# Patient Record
Sex: Female | Born: 1935
Health system: Southern US, Community
[De-identification: ages and names within clinical notes are randomized; demographics above are authoritative.]

## PROBLEM LIST (undated history)

## (undated) DIAGNOSIS — M1990 Unspecified osteoarthritis, unspecified site: Secondary | ICD-10-CM

## (undated) DIAGNOSIS — I639 Cerebral infarction, unspecified: Secondary | ICD-10-CM

## (undated) DIAGNOSIS — F329 Major depressive disorder, single episode, unspecified: Secondary | ICD-10-CM

## (undated) DIAGNOSIS — E7800 Pure hypercholesterolemia, unspecified: Secondary | ICD-10-CM

## (undated) DIAGNOSIS — I10 Essential (primary) hypertension: Secondary | ICD-10-CM

## (undated) DIAGNOSIS — E039 Hypothyroidism, unspecified: Secondary | ICD-10-CM

## (undated) DIAGNOSIS — G459 Transient cerebral ischemic attack, unspecified: Secondary | ICD-10-CM

## (undated) HISTORY — DX: Unspecified osteoarthritis, unspecified site: M19.90

## (undated) HISTORY — DX: Hypothyroidism, unspecified: E03.9

## (undated) HISTORY — DX: Pure hypercholesterolemia, unspecified: E78.00

## (undated) HISTORY — DX: Essential (primary) hypertension: I10

## (undated) HISTORY — PX: DEBRIDEMENT AND CLOSURE WOUND: SHX5614

## (undated) HISTORY — DX: Cerebral infarction, unspecified: I63.9

## (undated) HISTORY — DX: Major depressive disorder, single episode, unspecified: F32.9

---

## 1990-02-24 HISTORY — PX: CATARACT EXTRACTION: SUR2

## 1993-02-24 HISTORY — PX: CATARACT EXTRACTION: SUR2

## 2001-05-18 ENCOUNTER — Other Ambulatory Visit: Admission: RE | Admit: 2001-05-18 | Discharge: 2001-05-18 | Payer: Self-pay | Admitting: Internal Medicine

## 2005-01-08 ENCOUNTER — Emergency Department (HOSPITAL_COMMUNITY): Admission: EM | Admit: 2005-01-08 | Discharge: 2005-01-08 | Payer: Self-pay | Admitting: Emergency Medicine

## 2005-01-10 ENCOUNTER — Inpatient Hospital Stay (HOSPITAL_COMMUNITY): Admission: EM | Admit: 2005-01-10 | Discharge: 2005-01-15 | Payer: Self-pay | Admitting: Emergency Medicine

## 2008-12-18 ENCOUNTER — Emergency Department (HOSPITAL_COMMUNITY): Admission: EM | Admit: 2008-12-18 | Discharge: 2008-12-18 | Payer: Self-pay | Admitting: Family Medicine

## 2009-10-24 ENCOUNTER — Ambulatory Visit: Payer: Self-pay | Admitting: Cardiology

## 2010-02-22 ENCOUNTER — Ambulatory Visit: Payer: Self-pay | Admitting: Cardiology

## 2010-02-27 ENCOUNTER — Ambulatory Visit: Payer: Self-pay | Admitting: Cardiology

## 2010-03-01 ENCOUNTER — Encounter
Admission: RE | Admit: 2010-03-01 | Discharge: 2010-03-01 | Payer: Self-pay | Source: Home / Self Care | Attending: Cardiology | Admitting: Cardiology

## 2010-05-22 ENCOUNTER — Observation Stay (HOSPITAL_COMMUNITY): Payer: Medicare Other

## 2010-05-22 ENCOUNTER — Inpatient Hospital Stay (INDEPENDENT_AMBULATORY_CARE_PROVIDER_SITE_OTHER)
Admission: RE | Admit: 2010-05-22 | Discharge: 2010-05-22 | Disposition: A | Discharge status: Home / Self Care | Payer: Medicare Other | Source: Ambulatory Visit | Attending: Family Medicine | Admitting: Family Medicine

## 2010-05-22 ENCOUNTER — Emergency Department (HOSPITAL_COMMUNITY): Payer: Medicare Other

## 2010-05-22 ENCOUNTER — Inpatient Hospital Stay (HOSPITAL_COMMUNITY)
Admission: EM | Admit: 2010-05-22 | Discharge: 2010-05-24 | DRG: 312 | Disposition: A | Discharge status: Home / Self Care | Payer: Medicare Other | Source: Ambulatory Visit | Attending: Internal Medicine | Admitting: Internal Medicine

## 2010-05-22 ENCOUNTER — Telehealth: Payer: Self-pay | Admitting: *Deleted

## 2010-05-22 DIAGNOSIS — E039 Hypothyroidism, unspecified: Secondary | ICD-10-CM | POA: Diagnosis present

## 2010-05-22 DIAGNOSIS — R55 Syncope and collapse: Principal | ICD-10-CM | POA: Diagnosis present

## 2010-05-22 DIAGNOSIS — E875 Hyperkalemia: Secondary | ICD-10-CM | POA: Diagnosis present

## 2010-05-22 DIAGNOSIS — N390 Urinary tract infection, site not specified: Secondary | ICD-10-CM | POA: Diagnosis present

## 2010-05-22 DIAGNOSIS — F329 Major depressive disorder, single episode, unspecified: Secondary | ICD-10-CM | POA: Diagnosis present

## 2010-05-22 DIAGNOSIS — M1990 Unspecified osteoarthritis, unspecified site: Secondary | ICD-10-CM | POA: Diagnosis present

## 2010-05-22 DIAGNOSIS — N179 Acute kidney failure, unspecified: Secondary | ICD-10-CM | POA: Diagnosis present

## 2010-05-22 DIAGNOSIS — I10 Essential (primary) hypertension: Secondary | ICD-10-CM | POA: Diagnosis present

## 2010-05-22 DIAGNOSIS — Z9181 History of falling: Secondary | ICD-10-CM

## 2010-05-22 DIAGNOSIS — F3289 Other specified depressive episodes: Secondary | ICD-10-CM | POA: Diagnosis present

## 2010-05-22 DIAGNOSIS — T46905A Adverse effect of unspecified agents primarily affecting the cardiovascular system, initial encounter: Secondary | ICD-10-CM | POA: Diagnosis present

## 2010-05-22 DIAGNOSIS — N289 Disorder of kidney and ureter, unspecified: Secondary | ICD-10-CM | POA: Diagnosis present

## 2010-05-22 DIAGNOSIS — K5900 Constipation, unspecified: Secondary | ICD-10-CM | POA: Diagnosis present

## 2010-05-22 LAB — DIFFERENTIAL
Basophils Absolute: 0 10*3/uL (ref 0.0–0.1)
Basophils Relative: 0 % (ref 0–1)
Eosinophils Absolute: 0 10*3/uL (ref 0.0–0.7)
Eosinophils Relative: 0 % (ref 0–5)
Lymphocytes Relative: 9 % — ABNORMAL LOW (ref 12–46)
Lymphs Abs: 1.1 10*3/uL (ref 0.7–4.0)
Monocytes Absolute: 0.9 10*3/uL (ref 0.1–1.0)
Monocytes Relative: 8 % (ref 3–12)
Neutro Abs: 10.1 10*3/uL — ABNORMAL HIGH (ref 1.7–7.7)
Neutrophils Relative %: 84 % — ABNORMAL HIGH (ref 43–77)

## 2010-05-22 LAB — TROPONIN I: Troponin I: 0.01 ng/mL (ref 0.00–0.06)

## 2010-05-22 LAB — CARDIAC PANEL(CRET KIN+CKTOT+MB+TROPI)
Relative Index: INVALID (ref 0.0–2.5)
Total CK: 58 U/L (ref 7–177)

## 2010-05-22 LAB — URINE MICROSCOPIC-ADD ON

## 2010-05-22 LAB — BASIC METABOLIC PANEL
BUN: 29 mg/dL — ABNORMAL HIGH (ref 6–23)
CO2: 21 mEq/L (ref 19–32)
Calcium: 9.9 mg/dL (ref 8.4–10.5)
Chloride: 101 mEq/L (ref 96–112)
Creatinine, Ser: 1.48 mg/dL — ABNORMAL HIGH (ref 0.4–1.2)
GFR calc non Af Amer: 34 mL/min — ABNORMAL LOW (ref >60)
Glucose, Bld: 98 mg/dL (ref 70–99)
Potassium: 5.2 mEq/L — ABNORMAL HIGH (ref 3.5–5.1)
Sodium: 133 mEq/L — ABNORMAL LOW (ref 135–145)

## 2010-05-22 LAB — CBC
HCT: 37.2 % (ref 36.0–46.0)
Hemoglobin: 12.5 g/dL (ref 12.0–15.0)
MCH: 29.3 pg (ref 26.0–34.0)
MCHC: 33.6 g/dL (ref 30.0–36.0)
MCV: 87.3 fL (ref 78.0–100.0)
RDW: 13.5 % (ref 11.5–15.5)
WBC: 12.1 10*3/uL — ABNORMAL HIGH (ref 4.0–10.5)

## 2010-05-22 LAB — URINALYSIS, ROUTINE W REFLEX MICROSCOPIC
Bilirubin Urine: NEGATIVE
Glucose, UA: NEGATIVE mg/dL
Hgb urine dipstick: NEGATIVE
Nitrite: NEGATIVE
Protein, ur: NEGATIVE mg/dL
Specific Gravity, Urine: 1.018 (ref 1.005–1.030)
Urobilinogen, UA: 0.2 mg/dL (ref 0.0–1.0)
pH: 5 (ref 5.0–8.0)

## 2010-05-22 NOTE — Telephone Encounter (Signed)
Patient sister-in-law phoned stating that patient had an "episode".  Patient got up and ate breakfast went to bedroom to get dressed.  Was putting on her pants and doesn't really know what happened then.  Asked if she had any pains, shortness of breath, or anything else, denied.  Advised she needed to go to emergency room/urgent care (per Lawson Fiscal).  Advised sister-in-law to call back if she refused to go

## 2010-05-22 NOTE — Telephone Encounter (Signed)
Agree 

## 2010-05-23 ENCOUNTER — Other Ambulatory Visit: Payer: Self-pay | Admitting: Internal Medicine

## 2010-05-23 DIAGNOSIS — R55 Syncope and collapse: Secondary | ICD-10-CM

## 2010-05-23 LAB — CBC
HCT: 34.1 % — ABNORMAL LOW (ref 36.0–46.0)
Hemoglobin: 11.3 g/dL — ABNORMAL LOW (ref 12.0–15.0)
MCH: 28.8 pg (ref 26.0–34.0)
MCHC: 33.1 g/dL (ref 30.0–36.0)
MCV: 87 fL (ref 78.0–100.0)
RBC: 3.92 MIL/uL (ref 3.87–5.11)
RDW: 13.4 % (ref 11.5–15.5)

## 2010-05-23 LAB — BASIC METABOLIC PANEL
BUN: 24 mg/dL — ABNORMAL HIGH (ref 6–23)
CO2: 22 mEq/L (ref 19–32)
Calcium: 9.6 mg/dL (ref 8.4–10.5)
Chloride: 101 mEq/L (ref 96–112)
Creatinine, Ser: 1.25 mg/dL — ABNORMAL HIGH (ref 0.4–1.2)
GFR calc Af Amer: 49 mL/min — ABNORMAL LOW (ref >60)
GFR calc non Af Amer: 41 mL/min — ABNORMAL LOW (ref >60)
Glucose, Bld: 117 mg/dL — ABNORMAL HIGH (ref 70–99)
Potassium: 4 mEq/L (ref 3.5–5.1)
Sodium: 137 mEq/L (ref 135–145)

## 2010-05-23 LAB — MAGNESIUM: Magnesium: 2.1 mg/dL (ref 1.5–2.5)

## 2010-05-23 LAB — CARDIAC PANEL(CRET KIN+CKTOT+MB+TROPI)
CK, MB: 2.3 ng/mL (ref 0.3–4.0)
CK, MB: 2.1 ng/mL (ref 0.3–4.0)
Relative Index: INVALID (ref 0.0–2.5)
Total CK: 77 U/L (ref 7–177)
Troponin I: 0.03 ng/mL (ref 0.00–0.06)
Troponin I: 0.02 ng/mL (ref 0.00–0.06)

## 2010-05-23 LAB — URINE CULTURE
Colony Count: 7000
Culture  Setup Time: 201203282059

## 2010-05-23 LAB — TSH: TSH: 1.837 u[IU]/mL (ref 0.350–4.500)

## 2010-05-24 ENCOUNTER — Other Ambulatory Visit: Payer: Self-pay | Admitting: Internal Medicine

## 2010-05-24 DIAGNOSIS — I059 Rheumatic mitral valve disease, unspecified: Secondary | ICD-10-CM

## 2010-05-24 LAB — BASIC METABOLIC PANEL
Calcium: 9.3 mg/dL (ref 8.4–10.5)
Creatinine, Ser: 0.93 mg/dL (ref 0.4–1.2)
GFR calc Af Amer: >60 mL/min (ref >60)
GFR calc non Af Amer: 57 mL/min — ABNORMAL LOW (ref >60)
Sodium: 138 mEq/L (ref 135–145)

## 2010-05-24 LAB — CBC
HCT: 31.6 % — ABNORMAL LOW (ref 36.0–46.0)
Hemoglobin: 10.4 g/dL — ABNORMAL LOW (ref 12.0–15.0)
MCH: 28.8 pg (ref 26.0–34.0)
MCHC: 32.9 g/dL (ref 30.0–36.0)
MCV: 87.5 fL (ref 78.0–100.0)
Platelets: 144 10*3/uL — ABNORMAL LOW (ref 150–400)
RBC: 3.61 MIL/uL — ABNORMAL LOW (ref 3.87–5.11)
RDW: 13.5 % (ref 11.5–15.5)
WBC: 6 10*3/uL (ref 4.0–10.5)

## 2010-05-27 NOTE — H&P (Signed)
NAME:  Danielle Bryan, Danielle Bryan                  ACCOUNT NO.:  [id number]  MEDICAL RECORD NO.:  [id number]          PATIENT TYPE:  OBV  LOCATION:                               FACILITY:  [facility]  PHYSICIAN:  Eduard Clos, MDDATE OF BIRTH:  [birth year]  DATE OF ADMISSION: DATE OF DISCHARGE:                             HISTORY & PHYSICAL   PRIMARY CARE PHYSICIAN:  Cassell Clement, MD.  CHIEF COMPLAINT:  Loss of consciousness.  HISTORY OF PRESENT ILLNESS:  An 77 year old female with history of hypothyroidism, hypertension, and depression.  Had sudden syncopal spell around 2:30 this afternoon.  The patient states that she was doing fine. She has been having some constipation and was straining to move her bowels.  Today after she did move her bowels around afternoon and went to sit in the bedroom and was putting the socks, suddenly she felt unconscious.  She states it may be for a few minutes.  She did not have any incontinence of urine or tongue bite.  Did not have any seizure-like activities.  Denies any focal deficit, headache, or visual symptoms prior or after the incident.  Denies any chest pain or shortness of breath.  Denies any nausea, vomiting, abdominal pain, dysuria, discharge, or diarrhea. At this time, the patient has been admitted for syncope workup.  Presently, her creatinine is slightly high and potassium was also mildly high.  Her troponins are negative.  The patient has mild leukocytosis with features of UTI.  PAST MEDICAL HISTORY:  Hypertension, hypothyroidism, depression.  PAST SURGICAL HISTORY:  None except for a D and C in 58s.  FAMILY HISTORY:  Significant for CHF and angina in her mom.  SOCIAL HISTORY:  The patient is married, lives with her husband.  She is a full code.  Denies smoking cigarettes, drinking alcohol, use illegal drugs.  REVIEW OF SYSTEMS:  As per the history of presenting illness, nothing else significant.  PHYSICAL EXAMINATION:  GENERAL:  The  patient was examined at the bedside, not in acute distress. VITAL SIGNS:  Blood pressure is 125/70, pulse is 70 per minute, temperature 97.7, respirations 18 per minute, O2 sat 100%. HEENT:  Anicteric.  No pallor.  No facial asymmetry.  I do not see any scab lesions or laceration.  No neck rigidity.  PERRLA positive.  Tongue is midline. CHEST:  Bilateral air entry present.  No rhonchi.  No crepitation. HEART:  S1 and S2 heard. ABDOMEN:  Soft, nontender.  Bowel sounds heard. CNS:  Awake, alert, and oriented to time, place, and person.  Moves upper and lower extremities 5/5. EXTREMITIES:  Peripheral pulse was felt.  There is a small band-aid on the right knee from a recent corticosteroid injection for osteoarthritis.  LABS:  EKG shows normal sinus rhythm with a heart rate around 66 beats per minute with nonspecific ST-T changes.  QTc is 400 milliseconds. CBC:  WBC is 12.1, hemoglobin is 12.5, hematocrit is 37.2, platelets 167, neutrophils 84%.  Basic metabolic panel:  Sodium 133, potassium 5.2, chloride 101, carbon dioxide 21, glucose 98, BUN is 29, creatinine 1.4, calcium 9.9, troponin 0.01.  UA  shows cloudy, nitrites negative, leukocytes moderate, squamous cells few, hyaline casts, wbc 7-10, bacteria few, mucus present.  Chest x-ray, no active cardiopulmonary disease.  ASSESSMENT: 1. Syncope. 2. Urinary tract infection. 3. Renal failure with mild hyperkalemia. 4. History of hypertension. 5. History of hypothyroidism. 6. History of osteoarthritis of the right knee, status post recent     corticosteroid injection.  PLAN: 1. At this time, we will admit the patient to telemetry to rule out     any arrhythmias. 2. For her syncope at this time, we are going to check orthostatics in     a.m.  Get 2-D echo.  We will get a CT head, serum cardiac markers.     We are going to gently hydrate the patient. 3. Renal failure, probably acute.  We are going to gently hydrate the     patient.   At this time, we are going to hold the lisinopril as the     patient also has mild hyperkalemia.  We will follow strict intake     output.  If her creatinine worsens, then we need to get a sonogram     of abdomen; check urine sodium, creatinine, and eosinophils.  At     this time, for her blood pressure, I am going to keep the patient     on p.r.n. hydralazine.  If the hyperkalemia worsens, then the     patient may not be a candidate for continuing lisinopril; we have     to consider some other antihypertensive. 4. Hypothyroidism.  We will check TSH.  Continue her present Synthroid     dose. 5. For her UTI, we will get urine culture.  We will continue with     ceftriaxone. 6. Further recommendation as condition evolves and based on the test     orders.     Eduard Clos, MD     ANK/MEDQ  D:  05/22/2010  T:  05/22/2010  Job:  [id number]  cc:   Cassell Clement, M.D.  Electronically Signed by Midge Minium MD on 05/27/2010 08:26:04 AM

## 2010-05-29 NOTE — Discharge Summary (Signed)
NAMEFERRIN, LIEBIG                  ACCOUNT NO.:  [id number]  MEDICAL RECORD NO.:  [id number]           PATIENT TYPE:  O  LOCATION:  2009                         FACILITY:  [facility]  PHYSICIAN:  Thad Ranger, MD       DATE OF BIRTH:  1923/03/10  DATE OF ADMISSION:  05/22/2010 DATE OF DISCHARGE:                        DISCHARGE SUMMARY - REFERRING   PRIMARY CARE PHYSICIAN:  Cassell Clement, MD  DISCHARGE DIAGNOSES: 1. Syncope likely vasovagal and dehydration. 2. Mild acute renal insufficiency with mild hyperkalemia, likely     secondary to lisinopril. 3. Hypertension. 4. Hypothyroidism. 5. History of osteoarthritis.  DISCHARGE MEDICATIONS: 1. Norvasc 5 mg p.o. daily. 2. Aspirin 81 mg p.o. daily. 3. Levothyroxine 100 mcg on Monday, Tuesday, Wednesday, Thursday,     Friday, and Saturday. 4. Protonix 40 mg p.o. q.a.m. 5. Sertraline 25 mg p.o. b.i.d. 6. Vitamin D3 1000 units p.o. daily.  The patient was advised to stop taking the following medication which include lisinopril.  BRIEF HISTORY OF PRESENT ILLNESS:  At the time of admission, Danielle Bryan is 77 year old female with history of hypothyroidism, hypertension, and depression fell around on the day of admission around 2:30 p.m.  The patient stated that she was doing fine.  She had been having some constipation, was straining to move her bowels.  After she did move her bowels around afternoon, went to sit in the bedroom and was pulling the shocks and she felt unconscious.  She states that it may be for a few minutes.  She did not have any incontinence of urine or tongue bite, did not have any seizure-like activities.  Denied any focal deficits, headache, or visual symptoms.  Prior or off to the incident, denied any chest pain or shortness of breath.  RADIOLOGICAL DATA:  Chest x-ray March 28, no active cardiopulmonary disease.  CT head without contrast March 29, trophy with minimal small vessel chronic ischemic changes of  deep cerebral white matter.  No acute intracranial abnormalities.  A 2-D echo on March 30 showed EF of 60-65% normal wall motion.  No regional wall motion abnormalities, grade 1 diastolic dysfunction.  Carotid Doppler's bilaterally on March 29 showed no ICA stenosis.  BRIEF HOSPITALIZATION COURSE:  [name] is an 77 year old female who was admitted with syncopal episode. 1. Syncopal episode likely vasovagal.  The patient was admitted to the     tele monitor unit and ruled out for any acute arrhythmias.  The     patient had uneventful hospitalization and did extremely well.  She     was noted to have mild hyperkalemia with a potassium of 5.2 and     acute renal insufficiency with creatinine of 1.4 at the time of     admission.  She was gently hydrated with IV fluids and lisinopril     was held.  At the time of discharge, potassium improved to 4.8 and     creatinine 0.93.  The patient was advised to stop taking     lisinopril.  Urine culture had shown 7000 colonies insignificant     growth.  The patient had received  3 doses of ceftriaxone during    hospitalization. 2. Hyperthyroidism.  TSH was within normal range of 1.8.  The patient     was continued on Synthroid. 3. Depression.  The patient did well and was continued on sertraline. 4. Hypertension.  Lisinopril was discontinued and the patient was     started on 5 mg of Norvasc, she did well. 5. PT/OT evaluation was done prior to discharge and the patient did     extremely well and does not need any OT or PT needs at home.  DISCHARGE PHYSICAL EXAMINATION:  VITAL SIGNS:  Temperature 97.9, pulse 74, respirations 18, O2 sat 98% room air, blood pressure 119/59. GENERAL:  The patient is alert, awake and oriented x3 not in acute distress. HEENT:  Negative sclerae and conjunctivae.  Pupils reactive to light and accommodation.  EOMI. NECK:  Supple.  No lymphopathy, no JVD. CARDIOVASCULAR:  S1, S2 clear. CHEST:  Clear to auscultation  bilaterally. ABDOMEN:  Soft, nontender, nondistended.  Normal bowel sounds. EXTREMITIES:  No cyanosis, clubbing, or edema noted in upper or lower extremities bilaterally. NEURO:  No focal neurological deficits noted.  The patient is discharged.  Follow up with Dr. Cassell Clement within next 2-3 weeks.  DISCHARGE TIME:  35 minutes.     Thad Ranger, MD     RR/MEDQ  D:  05/24/2010  T:  05/24/2010  Job:  [id number]  cc:   Cassell Clement, M.D.  Electronically Signed by Andres Labrum RAI  on 05/29/2010 04:45:11 PM

## 2010-06-06 ENCOUNTER — Telehealth: Payer: Self-pay | Admitting: Cardiology

## 2010-06-06 NOTE — Telephone Encounter (Signed)
Requesting copies of the records of the testing that was done at the hospital and discharge summary, cat scan,echo, ultrasound, doppers of arteries. I have pulled the chart.

## 2010-06-08 ENCOUNTER — Other Ambulatory Visit: Payer: Self-pay | Admitting: Cardiology

## 2010-06-08 DIAGNOSIS — K219 Gastro-esophageal reflux disease without esophagitis: Secondary | ICD-10-CM

## 2010-06-10 NOTE — Telephone Encounter (Signed)
Refill request

## 2010-06-14 ENCOUNTER — Encounter: Payer: Self-pay | Admitting: Cardiology

## 2010-06-21 ENCOUNTER — Telehealth: Payer: Self-pay | Admitting: Cardiology

## 2010-06-21 NOTE — Telephone Encounter (Signed)
Wanted copies of her ultra sound, CT scan, dopplers of the corodid, and the echo, and a copy of the discharge summary from the hospital.

## 2010-07-12 NOTE — H&P (Signed)
Danielle Bryan, Danielle Bryan                  ACCOUNT NO.:  [id number]   MEDICAL RECORD NO.:  [id number]          PATIENT TYPE:  INP   LOCATION:  1845                         FACILITY:  [facility]   PHYSICIAN:  Cassell Clement, M.D. DATE OF BIRTH:  1923-09-11   DATE OF ADMISSION:  01/10/2005  DATE OF DISCHARGE:                                HISTORY & PHYSICAL   CHIEF COMPLAINT:  Recurrent syncope.   HISTORY:  This is a 77 year old married Caucasian female known to our  practice. She has been in good general health all of her life. Recently, she  has been under increased stress. Her husband, who suffers from diabetes and  dementia, was hospitalized several days ago at [facility] under the  hospitalist service because of a probable viral illness with  gastroenteritis. Two days ago while Danielle Bryan was up helping to feed her  husband lunch in the hospital, she became dizzy and had syncope. She was  brought to the emergency room where she was evaluated, given IV fluids, and  a CBC was checked and she was released home. The following morning, the  patient had syncope again and was taken to our office where we checked her  and her vital signs were stable, CBC was stable, electrolytes stable except  for sodium of 127, and her blood pressure was 120/60. She had been on Diovan  HCT on a daily basis for blood pressure and we instructed her to stop the  Diovan HCT and to force fluids, and to eat salt and drink Gatorade. She went  home and slept well last night, and then this morning shortly after being  awakened from sleep she had two more episodes of syncope according to the  son, who then brought her to the emergency room where she was evaluated and  subsequently admitted. She had had a large diarrheal stool prior to her  initial episode of syncope. Since then, she has had no further significant  diarrheal stool but did have questionable small amount of loose stool  incontinently during the night last  night. The patient denies any abdominal  pain or chest pain. She is not having any shortness of breath. She does  complain of just extreme exhaustion and fatigue.   PAST HISTORY:  Positive for essential hypertension and hyperlipidemia and  hypothyroidism. Present medications had been Diovan HCT which is now on  hold, and she is also on Synthroid 0.1 mg 6 days a week and that was  recently reduced to 5 days a week. She has Protonix on hand for p.r.n. use.   FAMILY HISTORY:  Reveals that her father died at 72 of heart failure. Mother  died of 70 of heart failure and pneumonia.   SOCIAL HISTORY:  Reveals that she retired as the [employer] for [employer] last year.   She has had fairly unremarkable past medical history. She did have a D&C in  1951 by Dr. Shea Evans. She has had no other significant health problems except as  noted above. She is allergic to SULFA. She does not use tobacco and very  rarely drinks any alcohol. She limits her caffeine.   REVIEW OF SYSTEMS:  Otherwise unremarkable. She has not had any cough or  sputum production and she is not having any urinary tract symptoms.   PHYSICAL EXAMINATION:  VITAL SIGNS:  Are as recorded.  GENERAL APPEARANCE:  Reveals a pale-appearing elderly Caucasian female in no  acute distress. She was napping when I entered the room.  HEENT:  She has normal HEENT exam. Her conjunctiva are slightly pale but  there is no scleral icterus. Mouth and pharynx are normal.  NECK:  The thyroid is normal. There is no lymphadenopathy. Carotids reveal  no bruits.  CHEST:  Clear to percussion and auscultation.  HEART:  Reveals no murmur, gallop, rub or click.  BREASTS:  Not examined.  ABDOMEN:  Soft and nontender. Bowel sounds are present.  EXTREMITIES:  Show no phlebitis or edema.  NEUROLOGIC:  Exam is physiologic.   Orthostatic vital signs include a blood pressure lying of 127/94, sitting of  134/62. Her white count is 8600; hemoglobin 11.9;  hematocrit 34.2; platelets  153,000. Neutrophils are 92%. Sodium is 127, potassium 3.2, BUN 19, blood  sugar 118, creatinine 1.2. Urinalysis is unremarkable. No evidence of  urinary infection with negative nitrite, although many bacteria are present.  Comprehensive metabolic panel included normal liver function studies. She  had a troponin I which was normal. Her total CK is elevated at 592 but MB is  1.6.   Her electrocardiogram done in our office is normal. Her chest x-ray done  here today is unremarkable.   IMPRESSION:  1.  Recurrent syncope.  2.  Hyponatremia.  3.  Mild anemia.  4.  Situational stress with her husband in the hospital.  5.  Compensated hypothyroidism.   DISPOSITION:  We are admitting for IV fluids, correction of electrolyte  imbalance, supplementation of potassium, and observation on telemetry. Will  also want to follow up on the elevated total CK with a normal CK-MB.           ______________________________  Cassell Clement, M.D.     TB/MEDQ  D:  01/10/2005  T:  01/10/2005  Job:  [id number]

## 2010-07-12 NOTE — Discharge Summary (Signed)
NAMEAJOONI, KARAM                  ACCOUNT NO.:  [id number]   MEDICAL RECORD NO.:  [id number]          PATIENT TYPE:  INP   LOCATION:  2033                         FACILITY:  [facility]   PHYSICIAN:  Cassell Clement, M.D. DATE OF BIRTH:  1923-04-23   DATE OF ADMISSION:  01/10/2005  DATE OF DISCHARGE:  01/15/2005                                 DISCHARGE SUMMARY   FINAL DIAGNOSES:  1.  Syncope.  2.  Hyposmolarity.  3.  Hypertension.  4.  Hyperlipidemia.  5.  Anemia.  6.  Hypothyroidism.  7.  Malaise and fatigue.  8.  Depression.   OPERATIONS PERFORMED:  None.   HISTORY:  This is a 77 year old Caucasian female known to our practice. She  had been in good general health most of her life and was admitted because of  recurrent syncope January 10, 2005. Recently, she has been under increased  stress. Two days prior to this admission, the patient was helping to feed  her husband who is ill and she became dizzy and had syncope and was brought  to the emergency room where she was evaluated, given IV fluids, had a CBC  checked which was normal and she was released home. The following morning,  the patient had another episode of syncope, was brought to our office were  we checked her and her vital signs were stable. CBC was stable except for a  hyponatremia with a sodium of 127. Her blood pressure was 120/60. She was  advised to stop taking her Diovan/HCT and was advised to force fluids, eat  salt and drink some Gatorade. She went home, slept well and then this  morning shortly after being awakened she had two more episodes of syncope  according to the son who brought her to the emergency room where she was  evaluated and admitted. Prior to the syncopal episode, she had a large  diarrheal stool, but since then has had no further diarrheal stools. She is  not having any shortness of breath but she complains of extreme exhaustion  and fatigue.   MEDICATIONS PRIOR TO THIS ADMISSION:   Included Diovan/HCT which had been on  hold for one day and she has been on Synthroid 0.1 milligrams 6 days a week  and Protonix on hand for p.r.n. use.   PHYSICAL EXAMINATION:  Reveals vital signs as recorded. This is a pale-  appearing elderly woman in no distress. HEENT exam unremarkable. Thyroid  normal. Carotids no bruits. Chest is clear to percussion and auscultation.  The heart reveals no murmur, gallop, rub or click. Abdomen is soft and  nontender. Bowel sounds were present. Extremities show no phlebitis or  edema. Neurologic exam is physiologic. Her orthostatic vital signs included  blood pressure lying of 127/94, sitting 134/62.   On admission, her sodium is 127, potassium 3.2, BUN 19, current blood sugar  118, creatinine 1.2. Her urinalysis is unremarkable with negative nitrite,  although many bacteria are present. Her liver function studies are normal  and her total CK was elevated at 592 but MB is 1.6.   HOSPITAL COURSE:  She was admitted for IV fluids and correction of  electrolyte imbalance, supplemental of potassium and observation on  telemetry because of the recurrent episodes of syncope. We will also want  follow-up on the elevated total CK with a normal CK-MB.   The patient remained stable on telemetry. By January 11, 2005, she was  still hyponatremic with a sodium of 128 and a potassium down to 2.9. Her  total CK showed a gradual fall from 592-447 by the following day. The stools  were cultured for C. difficile to evaluate for the diarrhea. The question of  gallbladder disease was raised and an ultrasound was ordered. It was felt  that the patient was probably depressed and stated that she was intolerant  to Lexapro and we gave her a trial of Zoloft. The patient by January 13, 2005 appeared to be slowly improving after rehydration and her weight was  increased eight pounds since admission. Hemoglobin fell to 10.2 after being  12.6 on admission, after she was  rehydrated. Therefore, because of the  anemia, we checked her stool for occult blood. Her BUN dropped to three  consistent with IV saline infusion and poor protein intake. Her serum  albumin was noted to be low at 2.6. We added Ensure and asked the  nutritionist to see and make recommendations. The CT of the abdomen and  pelvis were done because of the weight loss, malaise and fatigue. A CT of  the abdomen and pelvis were negative for malignancy.   By January 14, 2005, the patient was feeling stronger. Blood pressure was  up to 163/84. The patient had further diarrhea twice on the morning of  January 14, 2005 and C. difficile assay was requested. The stool for occult  blood came back normal. Serum iron, TIBC, folate, B12 levels were all okay  and thyroid function was normal. By January 15, 2005, she was strong enough  to be discharged home. The patient was seen by physical therapy prior to  discharge and they recommended home health physical therapy follow-up.   By January 15, 2005, the patient was stable enough to be discharged home.   DISCHARGE MEDICATIONS:  1.  Ferrous sulfate 325 one daily.  2.  Coated aspirin 81 milligrams daily.  3.  Zoloft 25 milligrams daily.  4.  Therapeutic multivitamin one daily.  5.  Ensure 1 can daily.  6.  Tylenol as needed for pain.  7.  Synthroid 100 mcg daily except none on Wednesday and Sunday.   She will see Dr. Patty Sermons in two weeks for office visit, CBC and CMET.   CONDITION ON DISCHARGE:  Improved. She was advised to use a cane for  balance. She has no dietary restriction.           ______________________________  Cassell Clement, M.D.     TB/MEDQ  D:  02/27/2005  T:  02/27/2005  Job:  [id number]

## 2010-08-14 ENCOUNTER — Telehealth: Payer: Self-pay | Admitting: Cardiology

## 2010-08-14 NOTE — Telephone Encounter (Signed)
Called in today wanting to speak with Danielle Bryan or Danielle Bryan. Did not with to disclose the nature of the call to me at first, but later, expressed her displeasure with not being contacted for her next appointment and not being able to receive her hospital records from either [place] or the hospital.  Despite my advice to continue to request her hospital records from the hospital and attempt to schedule her next appointment, she said that it would be in her best interest to speak with either Danielle Bryan or [name] first. Please call back. Also requested that I pull her chart to go along with her note. I have pulled her chart.

## 2010-08-16 ENCOUNTER — Encounter: Payer: Self-pay | Admitting: *Deleted

## 2010-08-16 DIAGNOSIS — M1990 Unspecified osteoarthritis, unspecified site: Secondary | ICD-10-CM | POA: Insufficient documentation

## 2010-08-16 DIAGNOSIS — F329 Major depressive disorder, single episode, unspecified: Secondary | ICD-10-CM | POA: Insufficient documentation

## 2010-08-16 DIAGNOSIS — E7800 Pure hypercholesterolemia, unspecified: Secondary | ICD-10-CM | POA: Insufficient documentation

## 2010-08-16 DIAGNOSIS — E039 Hypothyroidism, unspecified: Secondary | ICD-10-CM | POA: Insufficient documentation

## 2010-08-20 ENCOUNTER — Encounter: Payer: Self-pay | Admitting: Cardiology

## 2010-08-20 ENCOUNTER — Ambulatory Visit (INDEPENDENT_AMBULATORY_CARE_PROVIDER_SITE_OTHER): Payer: Medicare Other | Admitting: Cardiology

## 2010-08-20 DIAGNOSIS — F341 Dysthymic disorder: Secondary | ICD-10-CM

## 2010-08-20 DIAGNOSIS — F329 Major depressive disorder, single episode, unspecified: Secondary | ICD-10-CM

## 2010-08-20 DIAGNOSIS — E7800 Pure hypercholesterolemia, unspecified: Secondary | ICD-10-CM

## 2010-08-20 DIAGNOSIS — I119 Hypertensive heart disease without heart failure: Secondary | ICD-10-CM

## 2010-08-20 NOTE — Progress Notes (Signed)
Danielle Bryan Date of Birth:  1923-09-01 [facility] Cardiology / [facility] [address]   [address] [place], [place]  [address] [phone]           Fax   [phone]  History of Present Illness: This pleasant 77 year old woman is seen for a scheduled followup office visit.  She has a past history of essential hypertension and hypercholesterolemia and hypothyroidism.  Recently she has been incapacitated by symptoms of depression.  Her husband died 10 months ago.  He had been sick for many years and she devoted her life to looking after him and caring for his medical problems.  Patient is having a lot of crying spells and difficulty getting motivated to do anything.  She does sleep okay during the night.  He was hospitalized in March 2012 with syncope and dehydration.  During that admission she had normal carotid Dopplers, normal tone a two-dimensional echocardiogram, and normal CT head scan.  Her lisinopril was stopped her in admission.  He has not been expressing any chest discomfort or shortness of breath.  She has occasional ankle edema which goes down overnight.  She does have chronic pain in her right knee and has seen Dr. Yisroel Ramming.  Current Outpatient Prescriptions  Medication Sig Dispense Refill  . amLODipine (NORVASC) 5 MG tablet 5 mg daily.       Marland Kitchen aspirin 81 MG tablet Take 81 mg by mouth daily.        . Cholecalciferol (VITAMIN D PO) Take by mouth. Taking 1000 daily       . levothyroxine (SYNTHROID, LEVOTHROID) 100 MCG tablet Take 100 mcg by mouth daily. Taking 6 days a week       . Naproxen Sodium (ALEVE PO) Take by mouth. As needed       . Omega-3 Fatty Acids (FISH OIL PO) Take by mouth 2 (two) times daily.        . pantoprazole (PROTONIX) 40 MG tablet TAKE 1 TABLET BY MOUTH EVERY DAY  30 tablet  8  . sertraline (ZOLOFT) 25 MG tablet Take 25 mg by mouth 2 (two) times daily.        Marland Kitchen DISCONTD: HYDROcodone-acetaminophen (VICODIN) 5-500 MG per tablet Take 1 tablet by mouth every 6  (six) hours as needed.        Marland Kitchen DISCONTD: lisinopril (PRINIVIL,ZESTRIL) 10 MG tablet Take 10 mg by mouth 2 (two) times daily.          Allergies  Allergen Reactions  . Lexapro     Ha,nausea  . Statins     myalgias    Patient Active Problem List  Diagnoses  . Hypothyroidism  . Osteoarthritis  . Hypercholesterolemia  . Reactive depression (situational)  . Benign hypertensive heart disease without heart failure    History  Smoking status  . Never Smoker   Smokeless tobacco  . Not on file    History  Alcohol Use     Family History  Problem Relation Age of Onset  . Heart failure Mother   . Pneumonia Mother   . Heart failure Father     Review of Systems: Constitutional: no fever chills diaphoresis or fatigue or change in weight.  Head and neck: no hearing loss, no epistaxis, no photophobia or visual disturbance. Respiratory: No cough, shortness of breath or wheezing. Cardiovascular: No chest pain peripheral edema, palpitations. Gastrointestinal: No abdominal distention, no abdominal pain, no change in bowel habits hematochezia or melena. Genitourinary: No dysuria, no frequency, no urgency, no  nocturia. Musculoskeletal:No arthralgias, no back pain, no gait disturbance or myalgias. Neurological: No dizziness, no headaches, no numbness, no seizures, no syncope, no weakness, no tremors. Hematologic: No lymphadenopathy, no easy bruising. Psychiatric: No confusion, no hallucinations, no sleep disturbance.    Physical Exam: Filed Vitals:   08/20/10 1430  BP: 150/70  Pulse: 88  The general appearance reveals a elderly woman who appears to be depressed and is on the verge of tears.The head and neck exam reveals pupils equal and reactive.  Extraocular movements are full.  There is no scleral icterus.  The mouth and pharynx are normal.  The neck is supple.  The carotids reveal no bruits.  The jugular venous pressure is normal.  The  thyroid is not enlarged.  There is no  lymphadenopathy.  The chest is clear to percussion and auscultation.  There are no rales or rhonchi.  Expansion of the chest is symmetrical.  The precordium is quiet.  The first heart sound is normal.  The second heart sound is physiologically split.  There is no murmur gallop rub or click.  There is no abnormal lift or heave.The breasts reveal no masses. The abdomen is soft and nontender.  The bowel sounds are normal.  The liver and spleen are not enlarged.  There are no abdominal masses.  There are no abdominal bruits.  Extremities reveal good pedal pulses.  There is no phlebitis or edema.  There is no cyanosis or clubbing.  Strength is normal and symmetrical in all extremities.  There is no lateralizing weakness.  There are no sensory deficits.  The skin is warm and dry.  There is no rash.   Assessment / Plan: Long discussion regarding management of herSymptoms of situational depression.  She declines psychiatric referral at this time.  She will consider her looking into the possibility of a possible move to a place like [place] [facility] Would provided opportunity for socialization.No change in medication at this point.  She knows that if she fails to improve that the invitation for psychiatric referral is always Open.  Recheck in 3 months for followup office visit and lab

## 2010-08-20 NOTE — Assessment & Plan Note (Signed)
Patient has a past history of hypercholesterolemia.She is intolerant of statins because of myalgias.  She's attempting to control her cholesterol with diet and she is avoiding eating meats and cheeses and ice cream etc.

## 2010-08-20 NOTE — Assessment & Plan Note (Signed)
The patient has a past history of essential hypertension.  She previously had been on lisinopril twice a day.  However she was admitted with syncope and dehydration on 05/22/10 and had transient hyperkalemia and transient azotemia with serum creatinine of 1.48 and her lisinopril was stopped and not restarted.  At discharge her BUN was 14 and her creatinine was 0.93.  He has not been experiencing any headaches.  She's not having any history of congestive heart failure

## 2010-08-20 NOTE — Assessment & Plan Note (Signed)
This patient is seen for a followup office visit.  She has a past history of reactive situational depression.  Her husband died 10 months ago.  She has continued to be very depressed.  She is depressed about her financial situation and also about the dark depressing home that she is living in.  She cries for hours at a time.  She stays by herself.  She does not get out and socialize.  She doesn't walk in the neighborhood because of the traffic situation.  She has been on Zoloft without much improvement.  We have tried Lexapro in the past but she did not tolerate it because of headache and nausea.  I told her that I thought that she was depressed and she agreed.  I told her that I thought she should see a psychiatrist and we will be glad to refer her to one there would be a good match for her but she declined referral.  She did agree that she would try to get out of her house more.  I also suggested that she should really consider looking into moving out of that house and selling it and moving to a place like [place] where she would have some people available for companion ship and socialization.  She promised she would look into this.  It may be feasible for her with her financial situation.

## 2010-09-13 ENCOUNTER — Other Ambulatory Visit: Payer: Self-pay | Admitting: Cardiology

## 2010-09-13 DIAGNOSIS — I119 Hypertensive heart disease without heart failure: Secondary | ICD-10-CM

## 2010-09-13 MED ORDER — AMLODIPINE BESYLATE 5 MG PO TABS: 5.0000 mg | ORAL_TABLET | Freq: Every day | ORAL | Status: DC

## 2010-09-13 NOTE — Telephone Encounter (Signed)
PT NEEDS A SUPPLY/REFILL FOR GENERIC NORVASC MAILED TO HER HOME. NO CALL BACK NEEDED UNLESS NURSE NEEDS TO SW PT.

## 2010-09-13 NOTE — Telephone Encounter (Signed)
Mailed to patient

## 2010-09-19 ENCOUNTER — Other Ambulatory Visit: Payer: Self-pay | Admitting: Cardiology

## 2010-09-19 DIAGNOSIS — I119 Hypertensive heart disease without heart failure: Secondary | ICD-10-CM

## 2010-09-19 NOTE — Telephone Encounter (Signed)
Patient was in hosp in march and they changed her medications.  It is time to refill Norvasc generic.  Please call patient about her prescriptions.

## 2010-09-19 NOTE — Telephone Encounter (Signed)
escribe request  

## 2010-11-18 ENCOUNTER — Ambulatory Visit (INDEPENDENT_AMBULATORY_CARE_PROVIDER_SITE_OTHER): Payer: Medicare Other | Admitting: *Deleted

## 2010-11-18 DIAGNOSIS — I10 Essential (primary) hypertension: Secondary | ICD-10-CM

## 2010-11-18 DIAGNOSIS — E785 Hyperlipidemia, unspecified: Secondary | ICD-10-CM

## 2010-11-18 LAB — BASIC METABOLIC PANEL
BUN: 17 mg/dL (ref 6–23)
Calcium: 9.8 mg/dL (ref 8.4–10.5)
GFR: 73.4 mL/min (ref >60.00)
Glucose, Bld: 116 mg/dL — ABNORMAL HIGH (ref 70–99)
Potassium: 4.8 mEq/L (ref 3.5–5.1)
Sodium: 144 mEq/L (ref 135–145)

## 2010-11-18 LAB — LIPID PANEL
HDL: 80 mg/dL (ref >39.00)
Triglycerides: 109 mg/dL (ref 0.0–149.0)

## 2010-11-18 LAB — LDL CHOLESTEROL, DIRECT: Direct LDL: 153.2 mg/dL

## 2010-11-18 LAB — HEPATIC FUNCTION PANEL
ALT: 10 U/L (ref 0–35)
AST: 23 U/L (ref 0–37)
Total Bilirubin: 0.6 mg/dL (ref 0.3–1.2)
Total Protein: 7.4 g/dL (ref 6.0–8.3)

## 2010-11-18 LAB — TSH: TSH: 2.91 u[IU]/mL (ref 0.35–5.50)

## 2010-11-19 ENCOUNTER — Encounter: Payer: Self-pay | Admitting: Cardiology

## 2010-11-19 ENCOUNTER — Ambulatory Visit (INDEPENDENT_AMBULATORY_CARE_PROVIDER_SITE_OTHER): Payer: Medicare Other | Admitting: Cardiology

## 2010-11-19 VITALS — BP 166/78 | HR 80 | Ht 63.0 in | Wt 140.2 lb

## 2010-11-19 DIAGNOSIS — E7800 Pure hypercholesterolemia, unspecified: Secondary | ICD-10-CM

## 2010-11-19 DIAGNOSIS — F329 Major depressive disorder, single episode, unspecified: Secondary | ICD-10-CM

## 2010-11-19 DIAGNOSIS — F341 Dysthymic disorder: Secondary | ICD-10-CM

## 2010-11-19 DIAGNOSIS — I119 Hypertensive heart disease without heart failure: Secondary | ICD-10-CM

## 2010-11-19 DIAGNOSIS — E039 Hypothyroidism, unspecified: Secondary | ICD-10-CM

## 2010-11-19 NOTE — Assessment & Plan Note (Signed)
Patient does have whitecoat hypertension.  Blood pressure here is high today.  He has not been expressing any headaches or dizzy spells she's not having any symptoms of congestive heart failure.

## 2010-11-19 NOTE — Patient Instructions (Signed)
Continue to be as active as possible.  Continue same meds

## 2010-11-19 NOTE — Progress Notes (Signed)
Danielle Bryan Date of Birth:  1923/07/03 [facility] Cardiology / [facility] [address]   [address] [place], [place]  [address] [phone]           Fax   [phone]  HPI: This pleasant 77 year old widowed Caucasian female is seen for a scheduled followup office visit.  She has a past history of essential hypertension and hypercholesterolemia as well as reactive depression.  Her husband died after a long illness just over a year ago.  The patient had had severe depression following his death but does appear to be slightly improved on her present regimen of Zoloft 25 mg twice a day.  She has not had any chest pain or shortness of breath she is not getting any regular exercise because the road where she lives is too heavy with traffic to walk safely.  Current Outpatient Prescriptions  Medication Sig Dispense Refill  . amLODipine (NORVASC) 5 MG tablet TAKE 1 TABLET BY MOUTH EVERY DAY  30 tablet  11  . aspirin 81 MG tablet Take 81 mg by mouth daily.        . Cholecalciferol (VITAMIN D PO) Take by mouth. Taking 1000 daily       . levothyroxine (SYNTHROID, LEVOTHROID) 100 MCG tablet Take 100 mcg by mouth daily. Taking 6 days a week       . Naproxen Sodium (ALEVE PO) Take by mouth. As needed       . Omega-3 Fatty Acids (FISH OIL PO) Take by mouth 2 (two) times daily.        . pantoprazole (PROTONIX) 40 MG tablet TAKE 1 TABLET BY MOUTH EVERY DAY  30 tablet  8  . sertraline (ZOLOFT) 25 MG tablet Take 25 mg by mouth 2 (two) times daily.          Allergies  Allergen Reactions  . Lexapro     Ha,nausea  . Lisinopril   . Statins     myalgias    Patient Active Problem List  Diagnoses  . Hypothyroidism  . Osteoarthritis  . Hypercholesterolemia  . Reactive depression (situational)  . Benign hypertensive heart disease without heart failure    History  Smoking status  . Never Smoker   Smokeless tobacco  . Not on file    History  Alcohol Use     Family History  Problem  Relation Age of Onset  . Heart failure Mother   . Pneumonia Mother   . Heart failure Father     Review of Systems: The patient denies any heat or cold intolerance.  No weight gain or weight loss.  The patient denies headaches or blurry vision.  There is no cough or sputum production.  The patient denies dizziness.  There is no hematuria or hematochezia.  The patient denies any muscle aches or arthritis.  The patient denies any rash.  The patient denies frequent falling or instability.  There is no history of depression or anxiety.  All other systems were reviewed and are negative.   Physical Exam: Filed Vitals:   11/19/10 1539  BP: 166/78  Pulse: 80  The general appearance reveals a well-developed elderly woman in no acute distress.Pupils equal and reactive.   Extraocular Movements are full.  There is no scleral icterus.  The mouth and pharynx are normal.  The neck is supple.  The carotids reveal no bruits.  The jugular venous pressure is normal.  The thyroid is not enlarged.  There is no lymphadenopathy.  The chest is  clear to percussion and auscultation. There are no rales or rhonchi. Expansion of the chest is symmetrical.  The precordium is quiet.  The first heart sound is normal.  The second heart sound is physiologically split.  There is no murmur gallop rub or click.  There is no abnormal lift or heave.  The abdomen is soft and nontender. Bowel sounds are normal. The liver and spleen are not enlarged. There Are no abdominal masses. There are no bruits.  The pedal pulses are good.  There is no phlebitis or edema.  There is no cyanosis or clubbing.  Strength is normal and symmetrical in all extremities.  There is no lateralizing weakness.  There are no sensory deficits.  The skin is warm and dry.  There is no rash.      Assessment / Plan: No change in regimen.  Recheck in 4 months

## 2010-11-19 NOTE — Assessment & Plan Note (Signed)
The patient appears to be doing slightly better in terms of her depression.  She will stay on the present dose of sertraline

## 2010-11-19 NOTE — Assessment & Plan Note (Signed)
The patient has a history of hypercholesterolemia.  She has not done well with statin therapy.  Recently she has not been as careful with her diet and her lipids this time are higher.  She has also been gaining weight and has been drinking a lot of sweet tea.  We talked about this and she will omit the sweet tea and drink water instead and this will help her calories and her lipids and her blood sugar which was also elevated

## 2010-11-26 ENCOUNTER — Telehealth: Payer: Self-pay | Admitting: Cardiology

## 2010-11-26 NOTE — Telephone Encounter (Signed)
Itching all over and in scalp and very restless at bedtime.  Thinks it made her crying worse. Has not had Zoloft since Friday and wants to stop altogether without tapering.  How would you like for her to taper since she has not had any?

## 2010-11-26 NOTE — Telephone Encounter (Signed)
Since she has had none since last Friday.  She can just leave it off with a tapering

## 2010-11-26 NOTE — Telephone Encounter (Signed)
Advised patient

## 2010-11-26 NOTE — Telephone Encounter (Signed)
Pt calling asking that [facility] call pt. Pt would like discuss protocol for getting off medication, sertraline, that pt is now allergic to. Please return pt call to discuss further.

## 2011-03-20 ENCOUNTER — Other Ambulatory Visit: Payer: Medicare Other | Admitting: *Deleted

## 2011-03-20 ENCOUNTER — Ambulatory Visit: Payer: Medicare Other | Admitting: Cardiology

## 2011-03-24 ENCOUNTER — Ambulatory Visit: Payer: Medicare Other | Admitting: Cardiology

## 2011-03-31 ENCOUNTER — Other Ambulatory Visit: Payer: Self-pay | Admitting: *Deleted

## 2011-03-31 MED ORDER — LEVOTHYROXINE SODIUM 100 MCG PO TABS: 100.0000 ug | ORAL_TABLET | Freq: Every day | ORAL | Status: DC

## 2011-03-31 NOTE — Telephone Encounter (Signed)
Refilled levothyroxine.

## 2011-04-08 ENCOUNTER — Other Ambulatory Visit: Payer: Self-pay | Admitting: *Deleted

## 2011-04-22 ENCOUNTER — Ambulatory Visit: Payer: Medicare Other | Admitting: Cardiology

## 2011-04-22 ENCOUNTER — Other Ambulatory Visit: Payer: Medicare Other

## 2011-06-10 ENCOUNTER — Other Ambulatory Visit: Payer: Medicare Other

## 2011-06-13 ENCOUNTER — Telehealth: Payer: Self-pay | Admitting: Cardiology

## 2011-06-16 NOTE — Telephone Encounter (Signed)
Error

## 2011-06-17 ENCOUNTER — Other Ambulatory Visit (INDEPENDENT_AMBULATORY_CARE_PROVIDER_SITE_OTHER): Payer: Medicare Other

## 2011-06-17 DIAGNOSIS — E7800 Pure hypercholesterolemia, unspecified: Secondary | ICD-10-CM

## 2011-06-17 DIAGNOSIS — E039 Hypothyroidism, unspecified: Secondary | ICD-10-CM

## 2011-06-17 DIAGNOSIS — I119 Hypertensive heart disease without heart failure: Secondary | ICD-10-CM

## 2011-06-17 LAB — TSH: TSH: 2.25 u[IU]/mL (ref 0.35–5.50)

## 2011-06-17 LAB — LDL CHOLESTEROL, DIRECT: Direct LDL: 133.5 mg/dL

## 2011-06-17 LAB — HEPATIC FUNCTION PANEL
ALT: 13 U/L (ref 0–35)
AST: 21 U/L (ref 0–37)
Albumin: 4.3 g/dL (ref 3.5–5.2)
Total Bilirubin: 0.4 mg/dL (ref 0.3–1.2)

## 2011-06-17 LAB — LIPID PANEL
Cholesterol: 245 mg/dL — ABNORMAL HIGH (ref 0–200)
HDL: 92.5 mg/dL (ref >39.00)
Triglycerides: 90 mg/dL (ref 0.0–149.0)

## 2011-06-17 LAB — BASIC METABOLIC PANEL
Calcium: 9.7 mg/dL (ref 8.4–10.5)
GFR: 63.06 mL/min (ref >60.00)
Potassium: 4 mEq/L (ref 3.5–5.1)
Sodium: 140 mEq/L (ref 135–145)

## 2011-06-18 NOTE — Progress Notes (Signed)
Quick Note:  Please make copy of labs for patient visit. ______ 

## 2011-06-19 ENCOUNTER — Other Ambulatory Visit: Payer: Medicare Other

## 2011-06-19 ENCOUNTER — Ambulatory Visit (INDEPENDENT_AMBULATORY_CARE_PROVIDER_SITE_OTHER): Payer: Medicare Other | Admitting: Cardiology

## 2011-06-19 ENCOUNTER — Encounter: Payer: Self-pay | Admitting: Cardiology

## 2011-06-19 VITALS — BP 120/70 | HR 80 | Ht 63.0 in | Wt 142.0 lb

## 2011-06-19 DIAGNOSIS — E7800 Pure hypercholesterolemia, unspecified: Secondary | ICD-10-CM

## 2011-06-19 DIAGNOSIS — I119 Hypertensive heart disease without heart failure: Secondary | ICD-10-CM

## 2011-06-19 DIAGNOSIS — E039 Hypothyroidism, unspecified: Secondary | ICD-10-CM

## 2011-06-19 DIAGNOSIS — F341 Dysthymic disorder: Secondary | ICD-10-CM

## 2011-06-19 DIAGNOSIS — F329 Major depressive disorder, single episode, unspecified: Secondary | ICD-10-CM

## 2011-06-19 NOTE — Patient Instructions (Signed)
Your physician wants you to follow-up in: 6 months  You will receive a reminder letter in the mail two months in advance. If you don't receive a letter, please call our office to schedule the follow-up appointment.  Your physician recommends that you continue on your current medications as directed. Please refer to the Current Medication list given to you today.  

## 2011-06-19 NOTE — Progress Notes (Signed)
[name] Date of Birth:  May 30, 1923 [facility] [address] [place], [place]  [address] [phone]  Fax   [phone]  HPI: This pleasant 78 year old woman is seen for a six-month followup office visit.  She has a past history of essential hypertension and history of hypercholesterolemia.  She also has a history of reactive depression.  Her husband died about 2 years ago.  Current Outpatient Prescriptions  Medication Sig Dispense Refill  . amLODipine (NORVASC) 5 MG tablet TAKE 1 TABLET BY MOUTH EVERY DAY  30 tablet  11  . aspirin 81 MG tablet Take 81 mg by mouth daily.        . Cholecalciferol (VITAMIN D PO) Take by mouth. Taking 5000 daily      . Cyanocobalamin (VITAMIN B 12 PO) Take by mouth daily.      Marland Kitchen levothyroxine (SYNTHROID, LEVOTHROID) 100 MCG tablet Take 1 tablet (100 mcg total) by mouth daily. Taking 6 days a week  90 tablet  3  . Naproxen Sodium (ALEVE PO) Take by mouth. As needed       . Omega-3 Fatty Acids (FISH OIL PO) Take by mouth 2 (two) times daily.        . pantoprazole (PROTONIX) 40 MG tablet TAKE 1 TABLET BY MOUTH EVERY DAY  30 tablet  8    Allergies  Allergen Reactions  . Lexapro     Ha,nausea  . Lisinopril   . Statins     myalgias  . Sulfa Antibiotics     hives    Patient Active Problem List  Diagnoses  . Hypothyroidism  . Osteoarthritis  . Hypercholesterolemia  . Reactive depression (situational)  . Benign hypertensive heart disease without heart failure    History  Smoking status  . Never Smoker   Smokeless tobacco  . Not on file    History  Alcohol Use     Family History  Problem Relation Age of Onset  . Heart failure Mother   . Pneumonia Mother   . Heart failure Father     Review of Systems: The patient denies any heat or cold intolerance.  No weight gain or weight loss.  The patient denies headaches or blurry vision.  There is no cough or sputum production.  The patient denies dizziness.  There  is no hematuria or hematochezia.  The patient denies any muscle aches or arthritis.  The patient denies any rash.  The patient denies frequent falling or instability.  There is no history of depression or anxiety.  All other systems were reviewed and are negative.   Physical Exam: Filed Vitals:   06/19/11 1111  BP: 120/70  Pulse: 80   the general appearance reveals a well-developed well-nourished woman in no distress.  She is still teary but less depressed than on her last visit.The head and neck exam reveals pupils equal and reactive.  Extraocular movements are full.  There is no scleral icterus.  The mouth and pharynx are normal.  The neck is supple.  The carotids reveal no bruits.  The jugular venous pressure is normal.  The  thyroid is not enlarged.  There is no lymphadenopathy.  The chest is clear to percussion and auscultation.  There are no rales or rhonchi.  Expansion of the chest is symmetrical.  The precordium is quiet.  The first heart sound is normal.  The second heart sound is physiologically split.  There is no murmur gallop rub or click.  There is no  abnormal lift or heave.  The abdomen is soft and nontender.  The bowel sounds are normal.  The liver and spleen are not enlarged.  There are no abdominal masses.  There are no abdominal bruits.  Extremities reveal good pedal pulses.  There is no phlebitis or edema.  There is no cyanosis or clubbing.  Strength is normal and symmetrical in all extremities.  There is no lateralizing weakness.  There are no sensory deficits.  The skin is warm and dry.  There is no rash.      Assessment / Plan: Continue same medication.  Recheck in 6 months for followup office visit and fasting lab work and TSH

## 2011-06-19 NOTE — Assessment & Plan Note (Signed)
The patient is doing a little better in terms of her depression.  She weaned herself off her antidepressants.

## 2011-06-19 NOTE — Assessment & Plan Note (Signed)
The patient has a past history of hypercholesterolemia.  She is not on any lipid-lowering drugs.  She did not tolerate statins because of myalgia.  She is watching her diet carefully and her lab work has improved

## 2011-06-19 NOTE — Assessment & Plan Note (Signed)
The patient is euthyroid by recent bloodwork on present dose of Synthroid

## 2011-07-01 ENCOUNTER — Other Ambulatory Visit: Payer: Self-pay | Admitting: *Deleted

## 2011-07-01 DIAGNOSIS — K219 Gastro-esophageal reflux disease without esophagitis: Secondary | ICD-10-CM

## 2011-07-01 MED ORDER — PANTOPRAZOLE SODIUM 40 MG PO TBEC: 40.0000 mg | DELAYED_RELEASE_TABLET | Freq: Every day | ORAL | Status: DC

## 2011-08-15 ENCOUNTER — Other Ambulatory Visit: Payer: Self-pay | Admitting: Cardiology

## 2011-08-15 DIAGNOSIS — I119 Hypertensive heart disease without heart failure: Secondary | ICD-10-CM

## 2011-08-15 MED ORDER — AMLODIPINE BESYLATE 5 MG PO TABS: 5.0000 mg | ORAL_TABLET | Freq: Every day | ORAL | Status: DC

## 2011-10-09 ENCOUNTER — Other Ambulatory Visit: Payer: Self-pay | Admitting: *Deleted

## 2011-10-09 DIAGNOSIS — K219 Gastro-esophageal reflux disease without esophagitis: Secondary | ICD-10-CM

## 2011-10-09 MED ORDER — PANTOPRAZOLE SODIUM 40 MG PO TBEC: 40.0000 mg | DELAYED_RELEASE_TABLET | Freq: Every day | ORAL | Status: DC

## 2011-10-09 NOTE — Telephone Encounter (Signed)
Refilled pantoprazole.

## 2011-10-16 ENCOUNTER — Ambulatory Visit (INDEPENDENT_AMBULATORY_CARE_PROVIDER_SITE_OTHER): Payer: Medicare Other | Admitting: Cardiology

## 2011-10-16 ENCOUNTER — Encounter: Payer: Self-pay | Admitting: Cardiology

## 2011-10-16 VITALS — BP 134/70 | HR 84 | Ht 63.0 in | Wt 142.0 lb

## 2011-10-16 DIAGNOSIS — Z79899 Other long term (current) drug therapy: Secondary | ICD-10-CM

## 2011-10-16 DIAGNOSIS — F329 Major depressive disorder, single episode, unspecified: Secondary | ICD-10-CM

## 2011-10-16 DIAGNOSIS — E7800 Pure hypercholesterolemia, unspecified: Secondary | ICD-10-CM

## 2011-10-16 DIAGNOSIS — K529 Noninfective gastroenteritis and colitis, unspecified: Secondary | ICD-10-CM

## 2011-10-16 DIAGNOSIS — I119 Hypertensive heart disease without heart failure: Secondary | ICD-10-CM

## 2011-10-16 DIAGNOSIS — F341 Dysthymic disorder: Secondary | ICD-10-CM

## 2011-10-16 DIAGNOSIS — K5289 Other specified noninfective gastroenteritis and colitis: Secondary | ICD-10-CM

## 2011-10-16 DIAGNOSIS — E039 Hypothyroidism, unspecified: Secondary | ICD-10-CM

## 2011-10-16 NOTE — Assessment & Plan Note (Signed)
The patient is clinically euthyroid. 

## 2011-10-16 NOTE — Patient Instructions (Addendum)
Will obtain labs today and call you with the results  Your physician recommends that you continue on your current medications as directed. Please refer to the Current Medication list given to you today.   Your physician recommends that you schedule a follow-up appointment in: 4 months with fasting labs (lp/bmet/hfp/tsh)

## 2011-10-16 NOTE — Progress Notes (Signed)
Danielle Bryan Date of Birth:  10-15-1923 [facility] [address] [place], [place]  [address] [phone]  Fax   [phone]  HPI: This pleasant 78 year old woman is seen for a scheduled followup office visit.  She has a past history of hypothyroidism hypercholesterolemia and essential hypertension.  She also has a history of reactive depression.  She has been depressed since her husband died several years ago. She comes in early this time because she had a bad spell of diarrhea last week.  She lost about 7 pounds in 2 days.  She became sick after eating at [employer] earlier that day.  Current Outpatient Prescriptions  Medication Sig Dispense Refill  . amLODipine (NORVASC) 5 MG tablet Take 1 tablet (5 mg total) by mouth daily.  90 tablet  3  . aspirin 81 MG tablet Take 81 mg by mouth daily.        . Cyanocobalamin (VITAMIN B 12 PO) Take by mouth daily.      Marland Kitchen levothyroxine (SYNTHROID, LEVOTHROID) 100 MCG tablet Take 1 tablet (100 mcg total) by mouth daily. Taking 6 days a week  90 tablet  3  . Naproxen Sodium (ALEVE PO) Take by mouth. As needed       . Omega-3 Fatty Acids (FISH OIL PO) Take by mouth 2 (two) times daily.        . pantoprazole (PROTONIX) 40 MG tablet Take 40 mg by mouth daily. As needed      . DISCONTD: pantoprazole (PROTONIX) 40 MG tablet Take 1 tablet (40 mg total) by mouth daily.  90 tablet  3    Allergies  Allergen Reactions  . Escitalopram Oxalate     Ha,nausea  . Lisinopril   . Statins     myalgias  . Sulfa Antibiotics     hives    Patient Active Problem List  Diagnosis  . Hypothyroidism  . Osteoarthritis  . Hypercholesterolemia  . Reactive depression (situational)  . Benign hypertensive heart disease without heart failure    History  Smoking status  . Never Smoker   Smokeless tobacco  . Not on file    History  Alcohol Use     Family History  Problem Relation Age of Onset  . Heart failure Mother   . Pneumonia  Mother   . Heart failure Father     Review of Systems: The patient denies any heat or cold intolerance.  No weight gain or weight loss.  The patient denies headaches or blurry vision.  There is no cough or sputum production.  The patient denies dizziness.  There is no hematuria or hematochezia.  The patient denies any muscle aches or arthritis.  The patient denies any rash.  The patient denies frequent falling or instability.  There is no history of depression or anxiety.  All other systems were reviewed and are negative.   Physical Exam: Filed Vitals:   10/16/11 1541  BP: 134/70  Pulse: 84   the general appearance reveals an elderly woman who appears to be depressed.The head and neck exam reveals pupils equal and reactive.  Extraocular movements are full.  There is no scleral icterus.  The mouth and pharynx are normal.  The neck is supple.  The carotids reveal no bruits.  The jugular venous pressure is normal.  The  thyroid is not enlarged.  There is no lymphadenopathy.  The chest is clear to percussion and auscultation.  There are no rales or rhonchi.  Expansion of  the chest is symmetrical.  The precordium is quiet.  The first heart sound is normal.  The second heart sound is physiologically split.  There is no murmur gallop rub or click.  There is no abnormal lift or heave.  The abdomen is soft and nontender.  The bowel sounds are normal.  The liver and spleen are not enlarged.  There are no abdominal masses.  There are no abdominal bruits.  Extremities reveal good pedal pulses.  There is no phlebitis or edema.  There is no cyanosis or clubbing.  Strength is normal and symmetrical in all extremities.  There is no lateralizing weakness.  There are no sensory deficits.  The skin is warm and dry.  There is no rash.      Assessment / Plan: Continue same medication.  Blood work today is pending.  Recheck in 4 months for office visit and lab work

## 2011-10-16 NOTE — Assessment & Plan Note (Signed)
The patient is still very emotionally labile and tends to cry very easily.  Her son Onalee Hua has been very good in helping to care for her

## 2011-10-16 NOTE — Assessment & Plan Note (Signed)
We are checking her lipids today.  After her recent gastroenteritis her lipids may be unusually good this time

## 2011-10-17 LAB — LIPID PANEL
HDL: 66.9 mg/dL (ref >39.00)
Total CHOL/HDL Ratio: 3
VLDL: 56.4 mg/dL — ABNORMAL HIGH (ref 0.0–40.0)

## 2011-10-17 LAB — TSH: TSH: 1.94 u[IU]/mL (ref 0.35–5.50)

## 2011-10-17 LAB — HEPATIC FUNCTION PANEL
Alkaline Phosphatase: 72 U/L (ref 39–117)
Bilirubin, Direct: 0 mg/dL (ref 0.0–0.3)
Total Bilirubin: 0.4 mg/dL (ref 0.3–1.2)

## 2011-10-17 LAB — BASIC METABOLIC PANEL
BUN: 21 mg/dL (ref 6–23)
CO2: 28 mEq/L (ref 19–32)
Chloride: 101 mEq/L (ref 96–112)
Glucose, Bld: 95 mg/dL (ref 70–99)
Potassium: 4.4 mEq/L (ref 3.5–5.1)

## 2011-10-18 NOTE — Progress Notes (Signed)
Quick Note:  Please report to patient. The recent labs are stable. Continue same medication and careful diet. ______ 

## 2011-10-21 ENCOUNTER — Telehealth: Payer: Self-pay | Admitting: Cardiology

## 2011-10-21 NOTE — Telephone Encounter (Signed)
Message copied by Burnell Blanks on Tue Oct 21, 2011  2:19 PM ------      Message from: Cassell Clement      Created: Sat Oct 18, 2011  2:03 PM       Please report to patient.  The recent labs are stable. Continue same medication and careful diet.

## 2011-10-21 NOTE — Telephone Encounter (Signed)
New Problem:    Patient called in wanting to know the results of her latest lab work and a copy mailed to her.  Please call back.

## 2011-10-21 NOTE — Telephone Encounter (Signed)
Advised patient and mailed

## 2011-10-24 ENCOUNTER — Telehealth: Payer: Self-pay | Admitting: Cardiology

## 2011-10-24 NOTE — Telephone Encounter (Signed)
New problem:  Patient receive a copy of her lab work. Would like additional information on how to lower her Trig, LDL.

## 2011-10-24 NOTE — Telephone Encounter (Signed)
Discussed ways to lower triglycerides, told pt to reduce whites/ wheat's and sweets. Pt nervous about her triglyceride levels, pt reassured and questions answered, pt agreed to plan.

## 2012-02-10 ENCOUNTER — Other Ambulatory Visit: Payer: Self-pay | Admitting: Cardiology

## 2012-02-10 MED ORDER — LEVOTHYROXINE SODIUM 100 MCG PO TABS: 100.0000 ug | ORAL_TABLET | Freq: Every day | ORAL | Status: DC

## 2012-02-12 ENCOUNTER — Other Ambulatory Visit (INDEPENDENT_AMBULATORY_CARE_PROVIDER_SITE_OTHER): Payer: Medicare Other

## 2012-02-12 ENCOUNTER — Ambulatory Visit (INDEPENDENT_AMBULATORY_CARE_PROVIDER_SITE_OTHER): Payer: Medicare Other | Admitting: Cardiology

## 2012-02-12 ENCOUNTER — Encounter: Payer: Self-pay | Admitting: Cardiology

## 2012-02-12 VITALS — BP 162/66 | HR 88 | Ht 63.0 in | Wt 143.0 lb

## 2012-02-12 DIAGNOSIS — E039 Hypothyroidism, unspecified: Secondary | ICD-10-CM

## 2012-02-12 DIAGNOSIS — M1990 Unspecified osteoarthritis, unspecified site: Secondary | ICD-10-CM

## 2012-02-12 DIAGNOSIS — F329 Major depressive disorder, single episode, unspecified: Secondary | ICD-10-CM

## 2012-02-12 DIAGNOSIS — R079 Chest pain, unspecified: Secondary | ICD-10-CM

## 2012-02-12 DIAGNOSIS — F341 Dysthymic disorder: Secondary | ICD-10-CM

## 2012-02-12 DIAGNOSIS — I119 Hypertensive heart disease without heart failure: Secondary | ICD-10-CM

## 2012-02-12 DIAGNOSIS — E7800 Pure hypercholesterolemia, unspecified: Secondary | ICD-10-CM

## 2012-02-12 LAB — TSH: TSH: 0.86 u[IU]/mL (ref 0.35–5.50)

## 2012-02-12 LAB — BASIC METABOLIC PANEL
BUN: 23 mg/dL (ref 6–23)
Chloride: 102 mEq/L (ref 96–112)
GFR: 59.15 mL/min — ABNORMAL LOW (ref >60.00)
Potassium: 3.8 mEq/L (ref 3.5–5.1)
Sodium: 140 mEq/L (ref 135–145)

## 2012-02-12 LAB — HEPATIC FUNCTION PANEL
ALT: 12 U/L (ref 0–35)
Albumin: 4.3 g/dL (ref 3.5–5.2)
Total Protein: 7.9 g/dL (ref 6.0–8.3)

## 2012-02-12 LAB — LIPID PANEL
Cholesterol: 243 mg/dL — ABNORMAL HIGH (ref 0–200)
HDL: 79.1 mg/dL (ref >39.00)
Triglycerides: 99 mg/dL (ref 0.0–149.0)
VLDL: 19.8 mg/dL (ref 0.0–40.0)

## 2012-02-12 LAB — LDL CHOLESTEROL, DIRECT: Direct LDL: 147.7 mg/dL

## 2012-02-12 MED ORDER — SERTRALINE HCL 25 MG PO TABS: 25.0000 mg | ORAL_TABLET | Freq: Every day | ORAL | Status: DC

## 2012-02-12 NOTE — Assessment & Plan Note (Signed)
The patient has significant osteoarthritis of the shoulders continues to give her a lot of trouble.

## 2012-02-12 NOTE — Assessment & Plan Note (Signed)
The patient complains of staying cold all the time.  She does not get much regular aerobic walking exercise.  He is on Synthroid replacement and we are rechecking a TSH today to be sure that she is euthyroid.

## 2012-02-12 NOTE — Assessment & Plan Note (Signed)
The patient continues to have significant depression and cries on a daily basis.  She would like to try an antidepressant again.  We will give her generic Zoloft 25 mg daily and if it seems to help we will consider increasing the dose subsequently.

## 2012-02-12 NOTE — Assessment & Plan Note (Signed)
Her blood pressure today shows significant elevation of the systolic pressure.  I rechecked it myself and got 170/76.  I want her to start checking her blood pressures on a home blood pressure cuff and we gave her a prescription for that today.  I suspect that she has a significant element of white coat syndrome.

## 2012-02-12 NOTE — Patient Instructions (Addendum)
WILL GO AFTER CHRISTMAS FOR CHEST XRAY  Increase your aerobic exercise  START ZOLOFT 25 MG DAILY  GET HOME BLOOD PRESSURE MONITOR    Your physician wants you to follow-up in: 4 months with fasting labs (lp/bmet/hfp/tsh/ekg) You will receive a reminder letter in the mail two months in advance. If you don't receive a letter, please call our office to schedule the follow-up appointment.

## 2012-02-12 NOTE — Progress Notes (Signed)
[name] Date of Birth:  Aug 26, 1923 [facility] [address] [place], [place]  [address] [phone]         Fax   [phone]  History of Present Illness: This pleasant 78 year old woman is seen for a scheduled followup office visit. She has a past history of hypothyroidism, hypercholesterolemia, and essential hypertension. She also has a history of reactive depression. She has been depressed since her husband died several years ago.  Since last visit she has been experiencing some intermittent lower left anterior chest discomfort.  She has tenderness along the costochondral margin on the left.  Her last chest x-ray was 05/22/10 and we will update her chest x-ray.  She continues to have problems with depression and also with complaints of staying cold all the time.   Current Outpatient Prescriptions  Medication Sig Dispense Refill  . amLODipine (NORVASC) 5 MG tablet Take 1 tablet (5 mg total) by mouth daily.  90 tablet  3  . aspirin 81 MG tablet Take 81 mg by mouth daily.        . Cyanocobalamin (VITAMIN B 12 PO) Take by mouth daily.      Marland Kitchen levothyroxine (SYNTHROID, LEVOTHROID) 100 MCG tablet Take 1 tablet (100 mcg total) by mouth daily. Taking 6 days a week  90 tablet  3  . Naproxen Sodium (ALEVE PO) Take by mouth. As needed       . Omega-3 Fatty Acids (FISH OIL PO) Take by mouth 2 (two) times daily.        . pantoprazole (PROTONIX) 40 MG tablet Take 40 mg by mouth daily. As needed      . sertraline (ZOLOFT) 25 MG tablet Take 1 tablet (25 mg total) by mouth daily.  30 tablet  5    Allergies  Allergen Reactions  . Escitalopram Oxalate     Ha,nausea  . Lisinopril   . Statins     myalgias  . Sulfa Antibiotics     hives    Patient Active Problem List  Diagnosis  . Hypothyroidism  . Osteoarthritis  . Hypercholesterolemia  . Reactive depression (situational)  . Benign hypertensive heart disease without heart failure    History  Smoking status  .  Never Smoker   Smokeless tobacco  . Not on file    History  Alcohol Use     Family History  Problem Relation Age of Onset  . Heart failure Mother   . Pneumonia Mother   . Heart failure Father     Review of Systems: Constitutional: no fever chills diaphoresis or fatigue or change in weight.  Head and neck: no hearing loss, no epistaxis, no photophobia or visual disturbance. Respiratory: No cough, shortness of breath or wheezing. Cardiovascular: No chest pain peripheral edema, palpitations. Gastrointestinal: No abdominal distention, no abdominal pain, no change in bowel habits hematochezia or melena. Genitourinary: No dysuria, no frequency, no urgency, no nocturia. Musculoskeletal:No arthralgias, no back pain, no gait disturbance or myalgias. Neurological: No dizziness, no headaches, no numbness, no seizures, no syncope, no weakness, no tremors. Hematologic: No lymphadenopathy, no easy bruising. Psychiatric: No confusion, no hallucinations, no sleep disturbance.    Physical Exam: Filed Vitals:   02/12/12 1038  BP: 162/66  Pulse: 88   the general appearance reveals a well-developed well-nourished woman in no distress.The head and neck exam reveals pupils equal and reactive.  Extraocular movements are full.  There is no scleral icterus.  The mouth and pharynx are normal.  The  neck is supple.  The carotids reveal no bruits.  The jugular venous pressure is normal.  The  thyroid is not enlarged.  There is no lymphadenopathy.  The chest is clear to percussion and auscultation.  There are no rales or rhonchi.  Expansion of the chest is symmetrical.  The precordium is quiet.  The first heart sound is normal.  The second heart sound is physiologically split.  There is no murmur gallop rub or click.  There is no abnormal lift or heave.  The abdomen is soft and nontender.  The bowel sounds are normal.  The liver and spleen are not enlarged.  She is tender along the left anterior costochondral  junction inferiorly. There are no abdominal masses.  There are no abdominal bruits.  Extremities reveal good pedal pulses.  There is no phlebitis or edema.  There is no cyanosis or clubbing.  Strength is normal and symmetrical in all extremities.  There is no lateralizing weakness.  There are no sensory deficits.  The skin is warm and dry.  There is no rash.     Assessment / Plan: Chest pain probably secondary to costochondritis. Persistent reactive depression  Plan: Get chest x-ray update Trial of Zoloft 25 mg daily for depression Start checking blood pressures at home and let us know if blood pressure remains elevated.  She does have element of white coat syndrome here in the doctor's office. Recheck in 4 months for office visit EKG lipid panel basal metabolic panel hepatic function panel TSH

## 2012-02-12 NOTE — Progress Notes (Signed)
Quick Note:  Please report to patient. The recent labs are stable. Continue same medication and careful diet. The cholesterol is higher but the triglycerides are much better. The thyroid function is normal. The blood sugar is slightly elevated. Continue same medication and work harder on careful diet and try to increase aerobic walking exercise ______

## 2012-02-17 ENCOUNTER — Telehealth: Payer: Self-pay | Admitting: *Deleted

## 2012-02-17 NOTE — Telephone Encounter (Signed)
Message copied by Burnell Blanks on Tue Feb 17, 2012  9:02 AM ------      Message from: Cassell Clement      Created: Thu Feb 12, 2012  4:37 PM       Please report to patient.  The recent labs are stable. Continue same medication and careful diet.  The cholesterol is higher but the triglycerides are much better.  The thyroid function is normal.  The blood sugar is slightly elevated.  Continue same medication and work harder on careful diet and try to increase aerobic walking exercise

## 2012-02-17 NOTE — Telephone Encounter (Signed)
Mailed copy of labs, unable to leave message. Did write a note to call if any questions 

## 2012-02-23 ENCOUNTER — Ambulatory Visit
Admission: RE | Admit: 2012-02-23 | Discharge: 2012-02-23 | Disposition: A | Discharge status: Home / Self Care | Payer: Medicare Other | Source: Ambulatory Visit | Attending: Cardiology | Admitting: Cardiology

## 2012-02-23 DIAGNOSIS — I119 Hypertensive heart disease without heart failure: Secondary | ICD-10-CM

## 2012-03-10 ENCOUNTER — Telehealth: Payer: Self-pay | Admitting: Cardiology

## 2012-03-10 NOTE — Telephone Encounter (Signed)
Pt would like to know what GFR is in her labs and she would like a copy of her chest x-ray sent to her

## 2012-03-10 NOTE — Telephone Encounter (Signed)
Discussed labs with patient and mailed copy chest xray

## 2012-03-19 ENCOUNTER — Other Ambulatory Visit: Payer: Self-pay

## 2012-03-19 ENCOUNTER — Telehealth: Payer: Self-pay | Admitting: Cardiology

## 2012-03-19 MED ORDER — SERTRALINE HCL 25 MG PO TABS: 25.0000 mg | ORAL_TABLET | Freq: Every day | ORAL | Status: DC

## 2012-03-19 NOTE — Telephone Encounter (Signed)
Pt wants to ask you about something that happen to her Tuesday

## 2012-03-19 NOTE — Telephone Encounter (Signed)
Advised patient

## 2012-03-19 NOTE — Telephone Encounter (Signed)
On Tuesday she had an episode where she was talking to her sister-in-law on where she could not speak clearly and could not get her words out.  She then sat down and saw an arch of light. After that she started to read a book out loud to see if he speech was different.  Patient states she is fine now and has been since then. Denies any weakness on one side or any other symptoms. Episode lasted less than a minute. Will forward to  Dr. Patty Sermons for review

## 2012-03-19 NOTE — Telephone Encounter (Signed)
She should be sure to take her ASA faithfully every day.  She should let us know if another episode occurs. May need doppler etc.  She should not wait 3 days before reporting symptoms.

## 2012-05-10 ENCOUNTER — Other Ambulatory Visit: Payer: Self-pay | Admitting: *Deleted

## 2012-05-10 MED ORDER — AMLODIPINE BESYLATE 5 MG PO TABS: 5.0000 mg | ORAL_TABLET | Freq: Every day | ORAL | Status: DC

## 2012-08-17 ENCOUNTER — Other Ambulatory Visit: Payer: Self-pay | Admitting: *Deleted

## 2012-08-17 DIAGNOSIS — F329 Major depressive disorder, single episode, unspecified: Secondary | ICD-10-CM

## 2012-08-17 MED ORDER — SERTRALINE HCL 25 MG PO TABS: 25.0000 mg | ORAL_TABLET | Freq: Every day | ORAL | Status: DC

## 2012-09-20 ENCOUNTER — Other Ambulatory Visit: Payer: Self-pay | Admitting: *Deleted

## 2012-09-20 DIAGNOSIS — F329 Major depressive disorder, single episode, unspecified: Secondary | ICD-10-CM

## 2012-09-20 MED ORDER — SERTRALINE HCL 25 MG PO TABS: 25.0000 mg | ORAL_TABLET | Freq: Every day | ORAL | Status: DC

## 2012-09-26 ENCOUNTER — Emergency Department (HOSPITAL_COMMUNITY): Payer: Medicare Other

## 2012-09-26 ENCOUNTER — Encounter (HOSPITAL_COMMUNITY): Payer: Self-pay | Admitting: Emergency Medicine

## 2012-09-26 ENCOUNTER — Emergency Department (HOSPITAL_COMMUNITY)
Admission: EM | Admit: 2012-09-26 | Discharge: 2012-09-26 | Disposition: A | Discharge status: Home / Self Care | Payer: Medicare Other | Attending: Emergency Medicine | Admitting: Emergency Medicine

## 2012-09-26 DIAGNOSIS — E039 Hypothyroidism, unspecified: Secondary | ICD-10-CM | POA: Insufficient documentation

## 2012-09-26 DIAGNOSIS — M129 Arthropathy, unspecified: Secondary | ICD-10-CM | POA: Insufficient documentation

## 2012-09-26 DIAGNOSIS — F4321 Adjustment disorder with depressed mood: Secondary | ICD-10-CM | POA: Insufficient documentation

## 2012-09-26 DIAGNOSIS — Z862 Personal history of diseases of the blood and blood-forming organs and certain disorders involving the immune mechanism: Secondary | ICD-10-CM | POA: Insufficient documentation

## 2012-09-26 DIAGNOSIS — I10 Essential (primary) hypertension: Secondary | ICD-10-CM | POA: Insufficient documentation

## 2012-09-26 DIAGNOSIS — Z7982 Long term (current) use of aspirin: Secondary | ICD-10-CM | POA: Insufficient documentation

## 2012-09-26 DIAGNOSIS — R55 Syncope and collapse: Secondary | ICD-10-CM

## 2012-09-26 DIAGNOSIS — Z79899 Other long term (current) drug therapy: Secondary | ICD-10-CM | POA: Insufficient documentation

## 2012-09-26 DIAGNOSIS — Z8639 Personal history of other endocrine, nutritional and metabolic disease: Secondary | ICD-10-CM | POA: Insufficient documentation

## 2012-09-26 LAB — CBC WITH DIFFERENTIAL/PLATELET
Basophils Absolute: 0 10*3/uL (ref 0.0–0.1)
Basophils Relative: 0 % (ref 0–1)
HCT: 38.6 % (ref 36.0–46.0)
Hemoglobin: 13.2 g/dL (ref 12.0–15.0)
Lymphocytes Relative: 24 % (ref 12–46)
MCHC: 34.2 g/dL (ref 30.0–36.0)
Monocytes Absolute: 0.8 10*3/uL (ref 0.1–1.0)
Monocytes Relative: 12 % (ref 3–12)
Neutro Abs: 4.6 10*3/uL (ref 1.7–7.7)
Neutrophils Relative %: 63 % (ref 43–77)
RBC: 4.37 MIL/uL (ref 3.87–5.11)
WBC: 7.2 10*3/uL (ref 4.0–10.5)

## 2012-09-26 LAB — URINALYSIS, ROUTINE W REFLEX MICROSCOPIC
Glucose, UA: NEGATIVE mg/dL
Protein, ur: NEGATIVE mg/dL
Urobilinogen, UA: 0.2 mg/dL (ref 0.0–1.0)

## 2012-09-26 LAB — BASIC METABOLIC PANEL
CO2: 23 mEq/L (ref 19–32)
Calcium: 9.7 mg/dL (ref 8.4–10.5)
Sodium: 136 mEq/L (ref 135–145)

## 2012-09-26 LAB — URINE MICROSCOPIC-ADD ON

## 2012-09-26 MED ORDER — SODIUM CHLORIDE 0.9 % IV BOLUS (SEPSIS)
1000.0000 mL | Freq: Once | INTRAVENOUS | Status: AC
  Administered 2012-09-26: 1000 mL via INTRAVENOUS

## 2012-09-26 NOTE — ED Notes (Signed)
Information reported to EMS was . Pt walked down stairs and became hot and flushed . Pt sat down at bottom of stairs. Pt denies fall and denies LOC.

## 2012-09-26 NOTE — ED Provider Notes (Signed)
I saw and evaluated the patient, reviewed the resident's note and I agree with the findings and plan.  Date: 09/26/2012  Rate: 80  Rhythm: normal sinus rhythm  QRS Axis: normal  Intervals: normal  ST/T Wave abnormalities: normal  Conduction Disutrbances: none  Narrative Interpretation: unremarkable  Patient here with episode of syncope today that sounds vasovagal. She is asymptomatic now and states that just prior to the episode she felt lightheaded but denied any chest pain or shortness of breath. Patient had a minimum today approximately one hour prior to this event and states she never drinks and also had a cup of coffee but is drinking fluid today. Labs are within normal limits and feel most likely recent alcohol use and no new about her as the cause of her symptoms. Given strict return precautions for her PCP     Gwyneth Sprout, MD 09/26/12 2328

## 2012-09-26 NOTE — ED Notes (Signed)
Pt and family comfortable with d/c and f/u instructions. 

## 2012-09-26 NOTE — ED Provider Notes (Signed)
CSN: [id number]     Arrival date & time 09/26/12  1517 History     First MD Initiated Contact with Patient 09/26/12 1517     Chief Complaint  Patient presents with  . Near Syncope   (Consider location/radiation/quality/duration/timing/severity/associated sxs/prior Treatment) Patient is a 79 y.o. female presenting with syncope. The history is provided by the patient.  Loss of Consciousness Episode history:  Single Most recent episode:  Today Timing:  Rare Progression:  Resolved Chronicity:  New Witnessed: yes   Relieved by:  Lying down Worsened by:  Nothing tried Ineffective treatments:  None tried Associated symptoms: no chest pain, no diaphoresis, no dizziness, no fever, no headaches, no nausea, no palpitations, no seizures, no shortness of breath and no vomiting     Past Medical History  Diagnosis Date  . Hypertension   . Hypothyroidism   . Osteoarthritis   . Hypercholesterolemia   . Reactive depression (situational)    History reviewed. No pertinent past surgical history. Family History  Problem Relation Age of Onset  . Heart failure Mother   . Pneumonia Mother   . Heart failure Father    History  Substance Use Topics  . Smoking status: Never Smoker   . Smokeless tobacco: Not on file  . Alcohol Use:    OB History   Grav Para Term Preterm Abortions TAB SAB Ect Mult Living                 Review of Systems  Constitutional: Negative for fever, chills, diaphoresis and fatigue.  HENT: Negative for ear pain, congestion, sore throat, facial swelling, mouth sores, trouble swallowing, neck pain and neck stiffness.   Eyes: Negative.   Respiratory: Negative for apnea, cough, chest tightness, shortness of breath and wheezing.   Cardiovascular: Positive for syncope. Negative for chest pain, palpitations and leg swelling.  Gastrointestinal: Negative for nausea, vomiting, abdominal pain, diarrhea and abdominal distention.  Genitourinary: Negative for hematuria, flank  pain, vaginal discharge, difficulty urinating and menstrual problem.  Musculoskeletal: Negative for back pain and gait problem.  Skin: Negative for rash and wound.  Neurological: Positive for syncope. Negative for dizziness, tremors, seizures, facial asymmetry, numbness and headaches.  Psychiatric/Behavioral: Negative.   All other systems reviewed and are negative.    Allergies  Escitalopram oxalate; Lisinopril; Statins; and Sulfa antibiotics  Home Medications   Current Outpatient Rx  Name  Route  Sig  Dispense  Refill  . amLODipine (NORVASC) 5 MG tablet   Oral   Take 1 tablet (5 mg total) by mouth daily.   90 tablet   3   . aspirin 81 MG tablet   Oral   Take 81 mg by mouth daily.           . Cyanocobalamin (VITAMIN B 12 PO)   Oral   Take 1 tablet by mouth daily.          Marland Kitchen levothyroxine (SYNTHROID, LEVOTHROID) 100 MCG tablet   Oral   Take 100 mcg by mouth daily. Taking 6 days a week. Don't take on sundays         . Naproxen Sodium (ALEVE PO)   Oral   Take 1 tablet by mouth daily as needed. As needed for pain         . Omega-3 Fatty Acids (FISH OIL PO)   Oral   Take 1 tablet by mouth 2 (two) times daily.          . pantoprazole (PROTONIX) 40 MG tablet  Oral   Take 40 mg by mouth daily as needed. As needed         . sertraline (ZOLOFT) 25 MG tablet   Oral   Take 1 tablet (25 mg total) by mouth daily.   90 tablet   0     .Patient needs to contact office to schedule  Appo ...    BP 150/70  Pulse 98  Temp(Src) 98.3 F (36.8 C)  Resp 18  Ht 5\' 3"  (1.6 m)  Wt 134 lb (60.782 kg)  BMI 23.74 kg/m2  SpO2 99% Physical Exam  Nursing note and vitals reviewed. Constitutional: She is oriented to person, place, and time. She appears well-developed and well-nourished. No distress.  HENT:  Head: Normocephalic and atraumatic.  Right Ear: External ear normal.  Left Ear: External ear normal.  Nose: Nose normal.  Mouth/Throat: Oropharynx is clear and  moist. No oropharyngeal exudate.  Eyes: Conjunctivae and EOM are normal. Pupils are equal, round, and reactive to light. Right eye exhibits no discharge. Left eye exhibits no discharge.  Neck: Normal range of motion. Neck supple. No JVD present. No spinous process tenderness present. No tracheal deviation present. No thyromegaly present.  Cardiovascular: Normal rate, regular rhythm, normal heart sounds and intact distal pulses.  Exam reveals no gallop and no friction rub.   No murmur heard. Pulmonary/Chest: Effort normal and breath sounds normal. No respiratory distress. She has no wheezes. She has no rales. She exhibits no tenderness.  Abdominal: Soft. Bowel sounds are normal. She exhibits no distension. There is no tenderness. There is no rebound and no guarding.  Musculoskeletal: Normal range of motion.  Lymphadenopathy:    She has no cervical adenopathy.  Neurological: She is alert and oriented to person, place, and time. No cranial nerve deficit or sensory deficit. Coordination normal.  Normal finger to nose and heel to shin. No truncal ataxia  Skin: Skin is warm. No rash noted. She is not diaphoretic.  Psychiatric: She has a normal mood and affect. Her behavior is normal. Judgment and thought content normal.    ED Course   Procedures (including critical care time)  Labs Reviewed  BASIC METABOLIC PANEL - Abnormal; Notable for the following:    Glucose, Bld 114 (*)    BUN 25 (*)    Creatinine, Ser 1.26 (*)    GFR calc non Af Amer 37 (*)    GFR calc Af Amer 43 (*)    All other components within normal limits  URINALYSIS, ROUTINE W REFLEX MICROSCOPIC - Abnormal; Notable for the following:    APPearance CLOUDY (*)    Leukocytes, UA MODERATE (*)    All other components within normal limits  URINE MICROSCOPIC-ADD ON - Abnormal; Notable for the following:    Bacteria, UA FEW (*)    All other components within normal limits  URINE CULTURE  CBC WITH DIFFERENTIAL   Dg Chest 2  View  09/26/2012   *RADIOLOGY REPORT*  Clinical Data: Near-syncope  CHEST - 2 VIEW  Comparison: 02/23/2012  Findings: Chronic interstitial markings/emphysematous changes.  No focal consolidation.  Biapical pleural parenchymal scarring. No pleural effusion or pneumothorax.  The heart is normal in size.  Exaggerated thoracic kyphosis with mild degenerative changes.  IMPRESSION: No evidence of acute cardiopulmonary disease.   Original Report Authenticated By: Charline Bills, M.D.   1. Syncope     MDM  78 yr old F patient with PMH of HTN, thyroid disease presents here after syncopal episode. According to the  patient she was visiting a friend's new house when she was walking down the stairs and starting feeling warm and sat on the bottom stair and the next thing she remembers is waking up to her family calling the ambulance. No reported seizure activity and no post-ictal state. Patient now without symptoms and does not complain of any antecedent CP, HA, abdominal pain, N/V/D. No recent illnesses or fevers. This has never happened before. There was no fall and patient was slumped against the wall. No signs of injury to the head and no neck pain, so I do not think imaging for those would be helpful. Will screen for serious causes of syncope with EKG, and blood work along with CXR and UA for underlying possible infectious process.  EKG: normal EKG, normal sinus rhythm, unchanged from previous tracings. No signs of arrythmia.  No signs of infection or anemia on lab work. Still asymptomatic. Possible dehydration based on orthostatics but gave 1L of fluid. Ambulatory. Will follow up with PCP  Case discussed with Dr. Laurena Bering, MD 09/26/12 [phone]

## 2012-09-27 ENCOUNTER — Telehealth: Payer: Self-pay | Admitting: Cardiology

## 2012-09-27 NOTE — Telephone Encounter (Signed)
Returned call to patient phone rings busy. 

## 2012-09-27 NOTE — Telephone Encounter (Signed)
New prob  Pt would like to speak with a nurse regarding an ER visit she had.

## 2012-09-28 LAB — URINE CULTURE

## 2012-09-28 NOTE — Telephone Encounter (Signed)
Pt calls today for Dr. Patty Sermons to know she had been in the ED on 09/26/12. She had a syncopal episode after walking up/down "steep steps at a home with no airconditioning".  States she felt a little "strange" when she sat down & that was all she remembered until ambulance arrived  States she was checked out & they did not think she needed follow-up   Pt would like to know if Dr. Patty Sermons would like for her to make an appointment for follow-up.  Offered pt appointment but she want to hear from him.  Will forward this to Dr. Corliss Blacker RN

## 2012-09-28 NOTE — Telephone Encounter (Signed)
Dr. Patty Sermons aware.  appt made for 10/29/12 at 4:15pm  Mylo Red RN

## 2012-09-28 NOTE — Telephone Encounter (Signed)
Follow Up     Pt wanted to notify Dr. Patty Sermons that she was in the ER for syncope over the weekend.

## 2012-10-29 ENCOUNTER — Encounter: Payer: Self-pay | Admitting: Cardiology

## 2012-10-29 ENCOUNTER — Ambulatory Visit (INDEPENDENT_AMBULATORY_CARE_PROVIDER_SITE_OTHER): Payer: Medicare Other | Admitting: Cardiology

## 2012-10-29 VITALS — BP 126/56 | HR 96 | Ht 63.0 in | Wt 139.0 lb

## 2012-10-29 DIAGNOSIS — E7800 Pure hypercholesterolemia, unspecified: Secondary | ICD-10-CM

## 2012-10-29 DIAGNOSIS — I119 Hypertensive heart disease without heart failure: Secondary | ICD-10-CM

## 2012-10-29 DIAGNOSIS — F329 Major depressive disorder, single episode, unspecified: Secondary | ICD-10-CM

## 2012-10-29 DIAGNOSIS — E039 Hypothyroidism, unspecified: Secondary | ICD-10-CM

## 2012-10-29 DIAGNOSIS — F4321 Adjustment disorder with depressed mood: Secondary | ICD-10-CM

## 2012-10-29 NOTE — Assessment & Plan Note (Signed)
The patient has a history of hypercholesterolemia.  She is not on statins.  She is very careful with her diet.  We'll plan to update her fasting labs at her next visit

## 2012-10-29 NOTE — Patient Instructions (Signed)
Your physician recommends that you continue on your current medications as directed. Please refer to the Current Medication list given to you today.  Your physician wants you to follow-up in: 4 months with fasting labs (lp/bmet/hfp/tsh/ft4) You will receive a reminder letter in the mail two months in advance. If you don't receive a letter, please call our office to schedule the follow-up appointment.

## 2012-10-29 NOTE — Assessment & Plan Note (Signed)
Her situational depression related to the death of her husband several years ago seems to have responded to low-dose Zoloft 25 mg daily

## 2012-10-29 NOTE — Progress Notes (Signed)
Danielle Bryan Date of Birth:  1924-01-31 [facility] [address] [place], [place]  [address] [phone]  Fax   [phone]  HPI: This pleasant 79 year old woman is seen for a scheduled followup office visit. She has a past history of hypothyroidism, hypercholesterolemia, and essential hypertension. She also has a history of reactive depression. She has been depressed since her husband died several years ago. Since last visit she has been experiencing some intermittent lower left anterior chest discomfort. She has tenderness along the costochondral margin on the left.  She was seen by the emergency room staff at [facility] on 09/26/12 after having an episode of syncope.  She was worked up in the emergency room and released.  She was felt to have vasovagal syncope related to an overheated environment.  She has had no recurrent symptoms of dizziness or syncope.. in the emergency room she had lab work drawn which was unremarkable and a chest x-ray showed chronic interstitial markings/emphysematous changes but no evidence of any acute cardiopulmonary disease electrocardiogram at the time of this post syncope was normal and there were no arrhythmias she was felt to be possibly dehydrated and was given 1 L of IV fluids and discharged  Current Outpatient Prescriptions  Medication Sig Dispense Refill  . Multiple Vitamins-Minerals (CENTRUM PO) Take 1 tablet by mouth daily.      Marland Kitchen amLODipine (NORVASC) 5 MG tablet Take 1 tablet (5 mg total) by mouth daily.  90 tablet  3  . aspirin 81 MG tablet Take 81 mg by mouth daily.        . Cyanocobalamin (VITAMIN B 12 PO) Take 1 tablet by mouth daily.       Marland Kitchen levothyroxine (SYNTHROID, LEVOTHROID) 100 MCG tablet Take 100 mcg by mouth daily. Taking 6 days a week. Don't take on sundays      . Omega-3 Fatty Acids (FISH OIL PO) Take 1 tablet by mouth 2 (two) times daily.       . sertraline (ZOLOFT) 25 MG tablet Take 1 tablet (25 mg total) by  mouth daily.  90 tablet  0   No current facility-administered medications for this visit.    Allergies  Allergen Reactions  . Escitalopram Oxalate     Ha,nausea  . Lisinopril   . Statins     myalgias  . Sulfa Antibiotics     hives    Patient Active Problem List   Diagnosis Date Noted  . Hypercholesterolemia     Priority: High  . Reactive depression (situational)     Priority: High  . Benign hypertensive heart disease without heart failure 08/20/2010  . Hypothyroidism   . Osteoarthritis     History  Smoking status  . Never Smoker   Smokeless tobacco  . Not on file    History  Alcohol Use     Family History  Problem Relation Age of Onset  . Heart failure Mother   . Pneumonia Mother   . Heart failure Father     Review of Systems: The patient denies any heat or cold intolerance.  No weight gain or weight loss.  The patient denies headaches or blurry vision.  There is no cough or sputum production.  The patient denies dizziness.  There is no hematuria or hematochezia.  The patient denies any muscle aches or arthritis.  The patient denies any rash.  The patient denies frequent falling or instability.  There is no history of depression or anxiety.  All other  systems were reviewed and are negative.   Physical Exam: Filed Vitals:   10/29/12 1614  BP: 126/56  Pulse: 96   the general appearance reveals a well-developed elderly woman in no distress.The head and neck exam reveals pupils equal and reactive.  Extraocular movements are full.  There is no scleral icterus.  The mouth and pharynx are normal.  The neck is supple.  The carotids reveal no bruits.  The jugular venous pressure is normal.  The  thyroid is not enlarged.  There is no lymphadenopathy.  The chest is clear to percussion and auscultation.  There are no rales or rhonchi.  Expansion of the chest is symmetrical.  The precordium is quiet.  The first heart sound is normal.  The second heart sound is physiologically  split.  There is no murmur gallop rub or click.  There is no abnormal lift or heave.  The abdomen is soft and nontender.  The bowel sounds are normal.  The liver and spleen are not enlarged.  There are no abdominal masses.  There are no abdominal bruits.  Extremities reveal good pedal pulses.  There is no phlebitis or edema.  There is no cyanosis or clubbing.  Strength is normal and symmetrical in all extremities.  There is no lateralizing weakness.  There are no sensory deficits.  The skin is warm and dry.  There is no rash.      Assessment / Plan: Continue same medication.  I encouraged her that she is doing very well with her careful heart healthy diet.  Recheck in 4 months for office visit lipid panel hepatic function panel nasal metabolic panel TSH and free T4.  Clinically she remains euthyroid.

## 2012-10-29 NOTE — Assessment & Plan Note (Signed)
Her blood pressure is remaining stable on current therapy.  No chest pain.  No palpitations.  No symptoms of CHF

## 2012-12-14 ENCOUNTER — Other Ambulatory Visit: Payer: Self-pay | Admitting: *Deleted

## 2012-12-14 DIAGNOSIS — F329 Major depressive disorder, single episode, unspecified: Secondary | ICD-10-CM

## 2012-12-14 DIAGNOSIS — I119 Hypertensive heart disease without heart failure: Secondary | ICD-10-CM

## 2012-12-14 MED ORDER — SERTRALINE HCL 25 MG PO TABS: 25.0000 mg | ORAL_TABLET | Freq: Every day | ORAL | Status: DC

## 2012-12-14 MED ORDER — LEVOTHYROXINE SODIUM 100 MCG PO TABS: 100.0000 ug | ORAL_TABLET | Freq: Every day | ORAL | Status: DC

## 2012-12-14 MED ORDER — AMLODIPINE BESYLATE 5 MG PO TABS: 5.0000 mg | ORAL_TABLET | Freq: Every day | ORAL | Status: DC

## 2012-12-17 ENCOUNTER — Other Ambulatory Visit: Payer: Self-pay | Admitting: *Deleted

## 2013-05-24 ENCOUNTER — Other Ambulatory Visit: Payer: Self-pay | Admitting: *Deleted

## 2013-05-24 ENCOUNTER — Telehealth: Payer: Self-pay | Admitting: Cardiology

## 2013-05-24 DIAGNOSIS — I119 Hypertensive heart disease without heart failure: Secondary | ICD-10-CM

## 2013-05-24 MED ORDER — LEVOTHYROXINE SODIUM 100 MCG PO TABS: 100.0000 ug | ORAL_TABLET | Freq: Every day | ORAL | Status: DC

## 2013-05-24 MED ORDER — AMLODIPINE BESYLATE 5 MG PO TABS: 5.0000 mg | ORAL_TABLET | Freq: Every day | ORAL | Status: DC

## 2013-05-24 NOTE — Telephone Encounter (Deleted)
Error... Pt wanted a refill

## 2013-06-30 ENCOUNTER — Ambulatory Visit: Payer: Medicare Other | Admitting: Cardiology

## 2013-08-17 NOTE — Telephone Encounter (Signed)
error 

## 2013-09-17 ENCOUNTER — Other Ambulatory Visit: Payer: Self-pay | Admitting: Cardiology

## 2013-09-22 ENCOUNTER — Other Ambulatory Visit: Payer: Self-pay | Admitting: Cardiology

## 2013-10-26 ENCOUNTER — Other Ambulatory Visit: Payer: Self-pay | Admitting: Cardiology

## 2013-12-02 ENCOUNTER — Other Ambulatory Visit: Payer: Self-pay | Admitting: Cardiology

## 2014-03-19 ENCOUNTER — Inpatient Hospital Stay (HOSPITAL_COMMUNITY)
Admission: EM | Admit: 2014-03-19 | Discharge: 2014-03-21 | DRG: 069 | Disposition: A | Discharge status: Home Health | Payer: Commercial Managed Care - HMO | Attending: Internal Medicine | Admitting: Internal Medicine

## 2014-03-19 ENCOUNTER — Emergency Department (HOSPITAL_COMMUNITY): Payer: Commercial Managed Care - HMO

## 2014-03-19 ENCOUNTER — Encounter (HOSPITAL_COMMUNITY): Payer: Self-pay | Admitting: Radiology

## 2014-03-19 DIAGNOSIS — G4089 Other seizures: Secondary | ICD-10-CM | POA: Diagnosis present

## 2014-03-19 DIAGNOSIS — I10 Essential (primary) hypertension: Secondary | ICD-10-CM | POA: Diagnosis not present

## 2014-03-19 DIAGNOSIS — G459 Transient cerebral ischemic attack, unspecified: Secondary | ICD-10-CM | POA: Diagnosis not present

## 2014-03-19 DIAGNOSIS — E780 Pure hypercholesterolemia: Secondary | ICD-10-CM | POA: Diagnosis present

## 2014-03-19 DIAGNOSIS — N390 Urinary tract infection, site not specified: Secondary | ICD-10-CM | POA: Diagnosis present

## 2014-03-19 DIAGNOSIS — R4789 Other speech disturbances: Secondary | ICD-10-CM | POA: Diagnosis not present

## 2014-03-19 DIAGNOSIS — R4701 Aphasia: Secondary | ICD-10-CM | POA: Diagnosis not present

## 2014-03-19 DIAGNOSIS — G451 Carotid artery syndrome (hemispheric): Secondary | ICD-10-CM | POA: Diagnosis not present

## 2014-03-19 DIAGNOSIS — Z79899 Other long term (current) drug therapy: Secondary | ICD-10-CM

## 2014-03-19 DIAGNOSIS — E039 Hypothyroidism, unspecified: Secondary | ICD-10-CM | POA: Diagnosis not present

## 2014-03-19 DIAGNOSIS — K219 Gastro-esophageal reflux disease without esophagitis: Secondary | ICD-10-CM | POA: Diagnosis present

## 2014-03-19 DIAGNOSIS — R030 Elevated blood-pressure reading, without diagnosis of hypertension: Secondary | ICD-10-CM | POA: Diagnosis not present

## 2014-03-19 DIAGNOSIS — R4781 Slurred speech: Secondary | ICD-10-CM | POA: Diagnosis not present

## 2014-03-19 DIAGNOSIS — Z7982 Long term (current) use of aspirin: Secondary | ICD-10-CM

## 2014-03-19 DIAGNOSIS — R479 Unspecified speech disturbances: Secondary | ICD-10-CM | POA: Diagnosis not present

## 2014-03-19 DIAGNOSIS — R4182 Altered mental status, unspecified: Secondary | ICD-10-CM | POA: Diagnosis not present

## 2014-03-19 DIAGNOSIS — Z0389 Encounter for observation for other suspected diseases and conditions ruled out: Secondary | ICD-10-CM | POA: Diagnosis not present

## 2014-03-19 DIAGNOSIS — I6789 Other cerebrovascular disease: Secondary | ICD-10-CM | POA: Diagnosis not present

## 2014-03-19 LAB — CBC WITH DIFFERENTIAL/PLATELET
BASOS PCT: 0 % (ref 0–1)
Basophils Absolute: 0 10*3/uL (ref 0.0–0.1)
Eosinophils Absolute: 0.1 10*3/uL (ref 0.0–0.7)
Eosinophils Relative: 2 % (ref 0–5)
HCT: 41.1 % (ref 36.0–46.0)
HEMOGLOBIN: 13.1 g/dL (ref 12.0–15.0)
Lymphocytes Relative: 35 % (ref 12–46)
Lymphs Abs: 1.9 10*3/uL (ref 0.7–4.0)
MCH: 27.9 pg (ref 26.0–34.0)
MCHC: 31.9 g/dL (ref 30.0–36.0)
MCV: 87.4 fL (ref 78.0–100.0)
Monocytes Absolute: 0.5 10*3/uL (ref 0.1–1.0)
Monocytes Relative: 10 % (ref 3–12)
NEUTROS ABS: 2.9 10*3/uL (ref 1.7–7.7)
Neutrophils Relative %: 53 % (ref 43–77)
Platelets: 164 10*3/uL (ref 150–400)
RBC: 4.7 MIL/uL (ref 3.87–5.11)
RDW: 13.5 % (ref 11.5–15.5)
WBC: 5.4 10*3/uL (ref 4.0–10.5)

## 2014-03-19 LAB — COMPREHENSIVE METABOLIC PANEL
ALBUMIN: 4.1 g/dL (ref 3.5–5.2)
ALT: 10 U/L (ref 0–35)
ANION GAP: 13 (ref 5–15)
AST: 26 U/L (ref 0–37)
Alkaline Phosphatase: 70 U/L (ref 39–117)
BUN: 12 mg/dL (ref 6–23)
CHLORIDE: 100 mmol/L (ref 96–112)
CO2: 24 mmol/L (ref 19–32)
CREATININE: 1.01 mg/dL (ref 0.50–1.10)
Calcium: 9.4 mg/dL (ref 8.4–10.5)
GFR calc Af Amer: 55 mL/min — ABNORMAL LOW (ref >90)
GFR calc non Af Amer: 48 mL/min — ABNORMAL LOW (ref >90)
Glucose, Bld: 157 mg/dL — ABNORMAL HIGH (ref 70–99)
POTASSIUM: 3.9 mmol/L (ref 3.5–5.1)
Sodium: 137 mmol/L (ref 135–145)
TOTAL PROTEIN: 6.9 g/dL (ref 6.0–8.3)
Total Bilirubin: 0.5 mg/dL (ref 0.3–1.2)

## 2014-03-19 LAB — URINALYSIS, ROUTINE W REFLEX MICROSCOPIC
Bilirubin Urine: NEGATIVE
GLUCOSE, UA: NEGATIVE mg/dL
Hgb urine dipstick: NEGATIVE
Ketones, ur: NEGATIVE mg/dL
NITRITE: NEGATIVE
PH: 6 (ref 5.0–8.0)
Protein, ur: NEGATIVE mg/dL
SPECIFIC GRAVITY, URINE: 1.006 (ref 1.005–1.030)
UROBILINOGEN UA: 0.2 mg/dL (ref 0.0–1.0)

## 2014-03-19 LAB — URINE MICROSCOPIC-ADD ON

## 2014-03-19 LAB — I-STAT TROPONIN, ED: TROPONIN I, POC: 0 ng/mL (ref 0.00–0.08)

## 2014-03-19 LAB — CBG MONITORING, ED: GLUCOSE-CAPILLARY: 158 mg/dL — AB (ref 70–99)

## 2014-03-19 NOTE — H&P (Signed)
Danielle Bryan is an 81 y.o. female.   Chief Complaint: trouble speaking HPI: Danielle Bryan is a pleasant 81 yo female who was in her usual state of health until about 6 p.m.. Her son came over for supper and noted That she could not speak intelligibly.  She states she knew what she wanted to say but she could not get her words out. There were no other stroke symptoms.  She did not have facial droop or focal weakness. No visual deficits. EMS was called and within one hour Her speech normalized.  She will admitted for possible TIA.  Past Medical History  Diagnosis Date  . Hypertension-but off meds for low bp . Yesterday at home 140 sbp.  Off bp meds for 3-4 mos due to bp being lower.   . Hypothyroidism   . Osteoarthritis   . Hypercholesterolemia   . Reactive depression (situational)   GERD  Surgeries:  D and C 1957 Tonsillectomy 1947 1972 left eye cataracts removed 1996 right eye cataract removed.  Family History  Problem Relation Age of Onset  . Heart failure Mother   . Pneumonia Mother   . Heart failure Father    Social History:  reports that she has never smoked. She does not have any smokeless tobacco history on file. Her alcohol and drug histories are not on file. Widowed 4 years ago [place].   Danielle Bryan son is with her. Another son Danielle Bryan.  GC one,  GGC none  Allergies:  Allergies  Allergen Reactions  . Escitalopram Oxalate     Ha,nausea  . Lisinopril   . Statins     myalgias  . Sulfa Antibiotics     hives   Home meds:   See med rec. Pantoprazole daily Synthroid daily 81 mg asa daily  Results for orders placed or performed during the hospital encounter of 03/19/14 (from the past 48 hour(s))  CBC with Differential     Status: None   Collection Time: 03/19/14  7:40 PM  Result Value Ref Range   WBC 5.4 4.0 - 10.5 K/uL   RBC 4.70 3.87 - 5.11 MIL/uL   Hemoglobin 13.1 12.0 - 15.0 g/dL   HCT 41.1 36.0 - 46.0 %   MCV 87.4 78.0 - 100.0 fL   MCH 27.9 26.0 - 34.0 pg   MCHC 31.9 30.0  - 36.0 g/dL   RDW 13.5 11.5 - 15.5 %   Platelets 164 150 - 400 K/uL   Neutrophils Relative % 53 43 - 77 %   Neutro Abs 2.9 1.7 - 7.7 K/uL   Lymphocytes Relative 35 12 - 46 %   Lymphs Abs 1.9 0.7 - 4.0 K/uL   Monocytes Relative 10 3 - 12 %   Monocytes Absolute 0.5 0.1 - 1.0 K/uL   Eosinophils Relative 2 0 - 5 %   Eosinophils Absolute 0.1 0.0 - 0.7 K/uL   Basophils Relative 0 0 - 1 %   Basophils Absolute 0.0 0.0 - 0.1 K/uL  Comprehensive metabolic panel     Status: Abnormal   Collection Time: 03/19/14  7:40 PM  Result Value Ref Range   Sodium 137 135 - 145 mmol/L   Potassium 3.9 3.5 - 5.1 mmol/L   Chloride 100 96 - 112 mmol/L   CO2 24 19 - 32 mmol/L   Glucose, Bld 157 (H) 70 - 99 mg/dL   BUN 12 6 - 23 mg/dL   Creatinine, Ser 1.01 0.50 - 1.10 mg/dL   Calcium 9.4 8.4 - 10.5  mg/dL   Total Protein 6.9 6.0 - 8.3 g/dL   Albumin 4.1 3.5 - 5.2 g/dL   AST 26 0 - 37 U/L   ALT 10 0 - 35 U/L   Alkaline Phosphatase 70 39 - 117 U/L   Total Bilirubin 0.5 0.3 - 1.2 mg/dL   GFR calc non Af Amer 48 (L) >90 mL/min   GFR calc Af Amer 55 (L) >90 mL/min    Comment: (NOTE) The eGFR has been calculated using the CKD EPI equation. This calculation has not been validated in all clinical situations. eGFR's persistently <90 mL/min signify possible Chronic Kidney Disease.    Anion gap 13 5 - 15  CBG monitoring, ED     Status: Abnormal   Collection Time: 03/19/14  7:41 PM  Result Value Ref Range   Glucose-Capillary 158 (H) 70 - 99 mg/dL  I-stat troponin, ED     Status: None   Collection Time: 03/19/14  7:58 PM  Result Value Ref Range   Troponin i, poc 0.00 0.00 - 0.08 ng/mL   Comment 3            Comment: Due to the release kinetics of cTnI, a negative result within the first hours of the onset of symptoms does not rule out myocardial infarction with certainty. If myocardial infarction is still suspected, repeat the test at appropriate intervals.   Urinalysis, Routine w reflex microscopic      Status: Abnormal   Collection Time: 03/19/14  8:45 PM  Result Value Ref Range   Color, Urine YELLOW YELLOW   APPearance CLEAR CLEAR   Specific Gravity, Urine 1.006 1.005 - 1.030   pH 6.0 5.0 - 8.0   Glucose, UA NEGATIVE NEGATIVE mg/dL   Hgb urine dipstick NEGATIVE NEGATIVE   Bilirubin Urine NEGATIVE NEGATIVE   Ketones, ur NEGATIVE NEGATIVE mg/dL   Protein, ur NEGATIVE NEGATIVE mg/dL   Urobilinogen, UA 0.2 0.0 - 1.0 mg/dL   Nitrite NEGATIVE NEGATIVE   Leukocytes, UA SMALL (A) NEGATIVE  Urine microscopic-add on     Status: None   Collection Time: 03/19/14  8:45 PM  Result Value Ref Range   Squamous Epithelial / LPF RARE RARE   WBC, UA 3-6 <3 WBC/hpf   Bacteria, UA RARE RARE   Dg Chest 2 View  03/19/2014   CLINICAL DATA:  Altered mental status. Hx HTN.  EXAM: CHEST  2 VIEW  COMPARISON:  09/26/2012  FINDINGS: Cardiac silhouette normal in size and configuration. No mediastinal or hilar masses or convincing adenopathy. Lungs are mildly hyperexpanded. There are chronic areas of interstitial thickening and parenchymal scarring. No lung consolidation or edema. No pleural effusion or pneumothorax.  Bony thorax is demineralized but grossly intact.  IMPRESSION: No acute cardiopulmonary disease.   Electronically Signed   By: Lajean Manes M.D.   On: 03/19/2014 20:32   Ct Head Wo Contrast  03/19/2014   CLINICAL DATA:  PT family states she is unable to complete her thoughts Patient said she knew what she wanted to say, but she couldn't say it.  EXAM: CT HEAD WITHOUT CONTRAST  TECHNIQUE: Contiguous axial images were obtained from the base of the skull through the vertex without intravenous contrast.  COMPARISON:  05/23/2010  FINDINGS: Ventricles normal in configuration. There is ventricular and sulcal enlargement reflecting age related atrophy. No hydrocephalus.  Patchy white matter hypoattenuation is noted consistent with mild to moderate chronic microvascular ischemic change.  No evidence of a cortical  infarct.  There are no  extra-axial masses or abnormal fluid collections.  There is no intracranial hemorrhage.  Visualized sinuses and mastoid air cells are clear.  IMPRESSION: 1. No acute intracranial abnormalities. 2. Age related atrophy. Mild to moderate chronic microvascular ischemic change.   Electronically Signed   By: Lajean Manes M.D.   On: 03/19/2014 20:03    VGJ:FTNBZ independently. ADLs all independent.  No cp, no sob,  No edema. Bowel and bladder function good lately. No blood noted.  Blood pressure 154/71, pulse 78, temperature 98.4 F (36.9 C), temperature source Oral, resp. rate 13, height $RemoveBe'5\' 2"'WvSEorWop$  (1.575 m), weight 61.236 kg (135 lb), SpO2 99 %.  age appropriate female. NAD, alert and oriented times 4.  moe times four with grossly nL strength. follows commands well. she has no pallor or icterus. lungs cta bilat. no w/r/r. heart is rrr no m/r/g. abd soft, nt, nd, no mass or hsm. no edema.    Assessment/Plan 81 yo female with classic TIA symptoms with expressive aphasia.  This has improved.  We will admit.  Get mri brain.  Get carotid dopplers.  Tele to look for afib.  Change asa to plavix.  Consider new statin therapy per pcp later (? Low dose livalo).  Full code status confirmed with the patient.  Jerlyn Ly, MD 03/19/2014, 10:31 PM

## 2014-03-19 NOTE — ED Notes (Signed)
Patient transported to X-ray 

## 2014-03-19 NOTE — ED Notes (Signed)
Pt son spoke with her at 4pm on phone and states pt was at baseline , when pt son  came over at 6 pm  For dinner he states she was no longer at baseline.PT family  states she has been under stress. Denies any stroke symptoms . PT family states she is unable to complete her thoughts . CBG 157 99 RA

## 2014-03-19 NOTE — ED Provider Notes (Signed)
CSN: [id number]     Arrival date & time 03/19/14  1906 History   First MD Initiated Contact with Patient 03/19/14 1919     No chief complaint on file.    (Consider location/radiation/quality/duration/timing/severity/associated sxs/prior Treatment) Patient is a 81 y.o. female presenting with altered mental status.  Altered Mental Status Presenting symptoms: confusion and disorientation   Severity:  Severe Most recent episode:  Today Episode history:  Continuous Duration:  1 hour Timing:  Constant Progression:  Resolved Chronicity:  New Context: recent change in medication (dc of anti HTN med)   Context: not dementia and not head injury   Associated symptoms: slurred speech   Associated symptoms: no abdominal pain, no bladder incontinence, no fever, no nausea and no vomiting     Past Medical History  Diagnosis Date  . Hypertension   . Hypothyroidism   . Osteoarthritis   . Hypercholesterolemia   . Reactive depression (situational)    No past surgical history on file. Family History  Problem Relation Age of Onset  . Heart failure Mother   . Pneumonia Mother   . Heart failure Father    History  Substance Use Topics  . Smoking status: Never Smoker   . Smokeless tobacco: Not on file  . Alcohol Use: Not on file   OB History    No data available     Review of Systems  Constitutional: Negative for fever.  Gastrointestinal: Negative for nausea, vomiting and abdominal pain.  Genitourinary: Negative for bladder incontinence.  Psychiatric/Behavioral: Positive for confusion.  All other systems reviewed and are negative.     Allergies  Escitalopram oxalate; Lisinopril; Statins; and Sulfa antibiotics  Home Medications   Prior to Admission medications   Medication Sig Start Date End Date Taking? Authorizing Provider  aspirin 81 MG tablet Take 81 mg by mouth daily.     Yes Historical Provider, MD  cetaphil (CETAPHIL) cream Apply 1 application topically 2 (two) times  daily as needed (dryness).   Yes Historical Provider, MD  clobetasol cream (TEMOVATE) 4.78 % Apply 1 application topically 2 (two) times daily.   Yes Historical Provider, MD  levothyroxine (SYNTHROID, LEVOTHROID) 100 MCG tablet TAKE 1 TABLET (100 MCG TOTAL) BY MOUTH DAILY. TAKING 6 DAYS A WEEK. DON'T TAKE ON SUNDAYS   Yes Darlin Coco, MD  Multiple Vitamins-Minerals (CENTRUM PO) Take 1 tablet by mouth daily.   Yes Historical Provider, MD  Omega-3 Fatty Acids (FISH OIL PO) Take 1 tablet by mouth 2 (two) times daily.    Yes Historical Provider, MD  pantoprazole (PROTONIX) 40 MG tablet Take 40 mg by mouth daily.   Yes Historical Provider, MD   BP 175/59 mmHg  Pulse 103  Temp(Src) 98.4 F (36.9 C) (Oral)  Resp 16  Ht 5\' 2"  (1.575 m)  Wt 135 lb (61.236 kg)  BMI 24.69 kg/m2  SpO2 98% Physical Exam  Constitutional: She is oriented to person, place, and time. She appears well-developed and well-nourished.  HENT:  Head: Normocephalic and atraumatic.  Right Ear: External ear normal.  Left Ear: External ear normal.  Eyes: Conjunctivae and EOM are normal. Pupils are equal, round, and reactive to light.  Neck: Normal range of motion. Neck supple.  Cardiovascular: Normal rate, regular rhythm, normal heart sounds and intact distal pulses.   Pulmonary/Chest: Effort normal and breath sounds normal.  Abdominal: Soft. Bowel sounds are normal. There is no tenderness.  Musculoskeletal: Normal range of motion.  Neurological: She is alert and oriented to person, place,  and time. She has normal strength and normal reflexes. No cranial nerve deficit or sensory deficit. Coordination normal. GCS eye subscore is 4. GCS verbal subscore is 5. GCS motor subscore is 6.  Skin: Skin is warm and dry.  Vitals reviewed.   ED Course  Procedures (including critical care time) Labs Review Labs Reviewed  COMPREHENSIVE METABOLIC PANEL - Abnormal; Notable for the following:    Glucose, Bld 157 (*)    GFR calc non Af  Amer 48 (*)    GFR calc Af Amer 55 (*)    All other components within normal limits  URINALYSIS, ROUTINE W REFLEX MICROSCOPIC - Abnormal; Notable for the following:    Leukocytes, UA SMALL (*)    All other components within normal limits  CBG MONITORING, ED - Abnormal; Notable for the following:    Glucose-Capillary 158 (*)    All other components within normal limits  CBC WITH DIFFERENTIAL/PLATELET  URINE MICROSCOPIC-ADD ON  CBG MONITORING, ED  Randolm Idol, ED    Imaging Review Dg Chest 2 View  03/19/2014   CLINICAL DATA:  Altered mental status. Hx HTN.  EXAM: CHEST  2 VIEW  COMPARISON:  09/26/2012  FINDINGS: Cardiac silhouette normal in size and configuration. No mediastinal or hilar masses or convincing adenopathy. Lungs are mildly hyperexpanded. There are chronic areas of interstitial thickening and parenchymal scarring. No lung consolidation or edema. No pleural effusion or pneumothorax.  Bony thorax is demineralized but grossly intact.  IMPRESSION: No acute cardiopulmonary disease.   Electronically Signed   By: Lajean Manes M.D.   On: 03/19/2014 20:32   Ct Head Wo Contrast  03/19/2014   CLINICAL DATA:  PT family states she is unable to complete her thoughts Patient said she knew what she wanted to say, but she couldn't say it.  EXAM: CT HEAD WITHOUT CONTRAST  TECHNIQUE: Contiguous axial images were obtained from the base of the skull through the vertex without intravenous contrast.  COMPARISON:  05/23/2010  FINDINGS: Ventricles normal in configuration. There is ventricular and sulcal enlargement reflecting age related atrophy. No hydrocephalus.  Patchy white matter hypoattenuation is noted consistent with mild to moderate chronic microvascular ischemic change.  No evidence of a cortical infarct.  There are no extra-axial masses or abnormal fluid collections.  There is no intracranial hemorrhage.  Visualized sinuses and mastoid air cells are clear.  IMPRESSION: 1. No acute intracranial  abnormalities. 2. Age related atrophy. Mild to moderate chronic microvascular ischemic change.   Electronically Signed   By: Lajean Manes M.D.   On: 03/19/2014 20:03     EKG Interpretation None      MDM   Final diagnoses:  TIA (transient ischemic attack)    81 y.o. female with pertinent PMH of HTN, HLD presents with altered mental status. Patient was in her normal state of health at approximately 4 PM, her son arrived around 6 PM and the patient became progressively aphasic and confused. She had difficulty expressing words. On EMS arrival patient was in the same state. On arrival to the ED the patient has vital signs and physical exam as above. No symptoms at time of my exam. CBG by EMS 150.  She was also hypertensive to 671 systolic. Given resolving symptoms consider hypertensive encephalopathy versus TIA likely etiology. Workup obtained as above.  Consulted neurology and medicine for admission  I have reviewed all laboratory and imaging studies if ordered as above  1. TIA (transient ischemic attack)  Debby Freiberg, MD 03/20/14 [id number]

## 2014-03-20 ENCOUNTER — Inpatient Hospital Stay (HOSPITAL_COMMUNITY): Payer: Commercial Managed Care - HMO

## 2014-03-20 ENCOUNTER — Encounter (HOSPITAL_COMMUNITY): Payer: Self-pay | Admitting: *Deleted

## 2014-03-20 DIAGNOSIS — G459 Transient cerebral ischemic attack, unspecified: Principal | ICD-10-CM

## 2014-03-20 DIAGNOSIS — G451 Carotid artery syndrome (hemispheric): Secondary | ICD-10-CM

## 2014-03-20 MED ORDER — LEVOTHYROXINE SODIUM 100 MCG PO TABS
100.0000 ug | ORAL_TABLET | Freq: Every day | ORAL | Status: DC
  Administered 2014-03-20 – 2014-03-21 (×2): 100 ug via ORAL
  Filled 2014-03-20 (×2): qty 1

## 2014-03-20 MED ORDER — PANTOPRAZOLE SODIUM 40 MG PO TBEC
40.0000 mg | DELAYED_RELEASE_TABLET | Freq: Every day | ORAL | Status: DC
  Administered 2014-03-20 – 2014-03-21 (×2): 40 mg via ORAL
  Filled 2014-03-20 (×2): qty 1

## 2014-03-20 MED ORDER — ACETAMINOPHEN 325 MG PO TABS
650.0000 mg | ORAL_TABLET | Freq: Four times a day (QID) | ORAL | Status: DC | PRN
  Administered 2014-03-20 – 2014-03-21 (×2): 650 mg via ORAL
  Filled 2014-03-20 (×2): qty 2

## 2014-03-20 MED ORDER — ENOXAPARIN SODIUM 40 MG/0.4ML ~~LOC~~ SOLN
40.0000 mg | Freq: Every day | SUBCUTANEOUS | Status: DC
  Administered 2014-03-20 – 2014-03-21 (×2): 40 mg via SUBCUTANEOUS
  Filled 2014-03-20 (×2): qty 0.4

## 2014-03-20 MED ORDER — ACETAMINOPHEN 650 MG RE SUPP: 650.0000 mg | Freq: Four times a day (QID) | RECTAL | Status: DC | PRN

## 2014-03-20 MED ORDER — HYDRALAZINE HCL 20 MG/ML IJ SOLN: 10.0000 mg | Freq: Four times a day (QID) | INTRAMUSCULAR | Status: DC | PRN

## 2014-03-20 MED ORDER — SODIUM CHLORIDE 0.9 % IJ SOLN
3.0000 mL | Freq: Two times a day (BID) | INTRAMUSCULAR | Status: DC
  Administered 2014-03-20 – 2014-03-21 (×3): 3 mL via INTRAVENOUS

## 2014-03-20 MED ORDER — CLOPIDOGREL BISULFATE 75 MG PO TABS
75.0000 mg | ORAL_TABLET | Freq: Every day | ORAL | Status: DC
  Administered 2014-03-20 – 2014-03-21 (×2): 75 mg via ORAL
  Filled 2014-03-20 (×2): qty 1

## 2014-03-20 MED ORDER — SODIUM CHLORIDE 0.9 % IJ SOLN
3.0000 mL | Freq: Two times a day (BID) | INTRAMUSCULAR | Status: DC
  Administered 2014-03-20: 3 mL via INTRAVENOUS

## 2014-03-20 NOTE — Progress Notes (Signed)
Chaplain initiated visit with pt. Pt explained to chaplain the tests she has gone through and that she is awaiting results. Pt is an avid reader and enjoys a good "story." Chaplain offered emotional support and prayer. Pt appreciative of chaplain visit. Chaplain informed pt of her services should she need them. Page chaplain as needed.    03/20/14 1400  Clinical Encounter Type  Visited With Patient  Visit Type Initial;Spiritual support  Spiritual Encounters  Spiritual Needs Emotional;Prayer  Stress Factors  Patient Stress Factors Lack of knowledge  Rolly Salter, Chaplain 03/20/2014 2:22 PM

## 2014-03-20 NOTE — Evaluation (Signed)
Occupational Therapy Evaluation and Discharge Patient Details Name: Danielle Bryan MRN: [id number] DOB: 09-May-1923 Today's Date: 03/20/2014    History of Present Illness She could not speak intelligibly.  She states she knew what she wanted to say but she could not get her words out.   Clinical Impression   This 81 yo female admitted with above presents to acute OT at an independent to Mod I level, no further OT needs, we will sign off.    Follow Up Recommendations  No OT follow up    Equipment Recommendations  None recommended by OT       Precautions / Restrictions Precautions Precautions: None      Mobility Bed Mobility Overal bed mobility: Independent                Transfers Overall transfer level: Modified independent (increased time when surfaces are lower)                    Balance Overall balance assessment: No apparent balance deficits (not formally assessed) (for BADLs assessed)                                          ADL Overall ADL's : Modified independent                                                       Pertinent Vitals/Pain Pain Assessment: No/denies pain     Hand Dominance Right   Extremity/Trunk Assessment Upper Extremity Assessment Upper Extremity Assessment: Overall WFL for tasks assessed           Communication Communication Communication: No difficulties   Cognition Arousal/Alertness: Awake/alert Behavior During Therapy: WFL for tasks assessed/performed Overall Cognitive Status: Within Functional Limits for tasks assessed                                Home Living Family/patient expects to be discharged to:: Private residence Living Arrangements: Alone Available Help at Discharge: Family;Available PRN/intermittently Type of Home: House Home Access: Stairs to enter [employer] of Steps: 3 Entrance Stairs-Rails: None Home Layout: One level      Bathroom Shower/Tub:  (sponge bathes)   Bathroom Toilet: Standard     Home Equipment: Bedside commode;Walker - 2 wheels;Cane - single point;Transport chair          Prior Functioning/Environment Level of Independence: Independent        Comments: Has not driven over the past several weeks due to her son does not think she should be driving    OT Diagnosis: Generalized weakness         OT Goals(Current goals can be found in the care plan section) Acute Rehab OT Goals Patient Stated Goal: to hopefully figure out what caused me speech issues  OT Frequency:                End of Session    Activity Tolerance: Patient tolerated treatment well Patient left:  (Sitting EOB getting ready to get started with PT)   Time: 5956-3875 OT Time Calculation (min): 13 min Charges:  OT General Charges $OT Visit: 1 Procedure OT Evaluation $Initial OT  Evaluation Tier I: 1 Procedure  Almon Register [id number] 03/20/2014, 3:48 PM

## 2014-03-20 NOTE — Progress Notes (Signed)
*  PRELIMINARY RESULTS* Vascular Ultrasound Carotid Duplex (Doppler) has been completed.   Findings suggest 1-39% internal carotid artery stenosis bilaterally. Vertebral arteries are patent with antegrade flow.  Transcranial Doppler has been completed.    03/20/2014 6:57 PM Maudry Mayhew, RVT, RDCS, RDMS

## 2014-03-20 NOTE — Consult Note (Signed)
Referring Physician: ED    Chief Complaint: expressive aphasia  HPI:                                                                                                                                         Danielle Bryan is an 81 y.o. female with a past medical history that is relevant for HTN, hypercholesterolemia, OA, admitted to [facility] due to inability to speak. Never had similar problem before. Patient was in her usual state of health until approximately 6 pm yesterday when her son came over for supper and noted that she was not able to express herself as she normally does. She said that she knew that she want it to say but couldn't get it out. That episode lasted for approximately 1.5 hours and resolved. She denies associated HA, vertigo, double vision, difficulty swallowing, focal weakness or numbness, or vision impairment. I reviewed CT brain performed in the ED last night showed no acute abnormality. I was informed by patient's nurse that since arrival to the floor she has been having non fluent, hesitant language. Danielle Bryan said that " I don't know what is going on but I can not speak well anymore". On plavix.  Date last known well: 03/19/14 Time last known well: 6 pm tPA Given: no, symptoms iitially resolved in the ED and now out of the window.   Past Medical History  Diagnosis Date  . Hypertension   . Hypothyroidism   . Osteoarthritis   . Hypercholesterolemia   . Reactive depression (situational)     No past surgical history on file.  Family History  Problem Relation Age of Onset  . Heart failure Mother   . Pneumonia Mother   . Heart failure Father   Family history: no brain tumor, brain aneurysms, or epilepy Social History:  reports that she has never smoked. She does not have any smokeless tobacco history on file. Her alcohol and drug histories are not on file.  Allergies:  Allergies  Allergen Reactions  . Escitalopram Oxalate     Ha,nausea  . Lisinopril Other (See  Comments)    unknown  . Statins     myalgias  . Sulfa Antibiotics     hives    Medications:  Scheduled: . clopidogrel  75 mg Oral Daily  . enoxaparin (LOVENOX) injection  40 mg Subcutaneous Daily  . levothyroxine  100 mcg Oral QAC breakfast  . pantoprazole  40 mg Oral Q0600  . sodium chloride  3 mL Intravenous Q12H  . sodium chloride  3 mL Intravenous Q12H    ROS:                                                                                                                                       History obtained from the patient and chart review  General ROS: negative for - chills, fatigue, fever, night sweats, weight gain or weight loss Psychological ROS: negative for - behavioral disorder, hallucinations, or suicidal ideation Ophthalmic ROS: negative for - blurry vision, double vision, eye pain or loss of vision ENT ROS: negative for - epistaxis, nasal discharge, oral lesions, sore throat, tinnitus or vertigo Allergy and Immunology ROS: negative for - hives or itchy/watery eyes Hematological and Lymphatic ROS: negative for - bleeding problems, bruising or swollen lymph nodes Endocrine ROS: negative for - galactorrhea, hair pattern changes, polydipsia/polyuria or temperature intolerance Respiratory ROS: negative for - cough, hemoptysis, shortness of breath or wheezing Cardiovascular ROS: negative for - chest pain, dyspnea on exertion, edema or irregular heartbeat Gastrointestinal ROS: negative for - abdominal pain, diarrhea, hematemesis, nausea/vomiting or stool incontinence Genito-Urinary ROS: negative for - dysuria, hematuria, incontinence or urinary frequency/urgency Musculoskeletal ROS: negative for - joint swelling or muscular weakness Neurological ROS: as noted in HPI Dermatological ROS: negative for rash and skin lesion changes  Physical exam: pleasant  female in no apparent distress. Blood pressure 175/59, pulse 103, temperature 98.4 F (36.9 C), temperature source Oral, resp. rate 16, height _0  (1.575 m), weight 61.236 kg (135 lb), SpO2 98 %. Head: normocephalic. Neck: supple, no bruits, no JVD. Cardiac: no murmurs. Lungs: clear. Abdomen: soft, no tender, no mass. Extremities: no edema. Skin: no edema General: Mental Status: Alert, oriented, thought content appropriate. No dysarthria but non fluent spontaneous language.  Able to follow 3 step commands without difficulty. Cranial Nerves: II: Discs flat bilaterally; Visual fields grossly normal, pupils equal, round, reactive to light and accommodation III,IV, VI: ptosis not present, extra-ocular motions intact bilaterally V,VII: smile symmetric, facial light touch sensation normal bilaterally VIII: hearing normal bilaterally IX,X: gag reflex present XI: bilateral shoulder shrug XII: midline tongue extension without atrophy or fasciculations  Motor: Right : Upper extremity   5/5    Left:     Upper extremity   5/5  Lower extremity   5/5     Lower extremity   5/5 Tone and bulk:normal tone throughout; no atrophy noted Sensory: Pinprick and light touch intact throughout, bilaterally Deep Tendon Reflexes:  1 all over Plantars: Right: downgoing   Left: downgoing Cerebellar: normal finger-to-nose,  normal heel-to-shin test Gait:  No tested for safety reasons   Neurologic Examination:  Results for orders placed or performed during the hospital encounter of 03/19/14 (from the past 48 hour(s))  CBC with Differential     Status: None   Collection Time: 03/19/14  7:40 PM  Result Value Ref Range   WBC 5.4 4.0 - 10.5 K/uL   RBC 4.70 3.87 - 5.11 MIL/uL   Hemoglobin 13.1 12.0 - 15.0 g/dL   HCT 41.1 36.0 - 46.0 %   MCV 87.4 78.0 - 100.0 fL   MCH 27.9 26.0 - 34.0 pg   MCHC 31.9 30.0 -  36.0 g/dL   RDW 13.5 11.5 - 15.5 %   Platelets 164 150 - 400 K/uL   Neutrophils Relative % 53 43 - 77 %   Neutro Abs 2.9 1.7 - 7.7 K/uL   Lymphocytes Relative 35 12 - 46 %   Lymphs Abs 1.9 0.7 - 4.0 K/uL   Monocytes Relative 10 3 - 12 %   Monocytes Absolute 0.5 0.1 - 1.0 K/uL   Eosinophils Relative 2 0 - 5 %   Eosinophils Absolute 0.1 0.0 - 0.7 K/uL   Basophils Relative 0 0 - 1 %   Basophils Absolute 0.0 0.0 - 0.1 K/uL  Comprehensive metabolic panel     Status: Abnormal   Collection Time: 03/19/14  7:40 PM  Result Value Ref Range   Sodium 137 135 - 145 mmol/L   Potassium 3.9 3.5 - 5.1 mmol/L   Chloride 100 96 - 112 mmol/L   CO2 24 19 - 32 mmol/L   Glucose, Bld 157 (H) 70 - 99 mg/dL   BUN 12 6 - 23 mg/dL   Creatinine, Ser 1.01 0.50 - 1.10 mg/dL   Calcium 9.4 8.4 - 10.5 mg/dL   Total Protein 6.9 6.0 - 8.3 g/dL   Albumin 4.1 3.5 - 5.2 g/dL   AST 26 0 - 37 U/L   ALT 10 0 - 35 U/L   Alkaline Phosphatase 70 39 - 117 U/L   Total Bilirubin 0.5 0.3 - 1.2 mg/dL   GFR calc non Af Amer 48 (L) >90 mL/min   GFR calc Af Amer 55 (L) >90 mL/min    Comment: (NOTE) The eGFR has been calculated using the CKD EPI equation. This calculation has not been validated in all clinical situations. eGFR's persistently <90 mL/min signify possible Chronic Kidney Disease.    Anion gap 13 5 - 15  CBG monitoring, ED     Status: Abnormal   Collection Time: 03/19/14  7:41 PM  Result Value Ref Range   Glucose-Capillary 158 (H) 70 - 99 mg/dL  I-stat troponin, ED     Status: None   Collection Time: 03/19/14  7:58 PM  Result Value Ref Range   Troponin i, poc 0.00 0.00 - 0.08 ng/mL   Comment 3            Comment: Due to the release kinetics of cTnI, a negative result within the first hours of the onset of symptoms does not rule out myocardial infarction with certainty. If myocardial infarction is still suspected, repeat the test at appropriate intervals.   Urinalysis, Routine w reflex microscopic      Status: Abnormal   Collection Time: 03/19/14  8:45 PM  Result Value Ref Range   Color, Urine YELLOW YELLOW   APPearance CLEAR CLEAR   Specific Gravity, Urine 1.006 1.005 - 1.030   pH 6.0 5.0 - 8.0   Glucose, UA NEGATIVE NEGATIVE mg/dL   Hgb urine dipstick NEGATIVE NEGATIVE   Bilirubin  Urine NEGATIVE NEGATIVE   Ketones, ur NEGATIVE NEGATIVE mg/dL   Protein, ur NEGATIVE NEGATIVE mg/dL   Urobilinogen, UA 0.2 0.0 - 1.0 mg/dL   Nitrite NEGATIVE NEGATIVE   Leukocytes, UA SMALL (A) NEGATIVE  Urine microscopic-add on     Status: None   Collection Time: 03/19/14  8:45 PM  Result Value Ref Range   Squamous Epithelial / LPF RARE RARE   WBC, UA 3-6 <3 WBC/hpf   Bacteria, UA RARE RARE   Dg Chest 2 View  03/19/2014   CLINICAL DATA:  Altered mental status. Hx HTN.  EXAM: CHEST  2 VIEW  COMPARISON:  09/26/2012  FINDINGS: Cardiac silhouette normal in size and configuration. No mediastinal or hilar masses or convincing adenopathy. Lungs are mildly hyperexpanded. There are chronic areas of interstitial thickening and parenchymal scarring. No lung consolidation or edema. No pleural effusion or pneumothorax.  Bony thorax is demineralized but grossly intact.  IMPRESSION: No acute cardiopulmonary disease.   Electronically Signed   By: Lajean Manes M.D.   On: 03/19/2014 20:32   Ct Head Wo Contrast  03/19/2014   CLINICAL DATA:  PT family states she is unable to complete her thoughts Patient said she knew what she wanted to say, but she couldn't say it.  EXAM: CT HEAD WITHOUT CONTRAST  TECHNIQUE: Contiguous axial images were obtained from the base of the skull through the vertex without intravenous contrast.  COMPARISON:  05/23/2010  FINDINGS: Ventricles normal in configuration. There is ventricular and sulcal enlargement reflecting age related atrophy. No hydrocephalus.  Patchy white matter hypoattenuation is noted consistent with mild to moderate chronic microvascular ischemic change.  No evidence of a cortical  infarct.  There are no extra-axial masses or abnormal fluid collections.  There is no intracranial hemorrhage.  Visualized sinuses and mastoid air cells are clear.  IMPRESSION: 1. No acute intracranial abnormalities. 2. Age related atrophy. Mild to moderate chronic microvascular ischemic change.   Electronically Signed   By: Lajean Manes M.D.   On: 03/19/2014 20:03    Assessment: 81 y.o. female with recurrent episodes of isolated non fluent spontaneous language without other abnormalities on neuro-exam and CT brain without acute abnormality. Crescendo left brain TIA versus small acute infarct involving primary language area. Agree with TIA/stroke work up. Continue plavix. Stroke team will resume care today.  Stroke Risk Factors - age, HTN, hyperlipidemia  Dorian Pod, MD [facility] [phone]  03/20/2014, 1:44 AM

## 2014-03-20 NOTE — Procedures (Signed)
ELECTROENCEPHALOGRAM REPORT  Patient: Danielle Bryan       Room #: 0X73  EEG No. ID: [id number] Age: 81 y.o.        Sex: female Referring Physician: Hoover Brunette Report Date:  03/20/2014        Interpreting Physician: Anthony Sar  History: Danielle Bryan is an 81 y.o. female with a history of hypertension and hypercholesterolemia, presenting with new onset expressive speech disorder. CT of the head and MRI were negative for acute stroke.  Indications for study:  Rule out focal seizure disorder.  Technique: This is an 18 channel routine scalp EEG performed at the bedside with bipolar and monopolar montages arranged in accordance to the international 10/20 system of electrode placement.   Description: This EEG recording was performed during wakefulness and during drowsiness. Background activity was asymmetric with mild to moderate slowing of cerebral activity involving left hemisphere, with slowing being most pronounced involving the left temporal region. Background activity recorded from the right hemisphere was normal, including 9 Hz alpha rhythm. There were occurrences of generalized moderate slowing of cerebral activity transiently which likely were manifestations of drowsiness. Photic stimulation and hyperventilation were not performed. No frank epileptiform discharges were recorded.   Interpretation: This EEG is abnormal with mild to moderate continuous slowing of radioactivity involving the left cerebral hemisphere, most prominent involving the left temporal region. No clear epileptiform activity was recorded. Please be aware that the lack of frank epileptiform activity in this recording does not rule out an epileptic disorder.   Rush Farmer M.D. [facility] [phone]

## 2014-03-20 NOTE — Progress Notes (Signed)
Pt experiencing slurred speech and expressive aphasia again.  Symptoms had resolved in the ED.  MD on call notified.

## 2014-03-20 NOTE — Progress Notes (Signed)
Received report from ED RN for pt coming to [id number]

## 2014-03-20 NOTE — Progress Notes (Signed)
Physical Therapy Evaluation Patient Details Name: Danielle Bryan MRN: [id number] DOB: 13-May-1923 Today's Date: 03/20/2014   History of Present Illness  Patient is an 81 yo female admitted 03/19/14 with difficulty with her speech.  TIA/CVA work-up underway.  PMH:  HTN, hypothyroidism, OA, HLD, depression  Clinical Impression  Patient presents with problems listed below.  Will benefit from acute PT to maximize independence prior to return home.  Patient with unsteady gait, staggering to both sides.  Loss of balance with high level balance activities such as turning her head during gait.  Patient reports "I don't usually stagger like this".   Recommend f/u HHPT for balance training at discharge for fall prevention.  Recommended to patient to use her cane/RW at home initially until her balance returns.    Follow Up Recommendations Home health PT;Supervision - Intermittent    Equipment Recommendations  None recommended by PT    Recommendations for Other Services       Precautions / Restrictions Precautions Precautions: Fall Restrictions Weight Bearing Restrictions: No      Mobility  Bed Mobility Overal bed mobility: Independent                Transfers Overall transfer level: Modified independent Equipment used: None             General transfer comment: Increased time  Ambulation/Gait Ambulation/Gait assistance: Min guard Ambulation Distance (Feet): 175 Feet Assistive device: None Gait Pattern/deviations: Step-through pattern;Decreased stride length;Staggering left;Staggering right Gait velocity: Decreased Gait velocity interpretation: Below normal speed for age/gender General Gait Details: Patient with slightly unsteady gait, staggering to left and right.  Patient able to self-correct except once required assist to prevent loss of balance.    Stairs            Wheelchair Mobility    Modified Rankin (Stroke Patients Only) Modified Rankin (Stroke Patients  Only) Pre-Morbid Rankin Score: No significant disability Modified Rankin: Moderately severe disability     Balance Overall balance assessment: Needs assistance                           High level balance activites: Turns;Sudden stops;Head turns;Direction changes High Level Balance Comments: Decreased balance with high level balance activities.  Patient turns head and staggers to opposite side.  Unsteady with sudden stops.             Pertinent Vitals/Pain Pain Assessment: No/denies pain    Home Living Family/patient expects to be discharged to:: Private residence Living Arrangements: Alone Available Help at Discharge: Family;Available PRN/intermittently Type of Home: House Home Access: Stairs to enter Entrance Stairs-Rails: None Entrance Stairs-Number of Steps: 3 Home Layout: One level Home Equipment: Bedside commode;Walker - 2 wheels;Cane - single point;Transport chair      Prior Function Level of Independence: Independent         Comments: Reports "I don't do much cleaning"  and that her son runs errands for her.       Hand Dominance   Dominant Hand: Right    Extremity/Trunk Assessment   Upper Extremity Assessment: Defer to OT evaluation           Lower Extremity Assessment: Generalized weakness         Communication   Communication: Expressive difficulties (Occasional difficulty finding words (patient fatigued))  Cognition Arousal/Alertness: Awake/alert Behavior During Therapy: WFL for tasks assessed/performed Overall Cognitive Status: Within Functional Limits for tasks assessed  General Comments      Exercises        Assessment/Plan    PT Assessment Patient needs continued PT services  PT Diagnosis Abnormality of gait   PT Problem List Decreased strength;Decreased activity tolerance;Decreased balance;Decreased mobility;Decreased knowledge of use of DME  PT Treatment Interventions DME  instruction;Gait training;Stair training;Functional mobility training;Therapeutic activities;Balance training;Patient/family education   PT Goals (Current goals can be found in the Care Plan section) Acute Rehab PT Goals Patient Stated Goal: to hopefully figure out what caused me speech issues PT Goal Formulation: With patient Time For Goal Achievement: 03/27/14 Potential to Achieve Goals: Good    Frequency Min 4X/week   Barriers to discharge Decreased caregiver support Patient lives alone    Co-evaluation               End of Session Equipment Utilized During Treatment: Gait belt Activity Tolerance: Patient limited by fatigue Patient left: in bed;with call bell/phone within reach;with bed alarm set Nurse Communication: Mobility status         Time: 2993-7169 PT Time Calculation (min) (ACUTE ONLY): 25 min   Charges:   PT Evaluation $Initial PT Evaluation Tier I: 1 Procedure PT Treatments $Gait Training: 8-22 mins   PT G CodesDespina Pole 2014-03-24, 4:04 PM Carita Pian. Sanjuana Kava, [place] Pager [phone]

## 2014-03-20 NOTE — Progress Notes (Addendum)
Physician Daily Progress Note  Subjective: Had elevated BP and aphasic symptoms overnight  Objective: Vital signs in last 24 hours:   Filed Vitals:   03/20/14 0220 03/20/14 0232 03/20/14 0406 03/20/14 0602  BP: 214/71 204/66 151/56 134/53  Pulse:   95 94  Temp:   99 F (37.2 C) 99.1 F (37.3 C)  TempSrc:   Oral Oral  Resp:   16 16  Height:      Weight:      SpO2:   99% 94%   Weight change:  Last BM Date: 03/18/14  CBG (last 3)   Recent Labs  03/19/14 1941  GLUCAP 158*    Intake/Output from previous day:  Intake/Output Summary (Last 24 hours) at 03/20/14 0705 Last data filed at 03/20/14 0124  Gross per 24 hour  Intake      3 ml  Output      0 ml  Net      3 ml      Physical Exam General appearance: WF in NAD  HEENT:  MMM , anicteric  Resp:  CTAB , no wr   Cardio:  RRR ,no mrg  GI: soft, non-tender; bowel sounds normal; no masses,  no organomegaly Extremities: no clubbing, cyanosis or edema   Labs: Basic Metabolic Panel:  Recent Labs Lab 03/19/14 1940  NA 137  K 3.9  CL 100  CO2 24  GLUCOSE 157*  BUN 12  CREATININE 1.01  CALCIUM 9.4   GFR Estimated Creatinine Clearance: 32.5 mL/min (by C-G formula based on Cr of 1.01). Liver Function Tests:  Recent Labs Lab 03/19/14 1940  AST 26  ALT 10  ALKPHOS 70  BILITOT 0.5  PROT 6.9  ALBUMIN 4.1   No results for input(s): LIPASE, AMYLASE in the last 168 hours. No results for input(s): AMMONIA in the last 168 hours. Coagulation profile No results for input(s): INR, PROTIME in the last 168 hours.  CBC:  Recent Labs Lab 03/19/14 1940  WBC 5.4  NEUTROABS 2.9  HGB 13.1  HCT 41.1  MCV 87.4  PLT 164   Cardiac Enzymes: No results for input(s): CKTOTAL, CKMB, CKMBINDEX, TROPONINI in the last 168 hours. BNP (last 3 results) No results for input(s): PROBNP in the last 8760 hours. CBG:  Recent Labs Lab 03/19/14 1941  GLUCAP 158*   D-Dimer: No results for input(s): DDIMER in the last  72 hours. Hgb A1c: No results for input(s): HGBA1C in the last 72 hours. Lipid Profile: No results for input(s): CHOL, HDL, LDLCALC, TRIG, CHOLHDL, LDLDIRECT in the last 72 hours. Thyroid function studies: No results for input(s): TSH, T4TOTAL, T3FREE, THYROIDAB in the last 72 hours.  Invalid input(s): FREET3 Anemia work up: No results for input(s): VITAMINB12, FOLATE, FERRITIN, TIBC, IRON, RETICCTPCT in the last 72 hours. Sepsis Labs:  Recent Labs Lab 03/19/14 1940  WBC 5.4   Microbiology No results found for this or any previous visit (from the past 240 hour(s)).   . clopidogrel  75 mg Oral Daily  . enoxaparin (LOVENOX) injection  40 mg Subcutaneous Daily  . levothyroxine  100 mcg Oral QAC breakfast  . pantoprazole  40 mg Oral Q0600  . sodium chloride  3 mL Intravenous Q12H  . sodium chloride  3 mL Intravenous Q12H   Continuous Infusions:    Assessment/Plan: TIA - escalated therapy to plavix 75 daily. MRI performed today, will follow. CUS ordered. Appreciate neurology help .Risk stratify as well.   HTN - continue to monitor . Was  labile overnight. Hydralazine prn  Dispo - home after w/u complete . PT/OT ordered     LOS: 1 day   Verdean Murin 03/20/2014, 7:05 AM  I , or a covering physician , can be reached for questions at [phone].

## 2014-03-20 NOTE — Progress Notes (Signed)
EEG Completed; Results Pending  

## 2014-03-20 NOTE — Progress Notes (Signed)
Paged Neurology on call regarding pts systolic BP in the 732K and headache unrelieved by Tylenol.  No new orders received.  Advised to page if systolic is greater than 025.  Will continue to monitor.

## 2014-03-20 NOTE — Progress Notes (Signed)
STROKE TEAM PROGRESS NOTE   HISTORY Danielle Bryan is an 81 y.o. female with a past medical history that is relevant for HTN, hypercholesterolemia, OA, admitted to [facility] due to inability to speak. Never had similar problem before. Patient was in her usual state of health until approximately 6 pm yesterday 03/19/2014 when her son came over for supper and noted that she was not able to express herself as she normally does. She said that she knew that she want it to say but couldn't get it out. That episode lasted for approximately 1.5 hours and resolved. She denies associated HA, vertigo, double vision, difficulty swallowing, focal weakness or numbness, or vision impairment. CT brain performed in the ED last night showed no acute abnormality. On plavix.  Dr. Aram Beecham was informed by patient's nurse that since arrival to the floor she has been having non fluent, hesitant language. Danielle Bryan said that " I don't know what is going on but I can not speak well anymore".  Patient was not administered TPA secondary to symptoms iitially resolved in the ED and at time of worsening was out of the window. She was admitted for further evaluation and treatment.   SUBJECTIVE (INTERVAL HISTORY) No family is at the bedside.  Overall she feels her condition is stable. She is anxious to go home.   OBJECTIVE Temp:  [97.9 F (36.6 C)-99.1 F (37.3 C)] 98.4 F (36.9 C) (01/25 1002) Pulse Rate:  [78-109] 93 (01/25 1002) Cardiac Rhythm:  [-]  Resp:  [10-18] 18 (01/25 1002) BP: (134-214)/(52-78) 157/64 mmHg (01/25 1002) SpO2:  [94 %-100 %] 99 % (01/25 1002) Weight:  [61.236 kg (135 lb)] 61.236 kg (135 lb) (01/24 1939)   Recent Labs Lab 03/19/14 1941  GLUCAP 158*    Recent Labs Lab 03/19/14 1940  NA 137  K 3.9  CL 100  CO2 24  GLUCOSE 157*  BUN 12  CREATININE 1.01  CALCIUM 9.4    Recent Labs Lab 03/19/14 1940  AST 26  ALT 10  ALKPHOS 70  BILITOT 0.5  PROT 6.9  ALBUMIN 4.1    Recent Labs Lab  03/19/14 1940  WBC 5.4  NEUTROABS 2.9  HGB 13.1  HCT 41.1  MCV 87.4  PLT 164   No results for input(s): CKTOTAL, CKMB, CKMBINDEX, TROPONINI in the last 168 hours. No results for input(s): LABPROT, INR in the last 72 hours.  Recent Labs  03/19/14 2045  COLORURINE YELLOW  LABSPEC 1.006  PHURINE 6.0  GLUCOSEU NEGATIVE  HGBUR NEGATIVE  BILIRUBINUR NEGATIVE  KETONESUR NEGATIVE  PROTEINUR NEGATIVE  UROBILINOGEN 0.2  NITRITE NEGATIVE  LEUKOCYTESUR SMALL*       Component Value Date/Time   CHOL 243* 02/12/2012 1125   TRIG 99.0 02/12/2012 1125   HDL 79.10 02/12/2012 1125   CHOLHDL 3 02/12/2012 1125   VLDL 19.8 02/12/2012 1125   No results found for: HGBA1C No results found for: LABOPIA, COCAINSCRNUR, LABBENZ, AMPHETMU, THCU, LABBARB  No results for input(s): ETH in the last 168 hours.  Dg Chest 2 View  03/19/2014   CLINICAL DATA:  Altered mental status. Hx HTN.  EXAM: CHEST  2 VIEW  COMPARISON:  09/26/2012  FINDINGS: Cardiac silhouette normal in size and configuration. No mediastinal or hilar masses or convincing adenopathy. Lungs are mildly hyperexpanded. There are chronic areas of interstitial thickening and parenchymal scarring. No lung consolidation or edema. No pleural effusion or pneumothorax.  Bony thorax is demineralized but grossly intact.  IMPRESSION: No acute cardiopulmonary disease.  Electronically Signed   By: Lajean Manes M.D.   On: 03/19/2014 20:32   Ct Head Wo Contrast  03/19/2014   CLINICAL DATA:  PT family states she is unable to complete her thoughts Patient said she knew what she wanted to say, but she couldn't say it.  EXAM: CT HEAD WITHOUT CONTRAST  TECHNIQUE: Contiguous axial images were obtained from the base of the skull through the vertex without intravenous contrast.  COMPARISON:  05/23/2010  FINDINGS: Ventricles normal in configuration. There is ventricular and sulcal enlargement reflecting age related atrophy. No hydrocephalus.  Patchy white matter  hypoattenuation is noted consistent with mild to moderate chronic microvascular ischemic change.  No evidence of a cortical infarct.  There are no extra-axial masses or abnormal fluid collections.  There is no intracranial hemorrhage.  Visualized sinuses and mastoid air cells are clear.  IMPRESSION: 1. No acute intracranial abnormalities. 2. Age related atrophy. Mild to moderate chronic microvascular ischemic change.   Electronically Signed   By: Lajean Manes M.D.   On: 03/19/2014 20:03   Mr Brain Wo Contrast  03/20/2014   CLINICAL DATA:  TIA. Mental status changes with speech disturbance beginning yesterday.  EXAM: MRI HEAD WITHOUT CONTRAST  TECHNIQUE: Multiplanar, multiecho pulse sequences of the brain and surrounding structures were obtained without intravenous contrast.  COMPARISON:  Head CT 03/19/2014  FINDINGS: Diffusion imaging does not show any acute or subacute infarction. There are extensive chronic small vessel ischemic changes throughout the pons. There are a few old small vessel cerebellar infarctions. There are old chronic small vessel infarctions affecting the thalami, basal ganglia and throughout the cerebral hemispheric white matter. No cortical or large vessel territory infarction. No mass lesion, hemorrhage, hydrocephalus or extra-axial collection. No pituitary mass. No inflammatory sinus disease. No skull or skullbase lesion. Major vessels at the base of the brain show flow.  IMPRESSION: No acute or subacute infarction. Age related chronic small vessel ischemic changes throughout the brain as outlined above.   Electronically Signed   By: Nelson Chimes M.D.   On: 03/20/2014 08:06     PHYSICAL EXAM Pleasant elderly caucasian lady not in distress.Awake alert. Afebrile. Head is nontraumatic. Neck is supple without bruit. Hearing is normal. Cardiac exam no murmur or gallop. Lungs clear to auscultation. Abdomen soft nontender. Distal pulses well felt  Neurological Exam ;  Awake  Alert  oriented x 3. Normal speech and language except occasional word hesitancy but no paraphasic errors.eye movements full without nystagmus.fundi were not visualized. Vision acuity and fields appear normal. Hearing is normal. Palatal movements are normal. Face symmetric. Tongue midline. Normal strength, tone, reflexes and coordination. Normal sensation. Gait deferred. ASSESSMENT/PLAN Danielle Bryan is a 81 y.o. female with history of HTN, hypercholesterolemia, OA presenting with inability to speak. She did not receive IV t-PA as symptoms initially resolved. Once recurred, she was beyond the window.   TIA:  L brain  Resultant  Slurred speech and expressive aphasia  MRI  No acute stroke  MRA  Notordered. Will check TCD instead to look at vasculature.  TCD pending (do not have to wait on results, Dr. Leonie Man will address at follow up)  Carotid Doppler  pending   2D Echo  Not ordered. Will check.  EEG to rule out seizures  LDL pending. Intolerant to statins in the past (myalgias)  HgbA1c pending  Lovenox 40 mg sq daily for VTE prophylaxis  Diet Heart thin liquids  aspirin 81 mg orally every day prior to  admission, changed to clopidogrel 75 mg orally every day  Patient counseled to be compliant with her antithrombotic medications  Ongoing aggressive stroke risk factor management  Ok for discharge once workup completed  Therapy recommendations:  pending   Once workup completed, ok for discharge from neuro standpoint. Can follow up with Dr. Leonie Man in 2 months. (ordered)  Disposition:  Anticipate return home  Accelerated Hypertension  Admitting BP 204/78  Stable today  Other Stroke Risk Factors  Advanced age  Hospital day # [place] for Pager information 03/20/2014 10:21 AM  I have personally examined this patient, reviewed notes, independently viewed imaging studies, participated in medical decision making and plan of care. I  have made any additions or clarifications directly to the above note. Agree with note above. She has most likely had a left hemispheric TIA though simple partial seizures and computer-aided migraine are less likely. Continue ongoing stroke evaluation but also check EEG. I had a long discussion with the patient as well as her son regarding her ongoing clinical evaluation, differential diagnosis, planned for evaluation, treatment and answered questions. Antony Contras, MD Medical Director [facility] Pager: [phone] 03/20/2014 3:36 PM    To contact Stroke Continuity provider, please refer to [email or web address] After hours, contact General Neurology

## 2014-03-21 DIAGNOSIS — I6789 Other cerebrovascular disease: Secondary | ICD-10-CM

## 2014-03-21 LAB — URINALYSIS, ROUTINE W REFLEX MICROSCOPIC
Bilirubin Urine: NEGATIVE
GLUCOSE, UA: NEGATIVE mg/dL
HGB URINE DIPSTICK: NEGATIVE
Ketones, ur: NEGATIVE mg/dL
Nitrite: NEGATIVE
Protein, ur: 30 mg/dL — AB
Specific Gravity, Urine: 1.022 (ref 1.005–1.030)
Urobilinogen, UA: 0.2 mg/dL (ref 0.0–1.0)
pH: 5.5 (ref 5.0–8.0)

## 2014-03-21 LAB — HEMOGLOBIN A1C
HEMOGLOBIN A1C: 5.4 % (ref <5.7)
Mean Plasma Glucose: 108 mg/dL (ref <117)

## 2014-03-21 LAB — URINE MICROSCOPIC-ADD ON

## 2014-03-21 LAB — LIPID PANEL
CHOLESTEROL: 226 mg/dL — AB (ref 0–200)
HDL: 79 mg/dL (ref >39)
LDL Cholesterol: 131 mg/dL — ABNORMAL HIGH (ref 0–99)
Total CHOL/HDL Ratio: 2.9 RATIO
Triglycerides: 81 mg/dL (ref <150)
VLDL: 16 mg/dL (ref 0–40)

## 2014-03-21 MED ORDER — CIPROFLOXACIN HCL 250 MG PO TABS: 250.0000 mg | ORAL_TABLET | Freq: Two times a day (BID) | ORAL | Status: DC

## 2014-03-21 MED ORDER — LEVETIRACETAM 250 MG PO TABS: 250.0000 mg | ORAL_TABLET | Freq: Two times a day (BID) | ORAL | Status: DC

## 2014-03-21 MED ORDER — LEVETIRACETAM 250 MG PO TABS
250.0000 mg | ORAL_TABLET | Freq: Two times a day (BID) | ORAL | Status: DC
  Administered 2014-03-21: 250 mg via ORAL
  Filled 2014-03-21: qty 1

## 2014-03-21 MED ORDER — CLOPIDOGREL BISULFATE 75 MG PO TABS: 75.0000 mg | ORAL_TABLET | Freq: Every day | ORAL | Status: AC

## 2014-03-21 NOTE — Progress Notes (Signed)
UR complete.  Dai Apel RN, MSN 

## 2014-03-21 NOTE — Progress Notes (Signed)
Physical Therapy Treatment Patient Details Name: Danielle Bryan MRN: [id number] DOB: April 01, 1923 Today's Date: 03/21/2014    History of Present Illness Patient is an 81 yo female admitted 03/19/14 with difficulty with her speech.  MRI brain negative for acute event.  PMH:  HTN, hypothyroidism, OA, HLD, depression    PT Comments    Patient slightly more fatigued and unsteady today. Son present and confirmed pt will stay with him on discharge (also a one level home with 3 steps to enter). Both agreeable to have HHPT on discharge.   Follow Up Recommendations  Home health PT;Supervision/Assistance - 24 hour     Equipment Recommendations  None recommended by PT    Recommendations for Other Services       Precautions / Restrictions Precautions Precautions: Fall Restrictions Weight Bearing Restrictions: No    Mobility  Bed Mobility                  Transfers Overall transfer level: Modified independent Equipment used: None             General transfer comment: Increased time (due to OA per pt/son)  Ambulation/Gait Ambulation/Gait assistance: Min assist Ambulation Distance (Feet): 300 Feet (1 standing rest after 150 ft) Assistive device: None;Straight cane Gait Pattern/deviations: Step-through pattern;Decreased stride length;Drifts right/left;Wide base of support Gait velocity: Decreased   General Gait Details: Pt remains unsteady with primarily drifting to her Rt (even bumped into railing on her right x 1); attempted use of cane, and gait was slightly improved, but remained unsteady. Pt very fatigued at end of session   Stairs            Wheelchair Mobility    Modified Rankin (Stroke Patients Only) Modified Rankin (Stroke Patients Only) Pre-Morbid Rankin Score: No significant disability Modified Rankin: Moderately severe disability     Balance                                    Cognition Arousal/Alertness: Awake/alert Behavior  During Therapy: WFL for tasks assessed/performed Overall Cognitive Status: Within Functional Limits for tasks assessed                      Exercises      General Comments General comments (skin integrity, edema, etc.): son present and confirmed pt will stay with him for a few days. Pt/son agreed to use of RW until she improves.      Pertinent Vitals/Pain Pain Assessment: No/denies pain    Home Living                      Prior Function            PT Goals (current goals can now be found in the care plan section) Acute Rehab PT Goals Patient Stated Goal: to "get straightened out" Progress towards PT goals: Progressing toward goals    Frequency  Min 4X/week    PT Plan Current plan remains appropriate    Co-evaluation             End of Session Equipment Utilized During Treatment: Gait belt Activity Tolerance: Patient limited by fatigue Patient left: with call bell/phone within reach;in chair;with chair alarm set;with family/visitor present;with nursing/sitter in room     Time: 1300-1318 PT Time Calculation (min) (ACUTE ONLY): 18 min  Charges:  $Gait Training: 8-22 mins  G Codes:      Danielle Bryan 03/21/2014, 1:29 PM Pager [phone]

## 2014-03-21 NOTE — Progress Notes (Addendum)
Physician Daily Progress Note  Subjective: This morning states she feels "bad" but cannot define this state of feeling further . Did have temp overnight  Objective: Vital signs in last 24 hours:   Filed Vitals:   03/21/14 0148 03/21/14 0210 03/21/14 0542 03/21/14 0557  BP: 194/71 145/72 136/55   Pulse: 89  102   Temp: 98.3 F (36.8 C)  101.5 F (38.6 C) 100.2 F (37.9 C)  TempSrc: Oral  Axillary Oral  Resp: 18  18   Height:      Weight:      SpO2: 100%  97%    Weight change:  Last BM Date: 03/19/14  CBG (last 3)   Recent Labs  03/19/14 1941  GLUCAP 158*    Intake/Output from previous day:  Intake/Output Summary (Last 24 hours) at 03/21/14 0817 Last data filed at 03/20/14 2226  Gross per 24 hour  Intake    783 ml  Output      0 ml  Net    783 ml      Physical Exam General appearance: WF in NAD  HEENT:  MMM , anicteric  Resp:  CTAB , no wr   Cardio:  RRR ,no mrg  GI: soft, non-tender; bowel sounds normal; no masses,  no organomegaly Extremities: no clubbing, cyanosis or edema   Labs: Basic Metabolic Panel:  Recent Labs Lab 03/19/14 1940  NA 137  K 3.9  CL 100  CO2 24  GLUCOSE 157*  BUN 12  CREATININE 1.01  CALCIUM 9.4   GFR Estimated Creatinine Clearance: 32.5 mL/min (by C-G formula based on Cr of 1.01). Liver Function Tests:  Recent Labs Lab 03/19/14 1940  AST 26  ALT 10  ALKPHOS 70  BILITOT 0.5  PROT 6.9  ALBUMIN 4.1   No results for input(s): LIPASE, AMYLASE in the last 168 hours. No results for input(s): AMMONIA in the last 168 hours. Coagulation profile No results for input(s): INR, PROTIME in the last 168 hours.  CBC:  Recent Labs Lab 03/19/14 1940  WBC 5.4  NEUTROABS 2.9  HGB 13.1  HCT 41.1  MCV 87.4  PLT 164   Cardiac Enzymes: No results for input(s): CKTOTAL, CKMB, CKMBINDEX, TROPONINI in the last 168 hours. BNP (last 3 results) No results for input(s): PROBNP in the last 8760 hours. CBG:  Recent Labs Lab  03/19/14 1941  GLUCAP 158*   D-Dimer: No results for input(s): DDIMER in the last 72 hours. Hgb A1c: No results for input(s): HGBA1C in the last 72 hours. Lipid Profile:  Recent Labs  03/21/14 0551  CHOL 226*  HDL 79  LDLCALC 131*  TRIG 81  CHOLHDL 2.9   Thyroid function studies: No results for input(s): TSH, T4TOTAL, T3FREE, THYROIDAB in the last 72 hours.  Invalid input(s): FREET3 Anemia work up: No results for input(s): VITAMINB12, FOLATE, FERRITIN, TIBC, IRON, RETICCTPCT in the last 72 hours. Sepsis Labs:  Recent Labs Lab 03/19/14 1940  WBC 5.4   Microbiology No results found for this or any previous visit (from the past 240 hour(s)).   . clopidogrel  75 mg Oral Daily  . enoxaparin (LOVENOX) injection  40 mg Subcutaneous Daily  . levothyroxine  100 mcg Oral QAC breakfast  . pantoprazole  40 mg Oral Q0600  . sodium chloride  3 mL Intravenous Q12H  . sodium chloride  3 mL Intravenous Q12H   Continuous Infusions:    Assessment/Plan: TIA -   plavix 75 daily. MRI showed NAICA. CUS/TC/TTE ordered.  Appreciate neurology help and dispo per their recs. EEG pending as well   Fever - checking u/a. Otherwise seems isolated event  HTN - continue to monitor . Hydralazine prn  Dispo - home after w/u complete. She will be staying w/ her son for a few days after discharge . PT/OT ordered     LOS: 2 days   Ryne Mctigue 03/21/2014, 8:17 AM  I , or a covering physician , can be reached for questions at [phone].

## 2014-03-21 NOTE — Evaluation (Signed)
Speech Language Pathology Evaluation Patient Details Name: Danielle Bryan MRN: [id number] DOB: 31-Dec-1923 Today's Date: 03/21/2014 Time: 1400-1420 SLP Time Calculation (min) (ACUTE ONLY): 20 min  Problem List:  Patient Active Problem List   Diagnosis Date Noted  . TIA (transient ischemic attack) 03/19/2014  . Benign hypertensive heart disease without heart failure 08/20/2010  . Hypothyroidism   . Osteoarthritis   . Hypercholesterolemia   . Reactive depression (situational)    Past Medical History:  Past Medical History  Diagnosis Date  . Hypertension   . Hypothyroidism   . Osteoarthritis   . Hypercholesterolemia   . Reactive depression (situational)    Past Surgical History: History reviewed. No pertinent past surgical history. HPI:  81 y.o. female with a past medical history that is relevant for HTN, hypercholesterolemia, OA, admitted to [facility] due to inability to speak. Never had similar problem before. Patient was in her usual state of health until approximately 6 pm yesterday 03/19/2014 when her son came over for supper and noted that she was not able to express herself as she normally does. She said that she knew that she want it to say but couldn't get it out. That episode lasted for approximately 1.5 hours and resolved. She denies associated HA, vertigo, double vision, difficulty swallowing, focal weakness or numbness, or vision impairment. CT brain performed in the ED last night showed no acute abnormality   Assessment / Plan / Recommendation Clinical Impression  Pt with resolution of expressive language deficits per assessment using subtests of [place] Diagnostic Aphasia Exam.  Output is fluent with no paraphasias; mild higher-level deficits in word retrieval during list generation task.  Able to follow three step commands, answer complex yes/no questions related to paragraph information with minimal deficit.  Articulation is WNL.  Repetition intact.  No SLP necessary.  D/W pt, who  is in agreement.     SLP Assessment  Patient does not need any further Speech Lanaguage Pathology Services    Follow Up Recommendations  None       Pertinent Vitals/Pain Pain Assessment: No/denies pain   SLP Goals     SLP Evaluation Prior Functioning  Cognitive/Linguistic Baseline: Within functional limits Type of Home: House Available Help at Discharge: Family;Available PRN/intermittently   Cognition  Overall Cognitive Status: Within Functional Limits for tasks assessed    Comprehension  Auditory Comprehension Overall Auditory Comprehension: Appears within functional limits for tasks assessed (mild deficits complex ideational material) Yes/No Questions: Within Functional Limits Commands: Within Functional Limits Conversation: Simple Visual Recognition/Discrimination Discrimination: Within Function Limits Reading Comprehension Reading Status: Within funtional limits    Expression Expression Primary Mode of Expression: Verbal Verbal Expression Overall Verbal Expression: Appears within functional limits for tasks assessed Level of Generative/Spontaneous Verbalization: Conversation Repetition: No impairment Naming: No impairment (deficits generative naming tasks) Pragmatics: No impairment Written Expression Dominant Hand: Right   Oral / Motor Oral Motor/Sensory Function Overall Oral Motor/Sensory Function: Appears within functional limits for tasks assessed Motor Speech Overall Motor Speech: Appears within functional limits for tasks assessed   GO    Brad Lieurance L. Tivis Ringer, [place] CCC/SLP Pager [phone]  Juan Quam Laurice 03/21/2014, 2:27 PM

## 2014-03-21 NOTE — Progress Notes (Signed)
  Echocardiogram 2D Echocardiogram has been performed.  Donata Clay 03/21/2014, 11:36 AM

## 2014-03-21 NOTE — Progress Notes (Signed)
Pt d/c to home by car with family. Assessment stable. Family verbalizes understanding of d/c instructions.

## 2014-03-21 NOTE — Progress Notes (Signed)
STROKE TEAM PROGRESS NOTE   HISTORY Danielle RODRIGES is an 81 y.o. female with a past medical history that is relevant for HTN, hypercholesterolemia, OA, admitted to [facility] due to inability to speak. Never had similar problem before. Patient was in her usual state of health until approximately 6 pm yesterday 03/19/2014 when her son came over for supper and noted that she was not able to express herself as she normally does. She said that she knew that she want it to say but couldn't get it out. That episode lasted for approximately 1.5 hours and resolved. She denies associated HA, vertigo, double vision, difficulty swallowing, focal weakness or numbness, or vision impairment. CT brain performed in the ED last night showed no acute abnormality. On plavix.  Dr. Aram Beecham was informed by patient's nurse that since arrival to the floor she has been having non fluent, hesitant language. Danielle Bryan said that " I don't know what is going on but I can not speak well anymore".  Patient was not administered TPA secondary to symptoms iitially resolved in the ED and at time of worsening was out of the window. She was admitted for further evaluation and treatment.   SUBJECTIVE (INTERVAL HISTORY) No family is at the bedside.  Overall she feels her condition is stable. She is anxious to go home.No recurrent speech episodes   OBJECTIVE Temp:  [97.5 F (36.4 C)-101.5 F (38.6 C)] 98.2 F (36.8 C) (01/26 1030) Pulse Rate:  [80-102] 90 (01/26 1030) Cardiac Rhythm:  [-] Normal sinus rhythm (01/26 0800) Resp:  [17-18] 17 (01/26 1030) BP: (127-194)/(55-72) 127/60 mmHg (01/26 1030) SpO2:  [97 %-100 %] 99 % (01/26 1030)   Recent Labs Lab 03/19/14 1941  GLUCAP 158*    Recent Labs Lab 03/19/14 1940  NA 137  K 3.9  CL 100  CO2 24  GLUCOSE 157*  BUN 12  CREATININE 1.01  CALCIUM 9.4    Recent Labs Lab 03/19/14 1940  AST 26  ALT 10  ALKPHOS 70  BILITOT 0.5  PROT 6.9  ALBUMIN 4.1    Recent Labs Lab  03/19/14 1940  WBC 5.4  NEUTROABS 2.9  HGB 13.1  HCT 41.1  MCV 87.4  PLT 164   No results for input(s): CKTOTAL, CKMB, CKMBINDEX, TROPONINI in the last 168 hours. No results for input(s): LABPROT, INR in the last 72 hours.  Recent Labs  03/19/14 2045  COLORURINE YELLOW  LABSPEC 1.006  PHURINE 6.0  GLUCOSEU NEGATIVE  HGBUR NEGATIVE  BILIRUBINUR NEGATIVE  KETONESUR NEGATIVE  PROTEINUR NEGATIVE  UROBILINOGEN 0.2  NITRITE NEGATIVE  LEUKOCYTESUR SMALL*       Component Value Date/Time   CHOL 226* 03/21/2014 0551   TRIG 81 03/21/2014 0551   HDL 79 03/21/2014 0551   CHOLHDL 2.9 03/21/2014 0551   VLDL 16 03/21/2014 0551   LDLCALC 131* 03/21/2014 0551   No results found for: HGBA1C No results found for: LABOPIA, COCAINSCRNUR, LABBENZ, AMPHETMU, THCU, LABBARB  No results for input(s): ETH in the last 168 hours.  Dg Chest 2 View  03/19/2014   CLINICAL DATA:  Altered mental status. Hx HTN.  EXAM: CHEST  2 VIEW  COMPARISON:  09/26/2012  FINDINGS: Cardiac silhouette normal in size and configuration. No mediastinal or hilar masses or convincing adenopathy. Lungs are mildly hyperexpanded. There are chronic areas of interstitial thickening and parenchymal scarring. No lung consolidation or edema. No pleural effusion or pneumothorax.  Bony thorax is demineralized but grossly intact.  IMPRESSION: No acute cardiopulmonary  disease.   Electronically Signed   By: Lajean Manes M.D.   On: 03/19/2014 20:32   Ct Head Wo Contrast  03/19/2014   CLINICAL DATA:  PT family states she is unable to complete her thoughts Patient said she knew what she wanted to say, but she couldn't say it.  EXAM: CT HEAD WITHOUT CONTRAST  TECHNIQUE: Contiguous axial images were obtained from the base of the skull through the vertex without intravenous contrast.  COMPARISON:  05/23/2010  FINDINGS: Ventricles normal in configuration. There is ventricular and sulcal enlargement reflecting age related atrophy. No  hydrocephalus.  Patchy white matter hypoattenuation is noted consistent with mild to moderate chronic microvascular ischemic change.  No evidence of a cortical infarct.  There are no extra-axial masses or abnormal fluid collections.  There is no intracranial hemorrhage.  Visualized sinuses and mastoid air cells are clear.  IMPRESSION: 1. No acute intracranial abnormalities. 2. Age related atrophy. Mild to moderate chronic microvascular ischemic change.   Electronically Signed   By: Lajean Manes M.D.   On: 03/19/2014 20:03   Mr Brain Wo Contrast  03/20/2014   CLINICAL DATA:  TIA. Mental status changes with speech disturbance beginning yesterday.  EXAM: MRI HEAD WITHOUT CONTRAST  TECHNIQUE: Multiplanar, multiecho pulse sequences of the brain and surrounding structures were obtained without intravenous contrast.  COMPARISON:  Head CT 03/19/2014  FINDINGS: Diffusion imaging does not show any acute or subacute infarction. There are extensive chronic small vessel ischemic changes throughout the pons. There are a few old small vessel cerebellar infarctions. There are old chronic small vessel infarctions affecting the thalami, basal ganglia and throughout the cerebral hemispheric white matter. No cortical or large vessel territory infarction. No mass lesion, hemorrhage, hydrocephalus or extra-axial collection. No pituitary mass. No inflammatory sinus disease. No skull or skullbase lesion. Major vessels at the base of the brain show flow.  IMPRESSION: No acute or subacute infarction. Age related chronic small vessel ischemic changes throughout the brain as outlined above.   Electronically Signed   By: Nelson Chimes M.D.   On: 03/20/2014 08:06     PHYSICAL EXAM Pleasant elderly caucasian lady not in distress.Awake alert. Afebrile. Head is nontraumatic. Neck is supple without bruit. Hearing is normal. Cardiac exam no murmur or gallop. Lungs clear to auscultation. Abdomen soft nontender. Distal pulses well felt   Neurological Exam ;  Awake  Alert oriented x 3. Normal speech and language except occasional word hesitancy but no paraphasic errors.eye movements full without nystagmus.fundi were not visualized. Vision acuity and fields appear normal. Hearing is normal. Palatal movements are normal. Face symmetric. Tongue midline. Normal strength, tone, reflexes and coordination. Normal sensation. Gait deferred. ASSESSMENT/PLAN Danielle Bryan is a 81 y.o. female with history of HTN, hypercholesterolemia, OA presenting with inability to speak. She did not receive IV t-PA as symptoms initially resolved. Once recurred, she was beyond the window.   TIA:  L brain  Resultant  Slurred speech and expressive aphasia  MRI  No acute stroke  MRA  Notordered. Will check TCD instead to look at vasculature.  TCD pending (do not have to wait on results, Dr. Leonie Man will address at follow up)  Carotid Doppler  pending   2D Echo  Not ordered. Will check. EEG abnormal with mild to moderate continuous slowing of electrical activity involving the left cerebral hemisphere, most prominent involving the left temporal region. No clear epileptiform activity was recorded. Please be aware that the lack of frank epileptiform activity  in this recording does not rule out an epileptic disorder.    LDL pending. Intolerant to statins in the past (myalgias)  HgbA1c pending  Lovenox 40 mg sq daily for VTE prophylaxis  Diet Heart thin liquids  aspirin 81 mg orally every day prior to admission, changed to clopidogrel 75 mg orally every day  Patient counseled to be compliant with her antithrombotic medications  Ongoing aggressive stroke risk factor management  Ok for discharge once workup completed  Therapy recommendations:  pending   Once workup completed, ok for discharge from neuro standpoint. Can follow up with Dr. Leonie Man in 2 months. (ordered)  Disposition:  Anticipate return home  Accelerated Hypertension  Admitting  BP 204/78  Stable today  Other Stroke Risk Factors  Advanced age  Hospital day # [place] for Pager information 03/21/2014 12:44 PM  I have personally examined this patient, reviewed notes, independently viewed imaging studies, participated in medical decision making and plan of care. I have made any additions or clarifications directly to the above note. Agree with note above. It is unclear wether she  had a left hemispheric TIA or simple partial seizures  But abnormal EEG is concerning   for: Keppra 250 mg twice daily for seizure prevention for at least 6 months. Continue antiplatelet therapy as well as aggressive stroke risk factor modification. Follow-up in the office in 1 month or call earlier if necessary  Antony Contras, [place] Pager: [phone] 03/21/2014 12:44 PM    To contact Stroke Continuity provider, please refer to [email or web address] After hours, contact General Neurology

## 2014-03-21 NOTE — Care Management Note (Signed)
    Page 1 of 1   03/21/2014     3:02:59 PM CARE MANAGEMENT NOTE 03/21/2014  Patient:  Danielle Bryan, Danielle Bryan   Account Number:  [id number]  Date Initiated:  03/21/2014  Documentation initiated by:  Lorne Skeens  Subjective/Objective Assessment:   Patient was admitted for TIA work-up.     Action/Plan:   Will follow for discharge needs   Anticipated DC Date:  03/21/2014   Anticipated DC Plan:  [place]  CM consult      Choice offered to / List presented to:  C-1 Patient        [place] arranged  [facility]      [place].   Status of service:  Completed, signed off Medicare Important Message given?  NA - LOS <3 / Initial given by admissions (If response is "NO", the following Medicare IM given date fields will be blank) Date Medicare IM given:   Medicare IM given by:   Date Additional Medicare IM given:   Additional Medicare IM given by:    Discharge Disposition:  [place]  Per UR Regulation:  Reviewed for med. necessity/level of care/duration of stay  If discussed at [place] of Stay Meetings, dates discussed:    Comments:  03/21/14 [place], MSN, CM- Met with patient to discuss home health needs. Patient is agreeable to home health and has chosen [facility]. Miranda with [facility] was notified and has accepted the referral for discharge home today.

## 2014-03-21 NOTE — Discharge Summary (Addendum)
Physician Discharge Summary    Danielle Bryan  MR#: [id number]  DOB:02-19-1924  Date of Admission: 03/19/2014 Date of Discharge: 03/21/2014  Attending Physician:Amadu Schlageter, Nicki Reaper  Patient's PCP: [facility] Consults:Neurology  Discharge Diagnoses: Active Problems:   TIA (transient ischemic attack)   Discharge Medications:   Medication List    STOP taking these medications        aspirin 81 MG tablet      TAKE these medications        CENTRUM PO  Take 1 tablet by mouth daily.     cetaphil cream  Apply 1 application topically 2 (two) times daily as needed (dryness).     ciprofloxacin 250 MG tablet  Commonly known as:  CIPRO  Take 1 tablet (250 mg total) by mouth 2 (two) times daily.     clobetasol cream 0.05 %  Commonly known as:  TEMOVATE  Apply 1 application topically 2 (two) times daily.     clopidogrel 75 MG tablet  Commonly known as:  PLAVIX  Take 1 tablet (75 mg total) by mouth daily.     FISH OIL PO  Take 1 tablet by mouth 2 (two) times daily.     levETIRAcetam 250 MG tablet  Commonly known as:  KEPPRA  Take 1 tablet (250 mg total) by mouth 2 (two) times daily.     levothyroxine 100 MCG tablet  Commonly known as:  SYNTHROID, LEVOTHROID  TAKE 1 TABLET (100 MCG TOTAL) BY MOUTH DAILY. TAKING 6 DAYS A WEEK. DON'T TAKE ON SUNDAYS     pantoprazole 40 MG tablet  Commonly known as:  PROTONIX  Take 40 mg by mouth daily.        Hospital Procedures: Dg Chest 2 View  03/19/2014   CLINICAL DATA:  Altered mental status. Hx HTN.  EXAM: CHEST  2 VIEW  COMPARISON:  09/26/2012  FINDINGS: Cardiac silhouette normal in size and configuration. No mediastinal or hilar masses or convincing adenopathy. Lungs are mildly hyperexpanded. There are chronic areas of interstitial thickening and parenchymal scarring. No lung consolidation or edema. No pleural effusion or pneumothorax.  Bony thorax is demineralized but grossly intact.  IMPRESSION: No acute cardiopulmonary disease.    Electronically Signed   By: Lajean Manes M.D.   On: 03/19/2014 20:32   Ct Head Wo Contrast  03/19/2014   CLINICAL DATA:  PT family states she is unable to complete her thoughts Patient said she knew what she wanted to say, but she couldn't say it.  EXAM: CT HEAD WITHOUT CONTRAST  TECHNIQUE: Contiguous axial images were obtained from the base of the skull through the vertex without intravenous contrast.  COMPARISON:  05/23/2010  FINDINGS: Ventricles normal in configuration. There is ventricular and sulcal enlargement reflecting age related atrophy. No hydrocephalus.  Patchy white matter hypoattenuation is noted consistent with mild to moderate chronic microvascular ischemic change.  No evidence of a cortical infarct.  There are no extra-axial masses or abnormal fluid collections.  There is no intracranial hemorrhage.  Visualized sinuses and mastoid air cells are clear.  IMPRESSION: 1. No acute intracranial abnormalities. 2. Age related atrophy. Mild to moderate chronic microvascular ischemic change.   Electronically Signed   By: Lajean Manes M.D.   On: 03/19/2014 20:03   Mr Brain Wo Contrast  03/20/2014   CLINICAL DATA:  TIA. Mental status changes with speech disturbance beginning yesterday.  EXAM: MRI HEAD WITHOUT CONTRAST  TECHNIQUE: Multiplanar, multiecho pulse sequences of the brain and surrounding structures were obtained without  intravenous contrast.  COMPARISON:  Head CT 03/19/2014  FINDINGS: Diffusion imaging does not show any acute or subacute infarction. There are extensive chronic small vessel ischemic changes throughout the pons. There are a few old small vessel cerebellar infarctions. There are old chronic small vessel infarctions affecting the thalami, basal ganglia and throughout the cerebral hemispheric white matter. No cortical or large vessel territory infarction. No mass lesion, hemorrhage, hydrocephalus or extra-axial collection. No pituitary mass. No inflammatory sinus disease. No skull  or skullbase lesion. Major vessels at the base of the brain show flow.  IMPRESSION: No acute or subacute infarction. Age related chronic small vessel ischemic changes throughout the brain as outlined above.   Electronically Signed   By: Nelson Chimes M.D.   On: 03/20/2014 08:06    History of Present Illness: Danielle Bryan is a pleasant 81 yo female who was in her usual state of health until about 6 p.m.. Her son came over for supper and noted That she could not speak intelligibly. She states she knew what she wanted to say but she could not get her words out. There were no other stroke symptoms. She did not have facial droop or focal weakness. No visual deficits. EMS was called and within one hour Her speech normalized. She will admitted for possible TIA.   Hospital Course: TIA vs seizure-like activity -  MRI showed no acute stroke. CT head was unremarkable. Pt's anticoagulation was escalated from ASA 81 to Plavix 75mg  daily. She was evaluated by the stroke team. They felt this was left hemispheric TIA or simple partial seizures and her EEG was concerning. She has been placed on keppra 250mg  bid and will f/u w/ neurology in 1-2 months, and this f/u will be arranged by neurology. In addition, she underwent CUS that showed no stenosis. She has TTE and TC dopplars that are pending and will be followed as outpatient.  She is not on statin therapy b/c of hx of myalgias, so resumption of therapy will also be discussed as outpatient. Her LDL in the hospital was 130, with a goal LDL of 70 in stroke patient.   HTN - her BP ranged from SBP of 120-190 while in house . I cannot escalate her therapy b/c of risk of overmedicated leading to falls, syncope or brain hypoperfusion. Monitor as outpatient.   HLD - LDL is 130 in hospital . Discuss livalo as outpatient , as she may tolerate this medication given her known hx of myalgia  UTI - patient u/a prelim read showed many bacteria. She will be discharged w/ rx  for 7  day course of cipro . F/u culture as outpatient  Day of Discharge Exam BP 127/60 mmHg  Pulse 90  Temp(Src) 98.2 F (36.8 C) (Oral)  Resp 17  Ht 5\' 2"  (1.575 m)  Wt 135 lb (61.236 kg)  BMI 24.69 kg/m2  SpO2 99%  Physical Exam: General appearance: WF in NAD  HEENT: MMM , anicteric  Resp: CTAB , no wr  Cardio: RRR ,no mrg  GI: soft, non-tender; bowel sounds normal; no masses, no organomegaly Extremities: no clubbing, cyanosis or edema  Discharge Labs: Results for AZLIN, ZILBERMAN (MRN [id number]) as of 03/21/2014 14:39  Ref. Range 09/28/2012 05:40 03/19/2014 19:28 03/19/2014 19:40 03/19/2014 19:41 03/19/2014 19:53 03/19/2014 19:58 03/19/2014 20:28 03/19/2014 20:45 03/20/2014 07:55 03/20/2014 09:22 03/20/2014 11:23 03/20/2014 12:08 03/21/2014 05:51 03/21/2014 11:06 03/21/2014 12:09  Glucose-Capillary Latest Range: 70-99 mg/dL    158 (H)  Sodium Latest Range: 135-145 mmol/L   137              Potassium Latest Range: 3.5-5.1 mmol/L   3.9              Chloride Latest Range: 96-112 mmol/L   100              CO2 Latest Range: 19-32 mmol/L   24              BUN Latest Range: 6-23 mg/dL   12              Creatinine Latest Range: 0.50-1.10 mg/dL   1.01              Calcium Latest Range: 8.4-10.5 mg/dL   9.4              GFR calc non Af Amer Latest Range: >90 mL/min   48 (L)              GFR calc Af Amer Latest Range: >90 mL/min   55 (L)              Glucose Latest Range: 70-99 mg/dL   157 (H)              Anion gap Latest Range: 5-15    13              Alkaline Phosphatase Latest Range: 39-117 U/L   70              Albumin Latest Range: 3.5-5.2 g/dL   4.1              AST Latest Range: 0-37 U/L   26              ALT Latest Range: 0-35 U/L   10              Total Protein Latest Range: 6.0-8.3 g/dL   6.9              Total Bilirubin Latest Range: 0.3-1.2 mg/dL   0.5              Troponin i, poc Latest Range: 0.00-0.08 ng/mL      0.00           Cholesterol Latest Range: 0-200 mg/dL              226 (H)    Triglycerides Latest Range: <150 mg/dL             81    HDL Latest Range: >39 mg/dL             79    LDL (calc) Latest Range: 0-99 mg/dL             131 (H)    VLDL Latest Range: 0-40 mg/dL             16    Total CHOL/HDL Ratio Latest Units: RATIO             2.9    WBC Latest Range: 4.0-10.5 K/uL   5.4              RBC Latest Range: 3.87-5.11 MIL/uL   4.70              Hemoglobin Latest Range: 12.0-15.0 g/dL   13.1              HCT Latest Range: 36.0-46.0 %   41.1  MCV Latest Range: 78.0-100.0 fL   87.4              MCH Latest Range: 26.0-34.0 pg   27.9              MCHC Latest Range: 30.0-36.0 g/dL   31.9              RDW Latest Range: 11.5-15.5 %   13.5              Platelets Latest Range: 150-400 K/uL   164              Neutrophils Relative % Latest Range: 43-77 %   53              Lymphocytes Relative Latest Range: 12-46 %   35              Monocytes Relative Latest Range: 3-12 %   10              Eosinophils Relative Latest Range: 0-5 %   2              Basophils Relative Latest Range: 0-1 %   0              NEUT# Latest Range: 1.7-7.7 K/uL   2.9              Lymphocytes Absolute Latest Range: 0.7-4.0 K/uL   1.9              Monocytes Absolute Latest Range: 0.1-1.0 K/uL   0.5              Eosinophils Absolute Latest Range: 0.0-0.7 K/uL   0.1              Basophils Absolute Latest Range: 0.0-0.1 K/uL   0.0              Color, Urine Latest Range: YELLOW         YELLOW       YELLOW  APPearance Latest Range: CLEAR         CLEAR       CLOUDY (A)  Specific Gravity, Urine Latest Range: 1.005-1.030         1.006       1.022  pH Latest Range: 5.0-8.0         6.0       5.5  Glucose Latest Range: NEGATIVE mg/dL        NEGATIVE       NEGATIVE  Bilirubin Urine Latest Range: NEGATIVE         NEGATIVE       NEGATIVE  Ketones, ur Latest Range: NEGATIVE mg/dL        NEGATIVE       NEGATIVE  Protein Latest Range: NEGATIVE mg/dL        NEGATIVE       30 (A)   Urobilinogen, UA Latest Range: 0.0-1.0 mg/dL        0.2       0.2  Nitrite Latest Range: NEGATIVE         NEGATIVE       NEGATIVE  Leukocytes, UA Latest Range: NEGATIVE         SMALL (A)       LARGE (A)  Hgb urine dipstick Latest Range: NEGATIVE         NEGATIVE       NEGATIVE  WBC, UA Latest Range: <3 WBC/hpf  3-6       TOO NUMEROUS TO C...  Squamous Epithelial / LPF Latest Range: RARE         RARE       RARE  Bacteria, UA Latest Range: RARE         RARE       FEW (A)  LAB REPORT - SCANNED No range found Attch                CT HEAD WO CONTRAST No range found     Rpt            DG CHEST 2 VIEW No range found       Rpt          MR BRAIN WO CONTRAST No range found         Rpt        EKG 12-LEAD No range found  Rpt               ED EKG No range found  Rpt               EKG No range found          Attch       2D ECHOCARDIOGRAM WITH CONTRAST No range found              Rpt   DOPPLER CAROTID No range found            Rpt     TRANSCRANIAL DOPPLER No range found           Rpt        Discharge instructions: Discharge Instructions    Ambulatory referral to Neurology    Complete by:  As directed   Stroke patient. Dr. Leonie Man prefers follow up in 1 month     Diet - low sodium heart healthy    Complete by:  As directed      Increase activity slowly    Complete by:  As directed           01-Home or Self Care Follow-up Information    Follow up with SETHI,PRAMOD, MD. Schedule an appointment as soon as possible for a visit in 1 month.   Specialties:  Neurology, Radiology   Why:  Stroke Clinic, Office will call you with appointment date & time   Contact information:   [place] [address] [phone]       Disposition: home w/ HHPT and 24 hour assist  Follow-up Appts: Follow-up with Dr. Ardeth Perfect at [facility] in 3-7 days. We will  Call for appointment.  Condition on Discharge: stable  Tests Needing Follow-up: TTE and urine culture  and TC dopplars and A1C  Time spent in discharge (includes decision making & examination of pt): 45 minutes    Signed: Joanette Silveria 03/21/2014, 2:38 PM

## 2014-03-22 DIAGNOSIS — E785 Hyperlipidemia, unspecified: Secondary | ICD-10-CM | POA: Diagnosis not present

## 2014-03-22 DIAGNOSIS — I10 Essential (primary) hypertension: Secondary | ICD-10-CM | POA: Diagnosis not present

## 2014-03-22 DIAGNOSIS — E039 Hypothyroidism, unspecified: Secondary | ICD-10-CM | POA: Diagnosis not present

## 2014-03-22 DIAGNOSIS — N390 Urinary tract infection, site not specified: Secondary | ICD-10-CM | POA: Diagnosis not present

## 2014-03-22 DIAGNOSIS — M1990 Unspecified osteoarthritis, unspecified site: Secondary | ICD-10-CM | POA: Diagnosis not present

## 2014-03-22 DIAGNOSIS — F329 Major depressive disorder, single episode, unspecified: Secondary | ICD-10-CM | POA: Diagnosis not present

## 2014-03-22 DIAGNOSIS — Z8673 Personal history of transient ischemic attack (TIA), and cerebral infarction without residual deficits: Secondary | ICD-10-CM | POA: Diagnosis not present

## 2014-03-24 ENCOUNTER — Telehealth: Payer: Self-pay | Admitting: Neurology

## 2014-03-24 NOTE — Telephone Encounter (Signed)
Pt's son states you seen pt in the hospital this week and he wants you to give him a call to follow up on his mother. He missed you on Tuesday.  Please call and advise.

## 2014-03-24 NOTE — Telephone Encounter (Signed)
I called the only number listed for her in epic and got no reply and unable to leave message

## 2014-03-25 DIAGNOSIS — N390 Urinary tract infection, site not specified: Secondary | ICD-10-CM | POA: Diagnosis not present

## 2014-03-25 DIAGNOSIS — F329 Major depressive disorder, single episode, unspecified: Secondary | ICD-10-CM | POA: Diagnosis not present

## 2014-03-25 DIAGNOSIS — I10 Essential (primary) hypertension: Secondary | ICD-10-CM | POA: Diagnosis not present

## 2014-03-25 DIAGNOSIS — E039 Hypothyroidism, unspecified: Secondary | ICD-10-CM | POA: Diagnosis not present

## 2014-03-25 DIAGNOSIS — M1990 Unspecified osteoarthritis, unspecified site: Secondary | ICD-10-CM | POA: Diagnosis not present

## 2014-03-25 DIAGNOSIS — E785 Hyperlipidemia, unspecified: Secondary | ICD-10-CM | POA: Diagnosis not present

## 2014-03-25 DIAGNOSIS — Z8673 Personal history of transient ischemic attack (TIA), and cerebral infarction without residual deficits: Secondary | ICD-10-CM | POA: Diagnosis not present

## 2014-03-28 DIAGNOSIS — E039 Hypothyroidism, unspecified: Secondary | ICD-10-CM | POA: Diagnosis not present

## 2014-03-28 DIAGNOSIS — N390 Urinary tract infection, site not specified: Secondary | ICD-10-CM | POA: Diagnosis not present

## 2014-03-28 DIAGNOSIS — E785 Hyperlipidemia, unspecified: Secondary | ICD-10-CM | POA: Diagnosis not present

## 2014-03-28 DIAGNOSIS — I10 Essential (primary) hypertension: Secondary | ICD-10-CM | POA: Diagnosis not present

## 2014-03-28 DIAGNOSIS — M1990 Unspecified osteoarthritis, unspecified site: Secondary | ICD-10-CM | POA: Diagnosis not present

## 2014-03-28 DIAGNOSIS — Z8673 Personal history of transient ischemic attack (TIA), and cerebral infarction without residual deficits: Secondary | ICD-10-CM | POA: Diagnosis not present

## 2014-03-28 DIAGNOSIS — F329 Major depressive disorder, single episode, unspecified: Secondary | ICD-10-CM | POA: Diagnosis not present

## 2014-03-30 DIAGNOSIS — I10 Essential (primary) hypertension: Secondary | ICD-10-CM | POA: Diagnosis not present

## 2014-03-30 DIAGNOSIS — E039 Hypothyroidism, unspecified: Secondary | ICD-10-CM | POA: Diagnosis not present

## 2014-03-30 DIAGNOSIS — N390 Urinary tract infection, site not specified: Secondary | ICD-10-CM | POA: Diagnosis not present

## 2014-03-30 DIAGNOSIS — F329 Major depressive disorder, single episode, unspecified: Secondary | ICD-10-CM | POA: Diagnosis not present

## 2014-03-30 DIAGNOSIS — Z8673 Personal history of transient ischemic attack (TIA), and cerebral infarction without residual deficits: Secondary | ICD-10-CM | POA: Diagnosis not present

## 2014-03-30 DIAGNOSIS — E785 Hyperlipidemia, unspecified: Secondary | ICD-10-CM | POA: Diagnosis not present

## 2014-03-30 DIAGNOSIS — M1990 Unspecified osteoarthritis, unspecified site: Secondary | ICD-10-CM | POA: Diagnosis not present

## 2014-04-03 DIAGNOSIS — E039 Hypothyroidism, unspecified: Secondary | ICD-10-CM | POA: Diagnosis not present

## 2014-04-03 DIAGNOSIS — N390 Urinary tract infection, site not specified: Secondary | ICD-10-CM | POA: Diagnosis not present

## 2014-04-03 DIAGNOSIS — I10 Essential (primary) hypertension: Secondary | ICD-10-CM | POA: Diagnosis not present

## 2014-04-03 DIAGNOSIS — E785 Hyperlipidemia, unspecified: Secondary | ICD-10-CM | POA: Diagnosis not present

## 2014-04-03 DIAGNOSIS — F329 Major depressive disorder, single episode, unspecified: Secondary | ICD-10-CM | POA: Diagnosis not present

## 2014-04-03 DIAGNOSIS — Z8673 Personal history of transient ischemic attack (TIA), and cerebral infarction without residual deficits: Secondary | ICD-10-CM | POA: Diagnosis not present

## 2014-04-03 DIAGNOSIS — M1990 Unspecified osteoarthritis, unspecified site: Secondary | ICD-10-CM | POA: Diagnosis not present

## 2014-04-05 DIAGNOSIS — F329 Major depressive disorder, single episode, unspecified: Secondary | ICD-10-CM | POA: Diagnosis not present

## 2014-04-05 DIAGNOSIS — E785 Hyperlipidemia, unspecified: Secondary | ICD-10-CM | POA: Diagnosis not present

## 2014-04-05 DIAGNOSIS — E039 Hypothyroidism, unspecified: Secondary | ICD-10-CM | POA: Diagnosis not present

## 2014-04-05 DIAGNOSIS — Z8673 Personal history of transient ischemic attack (TIA), and cerebral infarction without residual deficits: Secondary | ICD-10-CM | POA: Diagnosis not present

## 2014-04-05 DIAGNOSIS — I10 Essential (primary) hypertension: Secondary | ICD-10-CM | POA: Diagnosis not present

## 2014-04-05 DIAGNOSIS — N390 Urinary tract infection, site not specified: Secondary | ICD-10-CM | POA: Diagnosis not present

## 2014-04-05 DIAGNOSIS — M1990 Unspecified osteoarthritis, unspecified site: Secondary | ICD-10-CM | POA: Diagnosis not present

## 2014-04-09 DIAGNOSIS — E785 Hyperlipidemia, unspecified: Secondary | ICD-10-CM | POA: Diagnosis not present

## 2014-04-09 DIAGNOSIS — G459 Transient cerebral ischemic attack, unspecified: Secondary | ICD-10-CM | POA: Diagnosis not present

## 2014-04-09 DIAGNOSIS — I10 Essential (primary) hypertension: Secondary | ICD-10-CM | POA: Diagnosis not present

## 2014-04-09 DIAGNOSIS — Z8669 Personal history of other diseases of the nervous system and sense organs: Secondary | ICD-10-CM | POA: Diagnosis not present

## 2014-04-09 DIAGNOSIS — Z6825 Body mass index (BMI) 25.0-25.9, adult: Secondary | ICD-10-CM | POA: Diagnosis not present

## 2014-04-09 DIAGNOSIS — Z79899 Other long term (current) drug therapy: Secondary | ICD-10-CM | POA: Diagnosis not present

## 2014-04-11 DIAGNOSIS — E039 Hypothyroidism, unspecified: Secondary | ICD-10-CM | POA: Diagnosis not present

## 2014-04-11 DIAGNOSIS — N390 Urinary tract infection, site not specified: Secondary | ICD-10-CM | POA: Diagnosis not present

## 2014-04-11 DIAGNOSIS — Z8673 Personal history of transient ischemic attack (TIA), and cerebral infarction without residual deficits: Secondary | ICD-10-CM | POA: Diagnosis not present

## 2014-04-11 DIAGNOSIS — I10 Essential (primary) hypertension: Secondary | ICD-10-CM | POA: Diagnosis not present

## 2014-04-11 DIAGNOSIS — E785 Hyperlipidemia, unspecified: Secondary | ICD-10-CM | POA: Diagnosis not present

## 2014-04-11 DIAGNOSIS — F329 Major depressive disorder, single episode, unspecified: Secondary | ICD-10-CM | POA: Diagnosis not present

## 2014-04-11 DIAGNOSIS — M1990 Unspecified osteoarthritis, unspecified site: Secondary | ICD-10-CM | POA: Diagnosis not present

## 2014-04-17 DIAGNOSIS — E785 Hyperlipidemia, unspecified: Secondary | ICD-10-CM | POA: Diagnosis not present

## 2014-04-17 DIAGNOSIS — M1990 Unspecified osteoarthritis, unspecified site: Secondary | ICD-10-CM | POA: Diagnosis not present

## 2014-04-17 DIAGNOSIS — Z8673 Personal history of transient ischemic attack (TIA), and cerebral infarction without residual deficits: Secondary | ICD-10-CM | POA: Diagnosis not present

## 2014-04-17 DIAGNOSIS — N390 Urinary tract infection, site not specified: Secondary | ICD-10-CM | POA: Diagnosis not present

## 2014-04-17 DIAGNOSIS — E039 Hypothyroidism, unspecified: Secondary | ICD-10-CM | POA: Diagnosis not present

## 2014-04-17 DIAGNOSIS — I10 Essential (primary) hypertension: Secondary | ICD-10-CM | POA: Diagnosis not present

## 2014-04-17 DIAGNOSIS — F329 Major depressive disorder, single episode, unspecified: Secondary | ICD-10-CM | POA: Diagnosis not present

## 2014-04-19 DIAGNOSIS — Z1389 Encounter for screening for other disorder: Secondary | ICD-10-CM | POA: Diagnosis not present

## 2014-04-19 DIAGNOSIS — Z6825 Body mass index (BMI) 25.0-25.9, adult: Secondary | ICD-10-CM | POA: Diagnosis not present

## 2014-04-19 DIAGNOSIS — I10 Essential (primary) hypertension: Secondary | ICD-10-CM | POA: Diagnosis not present

## 2014-04-19 DIAGNOSIS — G459 Transient cerebral ischemic attack, unspecified: Secondary | ICD-10-CM | POA: Diagnosis not present

## 2014-04-20 ENCOUNTER — Encounter: Payer: Self-pay | Admitting: Neurology

## 2014-04-20 ENCOUNTER — Ambulatory Visit (INDEPENDENT_AMBULATORY_CARE_PROVIDER_SITE_OTHER): Payer: Commercial Managed Care - HMO | Admitting: Neurology

## 2014-04-20 VITALS — BP 183/83 | HR 82 | Ht 62.0 in | Wt 137.0 lb

## 2014-04-20 DIAGNOSIS — G459 Transient cerebral ischemic attack, unspecified: Secondary | ICD-10-CM | POA: Diagnosis not present

## 2014-04-20 DIAGNOSIS — R404 Transient alteration of awareness: Secondary | ICD-10-CM | POA: Diagnosis not present

## 2014-04-20 NOTE — Progress Notes (Addendum)
[facility]    Provider:  Dr Jaynee Eagles Referring Provider: Darlin Coco, MD Primary Care Physician:  Velna Hatchet, MD  CC:  TIA  HPI:  Danielle Bryan is a 81 y.o. female here as a referral from Dr. Mare Ferrari for TIA  Danielle Bryan is an 81 y.o. female with a past medical history that is relevant for HTN, hypercholesterolemia, OA, admitted to [facility] due to inability to speak. Review of hospital notes state that patient was in her usual state of health until approximately 6 pm 03/19/2014 when her son came over for supper and noted that she was not able to express herself as she normally does. She said that she knew that she want it to say but couldn't get it out. That episode lasted for approximately 1.5 hours and resolved. She denied associPatient was not administered TPA secondary to symptoms iitially resolved in the ED and at time of worsening was out of the window. She was admitted for further evaluation and treatment.  Patient reports today that her son and her were having dinner. She knew what she was saying but it came out wrong. Then she went to the emergency room. Never lost consciousness. Never happened in the past. It got much better in the hospital. She was on ASA 81mg   stroke prevention beforehand. LDL 131. HgbA1c 5.4. She is on livalo now for HLD. The aphasia lasted one minute. Has not recurred. She Also reports she started tearing the pizza apart at dinner, which is strange. She never lost consciousness, she knew where she was. She says she is crying almost every day. She doesn't feel that sad but she thinks about her children and some difficulties her children are having.   Reviewed notes, labs and imaging from outside physicians, which showed:  This EEG is abnormal with mild to moderate continuous slowing of radioactivity involving the left cerebral hemisphere, most prominent involving the left temporal region. No clear epileptiform activity was recorded. Please be  aware that the lack of frank epileptiform activity in this recording does not rule out an epileptic disorder.  MRI of the brain: IMPRESSION: No acute or subacute infarction. Age related chronic small vessel ischemic changes throughout the brain as outlined above.  Review of Systems: Patient complains of symptoms per HPI as well as the following symptoms: No CP, No SOB. Pertinent negatives per HPI. All others negative.   History   Social History  . Marital Status: Married    Spouse Name: N/A  . Number of Children: N/A  . Years of Education: N/A   Occupational History  . Not on file.   Social History Main Topics  . Smoking status: Never Smoker   . Smokeless tobacco: Not on file  . Alcohol Use: Not on file  . Drug Use: Not on file  . Sexual Activity: Not on file   Other Topics Concern  . Not on file   Social History Narrative    Family History  Problem Relation Age of Onset  . Heart failure Mother   . Pneumonia Mother   . Heart failure Father     Past Medical History  Diagnosis Date  . Hypertension   . Hypothyroidism   . Osteoarthritis   . Hypercholesterolemia   . Reactive depression (situational)     No past surgical history on file.  Current Outpatient Prescriptions  Medication Sig Dispense Refill  . alendronate (FOSAMAX) 70 MG tablet Take 1 tablet by mouth once a week.     Marland Kitchen  cetaphil (CETAPHIL) cream Apply 1 application topically 2 (two) times daily as needed (dryness).    . clopidogrel (PLAVIX) 75 MG tablet Take 1 tablet (75 mg total) by mouth daily. 30 tablet 3  . levETIRAcetam (KEPPRA) 250 MG tablet Take 1 tablet (250 mg total) by mouth 2 (two) times daily. 60 tablet 1  . levothyroxine (SYNTHROID, LEVOTHROID) 100 MCG tablet TAKE 1 TABLET (100 MCG TOTAL) BY MOUTH DAILY. TAKING 6 DAYS A WEEK. DON'T TAKE ON SUNDAYS 90 tablet 0  . Multiple Vitamins-Minerals (CENTRUM PO) Take 1 tablet by mouth daily.    Marland Kitchen NAPROXEN SODIUM PO Take 1 tablet by mouth as needed.     . Omega-3 Fatty Acids (FISH OIL PO) Take 1 tablet by mouth 2 (two) times daily.     . pantoprazole (PROTONIX) 40 MG tablet Take 40 mg by mouth daily.    . Pitavastatin Calcium (LIVALO PO) Take 0.5 mg by mouth once a week.     . Vitamins A & D (VITAMIN A & D) 10000-400 UNITS CAPS     . ciprofloxacin (CIPRO) 250 MG tablet Take 1 tablet (250 mg total) by mouth 2 (two) times daily. (Patient not taking: Reported on 04/20/2014) 10 tablet 0  . clobetasol cream (TEMOVATE) 9.32 % Apply 1 application topically 2 (two) times daily.     No current facility-administered medications for this visit.    Allergies as of 04/20/2014 - Review Complete 04/20/2014  Allergen Reaction Noted  . Escitalopram oxalate  08/16/2010  . Lisinopril Other (See Comments) 11/19/2010  . Statins  08/16/2010  . Sulfa antibiotics  06/19/2011    Vitals: BP 187/83 mmHg  Pulse 86  Ht 5\' 2"  (1.575 m)  Wt 137 lb (62.143 kg)  BMI 25.05 kg/m2 Last Weight:  Wt Readings from Last 1 Encounters:  04/20/14 137 lb (62.143 kg)   Last Height:   Ht Readings from Last 1 Encounters:  04/20/14 5\' 2"  (1.575 m)    Physical exam: Exam: Gen: NAD, conversant, well nourised, well groomed                     CV: RRR, no MRG. No Carotid Bruits. No peripheral edema, warm, nontender Eyes: Conjunctivae clear without exudates or hemorrhage  Neuro: Detailed Neurologic Exam  Speech:    No aphasia or dysarthria  Cognition:    The patient is oriented to person, place, and time;     Cranial Nerves:    The pupils are equal, round, and reactive to light.  Visual fields are full to finger confrontation. Extraocular movements are intact. Trigeminal sensation is intact and the muscles of mastication are normal. The face is symmetric. The palate elevates in the midline. Hearing grossly intact. Voice is normal. Shoulder shrug is normal. The tongue has normal motion without fasciculations.    Gait:    No ataxia  Motor Observation:    No  asymmetry, no atrophy, and no involuntary movements noted. Tone:    Normal muscle tone.    Posture:    Posture is normal. normal erect    Strength:    Strength is V/V in the upper and lower limbs.      Sensation: intact to LT     Reflex Exam:    Assessment/Plan:  81 y.o. female admitted for recurrent episodes of isolated non fluent language without other abnormalities on neuro-exam and CT brain without acute abnormality. Stroke workup showed no acute pathology on MRi brain. EEG abnormal.   183/83,  in the office today but patient says that BP at home runs around 112, she was anxious to come and see [place] today so possibly white coat syndrome. Follow up with PCP as admitting BP was 204/78   Continue Livalo, LDL 131 goal < 70 HgbA1c 5.4  Patient counseled to be compliant with her antithrombotic medications, Plavix  Ongoing aggressive stroke risk factor management  She is on Keppra 250mg  twice daily. Will order EEG to follow up and speak with son for more information.  Will order TCD per Dr. Clydene Fake recommendations. She will follow up with Dr. Leonie Man afterwards.   Suggested she follow up with her pcp regarding her feelings of sadness and reported daily crying   Will discuss above with her son   [facility] [address] [place], [place] [address]  Phone [phone] Fax [phone]  A total of 30 minutes was spent in with this patient face-to-face. Over half this time was spent on counseling patient on the diagnosis of stroke and seizures and different therapeutic options available.

## 2014-04-20 NOTE — Patient Instructions (Signed)
Overall you are doing fairly well but I do want to suggest a few things today:   Remember to drink plenty of fluid, eat healthy meals and do not skip any meals. Try to eat protein with a every meal and eat a healthy snack such as fruit or nuts in between meals. Try to keep a regular sleep-wake schedule and try to exercise daily, particularly in the form of walking, 20-30 minutes a day, if you can.   As far as your medications are concerned, I would like to suggest: Continue current medications  As far as diagnostic testing: TCD doppler studies with Dr. Leonie Man, repeat EEG   Follow up with Dr. Leonie Man. Please call us with any interim questions, concerns, problems, updates or refill requests.   Please also call us for any test results so we can go over those with you on the phone.  My clinical assistant and will answer any of your questions and relay your messages to me and also relay most of my messages to you.   Our phone number is [phone]. We also have an after hours call service for urgent matters and there is a physician on-call for urgent questions. For any emergencies you know to call 911 or go to the nearest emergency room

## 2014-04-26 ENCOUNTER — Ambulatory Visit (INDEPENDENT_AMBULATORY_CARE_PROVIDER_SITE_OTHER): Payer: Commercial Managed Care - HMO | Admitting: Neurology

## 2014-04-26 DIAGNOSIS — R404 Transient alteration of awareness: Secondary | ICD-10-CM

## 2014-04-26 NOTE — Procedures (Signed)
    History:  Danielle Bryan is an 81 year old patient with recurring episodes of aphasia. An EEG study done previously shows some generalized slowing with some intermittent left brain slowing as well. The patient is being evaluated for possible seizure-type events.  This is a routine EEG. No skull defects are noted. Medications include Fosamax, Plavix, Keppra, Synthroid, multivitamins, fish oil, Protonix, Livalo, and naproxen.   EEG classification: Essentially normal awake  Description of the recording: The background rhythms of this recording consists of a fairly well modulated medium amplitude alpha rhythm of 9 Hz that is reactive to eye opening and closure. As the record progresses, the patient appears to remain in the waking state throughout the recording. Photic stimulation was performed, resulting in a bilateral and symmetric photic driving response. Hyperventilation was not performed. At no time during the recording does there appear to be evidence of spike or spike wave discharges or evidence of focal slowing. EKG monitor shows no evidence of cardiac rhythm abnormalities with a heart rate of 78.  Impression: This is an essentially normal EEG recording in the waking state. No evidence of ictal or interictal discharges are seen.

## 2014-04-30 ENCOUNTER — Telehealth: Payer: Self-pay | Admitting: Neurology

## 2014-04-30 NOTE — Telephone Encounter (Signed)
Spoke to patient. EEG was normal. MRi of the brain was unremarkable. Episode of confusion could have been TIA vs complex partial seizure but urinalysis also showed large leukocytes and she was treated with antibiotics, may have been confusion from a UTI. Unclear as patient can't really tell me what happened and son was not at her appointment. Will keep her on the keppra for now. Patient wants to discuss TCDs and other workup with stroke doctor before proceeding with the TCDs.   I will ask Dr. Erlinda Hong to meet with patient regarding TCDs and other workup (Dr Leonie Man suggested TCDs out patient). Please call son [name] so he can accompany patient to appointment [phone].  Called and left message for son, would be very helpful if he would accompany patient to her appointment.   Dr. Erlinda Hong - please advise if this is acceptable and I will ask Terrence Dupont to schedule an appointment with patient and her son on your schedule. Thank you.

## 2014-05-01 NOTE — Addendum Note (Signed)
Addended by: Sarina Ill B on: 05/01/2014 08:02 PM   Modules accepted: Orders

## 2014-05-01 NOTE — Telephone Encounter (Signed)
Dr. Erlinda Hong, I completely agree. Will let patient know. And will talk to son. Thanks.

## 2014-05-01 NOTE — Telephone Encounter (Signed)
Dr. Jaynee Eagles:  I have reviewed pt chart. Pt clearly had UTI with UA WBC "too numerous to count". For a 81 yo pt with underlying medical conditions, a bad UTI can give her encephalopathy with fluctuating symptoms such as confusion, difficulty speech and slowing on EEG. Her stroke work up including TCD has completed and Dr. Leonie Man has put in his report on 03/21/14. No further work up needed. Also, she is on plavix and statin, nothing else to offer for stroke secondary prevention.   Ideally, for continuity of care, pt should see Dr. Leonie Man as follow up. Since her storke work up has completed and on appropriate medication for stroke prevention as well as she is back to her neuro baseline, she can see Dr. Leonie Man next available. I can simply explain this to pt and her son if you would like me to do so. Thank you very much.   Rosalin Hawking, MD PhD Stroke Neurology 05/01/2014 5:07 PM

## 2014-05-01 NOTE — Telephone Encounter (Signed)
Spoke to son, the night of the episode she was trying to talk and it was gibberish. She couldn't complete a sentence, nothing made sense, by the time they left to [place] she was better. A nurse was asking her questions, she was jumbled again in the ED. Her temperature was 101.  It is possibly that these episodes were due to the UTI and elevated temperature, not complex partial seizure or TIA.  Discussed with son, of course it is impossible to say for sure. Asked son for his input, he will discuss with the patient.

## 2014-05-08 DIAGNOSIS — M1711 Unilateral primary osteoarthritis, right knee: Secondary | ICD-10-CM | POA: Diagnosis not present

## 2014-05-08 DIAGNOSIS — M1712 Unilateral primary osteoarthritis, left knee: Secondary | ICD-10-CM | POA: Diagnosis not present

## 2014-08-22 ENCOUNTER — Encounter: Payer: Self-pay | Admitting: Neurology

## 2014-08-22 ENCOUNTER — Ambulatory Visit (INDEPENDENT_AMBULATORY_CARE_PROVIDER_SITE_OTHER): Payer: Commercial Managed Care - HMO | Admitting: Neurology

## 2014-08-22 VITALS — BP 182/89 | HR 90 | Temp 98.0°F | Ht 62.0 in | Wt 137.6 lb

## 2014-08-22 DIAGNOSIS — R404 Transient alteration of awareness: Secondary | ICD-10-CM | POA: Diagnosis not present

## 2014-08-22 DIAGNOSIS — G9341 Metabolic encephalopathy: Secondary | ICD-10-CM | POA: Diagnosis not present

## 2014-08-22 DIAGNOSIS — N390 Urinary tract infection, site not specified: Secondary | ICD-10-CM | POA: Diagnosis not present

## 2014-08-22 NOTE — Progress Notes (Signed)
[facility]    Provider:  Dr Jaynee Eagles Referring Provider: Velna Hatchet, MD Primary Care Physician:  Velna Hatchet, MD  CC:  Follow-up for TIA versus seizure versus metabolic encephalopathy  HPI:  Danielle Bryan is a 81 y.o. female here as a follow-up  Interval update 08/22/2014: No more episodes. She is taking the plavix. No more episodes of alteration of awareness. Son is here with her. She is still having daily sadness and crying. Almost every day. Been going on for a few years and is worsening. She cries when her son leaves. Recently, it is worsening. She does want to get out more often and does not feel this has affected her interest and spending time with family and friends. She was on Zoloft years ago and she still cried. This appears to be a long-standing issue. Son provides a lot of the information today. Blood pressure is high again today in the office. He does not feel like she is depressed, but she is otherwise been quite emotional since he can remember.  Repeat EEG in March 2016. Normal.  Initial visit 04/20/2014: Danielle Bryan is a 81 y.o. female here as a referral from Dr. Mare Ferrari for TIA  Danielle Bryan is an 81 y.o. female with a past medical history that is relevant for HTN, hypercholesterolemia, OA, admitted to [facility] due to inability to speak. Review of hospital notes state that patient was in her usual state of health until approximately 6 pm 03/19/2014 when her son came over for supper and noted that she was not able to express herself as she normally does. She said that she knew that she want it to say but couldn't get it out. That episode lasted for approximately 1.5 hours and resolved. She denied associPatient was not administered TPA secondary to symptoms iitially resolved in the ED and at time of worsening was out of the window. She was admitted for further evaluation and treatment.  Patient reports today that her son and her were having dinner. She knew  what she was saying but it came out wrong. Then she went to the emergency room. Never lost consciousness. Never happened in the past. It got much better in the hospital. She was on ASA 81mg  stroke prevention beforehand. LDL 131. HgbA1c 5.4. She is on livalo now for HLD. The aphasia lasted one minute. Has not recurred. She Also reports she started tearing the pizza apart at dinner, which is strange. She never lost consciousness, she knew where she was. She says she is crying almost every day. She doesn't feel that sad but she thinks about her children and some difficulties her children are having.   Reviewed notes, labs and imaging from outside physicians, which showed:  This EEG is abnormal with mild to moderate continuous slowing of radioactivity involving the left cerebral hemisphere, most prominent involving the left temporal region. No clear epileptiform activity was recorded. Please be aware that the lack of frank epileptiform activity in this recording does not rule out an epileptic disorder.  MRI of the brain: IMPRESSION: No acute or subacute infarction. Age related chronic small vessel ischemic changes throughout the brain as outlined above.  Review of Systems: Patient complains of symptoms per HPI as well as the following symptoms: No chest pain, no shortness of breath. Pertinent negatives per HPI. All others negative.   History   Social History  . Marital Status: Married    Spouse Name: N/A  . Number of Children: 2  .  Years of Education: 12   Occupational History  . Not on file.   Social History Main Topics  . Smoking status: Never Smoker   . Smokeless tobacco: Not on file  . Alcohol Use: No  . Drug Use: No  . Sexual Activity: Not on file   Other Topics Concern  . Not on file   Social History Narrative   Lives at home alone.    Has 2 children.   Caffeine: occasional     Family History  Problem Relation Age of Onset  . Heart failure Mother   . Pneumonia Mother     . Heart failure Father     Past Medical History  Diagnosis Date  . Hypertension   . Hypothyroidism   . Osteoarthritis   . Hypercholesterolemia   . Reactive depression (situational)     Past Surgical History  Procedure Laterality Date  . Debridement and closure wound    . Cataract extraction Left 1992  . Cataract extraction Right 1995    Current Outpatient Prescriptions  Medication Sig Dispense Refill  . cetaphil (CETAPHIL) cream Apply 1 application topically 2 (two) times daily as needed (dryness).    . clobetasol cream (TEMOVATE) 2.44 % Apply 1 application topically 2 (two) times daily.    . clopidogrel (PLAVIX) 75 MG tablet Take 1 tablet (75 mg total) by mouth daily. 30 tablet 3  . levETIRAcetam (KEPPRA) 250 MG tablet Take 1 tablet (250 mg total) by mouth 2 (two) times daily. 60 tablet 1  . levothyroxine (SYNTHROID, LEVOTHROID) 100 MCG tablet TAKE 1 TABLET (100 MCG TOTAL) BY MOUTH DAILY. TAKING 6 DAYS A WEEK. DON'T TAKE ON SUNDAYS 90 tablet 0  . Multiple Vitamins-Minerals (CENTRUM PO) Take 1 tablet by mouth daily.    Marland Kitchen NAPROXEN SODIUM PO Take 1 tablet by mouth as needed.    . Omega-3 Fatty Acids (FISH OIL PO) Take 1 tablet by mouth 2 (two) times daily.     . pantoprazole (PROTONIX) 40 MG tablet Take 40 mg by mouth daily.    . Pitavastatin Calcium (LIVALO PO) Take 0.5 mg by mouth once a week.     . Vitamins A & D (VITAMIN A & D) 10000-400 UNITS CAPS     . alendronate (FOSAMAX) 70 MG tablet Take 1 tablet by mouth once a week.      No current facility-administered medications for this visit.    Allergies as of 08/22/2014 - Review Complete 04/20/2014  Allergen Reaction Noted  . Escitalopram oxalate  08/16/2010  . Lisinopril Other (See Comments) 11/19/2010  . Statins  08/16/2010  . Sulfa antibiotics  06/19/2011    Vitals: BP 182/89 mmHg  Pulse 90  Temp(Src) 98 F (36.7 C) (Oral)  Ht 5\' 2"  (1.575 m)  Wt 137 lb 9.6 oz (62.415 kg)  BMI 25.16 kg/m2 Last Weight:  Wt  Readings from Last 1 Encounters:  08/22/14 137 lb 9.6 oz (62.415 kg)   Last Height:   Ht Readings from Last 1 Encounters:  08/22/14 5\' 2"  (1.575 m)    Physical exam: Exam: Gen: NAD, conversant, well nourised, well groomed  CV: RRR, no MRG. No Carotid Bruits. No peripheral edema, warm, nontender Eyes: Conjunctivae clear without exudates or hemorrhage  Neuro: Detailed Neurologic Exam  Speech:  No aphasia or dysarthria  Cognition:  The patient is oriented to person, place, and time;    Cranial Nerves:  The pupils are equal, round, and reactive to light. Visual fields are full to finger  confrontation. Extraocular movements are intact. Trigeminal sensation is intact and the muscles of mastication are normal. The face is symmetric. The palate elevates in the midline. Hearing grossly intact. Voice is normal. Shoulder shrug is normal. The tongue has normal motion without fasciculations.    Gait:  No ataxia  Motor Observation:  No asymmetry, no atrophy, and no involuntary movements noted. Tone:  Normal muscle tone.   Posture:  Posture is normal. normal erect   Strength:  Strength is V/V in the upper and lower limbs.      Assessment/Plan: 81 y.o. female admitted for recurrent episodes of isolated non fluent language without other abnormalities on neuro-exam and CT brain without acute abnormality. Stroke workup showed no acute pathology on MRi brain. EEG abnormal while inpatient likely due to metabolic encephalopathy and repeat EEG negative. No repeat episode of transient altered awareness since office visit. Son present today.  Blood pressure high again in the office today and I highly encouraged patient and her son to follow-up with primary care and management of blood pressure.   Continue Livalo, LDL 131 goal < 70 HgbA1c 5.4  Patient counseled to be compliant with her antithrombotic medications, Plavix  Ongoing aggressive  stroke risk factor management  She is on Keppra 250mg  twice daily. Repeat EEG was negative and no further episodes. I do feel that her episode of altered awareness was likely metabolic encephalopathy due to UTI. We'll stop the Keppra  Suggested she follow up with her pcp regarding her feelings of sadness and reported daily crying    [facility] [address] [place], [place] [address]  Phone [phone] Fax [phone]  A total of 30 minutes was spent in with this patient face-to-face. Over half this time was spent on counseling patient on the diagnosis of metabolic encephalopathy versus TIA versus seizure and different therapeutic options available.

## 2014-08-22 NOTE — Patient Instructions (Signed)
Remember to drink plenty of fluid, eat healthy meals and do not skip any meals. Try to eat protein with a every meal and eat a healthy snack such as fruit or nuts in between meals. Try to keep a regular sleep-wake schedule and try to exercise daily, particularly in the form of walking, 20-30 minutes a day, if you can.   As far as your medications are concerned, I would like to suggest: Stop Keppra as discussed  I would like to see you back in 6 months, sooner if we need to. Please call us with any interim questions, concerns, problems, updates or refill requests.   Please also call us for any test results so we can go over those with you on the phone.  My clinical assistant and will answer any of your questions and relay your messages to me and also relay most of my messages to you.   Our phone number is [phone]. We also have an after hours call service for urgent matters and there is a physician on-call for urgent questions. For any emergencies you know to call 911 or go to the nearest emergency room

## 2014-08-24 DIAGNOSIS — I10 Essential (primary) hypertension: Secondary | ICD-10-CM | POA: Diagnosis not present

## 2014-08-24 DIAGNOSIS — Z6825 Body mass index (BMI) 25.0-25.9, adult: Secondary | ICD-10-CM | POA: Diagnosis not present

## 2014-10-19 DIAGNOSIS — I10 Essential (primary) hypertension: Secondary | ICD-10-CM | POA: Diagnosis not present

## 2014-10-19 DIAGNOSIS — G459 Transient cerebral ischemic attack, unspecified: Secondary | ICD-10-CM | POA: Diagnosis not present

## 2014-10-19 DIAGNOSIS — D692 Other nonthrombocytopenic purpura: Secondary | ICD-10-CM | POA: Diagnosis not present

## 2014-10-19 DIAGNOSIS — Z6825 Body mass index (BMI) 25.0-25.9, adult: Secondary | ICD-10-CM | POA: Diagnosis not present

## 2014-10-19 DIAGNOSIS — M810 Age-related osteoporosis without current pathological fracture: Secondary | ICD-10-CM | POA: Diagnosis not present

## 2014-12-26 DIAGNOSIS — E039 Hypothyroidism, unspecified: Secondary | ICD-10-CM | POA: Diagnosis not present

## 2014-12-26 DIAGNOSIS — M810 Age-related osteoporosis without current pathological fracture: Secondary | ICD-10-CM | POA: Diagnosis not present

## 2014-12-26 DIAGNOSIS — E785 Hyperlipidemia, unspecified: Secondary | ICD-10-CM | POA: Diagnosis not present

## 2015-01-02 DIAGNOSIS — M810 Age-related osteoporosis without current pathological fracture: Secondary | ICD-10-CM | POA: Diagnosis not present

## 2015-01-02 DIAGNOSIS — D692 Other nonthrombocytopenic purpura: Secondary | ICD-10-CM | POA: Diagnosis not present

## 2015-01-02 DIAGNOSIS — G459 Transient cerebral ischemic attack, unspecified: Secondary | ICD-10-CM | POA: Diagnosis not present

## 2015-01-02 DIAGNOSIS — Z0000 Encounter for general adult medical examination without abnormal findings: Secondary | ICD-10-CM | POA: Diagnosis not present

## 2015-01-02 DIAGNOSIS — E039 Hypothyroidism, unspecified: Secondary | ICD-10-CM | POA: Diagnosis not present

## 2015-01-02 DIAGNOSIS — F418 Other specified anxiety disorders: Secondary | ICD-10-CM | POA: Diagnosis not present

## 2015-01-02 DIAGNOSIS — I10 Essential (primary) hypertension: Secondary | ICD-10-CM | POA: Diagnosis not present

## 2015-01-02 DIAGNOSIS — Z23 Encounter for immunization: Secondary | ICD-10-CM | POA: Diagnosis not present

## 2015-01-02 DIAGNOSIS — E784 Other hyperlipidemia: Secondary | ICD-10-CM | POA: Diagnosis not present

## 2015-02-21 ENCOUNTER — Ambulatory Visit: Payer: Commercial Managed Care - HMO | Admitting: Neurology

## 2015-03-13 ENCOUNTER — Telehealth: Payer: Self-pay | Admitting: *Deleted

## 2015-03-13 ENCOUNTER — Ambulatory Visit (INDEPENDENT_AMBULATORY_CARE_PROVIDER_SITE_OTHER): Payer: Commercial Managed Care - HMO | Admitting: Neurology

## 2015-03-13 ENCOUNTER — Encounter: Payer: Self-pay | Admitting: Neurology

## 2015-03-13 ENCOUNTER — Encounter (INDEPENDENT_AMBULATORY_CARE_PROVIDER_SITE_OTHER): Payer: Self-pay

## 2015-03-13 VITALS — BP 220/90 | HR 83 | Temp 97.8°F | Ht 62.0 in | Wt 139.0 lb

## 2015-03-13 DIAGNOSIS — F329 Major depressive disorder, single episode, unspecified: Secondary | ICD-10-CM

## 2015-03-13 DIAGNOSIS — F32A Depression, unspecified: Secondary | ICD-10-CM

## 2015-03-13 NOTE — Telephone Encounter (Signed)
Called Dr Ardeth Perfect office per Dr Jaynee Eagles request to see what her BP's were at their office. Spoke to [employer]. Her last BP were:  November 2016: 128/78 August 2016: 146/78  Dr Jaynee Eagles aware.

## 2015-03-13 NOTE — Patient Instructions (Addendum)
Remember to drink plenty of fluid, eat healthy meals and do not skip any meals. Try to eat protein with a every meal and eat a healthy snack such as fruit or nuts in between meals. Try to keep a regular sleep-wake schedule and try to exercise daily, particularly in the form of walking, 20-30 minutes a day, if you can.   As far as diagnostic testing: Dr. Adele Schilder for depression, Dr. Ardeth Perfect for HTN  I would like to see you back as needed, sooner if we need to. Please call us with any interim questions, concerns, problems, updates or refill requests.   Our phone number is [phone]. We also have an after hours call service for urgent matters and there is a physician on-call for urgent questions. For any emergencies you know to call 911 or go to the nearest emergency room

## 2015-03-13 NOTE — Progress Notes (Signed)
[facility]    Provider:  Dr Jaynee Eagles Referring Provider: Velna Hatchet, MD Primary Care Physician:  Velna Hatchet, MD  CC: Follow-up for TIA versus seizure versus metabolic encephalopathy  HPI: Danielle Bryan is a 82 y.o. female here as a follow-up  Interval History 03/13/2015: Today blood pressure is 220/90. She is asymptomatic. No more confusional episodes. She has been fine since her metabolic encephalopathy.She is still having crying spells, sometimes all day. She needs follow up for her high blood pressure. She is very emotional. She is sad frequently, she lives alone and she is lonely and she has her son, she goes out a few times a week but otherwise she stays at home, she doesn't have the finances to go into independent care, Can follow up with primary care going forward. She sleeps well most times. She watches TV.  4/7 days she cries and gets emotional and sad.  She cries today in the office, she says her son lost his job.   Interval update 08/22/2014: No more episodes. She is taking the plavix. No more episodes of alteration of awareness. Son is here with her. She is still having daily sadness and crying. Almost every day. Been going on for a few years and is worsening. She cries when her son leaves. Recently, it is worsening. She does want to get out more often and does not feel this has affected her interest and spending time with family and friends. She was on Zoloft years ago and she still cried. This appears to be a long-standing issue. Son provides a lot of the information today. Blood pressure is high again today in the office. He does not feel like she is depressed, but she is otherwise been quite emotional since he can remember.  Repeat EEG in March 2016. Normal.  Initial visit 04/20/2014: Danielle Bryan is a 82 y.o. female here as a referral from Dr. Mare Ferrari for TIA  Danielle Bryan is an 82 y.o. female with a past medical history that is relevant for HTN,  hypercholesterolemia, OA, admitted to [facility] due to inability to speak. Review of hospital notes state that patient was in her usual state of health until approximately 6 pm 03/19/2014 when her son came over for supper and noted that she was not able to express herself as she normally does. She said that she knew that she want it to say but couldn't get it out. That episode lasted for approximately 1.5 hours and resolved. She denied associPatient was not administered TPA secondary to symptoms iitially resolved in the ED and at time of worsening was out of the window. She was admitted for further evaluation and treatment.  Patient reports today that her son and her were having dinner. She knew what she was saying but it came out wrong. Then she went to the emergency room. Never lost consciousness. Never happened in the past. It got much better in the hospital. She was on ASA 81mg  stroke prevention beforehand. LDL 131. HgbA1c 5.4. She is on livalo now for HLD. The aphasia lasted one minute. Has not recurred. She Also reports she started tearing the pizza apart at dinner, which is strange. She never lost consciousness, she knew where she was. She says she is crying almost every day. She doesn't feel that sad but she thinks about her children and some difficulties her children are having.   Reviewed notes, labs and imaging from outside physicians, which showed:  This EEG is abnormal with mild  to moderate continuous slowing of radioactivity involving the left cerebral hemisphere, most prominent involving the left temporal region. No clear epileptiform activity was recorded. Please be aware that the lack of frank epileptiform activity in this recording does not rule out an epileptic disorder.  MRI of the brain: IMPRESSION: No acute or subacute infarction. Age related chronic small vessel ischemic changes throughout the brain as outlined above.  Review of Systems: Patient complains of symptoms per HPI as well as  the following symptoms: No chest pain, no shortness of breath. Pertinent negatives per HPI. All others negative.    Social History   Social History  . Marital Status: Married    Spouse Name: N/A  . Number of Children: 2  . Years of Education: 12   Occupational History  . Not on file.   Social History Main Topics  . Smoking status: Never Smoker   . Smokeless tobacco: Not on file  . Alcohol Use: No  . Drug Use: No  . Sexual Activity: Not on file   Other Topics Concern  . Not on file   Social History Narrative   Lives at home alone.    Has 2 children.   Caffeine: occasional     Family History  Problem Relation Age of Onset  . Heart failure Mother   . Pneumonia Mother   . Heart failure Father     Past Medical History  Diagnosis Date  . Hypertension   . Hypothyroidism   . Osteoarthritis   . Hypercholesterolemia   . Reactive depression (situational)     Past Surgical History  Procedure Laterality Date  . Debridement and closure wound    . Cataract extraction Left 1992  . Cataract extraction Right 1995    Current Outpatient Prescriptions  Medication Sig Dispense Refill  . acetaminophen (TYLENOL) 325 MG tablet     . alendronate (FOSAMAX) 70 MG tablet Take 1 tablet by mouth once a week.     . Calcium Carb-Cholecalciferol (CALCIUM 600/VITAMIN D3) 600-800 MG-UNIT TABS Take 1 tablet by mouth daily.    . cetaphil (CETAPHIL) cream Apply 1 application topically 2 (two) times daily as needed (dryness).    . Cholecalciferol (VITAMIN D PO) Take 1,000 Units by mouth daily.    . clobetasol cream (TEMOVATE) [id number] % Apply 1 application topically 2 (two) times daily.    . clopidogrel (PLAVIX) 75 MG tablet Take 1 tablet (75 mg total) by mouth daily. 30 tablet 3  . levothyroxine (SYNTHROID, LEVOTHROID) 100 MCG tablet TAKE 1 TABLET (100 MCG TOTAL) BY MOUTH DAILY. TAKING 6 DAYS A WEEK. DON'T TAKE ON SUNDAYS 90 tablet 0  . Multiple Vitamins-Minerals (CENTRUM PO) Take 1 tablet by  mouth daily.    Marland Kitchen NAPROXEN SODIUM PO Take 1 tablet by mouth as needed.    . Omega-3 Fatty Acids (FISH OIL PO) Take 1 tablet by mouth 2 (two) times daily.     . pantoprazole (PROTONIX) 40 MG tablet Take 40 mg by mouth daily.    . Pitavastatin Calcium (LIVALO PO) Take 0.5 mg by mouth once a week.     . Vitamins A & D (VITAMIN A & D) 10000-400 UNITS CAPS      No current facility-administered medications for this visit.    Allergies as of 03/13/2015 - Review Complete 03/13/2015  Allergen Reaction Noted  . Escitalopram oxalate  08/16/2010  . Lisinopril Other (See Comments) 11/19/2010  . Statins  08/16/2010  . Sulfa antibiotics  06/19/2011  Vitals: BP 220/90 mmHg  Pulse 83  Temp(Src) 97.8 F (36.6 C) (Oral)  Ht 5\' 2"  (1.575 m)  Wt 139 lb (63.05 kg)  BMI 25.42 kg/m2  SpO2 98% Last Weight:  Wt Readings from Last 1 Encounters:  03/13/15 139 lb (63.05 kg)   Last Height:   Ht Readings from Last 1 Encounters:  03/13/15 5\' 2"  (1.575 m)     Neuro: Detailed Neurologic Exam  Speech:  No aphasia or dysarthria  Cognition:  The patient is oriented to person, place, and time;    Cranial Nerves:  The pupils are equal, round, and reactive to light. Visual fields are full to finger confrontation. Extraocular movements are intact. Trigeminal sensation is intact and the muscles of mastication are normal. The face is symmetric. The palate elevates in the midline. Hearing grossly intact. Voice is normal. Shoulder shrug is normal. The tongue has normal motion without fasciculations.    Gait:  No ataxia  Motor Observation:  No asymmetry, no atrophy, and no involuntary movements noted. Tone:  Normal muscle tone.   Posture:  Posture is normal. normal erect   Strength:  Strength is V/V in the upper and lower limbs.      Assessment/Plan: 82 y.o. female admitted for recurrent episodes of isolated non fluent language without other abnormalities on  neuro-exam and CT brain without acute abnormality. Stroke workup showed no acute pathology on MRi brain. EEG abnormal while inpatient likely due to metabolic encephalopathy and repeat EEG negative. No repeat episode of transient altered awareness since office visit.     Continue Livalo, LDL goal < 70 HgbA1c 5.4  Patient counseled to be compliant with her antithrombotic medications, Plavix  Ongoing aggressive stroke risk factor management  She stopped taking  Keppra 250mg  twice daily. Repeat EEG was negative and no further episodes. I do feel that her episode of altered awareness was likely metabolic encephalopathy due to UTI.   Suggested she follow up with her pcp regarding her feelings of sadness and reported daily crying  Hypertensive in the office today, called and spoke to her pcp office and scheduled an appointment for her.   Sarina Ill, MD  [facility] [address] [place], [place] [address]  Phone [phone] Fax [phone]  A total of 30 minutes was spent face-to-face with this patient. Over half this time was spent on counseling patient on the depression diagnosis and different diagnostic and therapeutic options available.

## 2015-03-13 NOTE — Telephone Encounter (Signed)
Pt has f/u scheduled this Friday 03/16/15 at 0930. Spoke to [facility] at Dr Ardeth Perfect office per Dr Jaynee Eagles request.

## 2015-03-16 DIAGNOSIS — Z1389 Encounter for screening for other disorder: Secondary | ICD-10-CM | POA: Diagnosis not present

## 2015-03-16 DIAGNOSIS — Z6825 Body mass index (BMI) 25.0-25.9, adult: Secondary | ICD-10-CM | POA: Diagnosis not present

## 2015-03-16 DIAGNOSIS — G459 Transient cerebral ischemic attack, unspecified: Secondary | ICD-10-CM | POA: Diagnosis not present

## 2015-03-16 DIAGNOSIS — F331 Major depressive disorder, recurrent, moderate: Secondary | ICD-10-CM | POA: Diagnosis not present

## 2015-03-16 DIAGNOSIS — D692 Other nonthrombocytopenic purpura: Secondary | ICD-10-CM | POA: Diagnosis not present

## 2015-03-16 DIAGNOSIS — I10 Essential (primary) hypertension: Secondary | ICD-10-CM | POA: Diagnosis not present

## 2015-03-16 DIAGNOSIS — E784 Other hyperlipidemia: Secondary | ICD-10-CM | POA: Diagnosis not present

## 2015-03-16 DIAGNOSIS — M810 Age-related osteoporosis without current pathological fracture: Secondary | ICD-10-CM | POA: Diagnosis not present

## 2015-03-16 DIAGNOSIS — Z23 Encounter for immunization: Secondary | ICD-10-CM | POA: Diagnosis not present

## 2015-03-17 ENCOUNTER — Observation Stay (HOSPITAL_COMMUNITY)
Admission: EM | Admit: 2015-03-17 | Discharge: 2015-03-19 | Disposition: A | Discharge status: Home / Self Care | Payer: Commercial Managed Care - HMO | Attending: Internal Medicine | Admitting: Internal Medicine

## 2015-03-17 DIAGNOSIS — F4321 Adjustment disorder with depressed mood: Secondary | ICD-10-CM | POA: Diagnosis not present

## 2015-03-17 DIAGNOSIS — R778 Other specified abnormalities of plasma proteins: Secondary | ICD-10-CM

## 2015-03-17 DIAGNOSIS — E7800 Pure hypercholesterolemia, unspecified: Secondary | ICD-10-CM | POA: Diagnosis not present

## 2015-03-17 DIAGNOSIS — N179 Acute kidney failure, unspecified: Secondary | ICD-10-CM | POA: Diagnosis present

## 2015-03-17 DIAGNOSIS — I10 Essential (primary) hypertension: Secondary | ICD-10-CM | POA: Insufficient documentation

## 2015-03-17 DIAGNOSIS — F329 Major depressive disorder, single episode, unspecified: Secondary | ICD-10-CM | POA: Diagnosis present

## 2015-03-17 DIAGNOSIS — Z79899 Other long term (current) drug therapy: Secondary | ICD-10-CM | POA: Diagnosis not present

## 2015-03-17 DIAGNOSIS — N289 Disorder of kidney and ureter, unspecified: Secondary | ICD-10-CM | POA: Insufficient documentation

## 2015-03-17 DIAGNOSIS — R7989 Other specified abnormal findings of blood chemistry: Secondary | ICD-10-CM | POA: Diagnosis present

## 2015-03-17 DIAGNOSIS — R531 Weakness: Principal | ICD-10-CM | POA: Insufficient documentation

## 2015-03-17 DIAGNOSIS — D696 Thrombocytopenia, unspecified: Secondary | ICD-10-CM | POA: Diagnosis present

## 2015-03-17 DIAGNOSIS — R789 Finding of unspecified substance, not normally found in blood: Secondary | ICD-10-CM | POA: Diagnosis not present

## 2015-03-17 DIAGNOSIS — M1990 Unspecified osteoarthritis, unspecified site: Secondary | ICD-10-CM | POA: Insufficient documentation

## 2015-03-17 DIAGNOSIS — R5383 Other fatigue: Secondary | ICD-10-CM | POA: Diagnosis present

## 2015-03-17 DIAGNOSIS — R740 Nonspecific elevation of levels of transaminase and lactic acid dehydrogenase [LDH]: Secondary | ICD-10-CM | POA: Insufficient documentation

## 2015-03-17 DIAGNOSIS — G459 Transient cerebral ischemic attack, unspecified: Secondary | ICD-10-CM | POA: Diagnosis present

## 2015-03-17 DIAGNOSIS — R112 Nausea with vomiting, unspecified: Secondary | ICD-10-CM | POA: Diagnosis present

## 2015-03-17 DIAGNOSIS — Z7902 Long term (current) use of antithrombotics/antiplatelets: Secondary | ICD-10-CM | POA: Diagnosis not present

## 2015-03-17 DIAGNOSIS — E860 Dehydration: Secondary | ICD-10-CM | POA: Diagnosis present

## 2015-03-17 DIAGNOSIS — E039 Hypothyroidism, unspecified: Secondary | ICD-10-CM | POA: Diagnosis not present

## 2015-03-17 DIAGNOSIS — R6889 Other general symptoms and signs: Secondary | ICD-10-CM | POA: Diagnosis not present

## 2015-03-17 HISTORY — DX: Transient cerebral ischemic attack, unspecified: G45.9

## 2015-03-17 NOTE — ED Notes (Signed)
Patient here from home via EMS with complaint of fatigue. Per EMS via son: patient slid off of edge of bed tonight and was found on the floor by her son. Son assisted patient to stand and sit back on bed, left the room briefly, and upon returning found patient laying on bed. When son attempted to arouse patient she wouldn't respond to him verbally; patient was conscious, but non-communicative. Upon EMS arrival patient's mental status was the same and after about 5 minutes she began to speak to them. Per EMS her only complaint was feeling tired. Patient was able to stand and ambulate a short distance with EMS.

## 2015-03-18 ENCOUNTER — Emergency Department (HOSPITAL_COMMUNITY): Payer: Commercial Managed Care - HMO

## 2015-03-18 ENCOUNTER — Encounter (HOSPITAL_COMMUNITY): Payer: Self-pay | Admitting: Emergency Medicine

## 2015-03-18 DIAGNOSIS — R778 Other specified abnormalities of plasma proteins: Secondary | ICD-10-CM | POA: Diagnosis present

## 2015-03-18 DIAGNOSIS — R112 Nausea with vomiting, unspecified: Secondary | ICD-10-CM | POA: Diagnosis present

## 2015-03-18 DIAGNOSIS — E872 Acidosis: Secondary | ICD-10-CM

## 2015-03-18 DIAGNOSIS — N179 Acute kidney failure, unspecified: Secondary | ICD-10-CM

## 2015-03-18 DIAGNOSIS — E860 Dehydration: Secondary | ICD-10-CM | POA: Diagnosis not present

## 2015-03-18 DIAGNOSIS — D696 Thrombocytopenia, unspecified: Secondary | ICD-10-CM

## 2015-03-18 DIAGNOSIS — R7989 Other specified abnormal findings of blood chemistry: Secondary | ICD-10-CM | POA: Diagnosis present

## 2015-03-18 DIAGNOSIS — R5383 Other fatigue: Secondary | ICD-10-CM | POA: Diagnosis not present

## 2015-03-18 DIAGNOSIS — R531 Weakness: Secondary | ICD-10-CM

## 2015-03-18 LAB — DIFFERENTIAL
BASOS ABS: 0 10*3/uL (ref 0.0–0.1)
BASOS PCT: 0 %
Eosinophils Absolute: 0 10*3/uL (ref 0.0–0.7)
Eosinophils Relative: 0 %
Lymphocytes Relative: 3 %
Lymphs Abs: 0.3 10*3/uL — ABNORMAL LOW (ref 0.7–4.0)
Monocytes Absolute: 0.4 10*3/uL (ref 0.1–1.0)
Monocytes Relative: 3 %
NEUTROS ABS: 12.2 10*3/uL — AB (ref 1.7–7.7)
NEUTROS PCT: 94 %

## 2015-03-18 LAB — BASIC METABOLIC PANEL
ANION GAP: 12 (ref 5–15)
BUN: 22 mg/dL — AB (ref 6–20)
CO2: 24 mmol/L (ref 22–32)
Calcium: 9.2 mg/dL (ref 8.9–10.3)
Chloride: 98 mmol/L — ABNORMAL LOW (ref 101–111)
Creatinine, Ser: 1.32 mg/dL — ABNORMAL HIGH (ref 0.44–1.00)
GFR, EST AFRICAN AMERICAN: 40 mL/min — AB (ref >60)
GFR, EST NON AFRICAN AMERICAN: 34 mL/min — AB (ref >60)
Glucose, Bld: 195 mg/dL — ABNORMAL HIGH (ref 65–99)
POTASSIUM: 4.3 mmol/L (ref 3.5–5.1)
SODIUM: 134 mmol/L — AB (ref 135–145)

## 2015-03-18 LAB — HEPATIC FUNCTION PANEL
ALBUMIN: 3.4 g/dL — AB (ref 3.5–5.0)
ALK PHOS: 59 U/L (ref 38–126)
ALT: 13 U/L — ABNORMAL LOW (ref 14–54)
AST: 24 U/L (ref 15–41)
Bilirubin, Direct: 0.2 mg/dL (ref 0.1–0.5)
Indirect Bilirubin: 1 mg/dL — ABNORMAL HIGH (ref 0.3–0.9)
TOTAL PROTEIN: 6 g/dL — AB (ref 6.5–8.1)
Total Bilirubin: 1.2 mg/dL (ref 0.3–1.2)

## 2015-03-18 LAB — CBC
HEMATOCRIT: 38.6 % (ref 36.0–46.0)
Hemoglobin: 12.9 g/dL (ref 12.0–15.0)
MCH: 29.7 pg (ref 26.0–34.0)
MCHC: 33.4 g/dL (ref 30.0–36.0)
MCV: 88.9 fL (ref 78.0–100.0)
Platelets: 129 10*3/uL — ABNORMAL LOW (ref 150–400)
RBC: 4.34 MIL/uL (ref 3.87–5.11)
RDW: 13.8 % (ref 11.5–15.5)
WBC: 12.9 10*3/uL — AB (ref 4.0–10.5)

## 2015-03-18 LAB — URINALYSIS, ROUTINE W REFLEX MICROSCOPIC
Bilirubin Urine: NEGATIVE
Glucose, UA: NEGATIVE mg/dL
Hgb urine dipstick: NEGATIVE
KETONES UR: NEGATIVE mg/dL
LEUKOCYTES UA: NEGATIVE
NITRITE: NEGATIVE
PH: 6 (ref 5.0–8.0)
PROTEIN: NEGATIVE mg/dL
Specific Gravity, Urine: 1.011 (ref 1.005–1.030)

## 2015-03-18 LAB — I-STAT CG4 LACTIC ACID, ED
LACTIC ACID, VENOUS: 1.02 mmol/L (ref 0.5–2.0)
LACTIC ACID, VENOUS: 2.4 mmol/L — AB (ref 0.5–2.0)

## 2015-03-18 LAB — TSH: TSH: 1.857 u[IU]/mL (ref 0.350–4.500)

## 2015-03-18 LAB — INFLUENZA PANEL BY PCR (TYPE A & B)
H1N1 flu by pcr: NOT DETECTED
INFLAPCR: NEGATIVE
Influenza B By PCR: NEGATIVE

## 2015-03-18 LAB — GLUCOSE, CAPILLARY
GLUCOSE-CAPILLARY: 116 mg/dL — AB (ref 65–99)
GLUCOSE-CAPILLARY: 108 mg/dL — AB (ref 65–99)

## 2015-03-18 LAB — CBG MONITORING, ED
GLUCOSE-CAPILLARY: 134 mg/dL — AB (ref 65–99)
Glucose-Capillary: 121 mg/dL — ABNORMAL HIGH (ref 65–99)

## 2015-03-18 LAB — TROPONIN I
TROPONIN I: 0.03 ng/mL (ref <0.031)
TROPONIN I: 0.04 ng/mL — AB (ref <0.031)

## 2015-03-18 LAB — CK: CK TOTAL: 57 U/L (ref 38–234)

## 2015-03-18 MED ORDER — LEVOTHYROXINE SODIUM 100 MCG PO TABS
100.0000 ug | ORAL_TABLET | Freq: Every day | ORAL | Status: DC
  Administered 2015-03-18 – 2015-03-19 (×2): 100 ug via ORAL
  Filled 2015-03-18 (×3): qty 1

## 2015-03-18 MED ORDER — ACETAMINOPHEN 325 MG PO TABS
650.0000 mg | ORAL_TABLET | Freq: Four times a day (QID) | ORAL | Status: DC | PRN
  Administered 2015-03-19: 650 mg via ORAL
  Filled 2015-03-18: qty 2

## 2015-03-18 MED ORDER — PANTOPRAZOLE SODIUM 40 MG PO TBEC
40.0000 mg | DELAYED_RELEASE_TABLET | Freq: Every day | ORAL | Status: DC
  Administered 2015-03-18 – 2015-03-19 (×2): 40 mg via ORAL
  Filled 2015-03-18 (×2): qty 1

## 2015-03-18 MED ORDER — ONDANSETRON HCL 4 MG PO TABS
4.0000 mg | ORAL_TABLET | Freq: Four times a day (QID) | ORAL | Status: DC | PRN
  Administered 2015-03-18 (×2): 4 mg via ORAL
  Filled 2015-03-18 (×2): qty 1

## 2015-03-18 MED ORDER — SODIUM CHLORIDE 0.9 % IV SOLN
1000.0000 mL | Freq: Once | INTRAVENOUS | Status: AC
  Administered 2015-03-18: 1000 mL via INTRAVENOUS

## 2015-03-18 MED ORDER — SODIUM CHLORIDE 0.9 % IV SOLN
INTRAVENOUS | Status: AC
  Administered 2015-03-18: 09:00:00 via INTRAVENOUS

## 2015-03-18 MED ORDER — INSULIN ASPART 100 UNIT/ML ~~LOC~~ SOLN: 0.0000 [IU] | Freq: Every day | SUBCUTANEOUS | Status: DC

## 2015-03-18 MED ORDER — ONDANSETRON HCL 4 MG/2ML IJ SOLN: 4.0000 mg | Freq: Four times a day (QID) | INTRAMUSCULAR | Status: DC | PRN

## 2015-03-18 MED ORDER — ACETAMINOPHEN 650 MG RE SUPP: 650.0000 mg | Freq: Four times a day (QID) | RECTAL | Status: DC | PRN

## 2015-03-18 MED ORDER — INSULIN ASPART 100 UNIT/ML ~~LOC~~ SOLN
0.0000 [IU] | Freq: Three times a day (TID) | SUBCUTANEOUS | Status: DC
  Administered 2015-03-18: 1 [IU] via SUBCUTANEOUS
  Filled 2015-03-18: qty 1

## 2015-03-18 MED ORDER — HYDROCODONE-ACETAMINOPHEN 5-325 MG PO TABS: 1.0000 | ORAL_TABLET | Freq: Four times a day (QID) | ORAL | Status: DC | PRN

## 2015-03-18 MED ORDER — ONDANSETRON HCL 4 MG/2ML IJ SOLN
4.0000 mg | Freq: Once | INTRAMUSCULAR | Status: AC
  Administered 2015-03-18: 4 mg via INTRAVENOUS
  Filled 2015-03-18: qty 2

## 2015-03-18 MED ORDER — SODIUM CHLORIDE 0.9 % IV SOLN
1000.0000 mL | INTRAVENOUS | Status: DC
  Administered 2015-03-18: 1000 mL via INTRAVENOUS

## 2015-03-18 MED ORDER — CLOPIDOGREL BISULFATE 75 MG PO TABS
75.0000 mg | ORAL_TABLET | Freq: Every day | ORAL | Status: DC
  Administered 2015-03-18 – 2015-03-19 (×2): 75 mg via ORAL
  Filled 2015-03-18 (×2): qty 1

## 2015-03-18 MED ORDER — ACETAMINOPHEN 325 MG PO TABS
650.0000 mg | ORAL_TABLET | Freq: Once | ORAL | Status: AC
  Administered 2015-03-18: 650 mg via ORAL
  Filled 2015-03-18: qty 2

## 2015-03-18 NOTE — ED Notes (Signed)
Patient transported to CT 

## 2015-03-18 NOTE — ED Provider Notes (Signed)
CSN: [id number]     Arrival date & time 03/17/15  2353 History  By signing my name below, I, Danielle Bryan, attest that this documentation has been prepared under the direction and in the presence of Delora Fuel, MD. Electronically Signed: Randa Bryan, ED Scribe. 03/18/2015. 12:25 AM.    Chief Complaint  Patient presents with  . Fatigue    The history is provided by the patient. No language interpreter was used.   HPI Comments: Danielle Bryan is a 82 y.o. female brought in by ambulance, who presents to the Emergency Department complaining of sudden fatigue onset tonight PTA. Pt states that tonight she slid off the bed and was not able to get back up. Pt states that she feels tired. Per EMS pt was not responding verbally on arrival. Pt doesn't report any other symptoms at this time.    Past Medical History  Diagnosis Date  . Hypertension   . Hypothyroidism   . Osteoarthritis   . Hypercholesterolemia   . Reactive depression (situational)    Past Surgical History  Procedure Laterality Date  . Debridement and closure wound    . Cataract extraction Left 1992  . Cataract extraction Right 1995   Family History  Problem Relation Age of Onset  . Heart failure Mother   . Pneumonia Mother   . Heart failure Father    Social History  Substance Use Topics  . Smoking status: Never Smoker   . Smokeless tobacco: None  . Alcohol Use: No   OB History    No data available     Review of Systems  Constitutional: Positive for fatigue. Negative for fever.  All other systems reviewed and are negative.     Allergies  Escitalopram oxalate; Lisinopril; Statins; and Sulfa antibiotics  Home Medications   Prior to Admission medications   Medication Sig Start Date End Date Taking? Authorizing Provider  acetaminophen (TYLENOL) 325 MG tablet  01/09/15   Historical Provider, MD  alendronate (FOSAMAX) 70 MG tablet Take 1 tablet by mouth once a week.  04/17/14   Historical Provider, MD   Calcium Carb-Cholecalciferol (CALCIUM 600/VITAMIN D3) 600-800 MG-UNIT TABS Take 1 tablet by mouth daily.    Historical Provider, MD  cetaphil (CETAPHIL) cream Apply 1 application topically 2 (two) times daily as needed (dryness).    Historical Provider, MD  Cholecalciferol (VITAMIN D PO) Take 1,000 Units by mouth daily. 12/05/14   Historical Provider, MD  clobetasol cream (TEMOVATE) [id number] % Apply 1 application topically 2 (two) times daily.    Historical Provider, MD  clopidogrel (PLAVIX) 75 MG tablet Take 1 tablet (75 mg total) by mouth daily. 03/21/14   Velna Hatchet, MD  levothyroxine (SYNTHROID, LEVOTHROID) 100 MCG tablet TAKE 1 TABLET (100 MCG TOTAL) BY MOUTH DAILY. TAKING 6 DAYS A WEEK. DON'T TAKE ON SUNDAYS    Darlin Coco, MD  Multiple Vitamins-Minerals (CENTRUM PO) Take 1 tablet by mouth daily.    Historical Provider, MD  NAPROXEN SODIUM PO Take 1 tablet by mouth as needed. 02/10/14   Historical Provider, MD  Omega-3 Fatty Acids (FISH OIL PO) Take 1 tablet by mouth 2 (two) times daily.     Historical Provider, MD  pantoprazole (PROTONIX) 40 MG tablet Take 40 mg by mouth daily.    Historical Provider, MD  Pitavastatin Calcium (LIVALO PO) Take 0.5 mg by mouth once a week.     Historical Provider, MD  Vitamins A & D (VITAMIN A & D) 10000-400 UNITS CAPS  02/10/14  Historical Provider, MD   BP 133/57 mmHg  Pulse 100  Temp(Src) 98.6 F (37 C) (Oral)  Resp 18  SpO2 99%   Physical Exam  Constitutional: She is oriented to person, place, and time. She appears well-developed and well-nourished. No distress.  HENT:  Head: Normocephalic and atraumatic.  Eyes: Conjunctivae and EOM are normal. Pupils are equal, round, and reactive to light.  Neck: Normal range of motion. Neck supple. No JVD present. Carotid bruit is not present.  Cardiovascular: Normal rate, regular rhythm and normal heart sounds.   No murmur heard. Pulmonary/Chest: Effort normal and breath sounds normal. She has no  wheezes. She has no rales. She exhibits no tenderness.  Abdominal: Soft. Bowel sounds are normal. She exhibits no distension and no mass. There is no tenderness.  Musculoskeletal: Normal range of motion. She exhibits no edema.  Lymphadenopathy:    She has no cervical adenopathy.  Neurological: She is alert and oriented to person, place, and time. No cranial nerve deficit. Coordination normal.  Skin: Skin is warm and dry. No rash noted.  Psychiatric: She has a normal mood and affect. Her behavior is normal.  Nursing note and vitals reviewed.   ED Course  Procedures (including critical care time) DIAGNOSTIC STUDIES: Oxygen Saturation is 99% on RA, normal by my interpretation.    COORDINATION OF CARE: 12:25 AM-Discussed treatment plan with pt at bedside and pt agreed to plan.     Labs Review Results for orders placed or performed during the hospital encounter of 0000000  Basic metabolic panel  Result Value Ref Range   Sodium 134 (L) 135 - 145 mmol/L   Potassium 4.3 3.5 - 5.1 mmol/L   Chloride 98 (L) 101 - 111 mmol/L   CO2 24 22 - 32 mmol/L   Glucose, Bld 195 (H) 65 - 99 mg/dL   BUN 22 (H) 6 - 20 mg/dL   Creatinine, Ser 1.32 (H) 0.44 - 1.00 mg/dL   Calcium 9.2 8.9 - 10.3 mg/dL   GFR calc non Af Amer 34 (L) >60 mL/min   GFR calc Af Amer 40 (L) >60 mL/min   Anion gap 12 5 - 15  CBC  Result Value Ref Range   WBC 12.9 (H) 4.0 - 10.5 K/uL   RBC 4.34 3.87 - 5.11 MIL/uL   Hemoglobin 12.9 12.0 - 15.0 g/dL   HCT 38.6 36.0 - 46.0 %   MCV 88.9 78.0 - 100.0 fL   MCH 29.7 26.0 - 34.0 pg   MCHC 33.4 30.0 - 36.0 g/dL   RDW 13.8 11.5 - 15.5 %   Platelets 129 (L) 150 - 400 K/uL  Urinalysis, Routine w reflex microscopic (not at [facility])  Result Value Ref Range   Color, Urine YELLOW YELLOW   APPearance HAZY (A) CLEAR   Specific Gravity, Urine 1.011 1.005 - 1.030   pH 6.0 5.0 - 8.0   Glucose, UA NEGATIVE NEGATIVE mg/dL   Hgb urine dipstick NEGATIVE NEGATIVE   Bilirubin Urine NEGATIVE  NEGATIVE   Ketones, ur NEGATIVE NEGATIVE mg/dL   Protein, ur NEGATIVE NEGATIVE mg/dL   Nitrite NEGATIVE NEGATIVE   Leukocytes, UA NEGATIVE NEGATIVE  Hepatic function panel  Result Value Ref Range   Total Protein 6.0 (L) 6.5 - 8.1 g/dL   Albumin 3.4 (L) 3.5 - 5.0 g/dL   AST 24 15 - 41 U/L   ALT 13 (L) 14 - 54 U/L   Alkaline Phosphatase 59 38 - 126 U/L   Total Bilirubin 1.2 0.3 -  1.2 mg/dL   Bilirubin, Direct 0.2 0.1 - 0.5 mg/dL   Indirect Bilirubin 1.0 (H) 0.3 - 0.9 mg/dL  Differential  Result Value Ref Range   Neutrophils Relative % 94 %   Neutro Abs 12.2 (H) 1.7 - 7.7 K/uL   Lymphocytes Relative 3 %   Lymphs Abs 0.3 (L) 0.7 - 4.0 K/uL   Monocytes Relative 3 %   Monocytes Absolute 0.4 0.1 - 1.0 K/uL   Eosinophils Relative 0 %   Eosinophils Absolute 0.0 0.0 - 0.7 K/uL   Basophils Relative 0 %   Basophils Absolute 0.0 0.0 - 0.1 K/uL  CK  Result Value Ref Range   Total CK 57 38 - 234 U/L  Troponin I  Result Value Ref Range   Troponin I 0.04 (H) <0.031 ng/mL  Troponin I  Result Value Ref Range   Troponin I 0.03 <0.031 ng/mL  CBG monitoring, ED  Result Value Ref Range   Glucose-Capillary 121 (H) 65 - 99 mg/dL  I-Stat CG4 Lactic Acid, ED  Result Value Ref Range   Lactic Acid, Venous 2.40 (HH) 0.5 - 2.0 mmol/L   Comment NOTIFIED PHYSICIAN   I-Stat CG4 Lactic Acid, ED  Result Value Ref Range   Lactic Acid, Venous 1.02 0.5 - 2.0 mmol/L   Imaging Review Ct Head Wo Contrast  03/18/2015  CLINICAL DATA:  Acute onset of sudden fatigue and tiredness. Initial encounter. EXAM: CT HEAD WITHOUT CONTRAST TECHNIQUE: Contiguous axial images were obtained from the base of the skull through the vertex without intravenous contrast. COMPARISON:  CT of the head performed 03/19/2014, and MRI of the brain from 03/20/2014 FINDINGS: There is no evidence of acute infarction, mass lesion, or intra- or extra-axial hemorrhage on CT. Prominence of the ventricles and sulci reflects mild cortical volume  loss. Scattered periventricular and subcortical white matter change likely reflects small vessel ischemic microangiopathy. The brainstem and fourth ventricle are within normal limits. The basal ganglia are unremarkable in appearance. The cerebral hemispheres demonstrate grossly normal gray-white differentiation. No mass effect or midline shift is seen. There is no evidence of fracture; visualized osseous structures are unremarkable in appearance. The orbits are within normal limits. The paranasal sinuses and mastoid air cells are well-aerated. No significant soft tissue abnormalities are seen. IMPRESSION: 1. No acute intracranial pathology seen on CT. 2. Mild cortical volume loss and scattered small vessel ischemic microangiopathy. Electronically Signed   By: Garald Balding M.D.   On: 03/18/2015 00:54      EKG Interpretation   Date/Time:  Sunday March 18 2015 01:22:49 EST Ventricular Rate:  87 PR Interval:  170 QRS Duration: 84 QT Interval:  360 QTC Calculation: 433 R Axis:   53 Text Interpretation:  Sinus rhythm Borderline ST elevation, anterior leads  When compared with ECG of 03/19/2014, No significant change was found  Confirmed by [facility]  MD, Linh Hedberg ([id number]) on 03/18/2015 1:41:59 AM      MDM   Final diagnoses:  Weakness  Elevated lactic acid level  Renal insufficiency  Elevated troponin I level   Weakness of uncertain cause. Old records are reviewed and she does have prior evaluations for syncope and altered level of consciousness and metabolic encephalopathy. Initial workup shows mildly elevated lactic acid and minimally elevated troponin. No ECG changes were seen, and history was not especially suggestive of cardiac event. She's given IV hydration and both test were repeated and came back normal. No source of infection was seen. Cause for her severe weaknesses unclear and  decision was made to admit her for additional observation and possible additional workup. Case is discussed care  and black, mid-level for Dr. Sherral Hammers of [facility] who agrees to admit her under observation status.   I personally performed the services described in this documentation, which was scribed in my presence. The recorded information has been reviewed and is accurate.        Delora Fuel, MD [id number]

## 2015-03-18 NOTE — ED Notes (Signed)
Patient began taking Lexapro on 03/15/2014. Dose was taken on that day, but not on 03/16/2014. Additionally patient was experiencing nausea and vomiting after taking this medication.

## 2015-03-18 NOTE — ED Notes (Signed)
CBG was 121.  

## 2015-03-18 NOTE — ED Notes (Signed)
Attempted report 

## 2015-03-18 NOTE — H&P (Signed)
[facility] History and Physical  [name] [id number] DOB: 1923-07-05 DOA: 03/17/2015  Referring physician: Roxanne Mins PCP: Velna Hatchet, MD   Chief Complaint: nausea/vomiting/generalized weakness  HPI: Danielle Bryan is a very pleasant 82 y.o. female with a past medical history that includes, hypertension, hypothyroidism, osteoarthritis, reactive depression presents to emergency department with chief complaint nausea with vomiting and generalized weakness. Initial evaluation in the emergency department reveals acute kidney injury elevated lactic acid and elevated troponin.  Information is obtained from the patient and her son who is at the bedside. She reports that 2 days ago she was started on a new medication Lexapro. Since that time she experience gradual worsening nausea. Yesterday she reports 3 episodes of emesis persistent nausea. She denies any abdominal pain, diarrhea, fever chills headache syncope or near-syncope. She denies any coffee ground emesis. She also reports decreased oral intake due to nausea. She reports she got up this morning to go to the restroom sitting on side of bed and just slid off the bed onto the floor. He was unable to get up unassisted. She denies chest pain palpitations shortness of breath diaphoresis. She denies difficulty swallowing there is no report of slurred speech.  In the emergency department she is afebrile hemodynamically stable and not hypoxic. She is provided with 1 L of normal saline and Zofran. Of the time of my exam she reports nausea improving and she is tolerating clear liquids.  Review of Systems:  10 point review of systems complete and all systems are negative except as indicated in the history of present illness  Past Medical History  Diagnosis Date  . Hypertension   . Hypothyroidism   . Osteoarthritis   . Hypercholesterolemia   . Reactive depression (situational)   . TIA (transient ischemic attack)     on plavix   Past  Surgical History  Procedure Laterality Date  . Debridement and closure wound    . Cataract extraction Left 1992  . Cataract extraction Right 1995   Social History:  reports that she has never smoked. She does not have any smokeless tobacco history on file. She reports that she does not drink alcohol or use illicit drugs. He lives at home alone she relates without assistance she is independent with ADLs she does not drive no recent falls Allergies  Allergen Reactions  . Escitalopram Oxalate Nausea Only  . Lisinopril Other (See Comments)    Very decreased blood pressure   . Statins     myalgias  . Sulfa Antibiotics     hives    Family History  Problem Relation Age of Onset  . Heart failure Mother   . Pneumonia Mother   . Heart failure Father      Prior to Admission medications   Medication Sig Start Date End Date Taking? Authorizing Provider  acetaminophen (TYLENOL) 325 MG tablet Take 325 mg by mouth every 6 (six) hours as needed for mild pain.  01/09/15  Yes Historical Provider, MD  Calcium Carb-Cholecalciferol (CALCIUM 600/VITAMIN D3) 600-800 MG-UNIT TABS Take 1 tablet by mouth 2 (two) times daily.    Yes Historical Provider, MD  cetaphil (CETAPHIL) cream Apply 1 application topically 2 (two) times daily as needed (dryness).   Yes Historical Provider, MD  Cholecalciferol (VITAMIN D PO) Take 1,000 Units by mouth daily. 12/05/14  Yes Historical Provider, MD  clopidogrel (PLAVIX) 75 MG tablet Take 1 tablet (75 mg total) by mouth daily. 03/21/14  Yes Velna Hatchet, MD  levothyroxine (SYNTHROID, LEVOTHROID)  100 MCG tablet TAKE 1 TABLET (100 MCG TOTAL) BY MOUTH DAILY. TAKING 6 DAYS A WEEK. DON'T TAKE ON SUNDAYS   Yes Darlin Coco, MD  Multiple Vitamins-Minerals (CENTRUM PO) Take 1 tablet by mouth daily.   Yes Historical Provider, MD  naproxen sodium (ANAPROX) 220 MG tablet Take 220 mg by mouth daily as needed (pain).   Yes Historical Provider, MD  pantoprazole (PROTONIX) 40 MG  tablet Take 40 mg by mouth daily.   Yes Historical Provider, MD  Pitavastatin Calcium (LIVALO) 2 MG TABS Take 2 mg by mouth once a week. Mondays   Yes Historical Provider, MD   Physical Exam: Filed Vitals:   03/18/15 0430 03/18/15 0500 03/18/15 0530 03/18/15 0600  BP: 147/64 149/55 153/59 133/107  Pulse: 86 86 85 92  Temp:      TempSrc:      Resp: 16 13 21 13   SpO2: 100% 98% 97% 98%    Wt Readings from Last 3 Encounters:  03/13/15 63.05 kg (139 lb)  08/22/14 62.415 kg (137 lb 9.6 oz)  04/20/14 62.143 kg (137 lb)    General:  Appears calm and comfortable, and what frail somewhat pale Eyes: PERRL, normal lids, irises & conjunctiva ENT: grossly normal hearing, mucous membranes of her mouth are pink but dry Neck: no LAD, masses or thyromegaly Cardiovascular: RRR, no m/r/g. No LE edema. Pedal pulses present and palpable Telemetry: SR, no arrhythmias  Respiratory: CTA bilaterally, no w/r/r. Normal respiratory effort. Abdomen: soft, ntnd is a bowel sounds somewhat sluggish no guarding or rebounding Skin: no rash or induration seen on limited exam Musculoskeletal: grossly normal tone BUE/BLE Psychiatric: grossly normal mood and affect, speech fluent and appropriate Neurologic: grossly non-focal. Speech clear facial symmetry lateral grip 5 out of 5 moves all extremities           Labs on Admission:  Basic Metabolic Panel:  Recent Labs Lab 03/18/15 0036  NA 134*  K 4.3  CL 98*  CO2 24  GLUCOSE 195*  BUN 22*  CREATININE 1.32*  CALCIUM 9.2   Liver Function Tests:  Recent Labs Lab 03/18/15 0036  AST 24  ALT 13*  ALKPHOS 59  BILITOT 1.2  PROT 6.0*  ALBUMIN 3.4*   No results for input(s): LIPASE, AMYLASE in the last 168 hours. No results for input(s): AMMONIA in the last 168 hours. CBC:  Recent Labs Lab 03/18/15 0036  WBC 12.9*  NEUTROABS 12.2*  HGB 12.9  HCT 38.6  MCV 88.9  PLT 129*   Cardiac Enzymes:  Recent Labs Lab 03/18/15 0036 03/18/15 0439    CKTOTAL 57  --   TROPONINI 0.04* 0.03    BNP (last 3 results) No results for input(s): BNP in the last 8760 hours.  ProBNP (last 3 results) No results for input(s): PROBNP in the last 8760 hours.  CBG:  Recent Labs Lab 03/18/15 0230  GLUCAP 121*    Radiological Exams on Admission: Ct Head Wo Contrast  03/18/2015  CLINICAL DATA:  Acute onset of sudden fatigue and tiredness. Initial encounter. EXAM: CT HEAD WITHOUT CONTRAST TECHNIQUE: Contiguous axial images were obtained from the base of the skull through the vertex without intravenous contrast. COMPARISON:  CT of the head performed 03/19/2014, and MRI of the brain from 03/20/2014 FINDINGS: There is no evidence of acute infarction, mass lesion, or intra- or extra-axial hemorrhage on CT. Prominence of the ventricles and sulci reflects mild cortical volume loss. Scattered periventricular and subcortical white matter change likely reflects small vessel  ischemic microangiopathy. The brainstem and fourth ventricle are within normal limits. The basal ganglia are unremarkable in appearance. The cerebral hemispheres demonstrate grossly normal gray-white differentiation. No mass effect or midline shift is seen. There is no evidence of fracture; visualized osseous structures are unremarkable in appearance. The orbits are within normal limits. The paranasal sinuses and mastoid air cells are well-aerated. No significant soft tissue abnormalities are seen. IMPRESSION: 1. No acute intracranial pathology seen on CT. 2. Mild cortical volume loss and scattered small vessel ischemic microangiopathy. Electronically Signed   By: Garald Balding M.D.   On: 03/18/2015 00:54    EKG: Independently reviewed. Sinus rhythm A see elevation no change from tracing a year ago  Assessment/Plan Principal Problem:   Nausea and vomiting Active Problems:   Hypothyroidism   Osteoarthritis   Reactive depression (situational)   TIA (transient ischemic attack)    Weakness   Acute kidney injury ([place])   Dehydration   Thrombocytopenia (H[facility])   Elevated lactic acid level   Elevated troponin  #1. Nausea and vomiting. Etiology uncertain but may be related to new medication versus mild gastroenteritis. She started Lexapro 2 days ago began feeling nauseous after that. no Diarrhea no abdominal pain. Improved at time of admission with Zofran -Admit for observation to medical floor -Gentle IV fluids -Zofran as needed -Discontinue Lexapro -Clear liquids and advance as tolerated -Influenza panel  #2. Acute kidney injury. Likely related to above. Creatinine 1.32 on admission. -Gentle IV fluids as noted above -hOld nephrotoxins -Monitor urine output -Recheck in the morning  #3. Elevated lactic acid/elevated troponin. Provided with 1 L normal saline in the ED and repeat lactic acid within limits of normal. EKG without acute changes. Troponin trending down on admission no chest pain. No events on telemetry -Continue IV fluids -Cycle troponins  #4. History of TIA. -Continue Plavix  #5. Generalized weakness. Likely related to above. Baseline is ambulates without assistance independent with ADLs -see therapies for #1. -Physical therapy  #6. Reactive depression. Chart review indicates chronic issue. -OP follow up  #7. Thrombocytopenia. Mild. Likely related to dehydration. No s/sx bleeding -OP follow up  #8. Oddly hypertensive in the emergency department. Home medications do not include any antihypertensives -Monitor closely -When necessary hydralazine  Code Status: limited DVT Prophylaxis: Family Communication: son at bedside Disposition Plan: home tomorrow  Time spent: [address] during the described time interval was provided by me .  I have reviewed this patient's available data, including medical history, events of note, physical examination, and all test results as part of my evaluation. I have  personally reviewed and interpreted all radiology studies. I have discussed the A&P with NP Dyanne Carrel and agree with above plan.

## 2015-03-19 DIAGNOSIS — R112 Nausea with vomiting, unspecified: Secondary | ICD-10-CM | POA: Diagnosis not present

## 2015-03-19 DIAGNOSIS — E860 Dehydration: Secondary | ICD-10-CM | POA: Diagnosis not present

## 2015-03-19 DIAGNOSIS — N179 Acute kidney failure, unspecified: Secondary | ICD-10-CM | POA: Diagnosis not present

## 2015-03-19 LAB — BASIC METABOLIC PANEL
Anion gap: 6 (ref 5–15)
BUN: 13 mg/dL (ref 6–20)
CALCIUM: 8.4 mg/dL — AB (ref 8.9–10.3)
CO2: 25 mmol/L (ref 22–32)
CREATININE: 0.85 mg/dL (ref 0.44–1.00)
Chloride: 105 mmol/L (ref 101–111)
GFR, EST NON AFRICAN AMERICAN: 59 mL/min — AB (ref >60)
Glucose, Bld: 112 mg/dL — ABNORMAL HIGH (ref 65–99)
Potassium: 3.9 mmol/L (ref 3.5–5.1)
SODIUM: 136 mmol/L (ref 135–145)

## 2015-03-19 LAB — CBC
HCT: 33.7 % — ABNORMAL LOW (ref 36.0–46.0)
Hemoglobin: 11.3 g/dL — ABNORMAL LOW (ref 12.0–15.0)
MCH: 29.7 pg (ref 26.0–34.0)
MCHC: 33.5 g/dL (ref 30.0–36.0)
MCV: 88.7 fL (ref 78.0–100.0)
PLATELETS: 112 10*3/uL — AB (ref 150–400)
RBC: 3.8 MIL/uL — AB (ref 3.87–5.11)
RDW: 14.1 % (ref 11.5–15.5)
WBC: 5.8 10*3/uL (ref 4.0–10.5)

## 2015-03-19 LAB — HEMOGLOBIN A1C
Hgb A1c MFr Bld: 5.6 % (ref 4.8–5.6)
Mean Plasma Glucose: 114 mg/dL

## 2015-03-19 LAB — GLUCOSE, CAPILLARY
GLUCOSE-CAPILLARY: 107 mg/dL — AB (ref 65–99)
Glucose-Capillary: 99 mg/dL (ref 65–99)
Glucose-Capillary: 105 mg/dL — ABNORMAL HIGH (ref 65–99)

## 2015-03-19 NOTE — Discharge Summary (Signed)
Physician Discharge Summary  Danielle Bryan [id number] DOB: 1924/02/02 DOA: 03/17/2015  PCP: Velna Hatchet, MD  Admit date: 03/17/2015 Discharge date: 03/19/2015  Time spent: 35 minutes  Recommendations for Outpatient Follow-up:  1. Please follow up on BMP on hospital follow-up visit, she was treated for acute renal failure during this hospitalization likely secondary to dehydration.   Discharge Diagnoses:  Principal Problem:   Nausea and vomiting Active Problems:   Hypothyroidism   Osteoarthritis   Reactive depression (situational)   TIA (transient ischemic attack)   Weakness   Acute kidney injury ([place])   Dehydration   Thrombocytopenia ([facility])   Elevated lactic acid level   Elevated troponin   Discharge Condition: Stable  Diet recommendation: Regular diet  There were no vitals filed for this visit.  History of present illness:  Danielle Bryan is a very pleasant 82 y.o. female with a past medical history that includes, hypertension, hypothyroidism, osteoarthritis, reactive depression presents to emergency department with chief complaint nausea with vomiting and generalized weakness. Initial evaluation in the emergency department reveals acute kidney injury elevated lactic acid and elevated troponin.  Information is obtained from the patient and her son who is at the bedside. She reports that 2 days ago she was started on a new medication Lexapro. Since that time she experience gradual worsening nausea. Yesterday she reports 3 episodes of emesis persistent nausea. She denies any abdominal pain, diarrhea, fever chills headache syncope or near-syncope. She denies any coffee ground emesis. She also reports decreased oral intake due to nausea. She reports she got up this morning to go to the restroom sitting on side of bed and just slid off the bed onto the floor. He was unable to get up unassisted. She denies chest pain palpitations shortness of breath diaphoresis. She denies difficulty  swallowing there is no report of slurred speech.  In the emergency department she is afebrile hemodynamically stable and not hypoxic. She is provided with 1 L of normal saline and Zofran. Of the time of my exam she reports nausea improving and she is tolerating clear liquids.  Hospital Course:  Danielle Bryan is a pleasant 82 year old female with a past medical history of hypertension, osteoarthritis, currently resides in the community, admitted to the medicine service on 03/16/2015 when she presented with complaints of nausea, vomiting as well as generalized weakness. Initial lab work revealed the development of acute kidney injury with creatinine of 1.32 with BUN of 22. She was given IV fluid resuscitation as renal failure was likely related to prerenal azotemia resulting from GI loss. By the following day creatinine improved to 0.85 with BUN of 13. She ambulated down the hallway to the nurse's station and back without any issues. She was tolerating by mouth intake with resolution of nausea or vomiting. I suspect nausea and vomiting was secondary to viral syndrome. She was discharged to her home in stable condition on 03/19/2015.   Discharge Exam: Filed Vitals:   03/19/15 0646 03/19/15 0802  BP: 151/77   Pulse: 93 86  Temp: 97.8 F (36.6 C) 98.8 F (37.1 C)  Resp: 18 18    General: Patient is awake and alert, ambulating down the hallway and back.  Cardiovascular: Regular rate and rhythm, normal S1 and S2 Respiratory: Normal respiratory effort Abdomen: Soft nontender nondistended Extremities: No edema  Discharge Instructions   Discharge Instructions    Call MD for:  difficulty breathing, headache or visual disturbances    Complete by:  As directed  Call MD for:  extreme fatigue    Complete by:  As directed      Call MD for:  hives    Complete by:  As directed      Call MD for:  persistant dizziness or light-headedness    Complete by:  As directed      Call MD for:  persistant  nausea and vomiting    Complete by:  As directed      Call MD for:  redness, tenderness, or signs of infection (pain, swelling, redness, odor or green/yellow discharge around incision site)    Complete by:  As directed      Call MD for:  severe uncontrolled pain    Complete by:  As directed      Call MD for:  temperature >100.4    Complete by:  As directed      Call MD for:    Complete by:  As directed      Diet - low sodium heart healthy    Complete by:  As directed      Increase activity slowly    Complete by:  As directed           Current Discharge Medication List    CONTINUE these medications which have NOT CHANGED   Details  acetaminophen (TYLENOL) 325 MG tablet Take 325 mg by mouth every 6 (six) hours as needed for mild pain.     Calcium Carb-Cholecalciferol (CALCIUM 600/VITAMIN D3) 600-800 MG-UNIT TABS Take 1 tablet by mouth 2 (two) times daily.     cetaphil (CETAPHIL) cream Apply 1 application topically 2 (two) times daily as needed (dryness).    Cholecalciferol (VITAMIN D PO) Take 1,000 Units by mouth daily.    clopidogrel (PLAVIX) 75 MG tablet Take 1 tablet (75 mg total) by mouth daily. Qty: 30 tablet, Refills: 3    levothyroxine (SYNTHROID, LEVOTHROID) 100 MCG tablet TAKE 1 TABLET (100 MCG TOTAL) BY MOUTH DAILY. TAKING 6 DAYS A WEEK. DON'T TAKE ON SUNDAYS Qty: 90 tablet, Refills: 0    Multiple Vitamins-Minerals (CENTRUM PO) Take 1 tablet by mouth daily.    pantoprazole (PROTONIX) 40 MG tablet Take 40 mg by mouth daily.    Pitavastatin Calcium (LIVALO) 2 MG TABS Take 2 mg by mouth once a week. Mondays      STOP taking these medications     naproxen sodium (ANAPROX) 220 MG tablet        Allergies  Allergen Reactions  . Escitalopram Oxalate Nausea Only  . Lisinopril Other (See Comments)    Very decreased blood pressure   . Statins     myalgias  . Sulfa Antibiotics     hives   Follow-up Information    Follow up with Velna Hatchet, MD In 1 week.    Specialty:  Internal Medicine   Contact information:   [place] [address] [phone]        The results of significant diagnostics from this hospitalization (including imaging, microbiology, ancillary and laboratory) are listed below for reference.    Significant Diagnostic Studies: Ct Head Wo Contrast  03/18/2015  CLINICAL DATA:  Acute onset of sudden fatigue and tiredness. Initial encounter. EXAM: CT HEAD WITHOUT CONTRAST TECHNIQUE: Contiguous axial images were obtained from the base of the skull through the vertex without intravenous contrast. COMPARISON:  CT of the head performed 03/19/2014, and MRI of the brain from 03/20/2014 FINDINGS: There is no evidence of acute infarction, mass lesion, or intra-  or extra-axial hemorrhage on CT. Prominence of the ventricles and sulci reflects mild cortical volume loss. Scattered periventricular and subcortical white matter change likely reflects small vessel ischemic microangiopathy. The brainstem and fourth ventricle are within normal limits. The basal ganglia are unremarkable in appearance. The cerebral hemispheres demonstrate grossly normal gray-white differentiation. No mass effect or midline shift is seen. There is no evidence of fracture; visualized osseous structures are unremarkable in appearance. The orbits are within normal limits. The paranasal sinuses and mastoid air cells are well-aerated. No significant soft tissue abnormalities are seen. IMPRESSION: 1. No acute intracranial pathology seen on CT. 2. Mild cortical volume loss and scattered small vessel ischemic microangiopathy. Electronically Signed   By: Garald Balding M.D.   On: 03/18/2015 00:54    Microbiology: No results found for this or any previous visit (from the past 240 hour(s)).   Labs: Basic Metabolic Panel:  Recent Labs Lab 03/18/15 0036 03/19/15 0703  NA 134* 136  K 4.3 3.9  CL 98* 105  CO2 24 25  GLUCOSE 195* 112*  BUN 22* 13  CREATININE 1.32*  0.85  CALCIUM 9.2 8.4*   Liver Function Tests:  Recent Labs Lab 03/18/15 0036  AST 24  ALT 13*  ALKPHOS 59  BILITOT 1.2  PROT 6.0*  ALBUMIN 3.4*   No results for input(s): LIPASE, AMYLASE in the last 168 hours. No results for input(s): AMMONIA in the last 168 hours. CBC:  Recent Labs Lab 03/18/15 0036 03/19/15 0703  WBC 12.9* 5.8  NEUTROABS 12.2*  --   HGB 12.9 11.3*  HCT 38.6 33.7*  MCV 88.9 88.7  PLT 129* 112*   Cardiac Enzymes:  Recent Labs Lab 03/18/15 0036 03/18/15 0439  CKTOTAL 57  --   TROPONINI 0.04* 0.03   BNP: BNP (last 3 results) No results for input(s): BNP in the last 8760 hours.  ProBNP (last 3 results) No results for input(s): PROBNP in the last 8760 hours.  CBG:  Recent Labs Lab 03/18/15 0230 03/18/15 0811 03/18/15 1317 03/18/15 2123 03/19/15 0732  GLUCAP 121* 134* 116* 108* 107*       Signed:  Kelvin Cellar MD.  [facility] 03/19/2015, 11:16 AM

## 2015-03-19 NOTE — Evaluation (Signed)
Physical Therapy Evaluation Patient Details Name: Danielle Bryan MRN: [id number] DOB: [birth year] Today's Date: 03/19/2015   History of Present Illness  Danielle Bryan is a very pleasant 82 y.o. female with a past medical history that includes, hypertension, hypothyroidism, osteoarthritis, reactive depression presents to emergency department with chief complaint nausea with vomiting and generalized weakness. Initial evaluation in the emergency department reveals acute kidney injury elevated lactic acid and elevated troponin.  Clinical Impression  Very pleasant 82 y.o. Female. PTA was Independent and living alone but son lives nearby and checks up often as well as runs errands. Pt presents today with generalized weakness and decreased activity tolerance requiring Min Guard w/o AD for transfers and ambulation. Upon D/C pt will return to sons house short term before returning home. Recommending 24/7 supervision initially with son, whose available, until pt is back to baseline. Recommending HHPT consult as pt lives alone and is worried she may be getting weaker and is scared of falling.       Follow Up Recommendations Home health PT;Supervision/Assistance - 24 hour;Supervision - Intermittent    Equipment Recommendations  None recommended by PT    Recommendations for Other Services OT consult     Precautions / Restrictions Precautions Precautions: Fall Restrictions Weight Bearing Restrictions: No      Mobility  Bed Mobility               General bed mobility comments: pt up in chair upon PT arrival  Transfers Overall transfer level: Needs assistance Equipment used: 1 person hand held assist Transfers: Sit to/from Stand Sit to Stand: Min assist         General transfer comment: Pt slow to stand. Stops halfway to standing to straighten back and allow knees time due to arthritis  Ambulation/Gait Ambulation/Gait assistance: Min guard;Min assist Ambulation Distance (Feet): 150  Feet Assistive device: 1 person hand held assist Gait Pattern/deviations: Step-through pattern;Decreased stride length;Trunk flexed     General Gait Details: Initially required HHA for steading as she had not been out of bed in a while. Progressed to [employer] after completing stairs  Stairs Stairs: Yes Stairs assistance: Min guard Stair Management: One rail Right;Step to pattern;Sideways Number of Stairs: 3 General stair comments: With BUE support on railing pt able to navigate stiars w/o assistance  Wheelchair Mobility    Modified Rankin (Stroke Patients Only)       Balance Overall balance assessment: Modified Independent                                           Pertinent Vitals/Pain Pain Assessment: No/denies pain    Home Living Family/patient expects to be discharged to:: Private residence Living Arrangements: Alone;Children (lives alone typically but going to sons home) Available Help at Discharge: Family Type of Home: House Home Access: Stairs to enter Entrance Stairs-Rails: Right Entrance Stairs-Number of Steps: 2 Home Layout: One level Home Equipment: Environmental consultant - 2 wheels;Cane - quad;Transport chair;Shower seat - built in Additional Comments: Pt does not use the shower/tub due to her husband falling years ago. Instead sponge bathes. Pt will return short term to her sins house before returning home independently    Prior Function Level of Independence: Independent with assistive device(s)         Comments: Typically does not use ADs. Does not drive. Son does grocery shopping and runs errands outside of the  hosue     Hand Dominance   Dominant Hand: Right    Extremity/Trunk Assessment   Upper Extremity Assessment: Overall WFL for tasks assessed           Lower Extremity Assessment: Generalized weakness      Cervical / Trunk Assessment: Normal  Communication   Communication: No difficulties  Cognition Arousal/Alertness:  Awake/alert Behavior During Therapy: WFL for tasks assessed/performed Overall Cognitive Status: Within Functional Limits for tasks assessed                      General Comments      Exercises        Assessment/Plan    PT Assessment Patient needs continued PT services  PT Diagnosis Generalized weakness   PT Problem List Decreased strength;Decreased activity tolerance;Decreased balance  PT Treatment Interventions Gait training;Stair training;Therapeutic activities;Therapeutic exercise;Balance training   PT Goals (Current goals can be found in the Care Plan section) Acute Rehab PT Goals Patient Stated Goal: get strong and continue walking PT Goal Formulation: With patient Time For Goal Achievement: 04/02/15 Potential to Achieve Goals: Good    Frequency Min 3X/week   Barriers to discharge        Co-evaluation               End of Session Equipment Utilized During Treatment: Gait belt Activity Tolerance: Patient limited by fatigue;Patient tolerated treatment well Patient left: in chair;with call bell/phone within reach Nurse Communication: Mobility status         Time: [id number] PT Time Calculation (min) (ACUTE ONLY): 14 min   Charges:   PT Evaluation $PT Eval Low Complexity: 1 Procedure     PT G Codes:        Ara Kussmaul 04-10-15, 10:20 AM Ara Kussmaul, Student Physical Therapist Acute Rehab [phone]

## 2015-03-20 DIAGNOSIS — D696 Thrombocytopenia, unspecified: Secondary | ICD-10-CM | POA: Diagnosis not present

## 2015-03-20 DIAGNOSIS — E785 Hyperlipidemia, unspecified: Secondary | ICD-10-CM | POA: Diagnosis not present

## 2015-03-20 DIAGNOSIS — E039 Hypothyroidism, unspecified: Secondary | ICD-10-CM | POA: Diagnosis not present

## 2015-03-20 DIAGNOSIS — I10 Essential (primary) hypertension: Secondary | ICD-10-CM | POA: Diagnosis not present

## 2015-03-20 DIAGNOSIS — M150 Primary generalized (osteo)arthritis: Secondary | ICD-10-CM | POA: Diagnosis not present

## 2015-03-20 DIAGNOSIS — F329 Major depressive disorder, single episode, unspecified: Secondary | ICD-10-CM | POA: Diagnosis not present

## 2015-03-20 DIAGNOSIS — Z8673 Personal history of transient ischemic attack (TIA), and cerebral infarction without residual deficits: Secondary | ICD-10-CM | POA: Diagnosis not present

## 2015-03-23 DIAGNOSIS — Z8673 Personal history of transient ischemic attack (TIA), and cerebral infarction without residual deficits: Secondary | ICD-10-CM | POA: Diagnosis not present

## 2015-03-23 DIAGNOSIS — D696 Thrombocytopenia, unspecified: Secondary | ICD-10-CM | POA: Diagnosis not present

## 2015-03-23 DIAGNOSIS — M150 Primary generalized (osteo)arthritis: Secondary | ICD-10-CM | POA: Diagnosis not present

## 2015-03-23 DIAGNOSIS — E039 Hypothyroidism, unspecified: Secondary | ICD-10-CM | POA: Diagnosis not present

## 2015-03-23 DIAGNOSIS — E785 Hyperlipidemia, unspecified: Secondary | ICD-10-CM | POA: Diagnosis not present

## 2015-03-23 DIAGNOSIS — F329 Major depressive disorder, single episode, unspecified: Secondary | ICD-10-CM | POA: Diagnosis not present

## 2015-03-23 DIAGNOSIS — I10 Essential (primary) hypertension: Secondary | ICD-10-CM | POA: Diagnosis not present

## 2015-03-26 DIAGNOSIS — D696 Thrombocytopenia, unspecified: Secondary | ICD-10-CM | POA: Diagnosis not present

## 2015-03-26 DIAGNOSIS — M150 Primary generalized (osteo)arthritis: Secondary | ICD-10-CM | POA: Diagnosis not present

## 2015-03-26 DIAGNOSIS — F329 Major depressive disorder, single episode, unspecified: Secondary | ICD-10-CM | POA: Diagnosis not present

## 2015-03-26 DIAGNOSIS — I10 Essential (primary) hypertension: Secondary | ICD-10-CM | POA: Diagnosis not present

## 2015-03-26 DIAGNOSIS — E039 Hypothyroidism, unspecified: Secondary | ICD-10-CM | POA: Diagnosis not present

## 2015-03-26 DIAGNOSIS — E785 Hyperlipidemia, unspecified: Secondary | ICD-10-CM | POA: Diagnosis not present

## 2015-03-26 DIAGNOSIS — Z8673 Personal history of transient ischemic attack (TIA), and cerebral infarction without residual deficits: Secondary | ICD-10-CM | POA: Diagnosis not present

## 2015-03-26 NOTE — Progress Notes (Signed)
Physical Therapy Treatment Note  These are the G-codes attached to PT evaluation from 04-03-22 at 8am.    04/04/15 0830  PT G-Codes **NOT FOR INPATIENT CLASS**  Functional Assessment Tool Used clinical judgement  Functional Limitation Mobility: Walking and moving around  Mobility: Walking and Moving Around Current Status [id number]) CI  Mobility: Walking and Moving Around Goal Status [id number]) CI    Kittie Plater, PT, DPT Pager #: [phone] Office #: [phone]

## 2015-03-28 DIAGNOSIS — E039 Hypothyroidism, unspecified: Secondary | ICD-10-CM | POA: Diagnosis not present

## 2015-03-28 DIAGNOSIS — E785 Hyperlipidemia, unspecified: Secondary | ICD-10-CM | POA: Diagnosis not present

## 2015-03-28 DIAGNOSIS — M150 Primary generalized (osteo)arthritis: Secondary | ICD-10-CM | POA: Diagnosis not present

## 2015-03-28 DIAGNOSIS — Z8673 Personal history of transient ischemic attack (TIA), and cerebral infarction without residual deficits: Secondary | ICD-10-CM | POA: Diagnosis not present

## 2015-03-28 DIAGNOSIS — I10 Essential (primary) hypertension: Secondary | ICD-10-CM | POA: Diagnosis not present

## 2015-03-28 DIAGNOSIS — F329 Major depressive disorder, single episode, unspecified: Secondary | ICD-10-CM | POA: Diagnosis not present

## 2015-03-28 DIAGNOSIS — D696 Thrombocytopenia, unspecified: Secondary | ICD-10-CM | POA: Diagnosis not present

## 2015-04-02 DIAGNOSIS — E785 Hyperlipidemia, unspecified: Secondary | ICD-10-CM | POA: Diagnosis not present

## 2015-04-02 DIAGNOSIS — E039 Hypothyroidism, unspecified: Secondary | ICD-10-CM | POA: Diagnosis not present

## 2015-04-02 DIAGNOSIS — I10 Essential (primary) hypertension: Secondary | ICD-10-CM | POA: Diagnosis not present

## 2015-04-02 DIAGNOSIS — D696 Thrombocytopenia, unspecified: Secondary | ICD-10-CM | POA: Diagnosis not present

## 2015-04-02 DIAGNOSIS — M150 Primary generalized (osteo)arthritis: Secondary | ICD-10-CM | POA: Diagnosis not present

## 2015-04-02 DIAGNOSIS — F329 Major depressive disorder, single episode, unspecified: Secondary | ICD-10-CM | POA: Diagnosis not present

## 2015-04-02 DIAGNOSIS — Z8673 Personal history of transient ischemic attack (TIA), and cerebral infarction without residual deficits: Secondary | ICD-10-CM | POA: Diagnosis not present

## 2015-04-06 DIAGNOSIS — F329 Major depressive disorder, single episode, unspecified: Secondary | ICD-10-CM | POA: Diagnosis not present

## 2015-04-06 DIAGNOSIS — E785 Hyperlipidemia, unspecified: Secondary | ICD-10-CM | POA: Diagnosis not present

## 2015-04-06 DIAGNOSIS — I10 Essential (primary) hypertension: Secondary | ICD-10-CM | POA: Diagnosis not present

## 2015-04-06 DIAGNOSIS — M150 Primary generalized (osteo)arthritis: Secondary | ICD-10-CM | POA: Diagnosis not present

## 2015-04-06 DIAGNOSIS — Z8673 Personal history of transient ischemic attack (TIA), and cerebral infarction without residual deficits: Secondary | ICD-10-CM | POA: Diagnosis not present

## 2015-04-06 DIAGNOSIS — E039 Hypothyroidism, unspecified: Secondary | ICD-10-CM | POA: Diagnosis not present

## 2015-04-06 DIAGNOSIS — D696 Thrombocytopenia, unspecified: Secondary | ICD-10-CM | POA: Diagnosis not present

## 2015-06-20 DIAGNOSIS — M170 Bilateral primary osteoarthritis of knee: Secondary | ICD-10-CM | POA: Diagnosis not present

## 2015-06-20 DIAGNOSIS — M19011 Primary osteoarthritis, right shoulder: Secondary | ICD-10-CM | POA: Diagnosis not present

## 2015-07-03 DIAGNOSIS — M1990 Unspecified osteoarthritis, unspecified site: Secondary | ICD-10-CM | POA: Diagnosis not present

## 2015-07-03 DIAGNOSIS — Z6825 Body mass index (BMI) 25.0-25.9, adult: Secondary | ICD-10-CM | POA: Diagnosis not present

## 2015-07-03 DIAGNOSIS — F331 Major depressive disorder, recurrent, moderate: Secondary | ICD-10-CM | POA: Diagnosis not present

## 2015-07-03 DIAGNOSIS — I10 Essential (primary) hypertension: Secondary | ICD-10-CM | POA: Diagnosis not present

## 2015-12-20 DIAGNOSIS — I10 Essential (primary) hypertension: Secondary | ICD-10-CM | POA: Diagnosis not present

## 2015-12-20 DIAGNOSIS — I493 Ventricular premature depolarization: Secondary | ICD-10-CM | POA: Diagnosis not present

## 2015-12-20 DIAGNOSIS — E784 Other hyperlipidemia: Secondary | ICD-10-CM | POA: Diagnosis not present

## 2015-12-21 DIAGNOSIS — E784 Other hyperlipidemia: Secondary | ICD-10-CM | POA: Diagnosis not present

## 2015-12-21 DIAGNOSIS — I493 Ventricular premature depolarization: Secondary | ICD-10-CM | POA: Diagnosis not present

## 2015-12-21 DIAGNOSIS — I10 Essential (primary) hypertension: Secondary | ICD-10-CM | POA: Diagnosis not present

## 2016-01-01 DIAGNOSIS — E784 Other hyperlipidemia: Secondary | ICD-10-CM | POA: Diagnosis not present

## 2016-01-01 DIAGNOSIS — M810 Age-related osteoporosis without current pathological fracture: Secondary | ICD-10-CM | POA: Diagnosis not present

## 2016-01-01 DIAGNOSIS — E038 Other specified hypothyroidism: Secondary | ICD-10-CM | POA: Diagnosis not present

## 2016-01-08 DIAGNOSIS — R7309 Other abnormal glucose: Secondary | ICD-10-CM | POA: Diagnosis not present

## 2016-01-08 DIAGNOSIS — E784 Other hyperlipidemia: Secondary | ICD-10-CM | POA: Diagnosis not present

## 2016-01-08 DIAGNOSIS — F331 Major depressive disorder, recurrent, moderate: Secondary | ICD-10-CM | POA: Diagnosis not present

## 2016-01-08 DIAGNOSIS — R358 Other polyuria: Secondary | ICD-10-CM | POA: Diagnosis not present

## 2016-01-08 DIAGNOSIS — Z0000 Encounter for general adult medical examination without abnormal findings: Secondary | ICD-10-CM | POA: Diagnosis not present

## 2016-01-08 DIAGNOSIS — D692 Other nonthrombocytopenic purpura: Secondary | ICD-10-CM | POA: Diagnosis not present

## 2016-01-08 DIAGNOSIS — Z6826 Body mass index (BMI) 26.0-26.9, adult: Secondary | ICD-10-CM | POA: Diagnosis not present

## 2016-01-08 DIAGNOSIS — I10 Essential (primary) hypertension: Secondary | ICD-10-CM | POA: Diagnosis not present

## 2016-01-08 DIAGNOSIS — M810 Age-related osteoporosis without current pathological fracture: Secondary | ICD-10-CM | POA: Diagnosis not present

## 2016-01-08 DIAGNOSIS — G459 Transient cerebral ischemic attack, unspecified: Secondary | ICD-10-CM | POA: Diagnosis not present

## 2016-03-19 ENCOUNTER — Observation Stay (HOSPITAL_COMMUNITY)
Admission: EM | Admit: 2016-03-19 | Discharge: 2016-03-21 | Disposition: A | Discharge status: Home / Self Care | Payer: Medicare HMO | Attending: Internal Medicine | Admitting: Internal Medicine

## 2016-03-19 DIAGNOSIS — G459 Transient cerebral ischemic attack, unspecified: Secondary | ICD-10-CM | POA: Diagnosis not present

## 2016-03-19 DIAGNOSIS — R4701 Aphasia: Secondary | ICD-10-CM | POA: Insufficient documentation

## 2016-03-19 DIAGNOSIS — E039 Hypothyroidism, unspecified: Secondary | ICD-10-CM | POA: Diagnosis not present

## 2016-03-19 DIAGNOSIS — Z6827 Body mass index (BMI) 27.0-27.9, adult: Secondary | ICD-10-CM | POA: Diagnosis not present

## 2016-03-19 DIAGNOSIS — R8271 Bacteriuria: Secondary | ICD-10-CM | POA: Insufficient documentation

## 2016-03-19 DIAGNOSIS — Z79899 Other long term (current) drug therapy: Secondary | ICD-10-CM | POA: Insufficient documentation

## 2016-03-19 DIAGNOSIS — Z8673 Personal history of transient ischemic attack (TIA), and cerebral infarction without residual deficits: Secondary | ICD-10-CM | POA: Diagnosis not present

## 2016-03-19 DIAGNOSIS — E7800 Pure hypercholesterolemia, unspecified: Secondary | ICD-10-CM | POA: Insufficient documentation

## 2016-03-19 DIAGNOSIS — I10 Essential (primary) hypertension: Secondary | ICD-10-CM | POA: Diagnosis not present

## 2016-03-19 DIAGNOSIS — R030 Elevated blood-pressure reading, without diagnosis of hypertension: Secondary | ICD-10-CM | POA: Diagnosis not present

## 2016-03-19 DIAGNOSIS — Z7902 Long term (current) use of antithrombotics/antiplatelets: Secondary | ICD-10-CM | POA: Insufficient documentation

## 2016-03-19 DIAGNOSIS — E785 Hyperlipidemia, unspecified: Secondary | ICD-10-CM | POA: Insufficient documentation

## 2016-03-19 DIAGNOSIS — E663 Overweight: Secondary | ICD-10-CM | POA: Insufficient documentation

## 2016-03-20 ENCOUNTER — Encounter (HOSPITAL_COMMUNITY): Payer: Self-pay | Admitting: Emergency Medicine

## 2016-03-20 ENCOUNTER — Observation Stay (HOSPITAL_COMMUNITY): Payer: Medicare HMO

## 2016-03-20 ENCOUNTER — Observation Stay (HOSPITAL_COMMUNITY)
Admit: 2016-03-20 | Discharge: 2016-03-20 | Disposition: A | Discharge status: Home / Self Care | Payer: Medicare HMO | Attending: Neurology | Admitting: Neurology

## 2016-03-20 ENCOUNTER — Observation Stay: Payer: Commercial Managed Care - HMO

## 2016-03-20 ENCOUNTER — Emergency Department (HOSPITAL_COMMUNITY): Payer: Medicare HMO

## 2016-03-20 ENCOUNTER — Observation Stay (HOSPITAL_BASED_OUTPATIENT_CLINIC_OR_DEPARTMENT_OTHER): Payer: Medicare HMO

## 2016-03-20 DIAGNOSIS — E039 Hypothyroidism, unspecified: Secondary | ICD-10-CM

## 2016-03-20 DIAGNOSIS — R4701 Aphasia: Secondary | ICD-10-CM

## 2016-03-20 DIAGNOSIS — G458 Other transient cerebral ischemic attacks and related syndromes: Secondary | ICD-10-CM | POA: Diagnosis not present

## 2016-03-20 DIAGNOSIS — G459 Transient cerebral ischemic attack, unspecified: Secondary | ICD-10-CM

## 2016-03-20 DIAGNOSIS — I10 Essential (primary) hypertension: Secondary | ICD-10-CM

## 2016-03-20 LAB — CBC WITH DIFFERENTIAL/PLATELET
Basophils Absolute: 0 10*3/uL (ref 0.0–0.1)
Basophils Relative: 0 %
EOS PCT: 2 %
Eosinophils Absolute: 0.1 10*3/uL (ref 0.0–0.7)
HCT: 37.5 % (ref 36.0–46.0)
HEMOGLOBIN: 12.4 g/dL (ref 12.0–15.0)
LYMPHS ABS: 2 10*3/uL (ref 0.7–4.0)
LYMPHS PCT: 26 %
MCH: 29.6 pg (ref 26.0–34.0)
MCHC: 33.1 g/dL (ref 30.0–36.0)
MCV: 89.5 fL (ref 78.0–100.0)
MONOS PCT: 13 %
Monocytes Absolute: 1 10*3/uL (ref 0.1–1.0)
Neutro Abs: 4.6 10*3/uL (ref 1.7–7.7)
Neutrophils Relative %: 59 %
PLATELETS: 175 10*3/uL (ref 150–400)
RBC: 4.19 MIL/uL (ref 3.87–5.11)
RDW: 13.6 % (ref 11.5–15.5)
WBC: 7.7 10*3/uL (ref 4.0–10.5)

## 2016-03-20 LAB — COMPREHENSIVE METABOLIC PANEL
ALBUMIN: 3.7 g/dL (ref 3.5–5.0)
ALK PHOS: 72 U/L (ref 38–126)
ALT: 11 U/L — ABNORMAL LOW (ref 14–54)
ALT: 12 U/L — AB (ref 14–54)
AST: 22 U/L (ref 15–41)
AST: 23 U/L (ref 15–41)
Albumin: 4.2 g/dL (ref 3.5–5.0)
Alkaline Phosphatase: 64 U/L (ref 38–126)
Anion gap: 12 (ref 5–15)
Anion gap: 9 (ref 5–15)
BUN: 22 mg/dL — ABNORMAL HIGH (ref 6–20)
BUN: 17 mg/dL (ref 6–20)
CALCIUM: 9.6 mg/dL (ref 8.9–10.3)
CHLORIDE: 105 mmol/L (ref 101–111)
CO2: 22 mmol/L (ref 22–32)
CO2: 24 mmol/L (ref 22–32)
CREATININE: 0.88 mg/dL (ref 0.44–1.00)
Calcium: 9.6 mg/dL (ref 8.9–10.3)
Chloride: 99 mmol/L — ABNORMAL LOW (ref 101–111)
Creatinine, Ser: 0.84 mg/dL (ref 0.44–1.00)
GFR calc Af Amer: >60 mL/min (ref >60)
GFR calc non Af Amer: 56 mL/min — ABNORMAL LOW (ref >60)
GFR, EST NON AFRICAN AMERICAN: 59 mL/min — AB (ref >60)
GLUCOSE: 123 mg/dL — AB (ref 65–99)
Glucose, Bld: 131 mg/dL — ABNORMAL HIGH (ref 65–99)
POTASSIUM: 4 mmol/L (ref 3.5–5.1)
Potassium: 3.9 mmol/L (ref 3.5–5.1)
SODIUM: 138 mmol/L (ref 135–145)
Sodium: 133 mmol/L — ABNORMAL LOW (ref 135–145)
Total Bilirubin: 0.5 mg/dL (ref 0.3–1.2)
Total Bilirubin: 0.7 mg/dL (ref 0.3–1.2)
Total Protein: 6.9 g/dL (ref 6.5–8.1)
Total Protein: 6.1 g/dL — ABNORMAL LOW (ref 6.5–8.1)

## 2016-03-20 LAB — ECHOCARDIOGRAM COMPLETE
HEIGHTINCHES: 61 in
WEIGHTICAEL: 2283.2 [oz_av]

## 2016-03-20 LAB — CBC
HCT: 37 % (ref 36.0–46.0)
Hemoglobin: 12 g/dL (ref 12.0–15.0)
MCH: 29.3 pg (ref 26.0–34.0)
MCHC: 32.4 g/dL (ref 30.0–36.0)
MCV: 90.2 fL (ref 78.0–100.0)
PLATELETS: 153 10*3/uL (ref 150–400)
RBC: 4.1 MIL/uL (ref 3.87–5.11)
RDW: 13.7 % (ref 11.5–15.5)
WBC: 6.4 10*3/uL (ref 4.0–10.5)

## 2016-03-20 LAB — URINALYSIS, ROUTINE W REFLEX MICROSCOPIC
BILIRUBIN URINE: NEGATIVE
Bacteria, UA: NONE SEEN
GLUCOSE, UA: NEGATIVE mg/dL
KETONES UR: NEGATIVE mg/dL
NITRITE: NEGATIVE
PROTEIN: 30 mg/dL — AB
SPECIFIC GRAVITY, URINE: 1.008 (ref 1.005–1.030)
pH: 5 (ref 5.0–8.0)

## 2016-03-20 LAB — LIPID PANEL
CHOL/HDL RATIO: 2.3 ratio
Cholesterol: 197 mg/dL (ref 0–200)
HDL: 87 mg/dL (ref >40)
LDL CALC: 103 mg/dL — AB (ref 0–99)
TRIGLYCERIDES: 36 mg/dL (ref <150)
VLDL: 7 mg/dL (ref 0–40)

## 2016-03-20 MED ORDER — SODIUM CHLORIDE 0.9 % IV SOLN
INTRAVENOUS | Status: AC
  Administered 2016-03-20: 06:00:00 via INTRAVENOUS

## 2016-03-20 MED ORDER — ENOXAPARIN SODIUM 40 MG/0.4ML ~~LOC~~ SOLN
40.0000 mg | SUBCUTANEOUS | Status: DC
  Administered 2016-03-20 – 2016-03-21 (×2): 40 mg via SUBCUTANEOUS
  Filled 2016-03-20 (×2): qty 0.4

## 2016-03-20 MED ORDER — PRAVASTATIN SODIUM 20 MG PO TABS: 20.0000 mg | ORAL_TABLET | ORAL | Status: DC

## 2016-03-20 MED ORDER — AMLODIPINE BESYLATE 5 MG PO TABS
5.0000 mg | ORAL_TABLET | Freq: Every day | ORAL | Status: DC
  Administered 2016-03-20 – 2016-03-21 (×2): 5 mg via ORAL
  Filled 2016-03-20 (×2): qty 1

## 2016-03-20 MED ORDER — ACETAMINOPHEN 160 MG/5ML PO SOLN: 650.0000 mg | ORAL | Status: DC | PRN

## 2016-03-20 MED ORDER — LEVOTHYROXINE SODIUM 100 MCG PO TABS
100.0000 ug | ORAL_TABLET | Freq: Every day | ORAL | Status: DC
  Administered 2016-03-20 – 2016-03-21 (×2): 100 ug via ORAL
  Filled 2016-03-20 (×2): qty 1

## 2016-03-20 MED ORDER — ADULT MULTIVITAMIN W/MINERALS CH
1.0000 | ORAL_TABLET | Freq: Every day | ORAL | Status: DC
  Administered 2016-03-20 – 2016-03-21 (×2): 1 via ORAL
  Filled 2016-03-20 (×2): qty 1

## 2016-03-20 MED ORDER — ACETAMINOPHEN 650 MG RE SUPP: 650.0000 mg | RECTAL | Status: DC | PRN

## 2016-03-20 MED ORDER — DEXTROSE 5 % IV SOLN: 1.0000 g | Freq: Once | INTRAVENOUS | Status: DC

## 2016-03-20 MED ORDER — CLOPIDOGREL BISULFATE 75 MG PO TABS
75.0000 mg | ORAL_TABLET | Freq: Every day | ORAL | Status: DC
  Administered 2016-03-20 – 2016-03-21 (×2): 75 mg via ORAL
  Filled 2016-03-20 (×2): qty 1

## 2016-03-20 MED ORDER — DIVALPROEX SODIUM ER 500 MG PO TB24
500.0000 mg | ORAL_TABLET | Freq: Every day | ORAL | Status: DC
  Administered 2016-03-21: 500 mg via ORAL
  Filled 2016-03-20 (×2): qty 1

## 2016-03-20 MED ORDER — DEXTROSE 5 % IV SOLN
1.0000 g | Freq: Every day | INTRAVENOUS | Status: DC
  Administered 2016-03-20 – 2016-03-21 (×2): 1 g via INTRAVENOUS
  Filled 2016-03-20 (×2): qty 10

## 2016-03-20 MED ORDER — PANTOPRAZOLE SODIUM 40 MG PO TBEC
40.0000 mg | DELAYED_RELEASE_TABLET | Freq: Every day | ORAL | Status: DC
  Administered 2016-03-20 – 2016-03-21 (×2): 40 mg via ORAL
  Filled 2016-03-20 (×2): qty 1

## 2016-03-20 MED ORDER — ASPIRIN 325 MG PO TABS: 325.0000 mg | ORAL_TABLET | Freq: Every day | ORAL | Status: DC

## 2016-03-20 MED ORDER — ACETAMINOPHEN 325 MG PO TABS: 650.0000 mg | ORAL_TABLET | ORAL | Status: DC | PRN

## 2016-03-20 MED ORDER — STROKE: EARLY STAGES OF RECOVERY BOOK
Freq: Once | Status: AC
  Administered 2016-03-20: 07:00:00
  Filled 2016-03-20: qty 1

## 2016-03-20 MED ORDER — ASPIRIN 300 MG RE SUPP: 300.0000 mg | Freq: Every day | RECTAL | Status: DC

## 2016-03-20 MED ORDER — PRAVASTATIN SODIUM 40 MG PO TABS: 40.0000 mg | ORAL_TABLET | ORAL | Status: DC

## 2016-03-20 NOTE — H&P (Signed)
History and Physical    Danielle Bryan [id number] DOB: 10-17-1923 DOA: 03/19/2016  PCP: Velna Hatchet, MD  Patient coming from: Home.  Chief Complaint: Difficulty expressing words.  HPI: Danielle Bryan is a 83 y.o. female with history of TIA, hypertension, hyperlipidemia, hypothyroidism was noticed by patient's son to be having difficulty expressing words. This was noticed around 10 PM. As per the patient the symptoms lasted for around 2 hours and resolved. Denies any weakness of the upper or lower extremities or any difficulty swallowing or any visual symptoms. In the ER patient was found to be nonfocal. CT head was showing old infarct. On-call neurologist has been consulted and patient is being admitted for TIA. Patient denies any headache chest pain or shortness of breath.   ED Course: CT head was unremarkable showed old infarct. EKG showed normal sinus rhythm. Neurology was consulted.  Review of Systems: As per HPI, rest all negative.   Past Medical History:  Diagnosis Date  . Hypercholesterolemia   . Hypertension   . Hypothyroidism   . Osteoarthritis   . Reactive depression (situational)   . TIA (transient ischemic attack)    on plavix    Past Surgical History:  Procedure Laterality Date  . CATARACT EXTRACTION Left 1992  . CATARACT EXTRACTION Right 1995  . DEBRIDEMENT AND CLOSURE WOUND       reports that she has never smoked. She has never used smokeless tobacco. She reports that she does not drink alcohol or use drugs.  Allergies  Allergen Reactions  . Escitalopram Oxalate Nausea Only  . Lisinopril Other (See Comments)    Very decreased blood pressure   . Statins     myalgias  . Sulfa Antibiotics     hives    Family History  Problem Relation Age of Onset  . Heart failure Mother   . Pneumonia Mother   . Heart failure Father     Prior to Admission medications   Medication Sig Start Date End Date Taking? Authorizing Provider  acetaminophen (TYLENOL) 325  MG tablet Take 325 mg by mouth every 6 (six) hours as needed for mild pain.  01/09/15  Yes Historical Provider, MD  amLODipine (NORVASC) 5 MG tablet Take 5 mg by mouth daily. 03/18/16  Yes Historical Provider, MD  Calcium Carb-Cholecalciferol (CALCIUM 600/VITAMIN D3) 600-800 MG-UNIT TABS Take 1 tablet by mouth 2 (two) times daily.    Yes Historical Provider, MD  clopidogrel (PLAVIX) 75 MG tablet Take 1 tablet (75 mg total) by mouth daily. 03/21/14  Yes Velna Hatchet, MD  levothyroxine (SYNTHROID, LEVOTHROID) 100 MCG tablet TAKE 1 TABLET (100 MCG TOTAL) BY MOUTH DAILY. TAKING 6 DAYS A WEEK. DON'T TAKE ON SUNDAYS   Yes Darlin Coco, MD  Multiple Vitamin (MULTIVITAMIN WITH MINERALS) TABS tablet Take 1 tablet by mouth daily.   Yes Historical Provider, MD  pantoprazole (PROTONIX) 40 MG tablet Take 40 mg by mouth daily.   Yes Historical Provider, MD  Pitavastatin Calcium (LIVALO) 2 MG TABS Take 2 mg by mouth once a week. Mondays   Yes Historical Provider, MD    Physical Exam: Vitals:   03/20/16 0130 03/20/16 0242 03/20/16 0330 03/20/16 0428  BP: 157/78 143/82 145/79   Pulse: 107 93 95   Resp: 15 11 12    Temp:    98.5 F (36.9 C)  TempSrc:      SpO2: 100% 100% 99%   Weight:      Height:  Constitutional: Moderately built and nourished. Vitals:   03/20/16 0130 03/20/16 0242 03/20/16 0330 03/20/16 0428  BP: 157/78 143/82 145/79   Pulse: 107 93 95   Resp: 15 11 12    Temp:    98.5 F (36.9 C)  TempSrc:      SpO2: 100% 100% 99%   Weight:      Height:       Eyes: Anicteric no pallor. ENMT: No discharge from the ears eyes nose or mouth. Neck: No mass felt. No neck rigidity. Respiratory: No rhonchi or crepitations. Cardiovascular: S1-S2 heard no murmurs appreciated. Abdomen: Soft nontender bowel sounds present. No guarding or rigidity. Musculoskeletal: No edema. No joint effusion. Skin: No rash. Skin appears warm. Neurologic: Alert awake oriented to time place and person.  Moves all extremities 5 x 5. No facial asymmetry. Tongue is midline. Pupils are equal and reacting. Psychiatric: Appears normal. Normal affect.   Labs on Admission: I have personally reviewed following labs and imaging studies  CBC:  Recent Labs Lab 03/20/16 0031  WBC 7.7  NEUTROABS 4.6  HGB 12.4  HCT 37.5  MCV 89.5  PLT 0000000   Basic Metabolic Panel:  Recent Labs Lab 03/20/16 0031  NA 133*  K 3.9  CL 99*  CO2 22  GLUCOSE 131*  BUN 22*  CREATININE 0.88  CALCIUM 9.6   GFR: Estimated Creatinine Clearance: 34.4 mL/min (by C-G formula based on SCr of 0.88 mg/dL). Liver Function Tests:  Recent Labs Lab 03/20/16 0031  AST 22  ALT 12*  ALKPHOS 72  BILITOT 0.5  PROT 6.9  ALBUMIN 4.2   No results for input(s): LIPASE, AMYLASE in the last 168 hours. No results for input(s): AMMONIA in the last 168 hours. Coagulation Profile: No results for input(s): INR, PROTIME in the last 168 hours. Cardiac Enzymes: No results for input(s): CKTOTAL, CKMB, CKMBINDEX, TROPONINI in the last 168 hours. BNP (last 3 results) No results for input(s): PROBNP in the last 8760 hours. HbA1C: No results for input(s): HGBA1C in the last 72 hours. CBG: No results for input(s): GLUCAP in the last 168 hours. Lipid Profile: No results for input(s): CHOL, HDL, LDLCALC, TRIG, CHOLHDL, LDLDIRECT in the last 72 hours. Thyroid Function Tests: No results for input(s): TSH, T4TOTAL, FREET4, T3FREE, THYROIDAB in the last 72 hours. Anemia Panel: No results for input(s): VITAMINB12, FOLATE, FERRITIN, TIBC, IRON, RETICCTPCT in the last 72 hours. Urine analysis:    Component Value Date/Time   COLORURINE STRAW (A) 03/20/2016 0120   APPEARANCEUR CLEAR 03/20/2016 0120   LABSPEC 1.008 03/20/2016 0120   PHURINE 5.0 03/20/2016 0120   GLUCOSEU NEGATIVE 03/20/2016 0120   HGBUR SMALL (A) 03/20/2016 0120   BILIRUBINUR NEGATIVE 03/20/2016 0120   KETONESUR NEGATIVE 03/20/2016 0120   PROTEINUR 30 (A)  03/20/2016 0120   UROBILINOGEN 0.2 03/21/2014 1209   NITRITE NEGATIVE 03/20/2016 0120   LEUKOCYTESUR MODERATE (A) 03/20/2016 0120   Sepsis Labs: @LABRCNTIP (procalcitonin:4,lacticidven:4) )No results found for this or any previous visit (from the past 240 hour(s)).   Radiological Exams on Admission: Ct Head Wo Contrast  Result Date: 03/20/2016 CLINICAL DATA:  Initial evaluation for aphasia that is improving. EXAM: CT HEAD WITHOUT CONTRAST TECHNIQUE: Contiguous axial images were obtained from the base of the skull through the vertex without intravenous contrast. COMPARISON:  Prior CT from 03/18/2015. FINDINGS: Brain: Generalized age-related cerebral atrophy with moderate chronic microvascular ischemic disease, similar to previous. Microvascular changes present within the pons as well. Small focus of encephalomalacia within the right frontal  operculum compatible with a small remote right MCA territory infarct, also stable from previous. No acute intracranial hemorrhage identified. No evidence for acute large vessel territory infarct. No mass lesion, midline shift or mass effect. No hydrocephalus. No extra-axial fluid collection. Vascular: Prominent vascular calcifications present within the carotid siphons. No hyperdense vessel. Skull: Scalp soft tissues demonstrate no acute abnormality. Calvarium intact. Sinuses/Orbits: Globes and orbital soft tissues within normal limits. Patient is status post lens extraction bilaterally. Paranasal sinuses and mastoid air cells are clear. IMPRESSION: 1. No acute intracranial infarct or other process identified. 2. Small remote right MCA territory infarct, stable from previous. 3. Stable atrophy with moderate chronic microvascular ischemic disease. Electronically Signed   By: Jeannine Boga M.D.   On: 03/20/2016 02:19    EKG: Independently reviewed. Normal sinus rhythm.  Assessment/Plan Principal Problem:   TIA (transient ischemic attack) Active Problems:    Hypothyroidism   Hypercholesterolemia   Aphasia   HTN (hypertension)    1. TIA - patient's symptoms have resolved by this time. Neurology has been consulted. Patient is placed on aspirin. Follow neurology recommendations. I have placed patient on neuro checks patient has passed swallow. Check MRI/MRA brain 2-D echocardiogram Carotid Doppler hemoglobin A1c and lipid panel. 2. Possible UTI - follow urine cultures. Patient is on ceftriaxone. 3. Hypertension on amlodipine. Allow for permissive hypertension. 4. Hypothyroidism on Synthroid. 5. Hyperlipidemia on statins.   DVT prophylaxis: Lovenox. Code Status: Full code.  Family Communication: Discussed with patient.  Disposition Plan: Home.  Consults called: Neurology.  Admission status: Observation.    Rise Patience MD [facility] Pager [phone].  If 7PM-7AM, please contact night-coverage [email or web address] Password [facility]  03/20/2016, 5:26 AM

## 2016-03-20 NOTE — Evaluation (Signed)
Physical Therapy Evaluation Patient Details Name: Danielle Bryan MRN: [id number] DOB: [birth year] Today's Date: 03/20/2016   History of Present Illness  83 y.o. female with history of TIA, hypertension, hyperlipidemia, hypothyroidism was noticed by patient's son to be having difficulty expressing words. MRI on 1/25 negative for acute infarct.   Clinical Impression  Pt is close to baseline functioning and should be safe at home with limited assist in a home-like envirmonment. There are no further acute PT needs.  Will sign off at this time.     Follow Up Recommendations No PT follow up;Supervision - Intermittent    Equipment Recommendations  None recommended by PT    Recommendations for Other Services       Precautions / Restrictions Precautions Precautions: Fall Restrictions Weight Bearing Restrictions: No      Mobility  Bed Mobility Overal bed mobility: Modified Independent             General bed mobility comments: HOB flat with minimal use of bed rail. Increased time required.  Transfers Overall transfer level: Needs assistance Equipment used: None Transfers: Sit to/from Stand Sit to Stand: Supervision         General transfer comment: Supervision for safety; no physical assist required.  Ambulation/Gait Ambulation/Gait assistance: Supervision Ambulation Distance (Feet): 160 Feet Assistive device: None Gait Pattern/deviations: Step-through pattern Gait velocity: slower Gait velocity interpretation: at or above normal speed for age/gender General Gait Details: generally steady without AD in a home-like environment.  Pt is likely at mod I level in her own environment  Stairs            Wheelchair Mobility    Modified Rankin (Stroke Patients Only)       Balance Overall balance assessment: Needs assistance         Standing balance support: No upper extremity supported Standing balance-Leahy Scale: Fair Standing balance comment: solid and  steady               High Level Balance Comments: pt had no confidence with R/L scanning, but no deviation.  scanning up and down produced mild instability.  Pt prefers to look down slightly             Pertinent Vitals/Pain Pain Assessment: No/denies pain    Home Living Family/patient expects to be discharged to:: Private residence Living Arrangements: Alone Available Help at Discharge: Family;Available PRN/intermittently Type of Home: House Home Access: Stairs to enter   Entrance Stairs-Number of Steps: 2 Home Layout: One level Home Equipment: Walker - 2 wheels;Cane - single point;Wheelchair - manual      Prior Function Level of Independence: Independent         Comments: Does not get in tub; only sponge bathes at the sink. Does not drive. Son drives pt to appointments and does grocery shopping.     Hand Dominance   Dominant Hand: Right    Extremity/Trunk Assessment   Upper Extremity Assessment Upper Extremity Assessment: Defer to OT evaluation    Lower Extremity Assessment Lower Extremity Assessment: Overall WFL for tasks assessed (proximal weakness bilaterally)    Cervical / Trunk Assessment Cervical / Trunk Assessment: Kyphotic  Communication   Communication: No difficulties  Cognition Arousal/Alertness: Awake/alert Behavior During Therapy: WFL for tasks assessed/performed Overall Cognitive Status: Within Functional Limits for tasks assessed                 General Comments: pt reports she feels extremely fatigued today.    General Comments  Exercises     Assessment/Plan    PT Assessment Patent does not need any further PT services  PT Problem List            PT Treatment Interventions      PT Goals (Current goals can be found in the Care Plan section)  Acute Rehab PT Goals Patient Stated Goal: return home PT Goal Formulation: With patient    Frequency     Barriers to discharge        Co-evaluation                End of Session   Activity Tolerance: Patient tolerated treatment well Patient left: in chair;with call bell/phone within reach Nurse Communication: Mobility status    Functional Assessment Tool Used: clinical judgement Functional Limitation: Mobility: Walking and moving around Mobility: Walking and Moving Around Current Status [id number]): At least 1 percent but less than 20 percent impaired, limited or restricted Mobility: Walking and Moving Around Goal Status [phone]): At least 1 percent but less than 20 percent impaired, limited or restricted Mobility: Walking and Moving Around Discharge Status [phone]): At least 1 percent but less than 20 percent impaired, limited or restricted    Time: 1535-1555 PT Time Calculation (min) (ACUTE ONLY): 20 min   Charges:   PT Evaluation $PT Eval Moderate Complexity: 1 Procedure     PT G Codes:   PT G-Codes **NOT FOR INPATIENT CLASS** Functional Assessment Tool Used: clinical judgement Functional Limitation: Mobility: Walking and moving around Mobility: Walking and Moving Around Current Status [id number]): At least 1 percent but less than 20 percent impaired, limited or restricted Mobility: Walking and Moving Around Goal Status [phone]): At least 1 percent but less than 20 percent impaired, limited or restricted Mobility: Walking and Moving Around Discharge Status [phone]): At least 1 percent but less than 20 percent impaired, limited or restricted    Burnard Bunting 03/20/2016, 6:02 PM 03/20/2016  Donnella Sham, [place] [phone]  (pager)

## 2016-03-20 NOTE — Progress Notes (Signed)
Pt admitted from ED with stroke like symptoms, alert and oriented, denies any pain at this time, pt settled in bed, call light at bed side, tele monitor put and verified on pt, was however reassured and will continue to monitor. [place]Asidi, Caisley [place] Efe

## 2016-03-20 NOTE — ED Notes (Signed)
Pt has been responding much more quickly than when originally brought in.

## 2016-03-20 NOTE — ED Notes (Signed)
Patient transported to CT 

## 2016-03-20 NOTE — Progress Notes (Signed)
  Echocardiogram 2D Echocardiogram has been performed.  Jennette Dubin 03/20/2016, 9:31 AM

## 2016-03-20 NOTE — Care Management Obs Status (Signed)
[place] NOTIFICATION   Patient Details  Name: Danielle Bryan MRN: [id number] Date of Birth: Oct 12, 1923   Medicare Observation Status Notification Given:  Yes    Pollie Friar, RN 03/20/2016, 3:46 PM

## 2016-03-20 NOTE — Progress Notes (Signed)
STROKE TEAM PROGRESS NOTE   HISTORY OF PRESENT ILLNESS (per record) Danielle Bryan is an 83 y.o. female with a history of hypertension, hyperlipidemia, hypothyroidism and TIA, brought to the ED following onset of speech output difficulty. Patient knew what she wanted to say, but had difficulty getting the words out. Speech was also slurred. Symptoms were similar to those experienced with her previous TIA. She did not experience weakness nor numbness involving extremities. CT scan of her head showed no acute findings. Old right MCA territory infarction was noted. NIH stroke score at the time of this evaluation was 0. Patient's speech difficulty resolved after arriving in the ED.  LSN: 6:00 PM on 03/19/2016 tPA Given: No: Deficits rapidly resolved mRankin:   SUBJECTIVE (INTERVAL HISTORY) Her son is at the bedside.  Overall she feels her condition is completely resolved. .Interestingly she has similar episodes 2 years ago for which she was seen by me. Brain imaging was negative but EEG had shown some focal slowing on the left. It was unclear whether the episode represented TIA versus focal seizure. She was placed on Keppra. She was also found to have UTI at that time. She did well for a year and was seen in the office with Dr. Jaynee Eagles and  was taken off the [place]. Patient has done well until recently when she had is similar episode of transient expressive word finding difficulties. Brain MRI this time as well does not show an acute infarct. Interestingly this time as well her UA is abnormal but urine culture is yet pending.   OBJECTIVE Temp:  [98 F (36.7 C)-98.5 F (36.9 C)] 98 F (36.7 C) (01/25 0515) Pulse Rate:  [84-108] 84 (01/25 0600) Cardiac Rhythm: Sinus tachycardia (01/25 0518) Resp:  [11-19] 18 (01/25 0600) BP: (142-182)/(57-82) 142/57 (01/25 0600) SpO2:  [99 %-100 %] 99 % (01/25 0600) Weight:  [59.4 kg (131 lb)-64.7 kg (142 lb 11.2 oz)] 64.7 kg (142 lb 11.2 oz) (01/25 0515)  CBC:   Recent Labs Lab 03/20/16 0031 03/20/16 0608  WBC 7.7 6.4  NEUTROABS 4.6  --   HGB 12.4 12.0  HCT 37.5 37.0  MCV 89.5 90.2  PLT 175 0000000    Basic Metabolic Panel:  Recent Labs Lab 03/20/16 0031 03/20/16 0608  NA 133* 138  K 3.9 4.0  CL 99* 105  CO2 22 24  GLUCOSE 131* 123*  BUN 22* 17  CREATININE 0.88 0.84  CALCIUM 9.6 9.6    Lipid Panel:    Component Value Date/Time   CHOL 197 03/20/2016 0608   TRIG 36 03/20/2016 0608   HDL 87 03/20/2016 0608   CHOLHDL 2.3 03/20/2016 0608   VLDL 7 03/20/2016 0608   LDLCALC 103 (H) 03/20/2016 0608   HgbA1c:  Lab Results  Component Value Date   HGBA1C 5.6 03/18/2015   Urine Drug Screen: No results found for: LABOPIA, COCAINSCRNUR, LABBENZ, AMPHETMU, THCU, LABBARB    IMAGING  Ct Head Wo Contrast 03/20/2016 1. No acute intracranial infarct or other process identified.  2. Small remote right MCA territory infarct, stable from previous.  3. Stable atrophy with moderate chronic microvascular ischemic disease.     MRI / MRA Head - pending    PHYSICAL EXAM Frail pleasant elderly caucasian lady not in distress. . Afebrile. Head is nontraumatic. Neck is supple without bruit.    Cardiac exam no murmur or gallop. Lungs are clear to auscultation. Distal pulses are well felt.  Neurological Exam ;  Awake  Alert oriented x  3. Normal speech and language.no word hesitancy. Able to name, repeat and comprehend quite well. Diminished attention, registration and recall. eye movements full without nystagmus.fundi were not visualized. Vision acuity and fields appear normal. Hearing is normal. Palatal movements are normal. Face symmetric. Tongue midline. Normal strength, tone, reflexes and coordination. Normal sensation. Gait deferred.      ASSESSMENT/PLAN Danielle Bryan is a 83 y.o. female with history of previous stroke, TIAs, hypothyroidism, hypertension, and hyperlipidemia presenting with speech difficulties. She did not receive IV  t-PA due to improvement in deficits.  Stroke / TIA: Dominant - possibly embolic from an unknown source.  Resultant  no deficits  MRI - no acute infarct. Old right MCA branch and bilateral basal ganglia lacunar infarcts   MRA - advanced intracranial atherosclerotic changes involving small to medium size vessels. Mild distal basilar stenosis and left vertebral distal occlusion  CT - Small remote right MCA territory infarct,  Carotid Doppler - 1-39% ICA plaquing. Vertebral artery flow is antegrade.  2D Echo - Left ventricle: The cavity size was normal. Wall thickness was   increased in a pattern of mild LVH. Systolic function was   vigorous. The estimated ejection fraction was in the range of 65%   to 70%. Wall motion was normal; there were no regional wall    motion abnormalitiesLDL - 103  HgbA1c - pending  VTE prophylaxis - Lovenox  Diet Heart Room service appropriate? Yes; Fluid consistency: Thin  clopidogrel 75 mg daily prior to admission, now on clopidogrel 75 mg daily  Patient counseled to be compliant with her antithrombotic medications  Ongoing aggressive stroke risk factor management  Therapy recommendations: pending  Disposition:  Pending  Hypertension  Stable  Permissive hypertension (OK if < 220/120) but gradually normalize in 5-7 days  Long-term BP goal normotensive  Hyperlipidemia  Home meds: No lipid lowering medications prior to admission  LDL 103, goal < 70  Intolerant to statin secondary to myalgias  Continue statin at discharge   Other Stroke Risk Factors  Advanced age  Overweight, Body mass index is 26.96 kg/m., recommend weight loss, diet and exercise as appropriate   Hx stroke/TIA    Other Active Problems  UTI  Hospital day # 0  I have personally examined this patient, reviewed notes, independently viewed imaging studies, participated in medical decision making and plan of care.ROS completed by me personally and pertinent  positives fully documented  I have made any additions or clarifications directly to the above note. Patient has presented with transient episode of expressive language difficulties with negative brain imaging study. She had similar episode a few years ago with negative workup as well but EEG had shown some focal abnormalities on the left. She was placed on Keppra for a short while but it was discontinued. Recommend repeat EEG study and start Depakote for seizure prophylaxis. Continue Plavix for stroke prevention. Consider increasing home dose of Pitavastatin to 2 mg twice a week to him for LDL cholesterol below 70 mg percent. I had a long discussion the patient and his son and answered questions. Discussed with Dr. Grandville Silos. Greater than 50% time during this 35 minute visit was spent on counseling and coordination of care about her speech difficulties, TIA, seizure, evaluation plan and treatment discussion  Antony Contras, MD Medical Director [place] Pager: [phone] 03/20/2016 1:44 PM   To contact Stroke Continuity provider, please refer to [email or web address] After hours, contact General Neurology

## 2016-03-20 NOTE — ED Provider Notes (Signed)
By signing my name below, I, Dolores Hoose, attest that this documentation has been prepared under the direction and in the presence of [place], DO . Electronically Signed: Dolores Hoose, Scribe. 03/20/2016. 1:15 AM.  TIME SEEN: 1:26AM  CHIEF COMPLAINT: Aphasia  HPI:  Danielle Bryan is a 83 y.o. female with pmhx of HTN who presents to the Emergency Department by ambulance complaining of sudden-onset, gradually resolving speech difficulty which was first noticed by her son around 5:30 pm when talking to her on the phone. Pt's son notes that the last time he saw her normal was around noon yesterday and she was normal. He recalls that she was having trouble finishing her thoughts and she had to stop and think about her words and so he called EMS. No modifying factors indicated. Pt reports associated dull headache. Pt is compliant all of her daily medications, including plavix. She denies any CP, SOB, fever, cough, vomiting or diarrhea. Pt also denies any recent head injury. No numbness, tingling or focal weakness. No vision or hearing changes.  ROS: See HPI Constitutional: no fever  Eyes: no drainage  ENT: no runny nose   Cardiovascular:  no chest pain  Resp: no SOB  GI: no vomiting GU: no dysuria Integumentary: no rash  Allergy: no hives  Musculoskeletal: no leg swelling  Neurological: speech difficulty. headache. ROS otherwise negative  PAST MEDICAL HISTORY/PAST SURGICAL HISTORY:  Past Medical History:  Diagnosis Date  . Hypercholesterolemia   . Hypertension   . Hypothyroidism   . Osteoarthritis   . Reactive depression (situational)   . TIA (transient ischemic attack)    on plavix    MEDICATIONS:  Prior to Admission medications   Medication Sig Start Date End Date Taking? Authorizing Provider  acetaminophen (TYLENOL) 325 MG tablet Take 325 mg by mouth every 6 (six) hours as needed for mild pain.  01/09/15   Historical Provider, MD  Calcium Carb-Cholecalciferol (CALCIUM  600/VITAMIN D3) 600-800 MG-UNIT TABS Take 1 tablet by mouth 2 (two) times daily.     Historical Provider, MD  cetaphil (CETAPHIL) cream Apply 1 application topically 2 (two) times daily as needed (dryness).    Historical Provider, MD  Cholecalciferol (VITAMIN D PO) Take 1,000 Units by mouth daily. 12/05/14   Historical Provider, MD  clopidogrel (PLAVIX) 75 MG tablet Take 1 tablet (75 mg total) by mouth daily. 03/21/14   Velna Hatchet, MD  levothyroxine (SYNTHROID, LEVOTHROID) 100 MCG tablet TAKE 1 TABLET (100 MCG TOTAL) BY MOUTH DAILY. TAKING 6 DAYS A WEEK. DON'T TAKE ON SUNDAYS    Darlin Coco, MD  Multiple Vitamins-Minerals (CENTRUM PO) Take 1 tablet by mouth daily.    Historical Provider, MD  pantoprazole (PROTONIX) 40 MG tablet Take 40 mg by mouth daily.    Historical Provider, MD  Pitavastatin Calcium (LIVALO) 2 MG TABS Take 2 mg by mouth once a week. Mondays    Historical Provider, MD    ALLERGIES:  Allergies  Allergen Reactions  . Escitalopram Oxalate Nausea Only  . Lisinopril Other (See Comments)    Very decreased blood pressure   . Statins     myalgias  . Sulfa Antibiotics     hives    SOCIAL HISTORY:  Social History  Substance Use Topics  . Smoking status: Never Smoker  . Smokeless tobacco: Never Used  . Alcohol use No    FAMILY HISTORY: Family History  Problem Relation Age of Onset  . Heart failure Mother   . Pneumonia Mother   .  Heart failure Father     EXAM: BP 180/68 (BP Location: Left Arm)   Pulse 105   Temp 98 F (36.7 C) (Oral)   Resp 13   Ht 5\' 1"  (1.549 m)   Wt 131 lb (59.4 kg)   SpO2 100%   BMI 24.75 kg/m  CONSTITUTIONAL:Elderly;  Alert and oriented x 3 and responds appropriately to questions. Well-appearing; well-nourished HEAD: Normocephalic EYES: Conjunctivae clear, PERRL, EOMI ENT: normal nose; no rhinorrhea; moist mucous membranes NECK: Supple, no meningismus, no nuchal rigidity, no LAD  CARD: RRR; S1 and S2 appreciated; no murmurs,  no clicks, no rubs, no gallops RESP: Normal chest excursion without splinting or tachypnea; breath sounds clear and equal bilaterally; no wheezes, no rhonchi, no rales, no hypoxia or respiratory distress, speaking full sentences ABD/GI: Normal bowel sounds; non-distended; soft, non-tender, no rebound, no guarding, no peritoneal signs, no hepatosplenomegaly BACK:  The back appears normal and is non-tender to palpation, there is no CVA tenderness EXT: Normal ROM in all joints; non-tender to palpation; no edema; normal capillary refill; no cyanosis, no calf tenderness or swelling    SKIN: Normal color for age and race; warm; no rash NEURO: Moves all extremities equally, sensation to light touch intact diffusely, cranial nerves II through XII intact, normal speech; Slow to answer questions PSYCH: The patient's mood and manner are appropriate. Grooming and personal hygiene are appropriate.  MEDICAL DECISION MAKING: Patient here with mild aphasia that has improved. Not completely resolved. Son reports similar symptoms with her prior urinary tract infection. Labs obtained in triage are unremarkable. Urine pending. Will obtain head CT. NIH stroke scale is currently 0. She would not be a TPA candidate given symptoms are improving.  ED PROGRESS: Urine shows blood and leukocytes but no bacteria. I feel urinary tract infection less likely the cause of her symptoms. Urine culture will be sent but I will hold on antibiotics. Head CT shows no acute abnormality but does show remote infarct. We'll discuss with neurology.  2:54 AM Spoke with Dr. Nicole Kindred who agrees with admission to medicine for TIA work-up.  Neurology will see patient in consult. Appreciate his help.    3:25 AM  Discussed patient's case with hospitalist, Dr. Hal Hope.  Recommended admission to telemetry, observation bed.  I will place holding orders per their request. Patient and family (if present) updated with plan. Care transferred to  hospitalist service.  I reviewed all nursing notes, vitals, pertinent old records, EKGs, labs, imaging (as available).    EKG Interpretation  Date/Time:  Thursday March 20 2016 00:06:08 EST Ventricular Rate:  103 PR Interval:    QRS Duration: 86 QT Interval:  338 QTC Calculation: 443 R Axis:   121 Text Interpretation:  Sinus tachycardia Borderline prolonged PR interval Right axis deviation No significant change since last tracing Confirmed by Gagandeep Kossman,  DO, Meena Barrantes [phone]) on 03/20/2016 12:23:22 AM       I personally performed the services described in this documentation, which was scribed in my presence. The recorded information has been reviewed and is accurate.    [place], DO 03/20/16 [phone]

## 2016-03-20 NOTE — Care Management Note (Signed)
Case Management Note  Patient Details  Name: Danielle Bryan MRN: [id number] Date of Birth: 1923/04/25  Subjective/Objective:       Patient presented with Difficulty expressing words. Lives at home. CM will follow for discharge needs pending PT/OT evals and physician orders.              Action/Plan:   Expected Discharge Date:                  Expected Discharge Plan:     In-House Referral:     Discharge planning Services     Post Acute Care Choice:    Choice offered to:     DME Arranged:    DME Agency:     HH Arranged:    HH Agency:     Status of Service:     If discussed at [employer] of Stay Meetings, dates discussed:    Additional Comments:  Rolm Baptise, RN 03/20/2016, 10:29 AM

## 2016-03-20 NOTE — Progress Notes (Signed)
VASCULAR LAB PRELIMINARY  PRELIMINARY  PRELIMINARY  PRELIMINARY  Carotid duplex completed.    Preliminary report:  1-39% ICA plaquing. Vertebral artery flow is antegrade.   Amiliah Campisi, RVT 03/20/2016, 9:37 AM

## 2016-03-20 NOTE — Progress Notes (Signed)
EEG Completed; Results Pending  

## 2016-03-20 NOTE — Evaluation (Signed)
Occupational Therapy Evaluation and Discharge Patient Details Name: Danielle Bryan MRN: [id number] DOB: 12/14/1923 Today's Date: 03/20/2016    History of Present Illness 83 y.o. female with history of TIA, hypertension, hyperlipidemia, hypothyroidism was noticed by patient's son to be having difficulty expressing words. MRI on 1/25 negative for acute infarct.    Clinical Impression   Pt reports she was independent with BADL PTA. Currently pt overall supervision for ADL and functional mobility; would be safe for household mobility and tasks at mod I level. Pt reporting fatigue but no residual symptoms; feels she has returned to baseline at this time. Pt planning to d/c home alone with intermittent supervision from her son. No further acute OT needs identified; signing off at this time. Please re-consult if needs change. Thank you for this referral.    Follow Up Recommendations  No OT follow up;Supervision - Intermittent    Equipment Recommendations  None recommended by OT    Recommendations for Other Services       Precautions / Restrictions Precautions Precautions: Fall Restrictions Weight Bearing Restrictions: No      Mobility Bed Mobility Overal bed mobility: Modified Independent             General bed mobility comments: HOB flat with minimal use of bed rail. Increased time required.  Transfers Overall transfer level: Needs assistance Equipment used: None Transfers: Sit to/from Stand Sit to Stand: Supervision         General transfer comment: Supervision for safety; no physical assist required.    Balance Overall balance assessment: No apparent balance deficits (not formally assessed)                                          ADL Overall ADL's : Needs assistance/impaired Eating/Feeding: Modified independent;Sitting   Grooming: Supervision/safety;Standing;Wash/dry hands   Upper Body Bathing: Set up;Sitting   Lower Body Bathing:  Supervison/ safety;Sit to/from stand   Upper Body Dressing : Set up;Sitting   Lower Body Dressing: Supervision/safety;Sit to/from stand   Toilet Transfer: Supervision/safety;Ambulation;Regular Toilet   Toileting- Water quality scientist and Hygiene: Supervision/safety       Functional mobility during ADLs: Supervision/safety General ADL Comments: Pt reports all symptoms have resolved at this time.     Vision Vision Assessment?: No apparent visual deficits   Perception     Praxis      Pertinent Vitals/Pain Pain Assessment: No/denies pain     Hand Dominance Right   Extremity/Trunk Assessment Upper Extremity Assessment Upper Extremity Assessment: Generalized weakness   Lower Extremity Assessment Lower Extremity Assessment: Defer to PT evaluation   Cervical / Trunk Assessment Cervical / Trunk Assessment: Kyphotic   Communication Communication Communication: No difficulties   Cognition Arousal/Alertness: Awake/alert Behavior During Therapy: WFL for tasks assessed/performed Overall Cognitive Status: Within Functional Limits for tasks assessed                 General Comments: pt reports she feels extremely fatigued today.   General Comments       Exercises       Shoulder Instructions      Home Living Family/patient expects to be discharged to:: Private residence Living Arrangements: Alone Available Help at Discharge: Family;Available PRN/intermittently Type of Home: House Home Access: Stairs to enter [employer] of Steps: 2   Home Layout: One level     Bathroom Shower/Tub: Tub/shower unit   [employer]  Toilet: Standard     Home Equipment: Walker - 2 wheels;Cane - single point;Wheelchair - manual          Prior Functioning/Environment Level of Independence: Independent        Comments: Does not get in tub; only sponge bathes at the sink. Does not drive. Son drives pt to appointments and does grocery shopping.        OT  Problem List:     OT Treatment/Interventions:      OT Goals(Current goals can be found in the care plan section) Acute Rehab OT Goals Patient Stated Goal: return home OT Goal Formulation: All assessment and education complete, DC therapy  OT Frequency:     Barriers to D/C:            Co-evaluation              End of Session Nurse Communication: Mobility status  Activity Tolerance: Patient tolerated treatment well Patient left: in bed;with call bell/phone within reach;with bed alarm set   Time: [id number] ([place] with PT) OT Time Calculation (min): 21 min Charges:  OT General Charges $OT Visit: 1 Procedure OT Evaluation $OT Eval Moderate Complexity: 1 Procedure G-Codes: OT G-codes **NOT FOR INPATIENT CLASS** Functional Assessment Tool Used: Clinical judgement Functional Limitation: Self care Self Care Current Status [id number]): At least 1 percent but less than 20 percent impaired, limited or restricted Self Care Goal Status [id number]): At least 1 percent but less than 20 percent impaired, limited or restricted Self Care Discharge Status [phone]): At least 1 percent but less than 20 percent impaired, limited or restricted   Binnie Kand M.S., OTR/L Pager: [phone]  03/20/2016, 3:50 PM

## 2016-03-20 NOTE — Progress Notes (Signed)
I have seen and assessed patient and agree with Dr Moise Boring assessment and plan. Patient is a 83 year old female history of TIA, hypertension, hyperlipidemia, hypothyroidism presented to the ED with an expressive aphasia which lasted 2 hours and subsequently resolved. Head CT negative. Consent for probable TIA and a such stroke workup underway. Neurology following. EEG ordered per neurology.

## 2016-03-20 NOTE — Consult Note (Signed)
Admission H&P    Chief Complaint: Transient difficulty with speech output.  HPI: Danielle Bryan is an 83 y.o. female with a history of hypertension, hyperlipidemia, hypothyroidism and TIA, brought to the ED following onset of speech output difficulty. Patient knew what she wanted to say, but had difficulty getting the words out. Speech was also slurred. Symptoms were similar to those experienced with her previous TIA. She did not experience weakness nor numbness involving extremities. CT scan of her head showed no acute findings. Old right MCA territory infarction was noted. NIH stroke score at the time of this evaluation was 0. Patient's speech difficulty resolved after arriving in the ED.  LSN: 6:00 PM on 03/19/2016 tPA Given: No: Deficits rapidly resolved mRankin:  Past Medical History:  Diagnosis Date  . Hypercholesterolemia   . Hypertension   . Hypothyroidism   . Osteoarthritis   . Reactive depression (situational)   . TIA (transient ischemic attack)    on plavix    Past Surgical History:  Procedure Laterality Date  . CATARACT EXTRACTION Left 1992  . CATARACT EXTRACTION Right 1995  . DEBRIDEMENT AND CLOSURE WOUND      Family History  Problem Relation Age of Onset  . Heart failure Mother   . Pneumonia Mother   . Heart failure Father    Social History:  reports that she has never smoked. She has never used smokeless tobacco. She reports that she does not drink alcohol or use drugs.  Allergies:  Allergies  Allergen Reactions  . Escitalopram Oxalate Nausea Only  . Lisinopril Other (See Comments)    Very decreased blood pressure   . Statins     myalgias  . Sulfa Antibiotics     hives    Medications Prior to Admission  Medication Sig Dispense Refill  . acetaminophen (TYLENOL) 325 MG tablet Take 325 mg by mouth every 6 (six) hours as needed for mild pain.     Marland Kitchen amLODipine (NORVASC) 5 MG tablet Take 5 mg by mouth daily.    . Calcium Carb-Cholecalciferol (CALCIUM  600/VITAMIN D3) 600-800 MG-UNIT TABS Take 1 tablet by mouth 2 (two) times daily.     . clopidogrel (PLAVIX) 75 MG tablet Take 1 tablet (75 mg total) by mouth daily. 30 tablet 3  . levothyroxine (SYNTHROID, LEVOTHROID) 100 MCG tablet TAKE 1 TABLET (100 MCG TOTAL) BY MOUTH DAILY. TAKING 6 DAYS A WEEK. DON'T TAKE ON SUNDAYS 90 tablet 0  . Multiple Vitamin (MULTIVITAMIN WITH MINERALS) TABS tablet Take 1 tablet by mouth daily.    . pantoprazole (PROTONIX) 40 MG tablet Take 40 mg by mouth daily.    . Pitavastatin Calcium (LIVALO) 2 MG TABS Take 2 mg by mouth once a week. Mondays      ROS: History obtained from the patient  General ROS: negative for - chills, fatigue, fever, night sweats, weight gain or weight loss Psychological ROS: negative for - behavioral disorder, hallucinations, memory difficulties, mood swings or suicidal ideation Ophthalmic ROS: negative for - blurry vision, double vision, eye pain or loss of vision ENT ROS: negative for - epistaxis, nasal discharge, oral lesions, sore throat, tinnitus or vertigo Allergy and Immunology ROS: negative for - hives or itchy/watery eyes Hematological and Lymphatic ROS: negative for - bleeding problems, bruising or swollen lymph nodes Endocrine ROS: negative for - galactorrhea, hair pattern changes, polydipsia/polyuria or temperature intolerance Respiratory ROS: negative for - cough, hemoptysis, shortness of breath or wheezing Cardiovascular ROS: negative for - chest pain, dyspnea on exertion,  edema or irregular heartbeat Gastrointestinal ROS: negative for - abdominal pain, diarrhea, hematemesis, nausea/vomiting or stool incontinence Genito-Urinary ROS: negative for - dysuria, hematuria, incontinence or urinary frequency/urgency Musculoskeletal ROS: negative for - joint swelling or muscular weakness Neurological ROS: as noted in HPI Dermatological ROS: negative for rash and skin lesion changes  Physical Examination: Blood pressure 145/79, pulse  95, temperature 98.5 F (36.9 C), resp. rate 12, height '5\' 1"'$  (1.549 m), weight 59.4 kg (131 lb), SpO2 99 %.  HEENT-  Normocephalic, no lesions, without obvious abnormality.  Normal external eye and conjunctiva.  Normal TM's bilaterally.  Normal auditory canals and external ears. Normal external nose, mucus membranes and septum.  Normal pharynx. Neck supple with no masses, nodes, nodules or enlargement. Cardiovascular - regular rate and rhythm, S1, S2 normal, no murmur, click, rub or gallop Lungs - chest clear, no wheezing, rales, normal symmetric air entry Abdomen - soft, non-tender; bowel sounds normal; no masses,  no organomegaly Extremities - no joint deformities, effusion, or inflammation  Neurologic Examination: Mental Status: Alert, oriented, thought content appropriate.  Speech fluent without evidence of aphasia. Able to follow commands without difficulty. Cranial Nerves: II-Visual fields were normal. III/IV/VI-Pupils were equal and reacted normally to light. Extraocular movements were full and conjugate.    V/VII-no facial numbness and no facial weakness. VIII-normal. X-normal speech and symmetrical palatal movement. XI: trapezius strength/neck flexion strength normal bilaterally XII-midline tongue extension with normal strength. Motor: 5/5 bilaterally with normal tone and bulk Sensory: Normal throughout. Deep Tendon Reflexes: Trace to 1+ and symmetric. Plantars: Flexor bilaterally Cerebellar: Normal finger-to-nose testing. Carotid auscultation: Normal  Results for orders placed or performed during the hospital encounter of 03/19/16 (from the past 48 hour(s))  CBC with Differential     Status: None   Collection Time: 03/20/16 12:31 AM  Result Value Ref Range   WBC 7.7 4.0 - 10.5 K/uL   RBC 4.19 3.87 - 5.11 MIL/uL   Hemoglobin 12.4 12.0 - 15.0 g/dL   HCT 37.5 36.0 - 46.0 %   MCV 89.5 78.0 - 100.0 fL   MCH 29.6 26.0 - 34.0 pg   MCHC 33.1 30.0 - 36.0 g/dL   RDW 13.6 11.5  - 15.5 %   Platelets 175 150 - 400 K/uL   Neutrophils Relative % 59 %   Neutro Abs 4.6 1.7 - 7.7 K/uL   Lymphocytes Relative 26 %   Lymphs Abs 2.0 0.7 - 4.0 K/uL   Monocytes Relative 13 %   Monocytes Absolute 1.0 0.1 - 1.0 K/uL   Eosinophils Relative 2 %   Eosinophils Absolute 0.1 0.0 - 0.7 K/uL   Basophils Relative 0 %   Basophils Absolute 0.0 0.0 - 0.1 K/uL  Comprehensive metabolic panel     Status: Abnormal   Collection Time: 03/20/16 12:31 AM  Result Value Ref Range   Sodium 133 (L) 135 - 145 mmol/L   Potassium 3.9 3.5 - 5.1 mmol/L   Chloride 99 (L) 101 - 111 mmol/L   CO2 22 22 - 32 mmol/L   Glucose, Bld 131 (H) 65 - 99 mg/dL   BUN 22 (H) 6 - 20 mg/dL   Creatinine, Ser 0.88 0.44 - 1.00 mg/dL   Calcium 9.6 8.9 - 10.3 mg/dL   Total Protein 6.9 6.5 - 8.1 g/dL   Albumin 4.2 3.5 - 5.0 g/dL   AST 22 15 - 41 U/L   ALT 12 (L) 14 - 54 U/L   Alkaline Phosphatase 72 38 - 126 U/L  Total Bilirubin 0.5 0.3 - 1.2 mg/dL   GFR calc non Af Amer 56 (L) >60 mL/min   GFR calc Af Amer >60 >60 mL/min    Comment: (NOTE) The eGFR has been calculated using the CKD EPI equation. This calculation has not been validated in all clinical situations. eGFR's persistently <60 mL/min signify possible Chronic Kidney Disease.    Anion gap 12 5 - 15  Urinalysis, Routine w reflex microscopic     Status: Abnormal   Collection Time: 03/20/16  1:20 AM  Result Value Ref Range   Color, Urine STRAW (A) YELLOW   APPearance CLEAR CLEAR   Specific Gravity, Urine 1.008 1.005 - 1.030   pH 5.0 5.0 - 8.0   Glucose, UA NEGATIVE NEGATIVE mg/dL   Hgb urine dipstick SMALL (A) NEGATIVE   Bilirubin Urine NEGATIVE NEGATIVE   Ketones, ur NEGATIVE NEGATIVE mg/dL   Protein, ur 30 (A) NEGATIVE mg/dL   Nitrite NEGATIVE NEGATIVE   Leukocytes, UA MODERATE (A) NEGATIVE   RBC / HPF 0-5 0 - 5 RBC/hpf   WBC, UA 6-30 0 - 5 WBC/hpf   Bacteria, UA NONE SEEN NONE SEEN   Squamous Epithelial / LPF 0-5 (A) NONE SEEN   Mucous  PRESENT    Ct Head Wo Contrast  Result Date: 03/20/2016 CLINICAL DATA:  Initial evaluation for aphasia that is improving. EXAM: CT HEAD WITHOUT CONTRAST TECHNIQUE: Contiguous axial images were obtained from the base of the skull through the vertex without intravenous contrast. COMPARISON:  Prior CT from 03/18/2015. FINDINGS: Brain: Generalized age-related cerebral atrophy with moderate chronic microvascular ischemic disease, similar to previous. Microvascular changes present within the pons as well. Small focus of encephalomalacia within the right frontal operculum compatible with a small remote right MCA territory infarct, also stable from previous. No acute intracranial hemorrhage identified. No evidence for acute large vessel territory infarct. No mass lesion, midline shift or mass effect. No hydrocephalus. No extra-axial fluid collection. Vascular: Prominent vascular calcifications present within the carotid siphons. No hyperdense vessel. Skull: Scalp soft tissues demonstrate no acute abnormality. Calvarium intact. Sinuses/Orbits: Globes and orbital soft tissues within normal limits. Patient is status post lens extraction bilaterally. Paranasal sinuses and mastoid air cells are clear. IMPRESSION: 1. No acute intracranial infarct or other process identified. 2. Small remote right MCA territory infarct, stable from previous. 3. Stable atrophy with moderate chronic microvascular ischemic disease. Electronically Signed   By: Jeannine Boga M.D.   On: 03/20/2016 02:19    Assessment: 83 y.o. female with multiple risk factors for stroke as well as previous stroke, presenting with probable left MCA territory TIA. Recurrent acute ischemic infarction cannot be ruled out at this point, however.  Stroke Risk Factors - family history, hyperlipidemia and hypertension  Plan: 1. HgbA1c, fasting lipid panel 2. MRI, MRA  of the brain without contrast 3. Speech consult 4. Echocardiogram 5. Carotid  dopplers 6. Prophylactic therapy-Antiplatelet med: Plavix 7. Risk factor modification 8. Telemetry monitoring  C.R. Nicole Kindred, MD [facility] [phone]  03/20/2016, 5:06 AM

## 2016-03-20 NOTE — Procedures (Signed)
Electroencephalogram (EEG) Report  Date of study: 03/20/16  Requesting clinician: Antony Contras, MD  Reason for study: Evaluate for seizure  Brief clinical history: This is a 83 year old woman who presented following an episode of expressive aphasia that lasted 2 hours and then fully resolved. EEG is requested for further evaluation.  Medications:  Current Facility-Administered Medications:  .  0.9 %  sodium chloride infusion, , Intravenous, Continuous, Rise Patience, MD, Last Rate: 75 mL/hr at 03/20/16 0603 .  acetaminophen (TYLENOL) tablet 650 mg, 650 mg, Oral, Q4H PRN **OR** acetaminophen (TYLENOL) solution 650 mg, 650 mg, Per Tube, Q4H PRN **OR** acetaminophen (TYLENOL) suppository 650 mg, 650 mg, Rectal, Q4H PRN, Rise Patience, MD .  amLODipine (NORVASC) tablet 5 mg, 5 mg, Oral, Daily, Rise Patience, MD, 5 mg at 03/20/16 1330 .  cefTRIAXone (ROCEPHIN) 1 g in dextrose 5 % 50 mL IVPB, 1 g, Intravenous, Daily, Rise Patience, MD, 1 g at 03/20/16 0741 .  clopidogrel (PLAVIX) tablet 75 mg, 75 mg, Oral, Daily, Irine Seal V, MD, 75 mg at 03/20/16 1330 .  divalproex (DEPAKOTE ER) 24 hr tablet 500 mg, 500 mg, Oral, Daily, Garvin Fila, MD .  enoxaparin (LOVENOX) injection 40 mg, 40 mg, Subcutaneous, Q24H, Rise Patience, MD, 40 mg at 03/20/16 1335 .  levothyroxine (SYNTHROID, LEVOTHROID) tablet 100 mcg, 100 mcg, Oral, QAC breakfast, Rise Patience, MD, 100 mcg at 03/20/16 1330 .  multivitamin with minerals tablet 1 tablet, 1 tablet, Oral, Daily, Rise Patience, MD, 1 tablet at 03/20/16 1330 .  pantoprazole (PROTONIX) EC tablet 40 mg, 40 mg, Oral, Daily, Rise Patience, MD, 40 mg at 03/20/16 1330 .  [START ON 03/24/2016] pravastatin (PRAVACHOL) tablet 40 mg, 40 mg, Oral, Weekly, Garvin Fila, MD  Description: This is a routine EEG performed using standard international 10-20 electrode placement. A total of 18 channels are recorded, including one  for the EKG. Wakefulness and drowsiness are recorded.   Activating Maneuvers: Photic stimulation  Findings:  The EKG channel demonstrates an irregularly irregular rhythm with a rate of 90-100 beats per minute.   The background consists of well-formed alpha activity. The best dominant posterior rhythm is 9-10. This is symmetric and reacts as expected with eye opening.   There are no focal asymmetries. No epileptiform discharges are present. No seizures are recorded.   Drowsiness is recorded and is normal in appearance.   She has a normal response to photic stimulation.  Impression:  This is a normal awake and drowsy EEG.  Clinical correlation: This normal EEG does not give any clear indication to suggest seizure as cause of her episode of aphasia. If clinical concern for seizure persists, consider obtaining a sleep deprived EEG as outpatient.   Melba Coon, MD [facility]

## 2016-03-20 NOTE — ED Triage Notes (Signed)
Per EMS, pt has been having speech difficulties today. Her son called EMS because "something was off". EMS reports pt is ambulatory and A&O x4 but she is slow to respond. Pt has had a hx of UTI with similar sx. CBG 134.

## 2016-03-21 DIAGNOSIS — I10 Essential (primary) hypertension: Secondary | ICD-10-CM | POA: Diagnosis not present

## 2016-03-21 DIAGNOSIS — E039 Hypothyroidism, unspecified: Secondary | ICD-10-CM | POA: Diagnosis not present

## 2016-03-21 DIAGNOSIS — R4701 Aphasia: Secondary | ICD-10-CM | POA: Diagnosis not present

## 2016-03-21 DIAGNOSIS — E7800 Pure hypercholesterolemia, unspecified: Secondary | ICD-10-CM

## 2016-03-21 DIAGNOSIS — G459 Transient cerebral ischemic attack, unspecified: Secondary | ICD-10-CM | POA: Diagnosis not present

## 2016-03-21 LAB — URINE CULTURE

## 2016-03-21 LAB — HEMOGLOBIN A1C
HEMOGLOBIN A1C: 5.4 % (ref 4.8–5.6)
MEAN PLASMA GLUCOSE: 108 mg/dL

## 2016-03-21 MED ORDER — DIVALPROEX SODIUM ER 500 MG PO TB24: 500.0000 mg | ORAL_TABLET | Freq: Every day | ORAL | 4 refills | Status: DC

## 2016-03-21 MED ORDER — PITAVASTATIN CALCIUM 2 MG PO TABS: 2.0000 mg | ORAL_TABLET | ORAL | 3 refills | Status: DC

## 2016-03-21 NOTE — Discharge Summary (Signed)
Had presented with an expressive aphasia Physician Discharge Summary  Danielle Bryan [id number] DOB: [birth year] DOA: 03/19/2016  PCP: Velna Hatchet, MD  Admit date: 03/19/2016 Discharge date: 03/21/2016  Time spent: 60 minutes  Recommendations for Outpatient Follow-up:  1. Follow-up with Dr. Leonie Man neurology in 6 weeks. 2. Follow-up with Velna Hatchet, MD a 1-2 weeks. On follow-up patient will needs a blood pressure reassessed and a basic metabolic profile done to follow-up on electrolytes and renal function. Patient's statin has also been increased to twice weekly.   Discharge Diagnoses:  Principal Problem:   TIA (transient ischemic attack) Active Problems:   Hypothyroidism   Hypercholesterolemia   Aphasia   HTN (hypertension)   Discharge Condition: Stable and improved.  Diet recommendation: Heart healthy.  Filed Weights   03/20/16 0003 03/20/16 0515  Weight: 59.4 kg (131 lb) 64.7 kg (142 lb 11.2 oz)    History of present illness:  Per Danielle Bryan is a 83 y.o. female with history of TIA, hypertension, hyperlipidemia, hypothyroidism was noticed by patient's son to be having difficulty expressing words. This was noticed around 10 PM. As per the patient the symptoms lasted for around 2 hours and resolved. Denied any weakness of the upper or lower extremities or any difficulty swallowing or any visual symptoms. In the ER patient was found to be nonfocal. CT head was showing old infarct. On-call neurologist has been consulted and patient was being admitted for TIA. Patient denied any headache chest pain or shortness of breath.   ED Course: CT head was unremarkable showed old infarct. EKG showed normal sinus rhythm. Neurology was consulted.   Hospital Course:  #1 probable TIA possibly embolic from an unknown source Patient had presented with an expressive aphasia with prior history of TIA, hypertension, hyperlipidemia, hypothyroidism with rapid improvement in  symptoms and a such TPA was not offered. CT head which was done showed a small remote right MCA territory infarct. MRI/MRA of the head which was done was negative for any acute abnormalities. Neurology was consulted and patient underwent the stroke/TIA workup. Carotid Dopplers obtained had 1-39% ICA plaque with vertebral artery flow antegrade. 2-D echo which was done had mild LVH with vigorous systolic function with a EF of 65-70% with no wall motion abnormalities and no source of emboli. LDL obtained was 103 patient was on a statin prior to admission weekly however due to myalgias statin was increased to twice weekly. Hemoglobin A1c was 5.4. Patient had no further symptoms during the hospitalization. EEG which was done was negative for any seizures. Per neurology patient had similar episode of fiesta ago with a negative workup but EEG had showed some focal abnormalities on the left and at that time patient was placed on Keppra for short while which was discontinued. Neurology had recommended starting patient on Depakote for seizure prophylaxis. Patient was also maintained on Plavix for secondary stroke prevention. Patient improved clinically and patient be discharged home in stable and improved condition and is to follow-up with neurology, Dr. Leonie Man 6 weeks post discharge.  #2 hypertension Patient's blood pressure remained stable throughout the hospitalization.  #3 hyperlipidemia Patient's LDL was 103. Goal less than 70. Patient noted to have an intolerance to statins secondary to myalgias and a such was on a statin weekly. Was recommended that statin be increased to twice weekly which patient be discharged on.  #4 bacteriuria Urinalysis done on admission was concerning for UTI and assessed patient was started on IV Rocephin. Patient received 2 days  of IV Rocephin. Urine cultures were negative. No further antibiotics needed.  #5 hypothyroidism Continued on home regimen of  Synthroid.   Procedures:  2 d echo 03/20/2016  CT head 03/20/2016  MRI MRA brain 03/20/2016  Carotid Dopplers 03/20/2016  Consultations:  Neurology: Dr Nicole Kindred 03/20/2016  Discharge Exam: Vitals:   03/21/16 0541 03/21/16 1022  BP: (!) 141/52 (!) 135/56  Pulse: 86 79  Resp: 16 18  Temp:  98.1 F (36.7 C)    General: NAD Cardiovascular: RRR Respiratory: CTAB  Discharge Instructions   Discharge Instructions    Ambulatory referral to Neurology    Complete by:  As directed    An appointment is requested in approximately: F/U IN 6 WEEKS.   Diet - low sodium heart healthy    Complete by:  As directed    Increase activity slowly    Complete by:  As directed      Current Discharge Medication List    START taking these medications   Details  divalproex (DEPAKOTE ER) 500 MG 24 hr tablet Take 1 tablet (500 mg total) by mouth daily. Qty: 30 tablet, Refills: 4      CONTINUE these medications which have CHANGED   Details  Pitavastatin Calcium (LIVALO) 2 MG TABS Take 1 tablet (2 mg total) by mouth once a week. Take 2 mg by mouth twice a week. Qty: 10 tablet, Refills: 3      CONTINUE these medications which have NOT CHANGED   Details  acetaminophen (TYLENOL) 325 MG tablet Take 325 mg by mouth every 6 (six) hours as needed for mild pain.     amLODipine (NORVASC) 5 MG tablet Take 5 mg by mouth daily.    Calcium Carb-Cholecalciferol (CALCIUM 600/VITAMIN D3) 600-800 MG-UNIT TABS Take 1 tablet by mouth 2 (two) times daily.     clopidogrel (PLAVIX) 75 MG tablet Take 1 tablet (75 mg total) by mouth daily. Qty: 30 tablet, Refills: 3    levothyroxine (SYNTHROID, LEVOTHROID) 100 MCG tablet TAKE 1 TABLET (100 MCG TOTAL) BY MOUTH DAILY. TAKING 6 DAYS A WEEK. DON'T TAKE ON SUNDAYS Qty: 90 tablet, Refills: 0    Multiple Vitamin (MULTIVITAMIN WITH MINERALS) TABS tablet Take 1 tablet by mouth daily.    pantoprazole (PROTONIX) 40 MG tablet Take 40 mg by mouth daily.        Allergies  Allergen Reactions  . Escitalopram Oxalate Nausea Only  . Lisinopril Other (See Comments)    Very decreased blood pressure   . Statins     myalgias  . Sulfa Antibiotics     hives   Follow-up Information    HOLWERDA, SCOTT, MD. Schedule an appointment as soon as possible for a visit in 1 week(s).   Specialty:  Internal Medicine Why:  f/u in 1-2 weeks. Contact information: [place] [address] [phone]        SETHI,PRAMOD, MD. Schedule an appointment as soon as possible for a visit in 6 week(s).   Specialties:  Neurology, Radiology Why:  Office will call with appointment time and date. Contact information: [address] [place] [address] [phone]            The results of significant diagnostics from this hospitalization (including imaging, microbiology, ancillary and laboratory) are listed below for reference.    Significant Diagnostic Studies: Ct Head Wo Contrast  Result Date: 03/20/2016 CLINICAL DATA:  Initial evaluation for aphasia that is improving. EXAM: CT HEAD WITHOUT CONTRAST TECHNIQUE: Contiguous axial images  were obtained from the base of the skull through the vertex without intravenous contrast. COMPARISON:  Prior CT from 03/18/2015. FINDINGS: Brain: Generalized age-related cerebral atrophy with moderate chronic microvascular ischemic disease, similar to previous. Microvascular changes present within the pons as well. Small focus of encephalomalacia within the right frontal operculum compatible with a small remote right MCA territory infarct, also stable from previous. No acute intracranial hemorrhage identified. No evidence for acute large vessel territory infarct. No mass lesion, midline shift or mass effect. No hydrocephalus. No extra-axial fluid collection. Vascular: Prominent vascular calcifications present within the carotid siphons. No hyperdense vessel. Skull: Scalp soft tissues demonstrate no acute  abnormality. Calvarium intact. Sinuses/Orbits: Globes and orbital soft tissues within normal limits. Patient is status post lens extraction bilaterally. Paranasal sinuses and mastoid air cells are clear. IMPRESSION: 1. No acute intracranial infarct or other process identified. 2. Small remote right MCA territory infarct, stable from previous. 3. Stable atrophy with moderate chronic microvascular ischemic disease. Electronically Signed   By: Jeannine Boga M.D.   On: 03/20/2016 02:19   Mr Brain Wo Contrast  Result Date: 03/20/2016 CLINICAL DATA:  TIA.  Transient speech disturbance.  Prior stroke. EXAM: MRI HEAD WITHOUT CONTRAST MRA HEAD WITHOUT CONTRAST TECHNIQUE: Multiplanar, multiecho pulse sequences of the brain and surrounding structures were obtained without intravenous contrast. Angiographic images of the head were obtained using MRA technique without contrast. COMPARISON:  Head CT 03/20/2016 and MRI 03/20/2014 FINDINGS: MRI HEAD FINDINGS Brain: There is no evidence of acute infarct, intracranial hemorrhage, mass, midline shift, or extra-axial fluid collection. There is mild to moderate generalized cerebral atrophy. A small chronic right MCA infarct is again seen involving the frontoparietal operculum. Patchy T2 hyperintensities elsewhere throughout the cerebral white matter bilaterally and in the pons are unchanged from the prior MRI and compatible with moderate chronic small vessel ischemic disease. Chronic lacunar infarcts are again seen involving the bilateral thalami and basal ganglia. Vascular: Chronically abnormal appearance of the distal left vertebral artery, more fully evaluated below. Skull and upper cervical spine: Unremarkable bone marrow signal. Sinuses/Orbits: Prior bilateral cataract extraction. No significant sinus disease. Other: None. MRA HEAD FINDINGS The visualized distal right vertebral artery is patent and supplies the basilar. The left vertebral artery occludes just beyond the  PICA origin. There is minimal flow in the distal left V4 segment near the vertebrobasilar junction, possibly retrograde. The right AICA is dominant. SCA origins are patent. Basilar artery is patent with a mild stenosis proximal to the SCA origins. There is a moderately large right posterior communicating artery. PCAs are patent with mild origin stenosis on the left. There is also mild mid left P2 stenosis. The internal carotid arteries are widely patent from skullbase to carotid termini. ACAs and MCAs are patent without evidence of major branch occlusion or A1 or M1 segment stenosis. There is diffuse MCA branch vessel irregularity bilaterally with numerous moderate to severe stenoses, including proximal M2 stenoses which are severe on the right and moderate to severe on the left. There is also a distal ACA branch vessel irregularity and narrowing. No intracranial aneurysm is identified. IMPRESSION: 1. No acute intracranial abnormality. 2. Moderate chronic small vessel ischemic disease in the cerebral white matter and brainstem. 3. Chronic right MCA cortical infarct and chronic bilateral deep gray nuclei lacunar infarcts. 4. Advanced intracranial atherosclerosis primarily involving the small to medium-sized branch vessels of the anterior circulation. 5. Distal left vertebral artery occlusion. 6. Mild distal basilar artery stenosis. Electronically Signed   By:  Logan Bores M.D.   On: 03/20/2016 11:33   Mr Jodene Nam Head/brain [id number] Cm  Result Date: 03/20/2016 CLINICAL DATA:  TIA.  Transient speech disturbance.  Prior stroke. EXAM: MRI HEAD WITHOUT CONTRAST MRA HEAD WITHOUT CONTRAST TECHNIQUE: Multiplanar, multiecho pulse sequences of the brain and surrounding structures were obtained without intravenous contrast. Angiographic images of the head were obtained using MRA technique without contrast. COMPARISON:  Head CT 03/20/2016 and MRI 03/20/2014 FINDINGS: MRI HEAD FINDINGS Brain: There is no evidence of acute infarct,  intracranial hemorrhage, mass, midline shift, or extra-axial fluid collection. There is mild to moderate generalized cerebral atrophy. A small chronic right MCA infarct is again seen involving the frontoparietal operculum. Patchy T2 hyperintensities elsewhere throughout the cerebral white matter bilaterally and in the pons are unchanged from the prior MRI and compatible with moderate chronic small vessel ischemic disease. Chronic lacunar infarcts are again seen involving the bilateral thalami and basal ganglia. Vascular: Chronically abnormal appearance of the distal left vertebral artery, more fully evaluated below. Skull and upper cervical spine: Unremarkable bone marrow signal. Sinuses/Orbits: Prior bilateral cataract extraction. No significant sinus disease. Other: None. MRA HEAD FINDINGS The visualized distal right vertebral artery is patent and supplies the basilar. The left vertebral artery occludes just beyond the PICA origin. There is minimal flow in the distal left V4 segment near the vertebrobasilar junction, possibly retrograde. The right AICA is dominant. SCA origins are patent. Basilar artery is patent with a mild stenosis proximal to the SCA origins. There is a moderately large right posterior communicating artery. PCAs are patent with mild origin stenosis on the left. There is also mild mid left P2 stenosis. The internal carotid arteries are widely patent from skullbase to carotid termini. ACAs and MCAs are patent without evidence of major branch occlusion or A1 or M1 segment stenosis. There is diffuse MCA branch vessel irregularity bilaterally with numerous moderate to severe stenoses, including proximal M2 stenoses which are severe on the right and moderate to severe on the left. There is also a distal ACA branch vessel irregularity and narrowing. No intracranial aneurysm is identified. IMPRESSION: 1. No acute intracranial abnormality. 2. Moderate chronic small vessel ischemic disease in the  cerebral white matter and brainstem. 3. Chronic right MCA cortical infarct and chronic bilateral deep gray nuclei lacunar infarcts. 4. Advanced intracranial atherosclerosis primarily involving the small to medium-sized branch vessels of the anterior circulation. 5. Distal left vertebral artery occlusion. 6. Mild distal basilar artery stenosis. Electronically Signed   By: Logan Bores M.D.   On: 03/20/2016 11:33    Microbiology: Recent Results (from the past 240 hour(s))  Urine culture     Status: Abnormal   Collection Time: 03/20/16  1:20 AM  Result Value Ref Range Status   Specimen Description URINE, RANDOM  Final   Special Requests NONE  Final   Culture MULTIPLE SPECIES PRESENT, SUGGEST RECOLLECTION (A)  Final   Report Status 03/21/2016 FINAL  Final     Labs: Basic Metabolic Panel:  Recent Labs Lab 03/20/16 0031 03/20/16 0608  NA 133* 138  K 3.9 4.0  CL 99* 105  CO2 22 24  GLUCOSE 131* 123*  BUN 22* 17  CREATININE 0.88 0.84  CALCIUM 9.6 9.6   Liver Function Tests:  Recent Labs Lab 03/20/16 0031 03/20/16 0608  AST 22 23  ALT 12* 11*  ALKPHOS 72 64  BILITOT 0.5 0.7  PROT 6.9 6.1*  ALBUMIN 4.2 3.7   No results for input(s): LIPASE, AMYLASE in  the last 168 hours. No results for input(s): AMMONIA in the last 168 hours. CBC:  Recent Labs Lab 03/20/16 0031 03/20/16 0608  WBC 7.7 6.4  NEUTROABS 4.6  --   HGB 12.4 12.0  HCT 37.5 37.0  MCV 89.5 90.2  PLT 175 153   Cardiac Enzymes: No results for input(s): CKTOTAL, CKMB, CKMBINDEX, TROPONINI in the last 168 hours. BNP: BNP (last 3 results) No results for input(s): BNP in the last 8760 hours.  ProBNP (last 3 results) No results for input(s): PROBNP in the last 8760 hours.  CBG: No results for input(s): GLUCAP in the last 168 hours.     SignedIrine Seal MD.  [facility] 03/21/2016, 1:53 PM

## 2016-03-21 NOTE — Progress Notes (Signed)
STROKE TEAM PROGRESS NOTE   HISTORY OF PRESENT ILLNESS (per record) Danielle Bryan is an 83 y.o. female with a history of hypertension, hyperlipidemia, hypothyroidism and TIA, brought to the ED following onset of speech output difficulty. Patient knew what she wanted to say, but had difficulty getting the words out. Speech was also slurred. Symptoms were similar to those experienced with her previous TIA. She did not experience weakness nor numbness involving extremities. CT scan of her head showed no acute findings. Old right MCA territory infarction was noted. NIH stroke score at the time of this evaluation was 0. Patient's speech difficulty resolved after arriving in the ED.  LSN: 6:00 PM on 03/19/2016 tPA Given: No: Deficits rapidly resolved mRankin:   SUBJECTIVE (INTERVAL HISTORY) Her family  Is not  at the bedside.  Overall she feels her condition is completely resolved. She has no complaints and wants to go home.EEG was normal  OBJECTIVE Temp:  [98.1 F (36.7 C)-98.6 F (37 C)] 98.1 F (36.7 C) (01/26 1022) Pulse Rate:  [79-96] 79 (01/26 1022) Cardiac Rhythm: Normal sinus rhythm (01/26 0809) Resp:  [16-20] 18 (01/26 1022) BP: (135-157)/(52-69) 135/56 (01/26 1022) SpO2:  [97 %-100 %] 98 % (01/26 1022)  CBC:   Recent Labs Lab 03/20/16 0031 03/20/16 0608  WBC 7.7 6.4  NEUTROABS 4.6  --   HGB 12.4 12.0  HCT 37.5 37.0  MCV 89.5 90.2  PLT 175 0000000    Basic Metabolic Panel:   Recent Labs Lab 03/20/16 0031 03/20/16 0608  NA 133* 138  K 3.9 4.0  CL 99* 105  CO2 22 24  GLUCOSE 131* 123*  BUN 22* 17  CREATININE 0.88 0.84  CALCIUM 9.6 9.6    Lipid Panel:     Component Value Date/Time   CHOL 197 03/20/2016 0608   TRIG 36 03/20/2016 0608   HDL 87 03/20/2016 0608   CHOLHDL 2.3 03/20/2016 0608   VLDL 7 03/20/2016 0608   LDLCALC 103 (H) 03/20/2016 0608   HgbA1c:  Lab Results  Component Value Date   HGBA1C 5.4 03/20/2016   Urine Drug Screen: No results found  for: LABOPIA, COCAINSCRNUR, LABBENZ, AMPHETMU, THCU, LABBARB    IMAGING  Ct Head Wo Contrast 03/20/2016 1. No acute intracranial infarct or other process identified.  2. Small remote right MCA territory infarct, stable from previous.  3. Stable atrophy with moderate chronic microvascular ischemic disease.     MRI / MRA Head - pending    PHYSICAL EXAM Frail pleasant elderly caucasian lady not in distress. . Afebrile. Head is nontraumatic. Neck is supple without bruit.    Cardiac exam no murmur or gallop. Lungs are clear to auscultation. Distal pulses are well felt.  Neurological Exam ;  Awake  Alert oriented x 3. Normal speech and language.no word hesitancy. Able to name, repeat and comprehend quite well. Diminished attention, registration and recall. eye movements full without nystagmus.fundi were not visualized. Vision acuity and fields appear normal. Hearing is normal. Palatal movements are normal. Face symmetric. Tongue midline. Normal strength, tone, reflexes and coordination. Normal sensation. Gait deferred.      ASSESSMENT/PLAN [name] is a 83 y.o. female with history of previous stroke, TIAs, hypothyroidism, hypertension, and hyperlipidemia presenting with speech difficulties. She did not receive IV t-PA due to improvement in deficits.  Stroke / TIA: Dominant - possibly embolic from an unknown source.  Resultant  no deficits  MRI - no acute infarct. Old right MCA branch and bilateral  basal ganglia lacunar infarcts   MRA - advanced intracranial atherosclerotic changes involving small to medium size vessels. Mild distal basilar stenosis and left vertebral distal occlusion  CT - Small remote right MCA territory infarct,  Carotid Doppler - 1-39% ICA plaquing. Vertebral artery flow is antegrade.  2D Echo - Left ventricle: The cavity size was normal. Wall thickness was   increased in a pattern of mild LVH. Systolic function was   vigorous. The estimated ejection  fraction was in the range of 65%   to 70%. Wall motion was normal; there were no regional wall    motion abnormalitiesLDL - 103  HgbA1c - pending  VTE prophylaxis - Lovenox Diet Heart Room service appropriate? Yes; Fluid consistency: Thin  clopidogrel 75 mg daily prior to admission, now on clopidogrel 75 mg daily  Patient counseled to be compliant with her antithrombotic medications  Ongoing aggressive stroke risk factor management Therapy recommendations: home Disposition:  home Hypertension  Stable  Permissive hypertension (OK if < 220/120) but gradually normalize in 5-7 days  Long-term BP goal normotensive  Hyperlipidemia  Home meds: No lipid lowering medications prior to admission  LDL 103, goal < 70  Intolerant to statin secondary to myalgias  Continue statin at discharge   Other Stroke Risk Factors  Advanced age  Overweight, Body mass index is 26.96 kg/m., recommend weight loss, diet and exercise as appropriate   Hx stroke/TIA    Other Active Problems  UTI  Hospital day # 0  I have personally examined this patient, reviewed notes, independently viewed imaging studies, participated in medical decision making and plan of care.ROS completed by me personally and pertinent positives fully documented  I have made any additions or clarifications directly to the above note. Patient has presented with transient episode of expressive language difficulties with negative brain imaging study. She had similar episode a few years ago with negative workup as well but EEG had shown some focal abnormalities on the left. She was placed on Keppra for a short while but it was discontinued. Continue Depakote for seizure prophylaxis. Continue Plavix for stroke prevention. Consider increasing home dose of Pitavastatin to 2 mg twice a week to him for LDL cholesterol below 70 mg percent. I had a long discussion the patient   and answered questions. Discussed with Dr. Grandville Silos. Greater  than 50% time during this 15 minute visit was spent on counseling and coordination of care about her speech difficulties, TIA, seizure, evaluation plan and treatment discussion.stroke team will sign off. Call for questions. F/u in stroke clinic in 6 weeks  Antony Contras, MD Medical Director [facility] Pager: [phone] 03/21/2016 1:16 PM   To contact Stroke Continuity provider, please refer to [email or web address] After hours, contact General Neurology

## 2016-03-21 NOTE — Care Management Note (Signed)
Case Management Note  Patient Details  Name: Danielle Bryan MRN: [id number] Date of Birth: 09-11-1923  Subjective/Objective:                    Action/Plan: Pt discharging home with self care. Pt lives alone but her son checks on her. No f/u per PT/OT and no DME needs. Pt with insurance and PCP. No further needs per CM.   Expected Discharge Date:  03/21/16               Expected Discharge Plan:  Home/Self Care  In-House Referral:     Discharge planning Services     Post Acute Care Choice:    Choice offered to:     DME Arranged:    DME Agency:     HH Arranged:    HH Agency:     Status of Service:  Completed, signed off  If discussed at [employer] of Stay Meetings, dates discussed:    Additional Comments:  Pollie Friar, RN 03/21/2016, 2:10 PM

## 2016-03-21 NOTE — Evaluation (Signed)
Speech Language Pathology Evaluation Patient Details Name: Danielle Bryan MRN: [id number] DOB: 1923/11/23 Today's Date: 03/21/2016 Time: [id number] SLP Time Calculation (min) (ACUTE ONLY): 15 min  Problem List:  Patient Active Problem List   Diagnosis Date Noted  . Aphasia 03/20/2016  . HTN (hypertension) 03/20/2016  . Weakness 03/18/2015  . Acute kidney injury ([place]) 03/18/2015  . Dehydration 03/18/2015  . Nausea and vomiting 03/18/2015  . Thrombocytopenia ([place]) 03/18/2015  . Moderate nausea and vomiting 03/18/2015  . Elevated lactic acid level 03/18/2015  . Elevated troponin 03/18/2015  . Encephalopathy, metabolic 123456  . UTI (urinary tract infection) 08/22/2014  . TIA (transient ischemic attack) 03/19/2014  . Benign hypertensive heart disease without heart failure 08/20/2010  . Hypothyroidism   . Osteoarthritis   . Hypercholesterolemia   . Reactive depression (situational)    Past Medical History:  Past Medical History:  Diagnosis Date  . Hypercholesterolemia   . Hypertension   . Hypothyroidism   . Osteoarthritis   . Reactive depression (situational)   . TIA (transient ischemic attack)    on plavix   Past Surgical History:  Past Surgical History:  Procedure Laterality Date  . CATARACT EXTRACTION Left 1992  . CATARACT EXTRACTION Right 1995  . DEBRIDEMENT AND CLOSURE WOUND     HPI:  Danielle Bryan a 83 y.o.femalewith history of TIA, hypertension, hyperlipidemia, hypothyroidism was noticed by patient's son to be having difficulty expressing words. This was noticed around 10 PM. As per the patient the symptoms lasted for around 2 hours and resolved. Denies any weakness of the upper or lower extremities or any difficulty swallowing or any visual symptoms. In the ER patient was found to be nonfocal. CT head was showing old infarct. On-call neurologist has been consulted and patient is being admitted for TIA. Patient denies any headache chest pain or shortness of  breath. MRI is negative for acute findings.     Assessment / Plan / Recommendation Clinical Impression  The patient's language and motor speech skills were informally assessed and no obvious deficits were noted.  The patient's speech was clear and easy to understand.  No obvious dysarthria noted.  The patient was able to easily answer simple and complex yes/no questions, answer questions about a short paragraph read aloud, identify pictures, follow complex directions, read and comprehend short phrases, name pictures and answer wh questions.  She was also able to generate a list of 10 items given a named category.  She easily carried on a conversation with no noted paraphasias and was able to discuss reason for current admission as well as procedures/tests she has had. Overall, the patient's language skills are functional and follow up ST is not indicated at this time.      SLP Assessment  Patient does not need any further Speech Lanaguage Pathology Services             SLP Evaluation Cognition          Comprehension  Auditory Comprehension Overall Auditory Comprehension: Appears within functional limits for tasks assessed Yes/No Questions: Within Functional Limits Commands: Within Functional Limits Conversation: Complex Reading Comprehension Reading Status: Within funtional limits    Expression Expression Primary Mode of Expression: Verbal Verbal Expression Overall Verbal Expression: Appears within functional limits for tasks assessed Initiation: No impairment Automatic Speech: Name;Social Response Level of Generative/Spontaneous Verbalization: Conversation Repetition: No impairment Naming: No impairment Pragmatics: No impairment Non-Verbal Means of Communication: Not applicable Written Expression Dominant Hand: Right Written Expression: Not tested  Oral / Surveyor, quantity Overall Motor Speech: Appears within functional limits for tasks assessed Respiration: Within  functional limits Phonation: Normal Resonance: Within functional limits Articulation: Within functional limitis Intelligibility: Intelligible Motor Planning: Witnin functional limits Motor Speech Errors: Not applicable   GO                    Danielle Bryan, [place], [place] [facility] SLP [phone] Danielle Bryan 03/21/2016, 9:18 AM

## 2016-03-25 LAB — VAS US CAROTID
LEFT ECA DIAS: -7 cm/s
LEFT VERTEBRAL DIAS: -6 cm/s
Left CCA dist dias: 10 cm/s
Left CCA dist sys: 79 cm/s
Left CCA prox dias: 10 cm/s
Left CCA prox sys: 78 cm/s
Left ICA dist dias: -14 cm/s
Left ICA dist sys: -102 cm/s
Left ICA prox dias: -20 cm/s
Left ICA prox sys: -111 cm/s
RCCADSYS: -91 cm/s
RCCAPDIAS: 8 cm/s
RIGHT ECA DIAS: -5 cm/s
RIGHT VERTEBRAL DIAS: -11 cm/s
Right CCA prox sys: 119 cm/s

## 2016-04-08 ENCOUNTER — Telehealth: Payer: Self-pay

## 2016-04-08 NOTE — Telephone Encounter (Signed)
Notes from 03/12/16 OV sent to PCP (Dr. Ardeth Perfect F # [phone]) per faxed request.

## 2016-04-11 DIAGNOSIS — D692 Other nonthrombocytopenic purpura: Secondary | ICD-10-CM | POA: Diagnosis not present

## 2016-04-11 DIAGNOSIS — G459 Transient cerebral ischemic attack, unspecified: Secondary | ICD-10-CM | POA: Diagnosis not present

## 2016-04-11 DIAGNOSIS — E784 Other hyperlipidemia: Secondary | ICD-10-CM | POA: Diagnosis not present

## 2016-04-11 DIAGNOSIS — I10 Essential (primary) hypertension: Secondary | ICD-10-CM | POA: Diagnosis not present

## 2016-04-11 DIAGNOSIS — Z6825 Body mass index (BMI) 25.0-25.9, adult: Secondary | ICD-10-CM | POA: Diagnosis not present

## 2016-05-06 ENCOUNTER — Encounter: Payer: Self-pay | Admitting: Neurology

## 2016-05-06 ENCOUNTER — Ambulatory Visit (INDEPENDENT_AMBULATORY_CARE_PROVIDER_SITE_OTHER): Payer: Medicare HMO | Admitting: Neurology

## 2016-05-06 VITALS — BP 168/91 | HR 98 | Wt 138.2 lb

## 2016-05-06 DIAGNOSIS — G459 Transient cerebral ischemic attack, unspecified: Secondary | ICD-10-CM | POA: Diagnosis not present

## 2016-05-06 NOTE — Patient Instructions (Signed)
I had a long discussion with the patient regarding her episode of transient speech and language difficulties stepping of unclear etiology possibly simple partial seizure versus TIA. I recommend she continue Plavix for stroke prevention and maintain strict control of hypertension with blood pressure goal below 130/90, lipids with LDL cholesterol goal below 70 mg percent and continue Depakote ER 500 mg daily the present dose which is tolerating well without side effects. I also encouraged her to increase participation in mentally challenging activities like solving crossword puzzles, playing sudoku, word searches and bridge as well as memory compensation strategies to help with her mild age related cognitive impairment.. She will return for follow-up in 6 months or call earlier if necessary. Memory Compensation Strategies  1. Use "WARM" strategy.  W= write it down  A= associate it  R= repeat it  M= make a mental note  2.   You can keep a Social worker.  Use a 3-ring notebook with sections for the following: calendar, important names and phone numbers,  medications, doctors' names/phone numbers, lists/reminders, and a section to journal what you did  each day.   3.    Use a calendar to write appointments down.  4.    Write yourself a schedule for the day.  This can be placed on the calendar or in a separate section of the Memory Notebook.  Keeping a  regular schedule can help memory.  5.    Use medication organizer with sections for each day or morning/evening pills.  You may need help loading it  6.    Keep a basket, or pegboard by the door.  Place items that you need to take out with you in the basket or on the pegboard.  You may also want to  include a message board for reminders.  7.    Use sticky notes.  Place sticky notes with reminders in a place where the task is performed.  For example: " turn off the  stove" placed by the stove, "lock the door" placed on the door at eye level, " take  your medications" on  the bathroom mirror or by the place where you normally take your medications.  8.    Use alarms/timers.  Use while cooking to remind yourself to check on food or as a reminder to take your medicine, or as a  reminder to make a call, or as a reminder to perform another task, etc.

## 2016-05-06 NOTE — Progress Notes (Signed)
[facility]    Provider:  Dr Jaynee Eagles Referring Provider: Velna Hatchet, MD Primary Care Physician:  Velna Hatchet, MD  CC: Follow-up for TIA versus seizure versus metabolic encephalopathy  HPI: Danielle Bryan is a 83 y.o. female here as a follow-up  Interval History 03/13/2015: Today blood pressure is 220/90. She is asymptomatic. No more confusional episodes. She has been fine since her metabolic encephalopathy.She is still having crying spells, sometimes all day. She needs follow up for her high blood pressure. She is very emotional. She is sad frequently, she lives alone and she is lonely and she has her son, she goes out a few times a week but otherwise she stays at home, she doesn't have the finances to go into independent care, Can follow up with primary care going forward. She sleeps well most times. She watches TV.  4/7 days she cries and gets emotional and sad.  She cries today in the office, she says her son lost his job.   Interval update 08/22/2014: No more episodes. She is taking the plavix. No more episodes of alteration of awareness. Son is here with her. She is still having daily sadness and crying. Almost every day. Been going on for a few years and is worsening. She cries when her son leaves. Recently, it is worsening. She does want to get out more often and does not feel this has affected her interest and spending time with family and friends. She was on Zoloft years ago and she still cried. This appears to be a long-standing issue. Son provides a lot of the information today. Blood pressure is high again today in the office. He does not feel like she is depressed, but she is otherwise been quite emotional since he can remember. Repeat EEG in March 2016. Normal.  Update 05/07/2016 ; . Patient is seen today for first office follow-up visit following recent hospital admission in January 2018. She was admitted with a transient episode of speech and language  difficulties. On 03/20/16. She knew what she wanted to speak the words did not come out. Speech was also slurred. These symptoms are similar to her previous TIA. MRI scan the brain showed no acute infarct but showed old right MCA territory infarct and basal ganglia lacunar infarcts of old age. MRA of the brain showed advanced and recurrent orthostatic changes involving small to medium vessels with my distal basilar stenosis and left vertebral artery distal occlusion which was unchanged. Carotid Doppler showed no significant stenosis. Counts rose echo was normal. LDL cholesterol was elevated at 103 mg percent. Hemoglobin A1c was 5.4. Patient was started on Depakote ER for seizure prophylaxis even though the EEG on 03/20/16 was normal. She previously had been on Keppra but she discontinued it and when seen by Dr. Lavell Anchors in the office since she was doing well it was not reintroduced. Patient states she's done well since leaving the hospital. Should that no recurrent speech difficulties or any other TIA or strokelike symptoms. She is tolerating Depakote well but is a little concerned about her co-pay. She has kept a log of her blood pressure readings which is have ranged systolic from 161-0960. She is also tolerated increasing the patella statin to 2 mg twice a week without significant myalgias or arthralgias. She continues to live alone and is independent in activities. Her son lives nearby and checks on her. She has given up driving. She does admit to feeling anxious.  Review of Systems: Patient complains of symptoms per  HPI as well as the following symptoms:  shortness of breath, dizziness, joint pain, walking difficulty. Pertinent negatives per HPI. All other systems negative.    Social History   Social History  . Marital status: Married    Spouse name: N/A  . Number of children: 2  . Years of education: 12   Occupational History  . Not on file.   Social History Main Topics  . Smoking status: Never  Smoker  . Smokeless tobacco: Never Used  . Alcohol use No  . Drug use: No  . Sexual activity: Not on file   Other Topics Concern  . Not on file   Social History Narrative   Lives at home alone.    Has 2 children.   Caffeine: occasional     Family History  Problem Relation Age of Onset  . Heart failure Mother   . Pneumonia Mother   . Heart failure Father     Past Medical History:  Diagnosis Date  . Hypercholesterolemia   . Hypertension   . Hypothyroidism   . Osteoarthritis   . Reactive depression (situational)   . Stroke ([place])   . TIA (transient ischemic attack)    on plavix    Past Surgical History:  Procedure Laterality Date  . CATARACT EXTRACTION Left 1992  . CATARACT EXTRACTION Right 1995  . DEBRIDEMENT AND CLOSURE WOUND      Current Outpatient Prescriptions  Medication Sig Dispense Refill  . acetaminophen (TYLENOL) 325 MG tablet Take 325 mg by mouth every 6 (six) hours as needed for mild pain.     Marland Kitchen amLODipine (NORVASC) 5 MG tablet Take 5 mg by mouth daily.    . Calcium Carb-Cholecalciferol (CALCIUM 600/VITAMIN D3) 600-800 MG-UNIT TABS Take 1 tablet by mouth 2 (two) times daily.     . clopidogrel (PLAVIX) 75 MG tablet Take 1 tablet (75 mg total) by mouth daily. 30 tablet 3  . divalproex (DEPAKOTE ER) 500 MG 24 hr tablet Take 1 tablet (500 mg total) by mouth daily. 30 tablet 4  . levothyroxine (SYNTHROID, LEVOTHROID) 100 MCG tablet TAKE 1 TABLET (100 MCG TOTAL) BY MOUTH DAILY. TAKING 6 DAYS A WEEK. DON'T TAKE ON SUNDAYS 90 tablet 0  . Multiple Vitamin (MULTIVITAMIN WITH MINERALS) TABS tablet Take 1 tablet by mouth daily.    . pantoprazole (PROTONIX) 40 MG tablet Take 40 mg by mouth daily.    . Pitavastatin Calcium (LIVALO) 2 MG TABS Take 1 tablet (2 mg total) by mouth once a week. Take 2 mg by mouth twice a week. 10 tablet 3  . Vitamins A & D (VITAMIN A & D) 10000-400 units TABS Use as directed 1,000 Units in the mouth or throat daily.     No current  facility-administered medications for this visit.     Allergies as of 05/06/2016 - Review Complete 05/06/2016  Allergen Reaction Noted  . Escitalopram oxalate Nausea Only 08/16/2010  . Lisinopril Other (See Comments) 11/19/2010  . Statins  08/16/2010  . Sulfa antibiotics  06/19/2011    Vitals: BP (!) 168/91 (BP Location: Right Arm, Patient Position: Sitting, Cuff Size: Small)   Pulse 98   Wt 138 lb 3.2 oz (62.7 kg)   BMI 26.11 kg/m  Last Weight:  Wt Readings from Last 1 Encounters:  05/06/16 138 lb 3.2 oz (62.7 kg)   Last Height:   Ht Readings from Last 1 Encounters:  03/20/16 5\' 1"  (1.549 m)     Physical Exam: Pleasant anxious looking elderly  Caucasian lady not in distress  . Afebrile. Head is nontraumatic. Neck is supple without bruit.    Cardiac exam no murmur or gallop. Lungs are clear to auscultation. Distal pulses are well felt. Neurological Exam : Awake alert oriented 3. Speech:  No aphasia or dysarthria  Cognition:  The patient is oriented to person, place, and time; diminished recall 2/3. Follows two-step commands   Cranial Nerves:  The pupils are equal, round, and reactive to light. Visual fields are full to finger confrontation. Extraocular movements are intact. Trigeminal sensation is intact and the muscles of mastication are normal. The face is symmetric. The palate elevates in the midline. Hearing grossly intact. Voice is normal. Shoulder shrug is normal. The tongue has normal motion without fasciculations.    Gait:  Steady. No ataxia  Motor Observation:  No asymmetry, no atrophy, and no involuntary movements noted. Mild fine action tremors both outstretched upper extremities likely from anxiety Tone:  Normal muscle tone.   Posture:  Mild kyphosis  Strength:  Strength is V/V in the upper and lower limbs.      Assessment/Plan: 83 y.o. female admitted for recurrent episodes of isolated non fluent language without other  abnormalities on neuro-exam and CT brain without acute abnormality.Etiology is indeterminate simple partial seizures versus TIAs fromage-related ,amyloid angiopathy  Stroke workup showed no acute pathology on MRi brain. EEG abnormal while inpatient likely due to metabolic encephalopathy and repeat EEG negative.  Mild underlying chronic anxiety    I had a long discussion with the patient regarding her episode of transient speech and language difficulties stepping of unclear etiology possibly simple partial seizure versus TIA. I recommend she continue Plavix for stroke prevention and maintain strict control of hypertension with blood pressure goal below 130/90, lipids with LDL cholesterol goal below 70 mg percent and continue Depakote ER 500 mg daily the present dose which is tolerating well without side effects. I also encouraged her to increase participation in mentally challenging activities like solving crossword puzzles, playing sudoku, word searches and bridge as well as memory compensation strategies to help with her mild age related cognitive impairment.. Greater than 50% time during this 30 minute visit was spent on counseling and coordination of care about TIA, seizure, anxiety and answering questions She will return for follow-up in 6 months or call earlier if necessary.   Antony Contras, MD  [facility] [address] [place], [place] [address]  Phone [phone] Fax [phone]  A total of 30 minutes was spent face-to-face with this patient. Over half this time was spent on counseling patient on the depression diagnosis and different diagnostic and therapeutic options available.

## 2016-06-09 ENCOUNTER — Ambulatory Visit (HOSPITAL_COMMUNITY)
Admission: EM | Admit: 2016-06-09 | Discharge: 2016-06-09 | Disposition: A | Discharge status: Home / Self Care | Payer: Medicare HMO | Attending: Internal Medicine | Admitting: Internal Medicine

## 2016-06-09 ENCOUNTER — Ambulatory Visit (INDEPENDENT_AMBULATORY_CARE_PROVIDER_SITE_OTHER): Payer: Medicare HMO

## 2016-06-09 ENCOUNTER — Encounter (HOSPITAL_COMMUNITY): Payer: Self-pay | Admitting: Emergency Medicine

## 2016-06-09 ENCOUNTER — Ambulatory Visit (HOSPITAL_COMMUNITY): Payer: Medicare HMO

## 2016-06-09 DIAGNOSIS — M546 Pain in thoracic spine: Secondary | ICD-10-CM

## 2016-06-09 DIAGNOSIS — M5489 Other dorsalgia: Secondary | ICD-10-CM | POA: Diagnosis not present

## 2016-06-09 DIAGNOSIS — W19XXXA Unspecified fall, initial encounter: Secondary | ICD-10-CM

## 2016-06-09 MED ORDER — ACETAMINOPHEN 325 MG PO TABS
650.0000 mg | ORAL_TABLET | Freq: Once | ORAL | Status: AC
  Administered 2016-06-09: 650 mg via ORAL

## 2016-06-09 MED ORDER — HYDROCODONE-ACETAMINOPHEN 5-325 MG PO TABS: ORAL_TABLET | ORAL | 0 refills | Status: DC

## 2016-06-09 MED ORDER — ACETAMINOPHEN 325 MG PO TABS
ORAL_TABLET | ORAL | Status: AC
  Filled 2016-06-09: qty 2

## 2016-06-09 MED ORDER — KETOROLAC TROMETHAMINE 30 MG/ML IJ SOLN
30.0000 mg | Freq: Once | INTRAMUSCULAR | Status: AC
  Administered 2016-06-09: 30 mg via INTRAMUSCULAR

## 2016-06-09 MED ORDER — KETOROLAC TROMETHAMINE 30 MG/ML IJ SOLN
INTRAMUSCULAR | Status: AC
Start: 2016-06-09 — End: 2016-06-09
  Filled 2016-06-09: qty 1

## 2016-06-09 NOTE — ED Triage Notes (Signed)
Fall yesterday morning.  Reports dizziness, lost balance and fell backwards against the bed.  Pain across mid back, bruise right shoulder

## 2016-06-09 NOTE — ED Provider Notes (Signed)
CSN: [id number]     Arrival date & time 06/09/16  1253 History   First MD Initiated Contact with Patient 06/09/16 1337     Chief Complaint  Patient presents with  . Fall   (Consider location/radiation/quality/duration/timing/severity/associated sxs/prior Treatment) Patient fell yesterday and hit her back against the bed frame and she is c/o severe back pain.  She has large bruise over right shoulder but no pain.   The history is provided by the patient.  Fall  This is a new problem. The current episode started 12 to 24 hours ago. The problem occurs constantly. The problem has not changed since onset.Nothing aggravates the symptoms.    Past Medical History:  Diagnosis Date  . Hypercholesterolemia   . Hypertension   . Hypothyroidism   . Osteoarthritis   . Reactive depression (situational)   . Stroke ([place])   . TIA (transient ischemic attack)    on plavix   Past Surgical History:  Procedure Laterality Date  . CATARACT EXTRACTION Left 1992  . CATARACT EXTRACTION Right 1995  . DEBRIDEMENT AND CLOSURE WOUND     Family History  Problem Relation Age of Onset  . Heart failure Mother   . Pneumonia Mother   . Heart failure Father    Social History  Substance Use Topics  . Smoking status: Never Smoker  . Smokeless tobacco: Never Used  . Alcohol use No   OB History    No data available     Review of Systems  Constitutional: Negative.   HENT: Negative.   Eyes: Negative.   Respiratory: Negative.   Cardiovascular: Negative.   Gastrointestinal: Negative.   Endocrine: Negative.   Genitourinary: Negative.   Musculoskeletal: Positive for arthralgias and back pain.  Allergic/Immunologic: Negative.   Neurological: Negative.   Hematological: Negative.   Psychiatric/Behavioral: Negative.     Allergies  Escitalopram oxalate; Lisinopril; Statins; and Sulfa antibiotics  Home Medications   Prior to Admission medications   Medication Sig Start Date End Date Taking?  Authorizing Provider  amLODipine (NORVASC) 5 MG tablet Take 5 mg by mouth daily. 03/18/16  Yes Historical Provider, MD  Calcium Carb-Cholecalciferol (CALCIUM 600/VITAMIN D3) 600-800 MG-UNIT TABS Take 1 tablet by mouth 2 (two) times daily.    Yes Historical Provider, MD  clopidogrel (PLAVIX) 75 MG tablet Take 1 tablet (75 mg total) by mouth daily. 03/21/14  Yes Velna Hatchet, MD  divalproex (DEPAKOTE ER) 500 MG 24 hr tablet Take 1 tablet (500 mg total) by mouth daily. 03/22/16  Yes Eugenie Filler, MD  levothyroxine (SYNTHROID, LEVOTHROID) 100 MCG tablet TAKE 1 TABLET (100 MCG TOTAL) BY MOUTH DAILY. TAKING 6 DAYS A WEEK. DON'T TAKE ON SUNDAYS   Yes Darlin Coco, MD  Multiple Vitamin (MULTIVITAMIN WITH MINERALS) TABS tablet Take 1 tablet by mouth daily.   Yes Historical Provider, MD  Pitavastatin Calcium (LIVALO) 2 MG TABS Take 1 tablet (2 mg total) by mouth once a week. Take 2 mg by mouth twice a week. 03/21/16  Yes Eugenie Filler, MD  Vitamins A & D (VITAMIN A & D) 10000-400 units TABS Use as directed 1,000 Units in the mouth or throat daily. 03/05/16  Yes Historical Provider, MD  acetaminophen (TYLENOL) 325 MG tablet Take 325 mg by mouth every 6 (six) hours as needed for mild pain.  01/09/15   Historical Provider, MD  HYDROcodone-acetaminophen (NORCO/VICODIN) 5-325 MG tablet Take one half to one po q 6 hours prn pain 06/09/16   Lysbeth Penner, FNP  pantoprazole (PROTONIX) 40 MG tablet Take 40 mg by mouth daily.    Historical Provider, MD   Meds Ordered and Administered this Visit   Medications  ketorolac (TORADOL) 30 MG/ML injection 30 mg (30 mg Intramuscular Given 06/09/16 1412)  acetaminophen (TYLENOL) tablet 650 mg (650 mg Oral Given 06/09/16 1414)    BP (!) 149/67 (BP Location: Right Arm)   Pulse 90   Temp 97.9 F (36.6 C) (Oral)   Resp 20   SpO2 100%  No data found.   Physical Exam  Constitutional: She appears well-developed and well-nourished.  HENT:  Head: Normocephalic  and atraumatic.  Eyes: Conjunctivae and EOM are normal. Pupils are equal, round, and reactive to light.  Neck: Normal range of motion. Neck supple.  Cardiovascular: Normal rate, regular rhythm and normal heart sounds.   Pulmonary/Chest: Effort normal and breath sounds normal.  Abdominal: Soft. Bowel sounds are normal.  Musculoskeletal: She exhibits tenderness.  Right shoulder with hematoma but no TTP. TTP bilateral thoracic paraspinous muscles.  Nursing note and vitals reviewed.   Urgent Care Course     Procedures (including critical care time)  Labs Review Labs Reviewed - No data to display  Imaging Review Dg Thoracic Spine 2 View  Result Date: 06/09/2016 CLINICAL DATA:  Fall, severe back pain EXAM: THORACIC SPINE 2 VIEWS COMPARISON:  Lateral chest radiograph dated 03/19/2014 FINDINGS: Mild mid thoracic levoscoliosis.  Exaggerated mid thoracic kyphosis. No evidence of acute fracture or dislocation. Vertebral body heights are essentially preserved. Mild anterior wedging of a lower thoracic vertebral body, likely T9 or T10, unchanged since 2016. Visualized lungs are essentially clear. IMPRESSION: No acute fracture is seen. Exaggerated thoracic kyphosis with mild chronic wedging of a lower thoracic vertebral body. Electronically Signed   By: Julian Hy M.D.   On: 06/09/2016 14:19     Visual Acuity Review  Right Eye Distance:   Left Eye Distance:   Bilateral Distance:    Right Eye Near:   Left Eye Near:    Bilateral Near:         MDM   1. Fall, initial encounter   2. Acute bilateral thoracic back pain    Tylenol here  Toradol 30mg  IM shot here Norco 5/325 one half to one po qid prn #12 Patient is going to stay with son for next few days      Lysbeth Penner, [place] 06/09/16 1453

## 2016-06-13 DIAGNOSIS — M545 Low back pain: Secondary | ICD-10-CM | POA: Diagnosis not present

## 2016-06-13 DIAGNOSIS — R279 Unspecified lack of coordination: Secondary | ICD-10-CM | POA: Diagnosis not present

## 2016-06-13 DIAGNOSIS — W19XXXA Unspecified fall, initial encounter: Secondary | ICD-10-CM | POA: Diagnosis not present

## 2016-06-13 DIAGNOSIS — K5900 Constipation, unspecified: Secondary | ICD-10-CM | POA: Diagnosis not present

## 2016-06-29 DIAGNOSIS — F3289 Other specified depressive episodes: Secondary | ICD-10-CM | POA: Diagnosis not present

## 2016-06-29 DIAGNOSIS — M170 Bilateral primary osteoarthritis of knee: Secondary | ICD-10-CM | POA: Diagnosis not present

## 2016-06-29 DIAGNOSIS — Z7902 Long term (current) use of antithrombotics/antiplatelets: Secondary | ICD-10-CM | POA: Diagnosis not present

## 2016-06-29 DIAGNOSIS — I10 Essential (primary) hypertension: Secondary | ICD-10-CM | POA: Diagnosis not present

## 2016-06-29 DIAGNOSIS — Z8673 Personal history of transient ischemic attack (TIA), and cerebral infarction without residual deficits: Secondary | ICD-10-CM | POA: Diagnosis not present

## 2016-06-29 DIAGNOSIS — Z9181 History of falling: Secondary | ICD-10-CM | POA: Diagnosis not present

## 2016-06-29 DIAGNOSIS — M545 Low back pain: Secondary | ICD-10-CM | POA: Diagnosis not present

## 2016-06-29 DIAGNOSIS — F329 Major depressive disorder, single episode, unspecified: Secondary | ICD-10-CM | POA: Diagnosis not present

## 2016-07-08 DIAGNOSIS — Z7902 Long term (current) use of antithrombotics/antiplatelets: Secondary | ICD-10-CM | POA: Diagnosis not present

## 2016-07-08 DIAGNOSIS — Z9181 History of falling: Secondary | ICD-10-CM | POA: Diagnosis not present

## 2016-07-08 DIAGNOSIS — Z8673 Personal history of transient ischemic attack (TIA), and cerebral infarction without residual deficits: Secondary | ICD-10-CM | POA: Diagnosis not present

## 2016-07-08 DIAGNOSIS — M545 Low back pain: Secondary | ICD-10-CM | POA: Diagnosis not present

## 2016-07-08 DIAGNOSIS — I10 Essential (primary) hypertension: Secondary | ICD-10-CM | POA: Diagnosis not present

## 2016-07-08 DIAGNOSIS — M170 Bilateral primary osteoarthritis of knee: Secondary | ICD-10-CM | POA: Diagnosis not present

## 2016-07-08 DIAGNOSIS — F329 Major depressive disorder, single episode, unspecified: Secondary | ICD-10-CM | POA: Diagnosis not present

## 2016-07-10 DIAGNOSIS — Z9181 History of falling: Secondary | ICD-10-CM | POA: Diagnosis not present

## 2016-07-10 DIAGNOSIS — Z8673 Personal history of transient ischemic attack (TIA), and cerebral infarction without residual deficits: Secondary | ICD-10-CM | POA: Diagnosis not present

## 2016-07-10 DIAGNOSIS — Z7902 Long term (current) use of antithrombotics/antiplatelets: Secondary | ICD-10-CM | POA: Diagnosis not present

## 2016-07-10 DIAGNOSIS — I10 Essential (primary) hypertension: Secondary | ICD-10-CM | POA: Diagnosis not present

## 2016-07-10 DIAGNOSIS — F329 Major depressive disorder, single episode, unspecified: Secondary | ICD-10-CM | POA: Diagnosis not present

## 2016-07-10 DIAGNOSIS — M170 Bilateral primary osteoarthritis of knee: Secondary | ICD-10-CM | POA: Diagnosis not present

## 2016-07-10 DIAGNOSIS — M545 Low back pain: Secondary | ICD-10-CM | POA: Diagnosis not present

## 2016-07-15 DIAGNOSIS — M545 Low back pain: Secondary | ICD-10-CM | POA: Diagnosis not present

## 2016-07-15 DIAGNOSIS — Z8673 Personal history of transient ischemic attack (TIA), and cerebral infarction without residual deficits: Secondary | ICD-10-CM | POA: Diagnosis not present

## 2016-07-15 DIAGNOSIS — Z9181 History of falling: Secondary | ICD-10-CM | POA: Diagnosis not present

## 2016-07-15 DIAGNOSIS — I10 Essential (primary) hypertension: Secondary | ICD-10-CM | POA: Diagnosis not present

## 2016-07-15 DIAGNOSIS — F329 Major depressive disorder, single episode, unspecified: Secondary | ICD-10-CM | POA: Diagnosis not present

## 2016-07-15 DIAGNOSIS — Z7902 Long term (current) use of antithrombotics/antiplatelets: Secondary | ICD-10-CM | POA: Diagnosis not present

## 2016-07-15 DIAGNOSIS — M170 Bilateral primary osteoarthritis of knee: Secondary | ICD-10-CM | POA: Diagnosis not present

## 2016-07-17 DIAGNOSIS — Z7902 Long term (current) use of antithrombotics/antiplatelets: Secondary | ICD-10-CM | POA: Diagnosis not present

## 2016-07-17 DIAGNOSIS — Z8673 Personal history of transient ischemic attack (TIA), and cerebral infarction without residual deficits: Secondary | ICD-10-CM | POA: Diagnosis not present

## 2016-07-17 DIAGNOSIS — M545 Low back pain: Secondary | ICD-10-CM | POA: Diagnosis not present

## 2016-07-17 DIAGNOSIS — F329 Major depressive disorder, single episode, unspecified: Secondary | ICD-10-CM | POA: Diagnosis not present

## 2016-07-17 DIAGNOSIS — I10 Essential (primary) hypertension: Secondary | ICD-10-CM | POA: Diagnosis not present

## 2016-07-17 DIAGNOSIS — Z9181 History of falling: Secondary | ICD-10-CM | POA: Diagnosis not present

## 2016-07-17 DIAGNOSIS — M170 Bilateral primary osteoarthritis of knee: Secondary | ICD-10-CM | POA: Diagnosis not present

## 2016-07-23 DIAGNOSIS — Z7902 Long term (current) use of antithrombotics/antiplatelets: Secondary | ICD-10-CM | POA: Diagnosis not present

## 2016-07-23 DIAGNOSIS — Z9181 History of falling: Secondary | ICD-10-CM | POA: Diagnosis not present

## 2016-07-23 DIAGNOSIS — Z8673 Personal history of transient ischemic attack (TIA), and cerebral infarction without residual deficits: Secondary | ICD-10-CM | POA: Diagnosis not present

## 2016-07-23 DIAGNOSIS — M170 Bilateral primary osteoarthritis of knee: Secondary | ICD-10-CM | POA: Diagnosis not present

## 2016-07-23 DIAGNOSIS — F329 Major depressive disorder, single episode, unspecified: Secondary | ICD-10-CM | POA: Diagnosis not present

## 2016-07-23 DIAGNOSIS — M545 Low back pain: Secondary | ICD-10-CM | POA: Diagnosis not present

## 2016-07-23 DIAGNOSIS — I10 Essential (primary) hypertension: Secondary | ICD-10-CM | POA: Diagnosis not present

## 2016-07-25 DIAGNOSIS — Z7902 Long term (current) use of antithrombotics/antiplatelets: Secondary | ICD-10-CM | POA: Diagnosis not present

## 2016-07-25 DIAGNOSIS — M170 Bilateral primary osteoarthritis of knee: Secondary | ICD-10-CM | POA: Diagnosis not present

## 2016-07-25 DIAGNOSIS — Z8673 Personal history of transient ischemic attack (TIA), and cerebral infarction without residual deficits: Secondary | ICD-10-CM | POA: Diagnosis not present

## 2016-07-25 DIAGNOSIS — Z9181 History of falling: Secondary | ICD-10-CM | POA: Diagnosis not present

## 2016-07-25 DIAGNOSIS — M545 Low back pain: Secondary | ICD-10-CM | POA: Diagnosis not present

## 2016-07-25 DIAGNOSIS — I10 Essential (primary) hypertension: Secondary | ICD-10-CM | POA: Diagnosis not present

## 2016-07-25 DIAGNOSIS — F329 Major depressive disorder, single episode, unspecified: Secondary | ICD-10-CM | POA: Diagnosis not present

## 2016-07-29 DIAGNOSIS — M545 Low back pain: Secondary | ICD-10-CM | POA: Diagnosis not present

## 2016-07-29 DIAGNOSIS — I10 Essential (primary) hypertension: Secondary | ICD-10-CM | POA: Diagnosis not present

## 2016-07-29 DIAGNOSIS — Z7902 Long term (current) use of antithrombotics/antiplatelets: Secondary | ICD-10-CM | POA: Diagnosis not present

## 2016-07-29 DIAGNOSIS — M170 Bilateral primary osteoarthritis of knee: Secondary | ICD-10-CM | POA: Diagnosis not present

## 2016-07-29 DIAGNOSIS — Z8673 Personal history of transient ischemic attack (TIA), and cerebral infarction without residual deficits: Secondary | ICD-10-CM | POA: Diagnosis not present

## 2016-07-29 DIAGNOSIS — F329 Major depressive disorder, single episode, unspecified: Secondary | ICD-10-CM | POA: Diagnosis not present

## 2016-07-29 DIAGNOSIS — Z9181 History of falling: Secondary | ICD-10-CM | POA: Diagnosis not present

## 2016-07-31 DIAGNOSIS — M170 Bilateral primary osteoarthritis of knee: Secondary | ICD-10-CM | POA: Diagnosis not present

## 2016-07-31 DIAGNOSIS — Z7902 Long term (current) use of antithrombotics/antiplatelets: Secondary | ICD-10-CM | POA: Diagnosis not present

## 2016-07-31 DIAGNOSIS — M545 Low back pain: Secondary | ICD-10-CM | POA: Diagnosis not present

## 2016-07-31 DIAGNOSIS — Z8673 Personal history of transient ischemic attack (TIA), and cerebral infarction without residual deficits: Secondary | ICD-10-CM | POA: Diagnosis not present

## 2016-07-31 DIAGNOSIS — F329 Major depressive disorder, single episode, unspecified: Secondary | ICD-10-CM | POA: Diagnosis not present

## 2016-07-31 DIAGNOSIS — Z9181 History of falling: Secondary | ICD-10-CM | POA: Diagnosis not present

## 2016-07-31 DIAGNOSIS — I10 Essential (primary) hypertension: Secondary | ICD-10-CM | POA: Diagnosis not present

## 2016-08-05 DIAGNOSIS — Z8673 Personal history of transient ischemic attack (TIA), and cerebral infarction without residual deficits: Secondary | ICD-10-CM | POA: Diagnosis not present

## 2016-08-05 DIAGNOSIS — Z7902 Long term (current) use of antithrombotics/antiplatelets: Secondary | ICD-10-CM | POA: Diagnosis not present

## 2016-08-05 DIAGNOSIS — M170 Bilateral primary osteoarthritis of knee: Secondary | ICD-10-CM | POA: Diagnosis not present

## 2016-08-05 DIAGNOSIS — F329 Major depressive disorder, single episode, unspecified: Secondary | ICD-10-CM | POA: Diagnosis not present

## 2016-08-05 DIAGNOSIS — M545 Low back pain: Secondary | ICD-10-CM | POA: Diagnosis not present

## 2016-08-05 DIAGNOSIS — Z9181 History of falling: Secondary | ICD-10-CM | POA: Diagnosis not present

## 2016-08-05 DIAGNOSIS — I10 Essential (primary) hypertension: Secondary | ICD-10-CM | POA: Diagnosis not present

## 2016-08-11 ENCOUNTER — Telehealth: Payer: Self-pay | Admitting: Neurology

## 2016-08-11 NOTE — Telephone Encounter (Signed)
Rn spoke with patient about her Depakote medication and the price. Pt stated now she pays 16.00 a month a [facility] pharmacy with the depakote. Pt stated [facility] pharmacy told her it will be cheaper to get medication mail order. Rn requested patient call [facility] to find out the process, the price. PT stated she has 10 days left of her depakote. Rn also pt to ask [employer] service if she will have the medication within 10 days. Pt will call back with her final answer.

## 2016-08-11 NOTE — Telephone Encounter (Signed)
Rn receive message to leave [facility] refill at [facility]. Pt will not be doing mail service per her request.

## 2016-08-11 NOTE — Telephone Encounter (Signed)
Patient called office in reference to divalproex (DEPAKOTE ER) 500 MG 24 hr tablet patients insurance company contacted her to see if she is able to get the prescription thru [facility] mail in order to save money.  Patient has enough medication at least for another week to 10 days.  Please call.  Patient advised Dr. Leonie Man will be out of the office this week.   [facility] contact phone #- [phone]

## 2016-08-11 NOTE — Telephone Encounter (Signed)
Pt said [facility] and forget about the [facility].

## 2016-08-19 ENCOUNTER — Other Ambulatory Visit: Payer: Self-pay | Admitting: Neurology

## 2016-08-20 ENCOUNTER — Other Ambulatory Visit: Payer: Self-pay

## 2016-08-20 MED ORDER — DIVALPROEX SODIUM ER 500 MG PO TB24: 500.0000 mg | ORAL_TABLET | Freq: Every day | ORAL | 3 refills | Status: DC

## 2016-09-23 DIAGNOSIS — M19011 Primary osteoarthritis, right shoulder: Secondary | ICD-10-CM | POA: Diagnosis not present

## 2016-09-23 DIAGNOSIS — M545 Low back pain: Secondary | ICD-10-CM | POA: Diagnosis not present

## 2016-09-23 DIAGNOSIS — M1712 Unilateral primary osteoarthritis, left knee: Secondary | ICD-10-CM | POA: Diagnosis not present

## 2016-09-23 DIAGNOSIS — M1711 Unilateral primary osteoarthritis, right knee: Secondary | ICD-10-CM | POA: Diagnosis not present

## 2016-09-30 DIAGNOSIS — M1711 Unilateral primary osteoarthritis, right knee: Secondary | ICD-10-CM | POA: Diagnosis not present

## 2016-09-30 DIAGNOSIS — M25512 Pain in left shoulder: Secondary | ICD-10-CM | POA: Diagnosis not present

## 2016-09-30 DIAGNOSIS — M1712 Unilateral primary osteoarthritis, left knee: Secondary | ICD-10-CM | POA: Diagnosis not present

## 2016-11-06 ENCOUNTER — Ambulatory Visit (INDEPENDENT_AMBULATORY_CARE_PROVIDER_SITE_OTHER): Payer: Medicare HMO | Admitting: Neurology

## 2016-11-06 ENCOUNTER — Encounter: Payer: Self-pay | Admitting: Neurology

## 2016-11-06 VITALS — BP 154/75 | HR 94 | Wt 130.0 lb

## 2016-11-06 DIAGNOSIS — R4789 Other speech disturbances: Secondary | ICD-10-CM | POA: Diagnosis not present

## 2016-11-06 MED ORDER — DIVALPROEX SODIUM ER 500 MG PO TB24: 500.0000 mg | ORAL_TABLET | Freq: Every day | ORAL | 3 refills | Status: DC

## 2016-11-06 NOTE — Progress Notes (Signed)
[facility]    Provider:  Dr Jaynee Eagles Referring Provider: Velna Hatchet, MD Primary Care Physician:  Velna Hatchet, MD  CC: Follow-up for TIA versus seizure versus metabolic encephalopathy  HPI: Danielle Bryan is a 83 y.o. female here as a follow-up  Interval History 03/13/2015: Today blood pressure is 220/90. She is asymptomatic. No more confusional episodes. She has been fine since her metabolic encephalopathy.She is still having crying spells, sometimes all day. She needs follow up for her high blood pressure. She is very emotional. She is sad frequently, she lives alone and she is lonely and she has her son, she goes out a few times a week but otherwise she stays at home, she doesn't have the finances to go into independent care, Can follow up with primary care going forward. She sleeps well most times. She watches TV.  4/7 days she cries and gets emotional and sad.  She cries today in the office, she says her son lost his job.   Interval update 08/22/2014: No more episodes. She is taking the plavix. No more episodes of alteration of awareness. Son is here with her. She is still having daily sadness and crying. Almost every day. Been going on for a few years and is worsening. She cries when her son leaves. Recently, it is worsening. She does want to get out more often and does not feel this has affected her interest and spending time with family and friends. She was on Zoloft years ago and she still cried. This appears to be a long-standing issue. Son provides a lot of the information today. Blood pressure is high again today in the office. He does not feel like she is depressed, but she is otherwise been quite emotional since he can remember. Repeat EEG in March 2016. Normal.  Update 05/07/2016 ; . Patient is seen today for first office follow-up visit following recent hospital admission in January 2018. She was admitted with a transient episode of speech and language  difficulties. On 03/20/16. She knew what she wanted to speak the words did not come out. Speech was also slurred. These symptoms are similar to her previous TIA. MRI scan the brain showed no acute infarct but showed old right MCA territory infarct and basal ganglia lacunar infarcts of old age. MRA of the brain showed advanced and recurrent orthostatic changes involving small to medium vessels with my distal basilar stenosis and left vertebral artery distal occlusion which was unchanged. Carotid Doppler showed no significant stenosis. Counts rose echo was normal. LDL cholesterol was elevated at 103 mg percent. Hemoglobin A1c was 5.4. Patient was started on Depakote ER for seizure prophylaxis even though the EEG on 03/20/16 was normal. She previously had been on Keppra but she discontinued it and when seen by Dr. Lavell Anchors in the office since she was doing well it was not reintroduced. Patient states she's done well since leaving the hospital. Should that no recurrent speech difficulties or any other TIA or strokelike symptoms. She is tolerating Depakote well but is a little concerned about her co-pay. She has kept a log of her blood pressure readings which is have ranged systolic from [id number]. She is also tolerated increasing the patella statin to 2 mg twice a week without significant myalgias or arthralgias. She continues to live alone and is independent in activities. Her son lives nearby and checks on her. She has given up driving. She does admit to feeling anxious. Update 11/06/2016 ; she returns for follow-up after last  visit 6 months ago. She continues to do well without any recurrent episodes of speech difficulties or any other stroke or TIA symptoms. She remains on Plavix which is tolerating well but does get easy bruising. She is had no bleeding episodes. She is also tolerating Depakote ER 500 mg well but wonders if she needs to stay on it. Complains of mild dizziness and gait imbalance but she is careful and  has not had any falls or injuries. She continues to live alone though her son lives in town and keeps a close watch on her. She has kept a diary of her blood pressure recordings and her systolics consistently range in the low 100 to high 130 range. Review of Systems: Patient complains of symptoms per HPI as well as the following symptoms:  shortness of breath, dizziness, abdominal pain, speech difficulty, depression, back pain. Pertinent negatives per HPI. All other systems negative.    Social History   Social History  . Marital status: Married    Spouse name: N/A  . Number of children: 2  . Years of education: 12   Occupational History  . Not on file.   Social History Main Topics  . Smoking status: Never Smoker  . Smokeless tobacco: Never Used  . Alcohol use No  . Drug use: No  . Sexual activity: Not on file   Other Topics Concern  . Not on file   Social History Narrative   Lives at home alone.    Has 2 children.   Caffeine: occasional     Family History  Problem Relation Age of Onset  . Heart failure Mother   . Pneumonia Mother   . Heart failure Father     Past Medical History:  Diagnosis Date  . Hypercholesterolemia   . Hypertension   . Hypothyroidism   . Osteoarthritis   . Reactive depression (situational)   . Stroke ([place])   . TIA (transient ischemic attack)    on plavix    Past Surgical History:  Procedure Laterality Date  . CATARACT EXTRACTION Left 1992  . CATARACT EXTRACTION Right 1995  . DEBRIDEMENT AND CLOSURE WOUND      Current Outpatient Prescriptions  Medication Sig Dispense Refill  . acetaminophen (TYLENOL) 325 MG tablet Take 325 mg by mouth every 6 (six) hours as needed for mild pain.     Marland Kitchen amLODipine (NORVASC) 5 MG tablet Take 5 mg by mouth daily.    . Calcium Carb-Cholecalciferol (CALCIUM 600/VITAMIN D3) 600-800 MG-UNIT TABS Take 1 tablet by mouth 2 (two) times daily.     . clopidogrel (PLAVIX) 75 MG tablet Take 1 tablet (75 mg total) by  mouth daily. 30 tablet 3  . divalproex (DEPAKOTE ER) 500 MG 24 hr tablet Take 1 tablet (500 mg total) by mouth daily. 90 tablet 3  . levothyroxine (SYNTHROID, LEVOTHROID) 100 MCG tablet TAKE 1 TABLET (100 MCG TOTAL) BY MOUTH DAILY. TAKING 6 DAYS A WEEK. DON'T TAKE ON SUNDAYS 90 tablet 0  . Multiple Vitamin (MULTIVITAMIN WITH MINERALS) TABS tablet Take 1 tablet by mouth daily.    . pantoprazole (PROTONIX) 40 MG tablet Take 40 mg by mouth daily.    . Pitavastatin Calcium (LIVALO) 2 MG TABS Take 1 tablet (2 mg total) by mouth once a week. Take 2 mg by mouth twice a week. 10 tablet 3  . Vitamins A & D (VITAMIN A & D) 10000-400 units TABS Use as directed 1,000 Units in the mouth or throat daily.  No current facility-administered medications for this visit.     Allergies as of 11/06/2016 - Review Complete 11/06/2016  Allergen Reaction Noted  . Escitalopram oxalate Nausea Only 08/16/2010  . Lisinopril Other (See Comments) 11/19/2010  . Statins  08/16/2010  . Sulfa antibiotics  06/19/2011    Vitals: BP (!) 154/75 (BP Location: Right Arm, Patient Position: Sitting, Cuff Size: Small)   Pulse 94   Wt 130 lb (59 kg)   BMI 24.56 kg/m  Last Weight:  Wt Readings from Last 1 Encounters:  11/06/16 130 lb (59 kg)   Last Height:   Ht Readings from Last 1 Encounters:  03/20/16 5\' 1"  (1.549 m)     Physical Exam: Pleasant anxious looking elderly Caucasian lady not in distress. Petite kyphoscoliosis. Multiple ecchymosis on the skin  . Afebrile. Head is nontraumatic. Neck is supple without bruit.    Cardiac exam no murmur or gallop. Lungs are clear to auscultation. Distal pulses are well felt. Neurological Exam : Awake alert oriented 3. Speech:  No aphasia or dysarthria  Cognition:  The patient is oriented to person, place, and time; diminished recall 2/3. Follows two-step commands   Cranial Nerves:  The pupils are equal, round, and reactive to light. Visual fields are full to  finger confrontation. Extraocular movements are intact. Trigeminal sensation is intact and the muscles of mastication are normal. The face is symmetric. The palate elevates in the midline. Hearing grossly intact. Voice is normal. Shoulder shrug is normal. The tongue has normal motion without fasciculations.    Gait:  Steady. No ataxia Strength:  Strength is V/V in the upper and lower limbs.  Motor Observation:  No asymmetry, no atrophy, and no involuntary movements noted. Mild fine action tremors both outstretched upper extremities likely from anxiety Tone:  Normal muscle tone.   Posture:  Mild kyphosis       Assessment/Plan: 83 y.o. female admitted for recurrent episodes of isolated non fluent language without other abnormalities on neuro-exam and CT brain without acute abnormality.Etiology is indeterminate simple partial seizures versus TIAs fromage-related ,amyloid angiopathy  Stroke workup showed no acute pathology on MRi brain. EEG abnormal while inpatient likely due to metabolic encephalopathy and repeat EEG negative.  Mild underlying chronic anxiety    I had a long discussion with the patient regarding her episodes of transient speech difficulties and she seems to be doing well on the current medication regimen. Continue Plavix for stroke prevention with strict control of hypertension with blood pressure goal below 130/90 and lipids with LDL cholesterol goal below 70 mg percent. Continue Depakote ER 500 mg daily. And she was given renewed prescriptions for  a year. I advised the patient to see her primary physician to discuss treatment options for her depression. She will return for follow-up in a year or call earlier if necessary... Greater than 50% time during this 30 minute visit was spent on counseling and coordination of care about TIA, seizure, anxiety and answering questions She will return for follow-up in 6 months or call earlier if necessary.   Antony Contras, MD  [facility] [address] [place], [place] [address]  Phone [phone] Fax [phone]  A total of 30 minutes was spent face-to-face with this patient. Over half this time was spent on counseling patient on the depression diagnosis and different diagnostic and therapeutic options available.

## 2016-11-06 NOTE — Patient Instructions (Signed)
I had a long discussion with the patient regarding her episodes of transient speech difficulties and she seems to be doing well on the current medication regimen. Continue Plavix for stroke prevention with strict control of hypertension with blood pressure goal below 130/90 and lipids with LDL cholesterol goal below 70 mg percent. Continue Depakote ER 500 mg daily. And she was given renewed prescriptions for  a year. I advised the patient to see her primary physician to discuss treatment options for her depression. She will return for follow-up in a year or call earlier if necessary.

## 2016-12-14 ENCOUNTER — Ambulatory Visit (HOSPITAL_COMMUNITY)
Admission: EM | Admit: 2016-12-14 | Discharge: 2016-12-14 | Disposition: A | Discharge status: Home / Self Care | Payer: Medicare HMO | Attending: Emergency Medicine | Admitting: Emergency Medicine

## 2016-12-14 ENCOUNTER — Encounter (HOSPITAL_COMMUNITY): Payer: Self-pay | Admitting: Emergency Medicine

## 2016-12-14 DIAGNOSIS — H8110 Benign paroxysmal vertigo, unspecified ear: Secondary | ICD-10-CM | POA: Diagnosis not present

## 2016-12-14 DIAGNOSIS — I10 Essential (primary) hypertension: Secondary | ICD-10-CM

## 2016-12-14 NOTE — ED Triage Notes (Signed)
Pt c/o high blood pressure spikes with some vertigo. Pt denies hx of vertigo. Pt c/o dizziness and unstable blood pressure since Wednesday. Pt called PCP and they instructed her to take another pill for her BP then. Pt states it doesn't help. Pt c/o feeling shaky. Denies feeling dizzy at this time. No neuro deficits at this time.

## 2016-12-14 NOTE — ED Triage Notes (Signed)
Pt c/o several falls on Wednesday. Pt denies hitting head. PT also has hx of TIA, is on plavix.

## 2016-12-14 NOTE — ED Provider Notes (Signed)
[facility]    CSN: [id number] Arrival date & time: 12/14/16  1200     History   Chief Complaint Chief Complaint  Patient presents with  . Hypertension  . Dizziness    HPI Danielle Bryan is a 83 y.o. female.   83 year old female with history of CVA, TIA, hypothyroidism, hypertension states that she has been concerned because her blood pressure is been going up and down. She states last night she taken her blood pressure several times. At 3 AM 518 systolic and pulse 841. Later it was 165/71, the next one was 660 systolic, later on 630 systolic after rest.she was basically asymptomatic with these blood pressure readings.No syncope. This is been occurring over the past several days and she called her physician on Friday at 5:00 and was told to take 2 of her blood pressure medicines only one day.  Second complaint is that of vertigo. She states when she is lying down she has a minute or 2 of vertigo, room spinning. When she turns her head right to left left or right she often develops vertigo and when she changes position from lying or sitting to standing she will develop vertigo for short of time. He seemed to be self-limited. Denies focal paresthesias or weakness. Denies headache. Denies chest pain or shortness of breath.  Also mentions the fact that she fell backwards while sitting down the commode she struck the left part of her lower back and has a large area of ecchymosis from the mid axillary line to be on the posterior axillary line. Minor tenderness the associated ribs.      Past Medical History:  Diagnosis Date  . Hypercholesterolemia   . Hypertension   . Hypothyroidism   . Osteoarthritis   . Reactive depression (situational)   . Stroke ([place])   . TIA (transient ischemic attack)    on plavix    Patient Active Problem List   Diagnosis Date Noted  . Aphasia 03/20/2016  . HTN (hypertension) 03/20/2016  . Weakness 03/18/2015  . Acute kidney injury ([place])  03/18/2015  . Dehydration 03/18/2015  . Nausea and vomiting 03/18/2015  . Thrombocytopenia ([place]) 03/18/2015  . Moderate nausea and vomiting 03/18/2015  . Elevated lactic acid level 03/18/2015  . Elevated troponin 03/18/2015  . Encephalopathy, metabolic 16/02/930  . UTI (urinary tract infection) 08/22/2014  . TIA (transient ischemic attack) 03/19/2014  . Benign hypertensive heart disease without heart failure 08/20/2010  . Hypothyroidism   . Osteoarthritis   . Hypercholesterolemia   . Reactive depression (situational)     Past Surgical History:  Procedure Laterality Date  . CATARACT EXTRACTION Left 1992  . CATARACT EXTRACTION Right 1995  . DEBRIDEMENT AND CLOSURE WOUND      OB History    No data available       Home Medications    Prior to Admission medications   Medication Sig Start Date End Date Taking? Authorizing Provider  acetaminophen (TYLENOL) 325 MG tablet Take 325 mg by mouth every 6 (six) hours as needed for mild pain.  01/09/15  Yes [provider]  amLODipine (NORVASC) 5 MG tablet Take 5 mg by mouth daily. 03/18/16  Yes [provider]  Calcium Carb-Cholecalciferol (CALCIUM 600/VITAMIN D3) 600-800 MG-UNIT TABS Take 1 tablet by mouth 2 (two) times daily.    Yes [provider]  clopidogrel (PLAVIX) 75 MG tablet Take 1 tablet (75 mg total) by mouth daily. 03/21/14  Yes Velna Hatchet, MD  divalproex (DEPAKOTE ER)  500 MG 24 hr tablet Take 1 tablet (500 mg total) by mouth daily. 11/06/16  Yes Garvin Fila, MD  levothyroxine (SYNTHROID, LEVOTHROID) 100 MCG tablet TAKE 1 TABLET (100 MCG TOTAL) BY MOUTH DAILY. TAKING 6 DAYS A WEEK. DON'T TAKE ON SUNDAYS   Yes Darlin Coco, MD  Multiple Vitamin (MULTIVITAMIN WITH MINERALS) TABS tablet Take 1 tablet by mouth daily.   Yes [provider]  pantoprazole (PROTONIX) 40 MG tablet Take 40 mg by mouth daily.   Yes [provider]  Pitavastatin Calcium (LIVALO) 2 MG TABS Take 1  tablet (2 mg total) by mouth once a week. Take 2 mg by mouth twice a week. 03/21/16  Yes Eugenie Filler, MD  Vitamins A & D (VITAMIN A & D) 10000-400 units TABS Use as directed 1,000 Units in the mouth or throat daily. 03/05/16  Yes [provider]    Family History Family History  Problem Relation Age of Onset  . Heart failure Mother   . Pneumonia Mother   . Heart failure Father     Social History Social History  Substance Use Topics  . Smoking status: Never Smoker  . Smokeless tobacco: Never Used  . Alcohol use No     Allergies   Escitalopram oxalate; Lisinopril; Statins; and Sulfa antibiotics   Review of Systems Review of Systems  Constitutional: Negative for appetite change, fatigue and fever.  HENT: Negative.   Eyes: Negative.   Respiratory: Negative for cough and shortness of breath.   Cardiovascular: Positive for palpitations. Negative for chest pain and leg swelling.  Gastrointestinal: Negative.   Genitourinary: Negative.   Musculoskeletal:       Left posterior lateral lower rib pain  Neurological: Positive for dizziness. Negative for tremors, seizures, syncope, speech difficulty, numbness and headaches.  Hematological: Bruises/bleeds easily.  All other systems reviewed and are negative.    Physical Exam Triage Vital Signs ED Triage Vitals [12/14/16 1217]  Enc Vitals Group     BP (!) 148/97     Pulse Rate 100     Resp 20     Temp 97.9 F (36.6 C)     Temp Source Oral     SpO2 100 %     Weight      Height      Head Circumference      Peak Flow      Pain Score      Pain Loc      Pain Edu?      Excl. in [place]?    Orthostatic VS for the past 24 hrs:  BP- Lying Pulse- Lying BP- Sitting Pulse- Sitting BP- Standing at 0 minutes Pulse- Standing at 0 minutes  12/14/16 1250 155/80 96 151/80 92 145/85 100    Updated Vital Signs BP (!) 148/97   Pulse 100   Temp 97.9 F (36.6 C) (Oral)   Resp 20   SpO2 100%   Visual Acuity Right Eye  Distance:   Left Eye Distance:   Bilateral Distance:    Right Eye Near:   Left Eye Near:    Bilateral Near:     Physical Exam  Constitutional: She is oriented to person, place, and time. She appears well-developed. No distress.  Eyes: EOM are normal.  Neck: Neck supple.  Cardiovascular: Normal rate, regular rhythm, normal heart sounds and intact distal pulses.   Frequent premature beats approximately 10-12/m.  Pulmonary/Chest: Effort normal and breath sounds normal. No respiratory distress. She has no wheezes.  She has no rales.  Musculoskeletal: Normal range of motion. She exhibits no edema or deformity.  Neurological: She is alert and oriented to person, place, and time. She exhibits normal muscle tone.  Skin: Skin is warm and dry. Capillary refill takes less than 2 seconds.  Psychiatric: She has a normal mood and affect.  Nursing note and vitals reviewed.    UC Treatments / Results  Labs (all labs ordered are listed, but only abnormal results are displayed) Labs Reviewed - No data to display  EKG  EKG Interpretation None       Radiology No results found.  Procedures Procedures (including critical care time)  Medications Ordered in UC Medications - No data to display   Initial Impression / Assessment and Plan / UC Course  I have reviewed the triage vital signs and the nursing notes.  Pertinent labs & imaging results that were available during my care of the patient were reviewed by me and considered in my medical decision making (see chart for details).    Your blood pressures are a little elevated but not in a dangerous range. Your heart rate is only upper limits of normal. Call your primary care provider tomorrow for an appointment to discuss medication options that will help bring her blood pressure down and maybe decrease her heart rate such as a beta blocker.Since her vertigo is so short lived and relatively infrequent I hesitate to place she wants a medicine  that can cause drowsiness or increased chances of fall. Try to move slowly and get up slowly. Continue taking your regular medications. If you develop headaches, problems with vision, speech, weakness on one side of the body, any type of chest congestion, shortness of breath, fevers or chills then call 911 or go to the emergency department promptly.     Final Clinical Impressions(s) / UC Diagnoses   Final diagnoses:  Benign paroxysmal positional vertigo, unspecified laterality  Essential hypertension    New Prescriptions New Prescriptions   No medications on file     Controlled Substance Prescriptions [place] Controlled Substance Registry consulted? Not Applicable   Janne Napoleon, NP 12/14/16 1328

## 2016-12-14 NOTE — Discharge Instructions (Signed)
Your blood pressures are a little elevated but not in a dangerous range. Your heart rate is only upper limits of normal. Call your primary care provider tomorrow for an appointment to discuss medication options that will help bring her blood pressure down and maybe decrease her heart rate such as a beta blocker.Since her vertigo is so short lived and relatively infrequent I hesitate to place she wants a medicine that can cause drowsiness or increased chances of fall. Try to move slowly and get up slowly. Continue taking your regular medications. If you develop headaches, problems with vision, speech, weakness on one side of the body, any type of chest congestion, shortness of breath, fevers or chills then call 911 or go to the emergency department promptly.

## 2017-01-02 DIAGNOSIS — R7309 Other abnormal glucose: Secondary | ICD-10-CM | POA: Diagnosis not present

## 2017-01-02 DIAGNOSIS — E038 Other specified hypothyroidism: Secondary | ICD-10-CM | POA: Diagnosis not present

## 2017-01-02 DIAGNOSIS — R82998 Other abnormal findings in urine: Secondary | ICD-10-CM | POA: Diagnosis not present

## 2017-01-02 DIAGNOSIS — M810 Age-related osteoporosis without current pathological fracture: Secondary | ICD-10-CM | POA: Diagnosis not present

## 2017-01-02 DIAGNOSIS — E7849 Other hyperlipidemia: Secondary | ICD-10-CM | POA: Diagnosis not present

## 2017-01-02 DIAGNOSIS — I10 Essential (primary) hypertension: Secondary | ICD-10-CM | POA: Diagnosis not present

## 2017-01-09 DIAGNOSIS — Z0000 Encounter for general adult medical examination without abnormal findings: Secondary | ICD-10-CM | POA: Diagnosis not present

## 2017-01-09 DIAGNOSIS — G458 Other transient cerebral ischemic attacks and related syndromes: Secondary | ICD-10-CM | POA: Diagnosis not present

## 2017-01-09 DIAGNOSIS — Z8669 Personal history of other diseases of the nervous system and sense organs: Secondary | ICD-10-CM | POA: Diagnosis not present

## 2017-01-09 DIAGNOSIS — M9987 Other biomechanical lesions of upper extremity: Secondary | ICD-10-CM | POA: Diagnosis not present

## 2017-01-09 DIAGNOSIS — F331 Major depressive disorder, recurrent, moderate: Secondary | ICD-10-CM | POA: Diagnosis not present

## 2017-01-09 DIAGNOSIS — R296 Repeated falls: Secondary | ICD-10-CM | POA: Diagnosis not present

## 2017-01-09 DIAGNOSIS — M810 Age-related osteoporosis without current pathological fracture: Secondary | ICD-10-CM | POA: Diagnosis not present

## 2017-01-09 DIAGNOSIS — R7309 Other abnormal glucose: Secondary | ICD-10-CM | POA: Diagnosis not present

## 2017-01-09 DIAGNOSIS — D692 Other nonthrombocytopenic purpura: Secondary | ICD-10-CM | POA: Diagnosis not present

## 2017-01-09 DIAGNOSIS — Z23 Encounter for immunization: Secondary | ICD-10-CM | POA: Diagnosis not present

## 2017-01-16 DIAGNOSIS — Z1212 Encounter for screening for malignant neoplasm of rectum: Secondary | ICD-10-CM | POA: Diagnosis not present

## 2017-01-22 DIAGNOSIS — M810 Age-related osteoporosis without current pathological fracture: Secondary | ICD-10-CM | POA: Diagnosis not present

## 2017-01-29 DIAGNOSIS — D485 Neoplasm of uncertain behavior of skin: Secondary | ICD-10-CM | POA: Diagnosis not present

## 2017-01-29 DIAGNOSIS — C44619 Basal cell carcinoma of skin of left upper limb, including shoulder: Secondary | ICD-10-CM | POA: Diagnosis not present

## 2017-01-29 DIAGNOSIS — L821 Other seborrheic keratosis: Secondary | ICD-10-CM | POA: Diagnosis not present

## 2017-01-29 DIAGNOSIS — Z85828 Personal history of other malignant neoplasm of skin: Secondary | ICD-10-CM | POA: Diagnosis not present

## 2017-04-16 DIAGNOSIS — Z6823 Body mass index (BMI) 23.0-23.9, adult: Secondary | ICD-10-CM | POA: Diagnosis not present

## 2017-04-16 DIAGNOSIS — M810 Age-related osteoporosis without current pathological fracture: Secondary | ICD-10-CM | POA: Diagnosis not present

## 2017-04-16 DIAGNOSIS — I10 Essential (primary) hypertension: Secondary | ICD-10-CM | POA: Diagnosis not present

## 2017-04-18 DIAGNOSIS — M1712 Unilateral primary osteoarthritis, left knee: Secondary | ICD-10-CM | POA: Diagnosis not present

## 2017-04-18 DIAGNOSIS — M19011 Primary osteoarthritis, right shoulder: Secondary | ICD-10-CM | POA: Diagnosis not present

## 2017-04-18 DIAGNOSIS — M1711 Unilateral primary osteoarthritis, right knee: Secondary | ICD-10-CM | POA: Diagnosis not present

## 2017-04-27 ENCOUNTER — Encounter (HOSPITAL_COMMUNITY): Payer: Medicare HMO

## 2017-05-20 ENCOUNTER — Encounter: Payer: Self-pay | Admitting: Nurse Practitioner

## 2017-05-23 DIAGNOSIS — M170 Bilateral primary osteoarthritis of knee: Secondary | ICD-10-CM | POA: Diagnosis not present

## 2017-05-23 DIAGNOSIS — M19011 Primary osteoarthritis, right shoulder: Secondary | ICD-10-CM | POA: Diagnosis not present

## 2017-08-13 DIAGNOSIS — M25561 Pain in right knee: Secondary | ICD-10-CM | POA: Diagnosis not present

## 2017-08-13 DIAGNOSIS — Z8669 Personal history of other diseases of the nervous system and sense organs: Secondary | ICD-10-CM | POA: Diagnosis not present

## 2017-08-13 DIAGNOSIS — R569 Unspecified convulsions: Secondary | ICD-10-CM | POA: Diagnosis not present

## 2017-08-13 DIAGNOSIS — R279 Unspecified lack of coordination: Secondary | ICD-10-CM | POA: Diagnosis not present

## 2017-08-13 DIAGNOSIS — I10 Essential (primary) hypertension: Secondary | ICD-10-CM | POA: Diagnosis not present

## 2017-08-13 DIAGNOSIS — R1013 Epigastric pain: Secondary | ICD-10-CM | POA: Diagnosis not present

## 2017-08-13 DIAGNOSIS — E038 Other specified hypothyroidism: Secondary | ICD-10-CM | POA: Diagnosis not present

## 2017-08-13 DIAGNOSIS — K5909 Other constipation: Secondary | ICD-10-CM | POA: Diagnosis not present

## 2017-08-13 DIAGNOSIS — M25562 Pain in left knee: Secondary | ICD-10-CM | POA: Diagnosis not present

## 2017-08-13 DIAGNOSIS — R600 Localized edema: Secondary | ICD-10-CM | POA: Diagnosis not present

## 2017-08-20 DIAGNOSIS — R1013 Epigastric pain: Secondary | ICD-10-CM | POA: Diagnosis not present

## 2017-08-22 DIAGNOSIS — M25511 Pain in right shoulder: Secondary | ICD-10-CM | POA: Diagnosis not present

## 2017-08-22 DIAGNOSIS — M1712 Unilateral primary osteoarthritis, left knee: Secondary | ICD-10-CM | POA: Diagnosis not present

## 2017-09-04 DIAGNOSIS — M1712 Unilateral primary osteoarthritis, left knee: Secondary | ICD-10-CM | POA: Diagnosis not present

## 2017-09-04 DIAGNOSIS — M1711 Unilateral primary osteoarthritis, right knee: Secondary | ICD-10-CM | POA: Diagnosis not present

## 2017-10-31 ENCOUNTER — Other Ambulatory Visit: Payer: Self-pay | Admitting: Neurology

## 2017-11-02 ENCOUNTER — Other Ambulatory Visit: Payer: Self-pay

## 2017-11-02 MED ORDER — DIVALPROEX SODIUM ER 500 MG PO TB24: 500.0000 mg | ORAL_TABLET | Freq: Every day | ORAL | 0 refills | Status: DC

## 2017-11-06 ENCOUNTER — Ambulatory Visit: Payer: Medicare HMO | Admitting: Nurse Practitioner

## 2017-12-12 DIAGNOSIS — M25511 Pain in right shoulder: Secondary | ICD-10-CM | POA: Diagnosis not present

## 2017-12-12 DIAGNOSIS — M1712 Unilateral primary osteoarthritis, left knee: Secondary | ICD-10-CM | POA: Diagnosis not present

## 2018-01-02 DIAGNOSIS — M1711 Unilateral primary osteoarthritis, right knee: Secondary | ICD-10-CM | POA: Diagnosis not present

## 2018-01-02 DIAGNOSIS — M19012 Primary osteoarthritis, left shoulder: Secondary | ICD-10-CM | POA: Diagnosis not present

## 2018-01-20 DIAGNOSIS — E038 Other specified hypothyroidism: Secondary | ICD-10-CM | POA: Diagnosis not present

## 2018-01-20 DIAGNOSIS — M810 Age-related osteoporosis without current pathological fracture: Secondary | ICD-10-CM | POA: Diagnosis not present

## 2018-01-20 DIAGNOSIS — E7849 Other hyperlipidemia: Secondary | ICD-10-CM | POA: Diagnosis not present

## 2018-01-20 DIAGNOSIS — R7309 Other abnormal glucose: Secondary | ICD-10-CM | POA: Diagnosis not present

## 2018-01-26 ENCOUNTER — Telehealth: Payer: Self-pay | Admitting: Neurology

## 2018-01-26 NOTE — Telephone Encounter (Signed)
Patient was last seen 10/2016. Call patient to state an appt is needed to continue refills. Pt has been stable on the medication. Rn schedule her with Janett Billow NP. Pt is aware of the appt time and check in time. Pt states she can only come on thursdays due to transportation issues.

## 2018-01-28 DIAGNOSIS — E7849 Other hyperlipidemia: Secondary | ICD-10-CM | POA: Diagnosis not present

## 2018-01-28 DIAGNOSIS — Z0000 Encounter for general adult medical examination without abnormal findings: Secondary | ICD-10-CM | POA: Diagnosis not present

## 2018-01-28 DIAGNOSIS — D692 Other nonthrombocytopenic purpura: Secondary | ICD-10-CM | POA: Diagnosis not present

## 2018-01-28 DIAGNOSIS — Z1389 Encounter for screening for other disorder: Secondary | ICD-10-CM | POA: Diagnosis not present

## 2018-01-28 DIAGNOSIS — M810 Age-related osteoporosis without current pathological fracture: Secondary | ICD-10-CM | POA: Diagnosis not present

## 2018-01-28 DIAGNOSIS — E038 Other specified hypothyroidism: Secondary | ICD-10-CM | POA: Diagnosis not present

## 2018-01-28 DIAGNOSIS — F331 Major depressive disorder, recurrent, moderate: Secondary | ICD-10-CM | POA: Diagnosis not present

## 2018-01-28 DIAGNOSIS — M1990 Unspecified osteoarthritis, unspecified site: Secondary | ICD-10-CM | POA: Diagnosis not present

## 2018-01-28 DIAGNOSIS — Z23 Encounter for immunization: Secondary | ICD-10-CM | POA: Diagnosis not present

## 2018-01-28 DIAGNOSIS — R82998 Other abnormal findings in urine: Secondary | ICD-10-CM | POA: Diagnosis not present

## 2018-01-28 DIAGNOSIS — I10 Essential (primary) hypertension: Secondary | ICD-10-CM | POA: Diagnosis not present

## 2018-02-03 NOTE — Telephone Encounter (Signed)
Noted! Thank you

## 2018-02-11 ENCOUNTER — Encounter: Payer: Self-pay | Admitting: Adult Health

## 2018-02-11 ENCOUNTER — Ambulatory Visit: Payer: Medicare HMO | Admitting: Adult Health

## 2018-02-11 VITALS — BP 171/84 | HR 101 | Wt 135.4 lb

## 2018-02-11 DIAGNOSIS — G459 Transient cerebral ischemic attack, unspecified: Secondary | ICD-10-CM | POA: Diagnosis not present

## 2018-02-11 DIAGNOSIS — E7800 Pure hypercholesterolemia, unspecified: Secondary | ICD-10-CM | POA: Diagnosis not present

## 2018-02-11 DIAGNOSIS — I10 Essential (primary) hypertension: Secondary | ICD-10-CM | POA: Diagnosis not present

## 2018-02-11 MED ORDER — DIVALPROEX SODIUM ER 500 MG PO TB24: 500.0000 mg | ORAL_TABLET | Freq: Every day | ORAL | 3 refills | Status: DC

## 2018-02-11 NOTE — Patient Instructions (Addendum)
Continue clopidogrel 75 mg daily  and pitavastatin  for secondary stroke prevention  Continue Depakote ER 500mg  nightly - refill provided   Continue to follow up with PCP regarding cholesterol and blood pressure management   Continue to monitor blood pressure at home  Maintain strict control of hypertension with blood pressure goal below 130/90, diabetes with hemoglobin A1c goal below 6.5% and cholesterol with LDL cholesterol (bad cholesterol) goal below 70 mg/dL. I also advised the patient to eat a healthy diet with plenty of whole grains, cereals, fruits and vegetables, exercise regularly and maintain ideal body weight.  Followup in the future with me in 1 year or call earlier if needed       Thank you for coming to see [place] at [facility]. I hope we have been able to provide you high quality care today.  You may receive a patient satisfaction survey over the next few weeks. We would appreciate your feedback and comments so that we may continue to improve ourselves and the health of our patients.

## 2018-02-11 NOTE — Progress Notes (Signed)
[facility]    Provider:  Dr Jaynee Eagles Referring Provider: Velna Hatchet, MD Primary Care Physician:  Velna Hatchet, MD  CC: Follow-up for TIA versus seizure versus metabolic encephalopathy  HPI: Danielle Bryan is a 84 y.o. female here as a follow-up  Interval History 03/13/2015: Today blood pressure is 220/90. She is asymptomatic. No more confusional episodes. She has been fine since her metabolic encephalopathy.She is still having crying spells, sometimes all day. She needs follow up for her high blood pressure. She is very emotional. She is sad frequently, she lives alone and she is lonely and she has her son, she goes out a few times a week but otherwise she stays at home, she doesn't have the finances to go into independent care, Can follow up with primary care going forward. She sleeps well most times. She watches TV.  4/7 days she cries and gets emotional and sad.  She cries today in the office, she says her son lost his job.   Interval update 08/22/2014: No more episodes. She is taking the plavix. No more episodes of alteration of awareness. Son is here with her. She is still having daily sadness and crying. Almost every day. Been going on for a few years and is worsening. She cries when her son leaves. Recently, it is worsening. She does want to get out more often and does not feel this has affected her interest and spending time with family and friends. She was on Zoloft years ago and she still cried. This appears to be a long-standing issue. Son provides a lot of the information today. Blood pressure is high again today in the office. He does not feel like she is depressed, but she is otherwise been quite emotional since he can remember. Repeat EEG in March 2016. Normal.  Update 05/07/2016 ; . Patient is seen today for first office follow-up visit following recent hospital admission in January 2018. She was admitted with a transient episode of speech and language  difficulties. On 03/20/16. She knew what she wanted to speak the words did not come out. Speech was also slurred. These symptoms are similar to her previous TIA. MRI scan the brain showed no acute infarct but showed old right MCA territory infarct and basal ganglia lacunar infarcts of old age. MRA of the brain showed advanced and recurrent orthostatic changes involving small to medium vessels with my distal basilar stenosis and left vertebral artery distal occlusion which was unchanged. Carotid Doppler showed no significant stenosis. Counts rose echo was normal. LDL cholesterol was elevated at 103 mg percent. Hemoglobin A1c was 5.4. Patient was started on Depakote ER for seizure prophylaxis even though the EEG on 03/20/16 was normal. She previously had been on Keppra but she discontinued it and when seen by Dr. Lavell Anchors in the office since she was doing well it was not reintroduced. Patient states she's done well since leaving the hospital. Should that no recurrent speech difficulties or any other TIA or strokelike symptoms. She is tolerating Depakote well but is a little concerned about her co-pay. She has kept a log of her blood pressure readings which is have ranged systolic from [id number]. She is also tolerated increasing the patella statin to 2 mg twice a week without significant myalgias or arthralgias. She continues to live alone and is independent in activities. Her son lives nearby and checks on her. She has given up driving. She does admit to feeling anxious. Update 11/06/2016 PS ; she returns for follow-up after  last visit 6 months ago. She continues to do well without any recurrent episodes of speech difficulties or any other stroke or TIA symptoms. She remains on Plavix which is tolerating well but does get easy bruising. She is had no bleeding episodes. She is also tolerating Depakote ER 500 mg well but wonders if she needs to stay on it. Complains of mild dizziness and gait imbalance but she is careful and  has not had any falls or injuries. She continues to live alone though her son lives in town and keeps a close watch on her. She has kept a diary of her blood pressure recordings and her systolics consistently range in the low 100 to high 130 range.  Interval history 02/11/2018: Patient returns today for medication refill of Depakote.  She is accompanied by her friend.  She has continued on Depakote well without reported side effects.  She continues on Depakote well without reported side effects.  She denies any additional TIA versus seizure symptoms.  She does report issues with insomnia that have been occurring for "years" and is questioning whether this could be due to her Depakote as she takes her Depakote around 10 PM the majority of the nights is unable to fall asleep until around 3 AM.  She endorses good sleep hygiene and denies daytime napping.  She has continued on Plavix without side effects of bleeding or bruising.  Continues on pitavastatin without side effects of myalgias.  Blood pressure today elevated per patient 171/84.  Patient does state that she monitors at home and typically range SBP 140-150 and typically is elevated when she is seen at doctor's appointments.  No further concerns at this time.    Review of Systems: Patient complains of symptoms per HPI as well as the following symptoms: Shortness of breath, abdominal pain, joint pain and depression pertinent negatives per HPI. All other systems negative.      Social History   Socioeconomic History  . Marital status: Married    Spouse name: Not on file  . Number of children: 2  . Years of education: 62  . Highest education level: Not on file  Occupational History  . Not on file  Social Needs  . Financial resource strain: Not on file  . Food insecurity:    Worry: Not on file    Inability: Not on file  . Transportation needs:    Medical: Not on file    Non-medical: Not on file  Tobacco Use  . Smoking status: Never Smoker    . Smokeless tobacco: Never Used  Substance and Sexual Activity  . Alcohol use: No    Alcohol/week: 0.0 standard drinks  . Drug use: No  . Sexual activity: Not on file  Lifestyle  . Physical activity:    Days per week: Not on file    Minutes per session: Not on file  . Stress: Not on file  Relationships  . Social connections:    Talks on phone: Not on file    Gets together: Not on file    Attends religious service: Not on file    Active member of club or organization: Not on file    Attends meetings of clubs or organizations: Not on file    Relationship status: Not on file  . Intimate partner violence:    Fear of current or ex partner: Not on file    Emotionally abused: Not on file    Physically abused: Not on file    Forced  sexual activity: Not on file  Other Topics Concern  . Not on file  Social History Narrative   Lives at home alone.    Has 2 children.   Caffeine: occasional     Family History  Problem Relation Age of Onset  . Heart failure Mother   . Pneumonia Mother   . Heart failure Father     Past Medical History:  Diagnosis Date  . Hypercholesterolemia   . Hypertension   . Hypothyroidism   . Osteoarthritis   . Reactive depression (situational)   . Stroke ([place])   . TIA (transient ischemic attack)    on plavix    Past Surgical History:  Procedure Laterality Date  . CATARACT EXTRACTION Left 1992  . CATARACT EXTRACTION Right 1995  . DEBRIDEMENT AND CLOSURE WOUND      Current Outpatient Medications  Medication Sig Dispense Refill  . acetaminophen (TYLENOL) 325 MG tablet Take 325 mg by mouth every 6 (six) hours as needed for mild pain.     Marland Kitchen amLODipine (NORVASC) 5 MG tablet Take 5 mg by mouth daily.    . Calcium Carb-Cholecalciferol (CALCIUM 600/VITAMIN D3) 600-800 MG-UNIT TABS Take 1 tablet by mouth 2 (two) times daily.     . clopidogrel (PLAVIX) 75 MG tablet Take 1 tablet (75 mg total) by mouth daily. 30 tablet 3  . divalproex (DEPAKOTE ER) 500 MG  24 hr tablet TAKE 1 TABLET BY MOUTH EVERY DAY 30 tablet 0  . levothyroxine (SYNTHROID, LEVOTHROID) 100 MCG tablet TAKE 1 TABLET (100 MCG TOTAL) BY MOUTH DAILY. TAKING 6 DAYS A WEEK. DON'T TAKE ON SUNDAYS 90 tablet 0  . Multiple Vitamin (MULTIVITAMIN WITH MINERALS) TABS tablet Take 1 tablet by mouth daily.    . pantoprazole (PROTONIX) 40 MG tablet Take 40 mg by mouth daily.    . Pitavastatin Calcium (LIVALO) 2 MG TABS Take 1 tablet (2 mg total) by mouth once a week. Take 2 mg by mouth twice a week. 10 tablet 3  . Vitamins A & D (VITAMIN A & D) 10000-400 units TABS Use as directed 1,000 Units in the mouth or throat daily.     No current facility-administered medications for this visit.     Allergies as of 02/11/2018 - Review Complete 12/14/2016  Allergen Reaction Noted  . Escitalopram oxalate Nausea Only 08/16/2010  . Lisinopril Other (See Comments) 11/19/2010  . Statins  08/16/2010  . Sulfa antibiotics  06/19/2011    Vitals: There were no vitals taken for this visit. Last Weight:  Wt Readings from Last 1 Encounters:  11/06/16 130 lb (59 kg)   Last Height:   Ht Readings from Last 1 Encounters:  03/20/16 5\' 1"  (1.549 m)     Physical Exam: Pleasant anxious looking elderly Caucasian lady not in distress. Petite kyphoscoliosis. Head is nontraumatic. Neck is supple without bruit.    Cardiac exam no murmur or gallop. Lungs are clear to auscultation. Distal pulses are well felt. Neurological Exam : Awake alert oriented 3. Speech:  No aphasia or dysarthria  Cognition:  The patient is oriented to person, place, and time; diminished recall 2/3. Follows two-step commands   Cranial Nerves:  The pupils are equal, round, and reactive to light. Visual fields are full to finger confrontation. Extraocular movements are intact. Trigeminal sensation is intact and the muscles of mastication are normal. The face is symmetric. The palate elevates in the midline. Hearing grossly intact.  Voice is normal. Shoulder shrug is normal. The tongue has normal  motion without fasciculations.   Gait:  Steady with assistance of cane. No ataxia Strength:  Strength is equal in all tested extremities Motor Observation:  No asymmetry, no atrophy, and no involuntary movements noted. Mild fine action tremors both outstretched upper extremities likely from anxiety Tone:  Normal muscle tone.   Posture:  Mild kyphosis       Assessment/Plan: 84 y.o. female admitted for recurrent episodes of isolated non fluent language without other abnormalities on neuro-exam and CT brain without acute abnormality.Etiology is indeterminate simple partial seizures versus TIAs fromage-related ,amyloid angiopathy  Stroke workup showed no acute pathology on MRi brain. EEG abnormal while inpatient likely due to metabolic encephalopathy and repeat EEG negative.  Mild underlying chronic anxiety.  Patient is being seen today for one-year medication follow-up and overall has been doing well with continue Depakote without any recurrent transient speech difficulties   -Continue Plavix and statin for secondary stroke prevention -Continue Depakote ER 500 mg nightly -advised her that most likely her insomnia complaints were not related to Depakote but did recommend trialing taking this medication around 6 PM to see if she feels drowsy after taking or possibly making her feel more awake.  Advised her that if she does feel drowsy after taking the medication around 6 PM, she should restart taking prior to bedtime but if she feels more awake after taking, we can slowly start taking this medication earlier in the day -Continue to follow with PCP for HLD and HTN management -Continue to stay active as tolerated and maintain a healthy diet -Continue to monitor blood pressure at home Maintain strict control of hypertension with blood pressure goal below 130/90, diabetes with hemoglobin A1c goal below 6.5% and  cholesterol with LDL cholesterol (bad cholesterol) goal below 70 mg/dL. I also advised the patient to eat a healthy diet with plenty of whole grains, cereals, fruits and vegetables, exercise regularly and maintain ideal body weight.  Followup in the future with me in 1 year or call earlier if needed  Greater than 50% time during this 30 minute visit was spent on counseling and coordination of care about TIA, seizure, anxiety and answering questions    Venancio Poisson, AGNP-BC  [facility] [address] [place], [place] [address]  Phone [phone] Fax [phone] Note: This document was prepared with digital dictation and possible smart phrase technology. Any transcriptional errors that result from this process are unintentional.

## 2018-02-28 NOTE — Progress Notes (Signed)
I agree with the above plan 

## 2018-03-04 DIAGNOSIS — F439 Reaction to severe stress, unspecified: Secondary | ICD-10-CM | POA: Diagnosis not present

## 2018-03-04 DIAGNOSIS — F331 Major depressive disorder, recurrent, moderate: Secondary | ICD-10-CM | POA: Diagnosis not present

## 2018-03-20 DIAGNOSIS — M1712 Unilateral primary osteoarthritis, left knee: Secondary | ICD-10-CM | POA: Diagnosis not present

## 2018-03-20 DIAGNOSIS — M1990 Unspecified osteoarthritis, unspecified site: Secondary | ICD-10-CM | POA: Diagnosis not present

## 2018-03-20 DIAGNOSIS — M19011 Primary osteoarthritis, right shoulder: Secondary | ICD-10-CM | POA: Diagnosis not present

## 2018-04-17 DIAGNOSIS — M1711 Unilateral primary osteoarthritis, right knee: Secondary | ICD-10-CM | POA: Diagnosis not present

## 2018-04-17 DIAGNOSIS — M25512 Pain in left shoulder: Secondary | ICD-10-CM | POA: Diagnosis not present

## 2018-09-23 DIAGNOSIS — F331 Major depressive disorder, recurrent, moderate: Secondary | ICD-10-CM | POA: Diagnosis not present

## 2018-09-23 DIAGNOSIS — E039 Hypothyroidism, unspecified: Secondary | ICD-10-CM | POA: Diagnosis not present

## 2018-09-23 DIAGNOSIS — I10 Essential (primary) hypertension: Secondary | ICD-10-CM | POA: Diagnosis not present

## 2018-11-15 DIAGNOSIS — M25512 Pain in left shoulder: Secondary | ICD-10-CM | POA: Diagnosis not present

## 2018-11-15 DIAGNOSIS — M25511 Pain in right shoulder: Secondary | ICD-10-CM | POA: Diagnosis not present

## 2018-12-08 DIAGNOSIS — M170 Bilateral primary osteoarthritis of knee: Secondary | ICD-10-CM | POA: Diagnosis not present

## 2019-01-26 ENCOUNTER — Inpatient Hospital Stay (HOSPITAL_COMMUNITY)
Admission: EM | Admit: 2019-01-26 | Discharge: 2019-02-01 | DRG: 149 | Disposition: A | Discharge status: Skilled Nursing Facility | Payer: Medicare HMO | Attending: Internal Medicine | Admitting: Internal Medicine

## 2019-01-26 ENCOUNTER — Other Ambulatory Visit: Payer: Self-pay

## 2019-01-26 DIAGNOSIS — G40909 Epilepsy, unspecified, not intractable, without status epilepticus: Secondary | ICD-10-CM | POA: Diagnosis present

## 2019-01-26 DIAGNOSIS — I639 Cerebral infarction, unspecified: Secondary | ICD-10-CM | POA: Diagnosis not present

## 2019-01-26 DIAGNOSIS — Z882 Allergy status to sulfonamides status: Secondary | ICD-10-CM | POA: Diagnosis not present

## 2019-01-26 DIAGNOSIS — I119 Hypertensive heart disease without heart failure: Secondary | ICD-10-CM | POA: Diagnosis present

## 2019-01-26 DIAGNOSIS — Z7401 Bed confinement status: Secondary | ICD-10-CM | POA: Diagnosis not present

## 2019-01-26 DIAGNOSIS — I491 Atrial premature depolarization: Secondary | ICD-10-CM | POA: Diagnosis not present

## 2019-01-26 DIAGNOSIS — I4891 Unspecified atrial fibrillation: Secondary | ICD-10-CM | POA: Diagnosis not present

## 2019-01-26 DIAGNOSIS — R499 Unspecified voice and resonance disorder: Secondary | ICD-10-CM | POA: Diagnosis not present

## 2019-01-26 DIAGNOSIS — E785 Hyperlipidemia, unspecified: Secondary | ICD-10-CM | POA: Diagnosis present

## 2019-01-26 DIAGNOSIS — Z79890 Hormone replacement therapy (postmenopausal): Secondary | ICD-10-CM

## 2019-01-26 DIAGNOSIS — F4321 Adjustment disorder with depressed mood: Secondary | ICD-10-CM | POA: Diagnosis present

## 2019-01-26 DIAGNOSIS — R739 Hyperglycemia, unspecified: Secondary | ICD-10-CM | POA: Diagnosis present

## 2019-01-26 DIAGNOSIS — I672 Cerebral atherosclerosis: Secondary | ICD-10-CM | POA: Diagnosis present

## 2019-01-26 DIAGNOSIS — Z8673 Personal history of transient ischemic attack (TIA), and cerebral infarction without residual deficits: Secondary | ICD-10-CM | POA: Diagnosis not present

## 2019-01-26 DIAGNOSIS — I6381 Other cerebral infarction due to occlusion or stenosis of small artery: Secondary | ICD-10-CM | POA: Diagnosis present

## 2019-01-26 DIAGNOSIS — I10 Essential (primary) hypertension: Secondary | ICD-10-CM | POA: Diagnosis present

## 2019-01-26 DIAGNOSIS — R52 Pain, unspecified: Secondary | ICD-10-CM | POA: Diagnosis not present

## 2019-01-26 DIAGNOSIS — E039 Hypothyroidism, unspecified: Secondary | ICD-10-CM | POA: Diagnosis present

## 2019-01-26 DIAGNOSIS — H8111 Benign paroxysmal vertigo, right ear: Principal | ICD-10-CM | POA: Diagnosis present

## 2019-01-26 DIAGNOSIS — Z888 Allergy status to other drugs, medicaments and biological substances status: Secondary | ICD-10-CM

## 2019-01-26 DIAGNOSIS — R2681 Unsteadiness on feet: Secondary | ICD-10-CM | POA: Diagnosis not present

## 2019-01-26 DIAGNOSIS — Z79899 Other long term (current) drug therapy: Secondary | ICD-10-CM | POA: Diagnosis not present

## 2019-01-26 DIAGNOSIS — H8110 Benign paroxysmal vertigo, unspecified ear: Secondary | ICD-10-CM | POA: Diagnosis not present

## 2019-01-26 DIAGNOSIS — Z7902 Long term (current) use of antithrombotics/antiplatelets: Secondary | ICD-10-CM

## 2019-01-26 DIAGNOSIS — I63431 Cerebral infarction due to embolism of right posterior cerebral artery: Secondary | ICD-10-CM | POA: Diagnosis not present

## 2019-01-26 DIAGNOSIS — R7303 Prediabetes: Secondary | ICD-10-CM | POA: Diagnosis not present

## 2019-01-26 DIAGNOSIS — I361 Nonrheumatic tricuspid (valve) insufficiency: Secondary | ICD-10-CM | POA: Diagnosis not present

## 2019-01-26 DIAGNOSIS — I6782 Cerebral ischemia: Secondary | ICD-10-CM | POA: Diagnosis not present

## 2019-01-26 DIAGNOSIS — R490 Dysphonia: Secondary | ICD-10-CM

## 2019-01-26 DIAGNOSIS — Z03818 Encounter for observation for suspected exposure to other biological agents ruled out: Secondary | ICD-10-CM | POA: Diagnosis not present

## 2019-01-26 DIAGNOSIS — I469 Cardiac arrest, cause unspecified: Secondary | ICD-10-CM | POA: Diagnosis not present

## 2019-01-26 DIAGNOSIS — G4089 Other seizures: Secondary | ICD-10-CM | POA: Diagnosis not present

## 2019-01-26 DIAGNOSIS — Z8249 Family history of ischemic heart disease and other diseases of the circulatory system: Secondary | ICD-10-CM | POA: Diagnosis not present

## 2019-01-26 DIAGNOSIS — R42 Dizziness and giddiness: Secondary | ICD-10-CM | POA: Diagnosis present

## 2019-01-26 DIAGNOSIS — R110 Nausea: Secondary | ICD-10-CM | POA: Diagnosis not present

## 2019-01-26 DIAGNOSIS — R410 Disorientation, unspecified: Secondary | ICD-10-CM | POA: Diagnosis not present

## 2019-01-26 DIAGNOSIS — Z20828 Contact with and (suspected) exposure to other viral communicable diseases: Secondary | ICD-10-CM | POA: Diagnosis present

## 2019-01-26 DIAGNOSIS — M2550 Pain in unspecified joint: Secondary | ICD-10-CM | POA: Diagnosis not present

## 2019-01-26 LAB — POCT I-STAT EG7
Acid-Base Excess: 5 mmol/L — ABNORMAL HIGH (ref 0.0–2.0)
Bicarbonate: 29.6 mmol/L — ABNORMAL HIGH (ref 20.0–28.0)
Calcium, Ion: 1.26 mmol/L (ref 1.15–1.40)
HCT: 38 % (ref 36.0–46.0)
Hemoglobin: 12.9 g/dL (ref 12.0–15.0)
O2 Saturation: 57 %
Potassium: 3.9 mmol/L (ref 3.5–5.1)
Sodium: 136 mmol/L (ref 135–145)
TCO2: 31 mmol/L (ref 22–32)
pCO2, Ven: 44.1 mmHg (ref 44.0–60.0)
pH, Ven: 7.435 — ABNORMAL HIGH (ref 7.250–7.430)
pO2, Ven: 29 mmHg — CL (ref 32.0–45.0)

## 2019-01-26 LAB — CBC WITH DIFFERENTIAL/PLATELET
Abs Immature Granulocytes: 0.02 10*3/uL (ref 0.00–0.07)
Basophils Absolute: 0 10*3/uL (ref 0.0–0.1)
Basophils Relative: 0 %
Eosinophils Absolute: 0 10*3/uL (ref 0.0–0.5)
Eosinophils Relative: 0 %
HCT: 41.5 % (ref 36.0–46.0)
Hemoglobin: 13.5 g/dL (ref 12.0–15.0)
Immature Granulocytes: 0 %
Lymphocytes Relative: 33 %
Lymphs Abs: 2.5 10*3/uL (ref 0.7–4.0)
MCH: 30.3 pg (ref 26.0–34.0)
MCHC: 32.5 g/dL (ref 30.0–36.0)
MCV: 93.3 fL (ref 80.0–100.0)
Monocytes Absolute: 0.6 10*3/uL (ref 0.1–1.0)
Monocytes Relative: 9 %
Neutro Abs: 4.3 10*3/uL (ref 1.7–7.7)
Neutrophils Relative %: 58 %
Platelets: 158 10*3/uL (ref 150–400)
RBC: 4.45 MIL/uL (ref 3.87–5.11)
RDW: 13.1 % (ref 11.5–15.5)
WBC: 7.4 10*3/uL (ref 4.0–10.5)
nRBC: 0 % (ref 0.0–0.2)

## 2019-01-26 LAB — BASIC METABOLIC PANEL
Anion gap: 15 (ref 5–15)
BUN: 13 mg/dL (ref 8–23)
CO2: 24 mmol/L (ref 22–32)
Calcium: 10 mg/dL (ref 8.9–10.3)
Chloride: 100 mmol/L (ref 98–111)
Creatinine, Ser: 0.59 mg/dL (ref 0.44–1.00)
GFR calc Af Amer: >60 mL/min (ref >60)
GFR calc non Af Amer: >60 mL/min (ref >60)
Glucose, Bld: 125 mg/dL — ABNORMAL HIGH (ref 70–99)
Potassium: 3.6 mmol/L (ref 3.5–5.1)
Sodium: 139 mmol/L (ref 135–145)

## 2019-01-26 LAB — URINALYSIS, ROUTINE W REFLEX MICROSCOPIC
Bilirubin Urine: NEGATIVE
Glucose, UA: NEGATIVE mg/dL
Hgb urine dipstick: NEGATIVE
Ketones, ur: 20 mg/dL — AB
Leukocytes,Ua: NEGATIVE
Nitrite: NEGATIVE
Protein, ur: NEGATIVE mg/dL
Specific Gravity, Urine: 1.006 (ref 1.005–1.030)
pH: 7 (ref 5.0–8.0)

## 2019-01-26 MED ORDER — SODIUM CHLORIDE 0.9 % IV BOLUS
500.0000 mL | Freq: Once | INTRAVENOUS | Status: AC
  Administered 2019-01-26: 500 mL via INTRAVENOUS

## 2019-01-26 MED ORDER — ONDANSETRON 4 MG PO TBDP
8.0000 mg | ORAL_TABLET | Freq: Once | ORAL | Status: AC
  Administered 2019-01-26: 8 mg via ORAL
  Filled 2019-01-26: qty 2

## 2019-01-26 MED ORDER — SODIUM CHLORIDE 0.9 % IV SOLN
INTRAVENOUS | Status: DC
  Administered 2019-01-26: 23:00:00 via INTRAVENOUS

## 2019-01-26 NOTE — ED Triage Notes (Signed)
BIB thru [facility], felt dizziness and nausea at home. Initial temp of 99.40F, reportedly on Plavix. Reportedly unable to walk to transfer to the stretcher. Reportedly in and out Afib currently NSR in the 90's, VSS, and had CBG of 147.

## 2019-01-26 NOTE — ED Notes (Signed)
Danielle Bryan [id number] husband looking for a call back about the patient

## 2019-01-26 NOTE — ED Notes (Signed)
The pt thinks her son is in our waiting room  He is not  I have called the only 2 numbers I have for her and no one answers  Pt asking for her son  Will keep tyring

## 2019-01-26 NOTE — ED Provider Notes (Signed)
11:00 PM  Assumed care from Dr. Eulis Foster.  Patient is a 85 y.o. F who presents to ED with acute vertigo.  Labs unremarkable.  Small ketones in urine.  Getting IVF.  No UTI.  MRI brain pending.  Will ambulate patient as well.  2:30 AM  Pt unable to ambulate per nurse due to being extremely symptomatic.  Will give meclizine, Zofran.  MRI concerning for unilateral right hippocampal restricted diffusion and swelling without hemorrhage or mass-effect that is likely acute ischemia.  Will give aspirin and discuss with neurology.  Patient will need admission given she is unable to ambulate.  2:40 AM  Discussed with Dr. Lorraine Lax.  He has reviewed patient's imaging and agrees that is an acute ischemic change but does not feel it is the cause of her vertigo.  Neurology will see in consult.  Will admit to medicine.  3:27 AM Discussed patient's case with hospitalist, Dr. Marlowe Sax.  I have recommended admission and patient (and family if present) agree with this plan. Admitting physician will place admission orders.   I reviewed all nursing notes, vitals, pertinent previous records and interpreted all EKGs, lab and urine results, imaging (as available).     Sven Pinheiro, Delice Bison, DO 01/27/19 [phone]

## 2019-01-26 NOTE — ED Notes (Signed)
The son of this pt the pt remembered his number  [phone]  No answer when called  Answering machine  Left a message

## 2019-01-26 NOTE — ED Notes (Signed)
The pt reports she gets dizzy whenever her head moves

## 2019-01-26 NOTE — ED Provider Notes (Signed)
[facility] EMERGENCY DEPARTMENT Provider Note   CSN: [id number] Arrival date & time: 01/26/19  1933     History   Chief Complaint Chief Complaint  Patient presents with  . Dizziness  . Nausea    HPI Danielle Bryan is a 85 y.o. female.     HPI   Patient presents for evaluation of dizziness.  I discussed findings with the patient's son who helps to give history.  He talked to her around noon today at that time he thought she was hoarse, and suggested she drink some fluid.  He talked to her again later and she again was hoarse, and complained of dizziness, so she came here by EMS.  Patient reports a sensation of spinning, when she moves her head or looks around, which started this morning.  She denies headache, neck pain, chest pain, back pain, focal weakness or paresthesia.  According to her son, she is functional, manages affairs including all her ADLs and writes her own checks.  Her son helps her with grocery shopping.  He has not noticed any change in her behavior.  She has not had any recent illnesses.  She is taking her usual medications.  There are no other known modifying factors.  Past Medical History:  Diagnosis Date  . Hypercholesterolemia   . Hypertension   . Hypothyroidism   . Osteoarthritis   . Reactive depression (situational)   . Stroke ([place])   . TIA (transient ischemic attack)    on plavix    Patient Active Problem List   Diagnosis Date Noted  . Aphasia 03/20/2016  . HTN (hypertension) 03/20/2016  . Weakness 03/18/2015  . Acute kidney injury ([place]) 03/18/2015  . Dehydration 03/18/2015  . Nausea and vomiting 03/18/2015  . Thrombocytopenia ([place]) 03/18/2015  . Moderate nausea and vomiting 03/18/2015  . Elevated lactic acid level 03/18/2015  . Elevated troponin 03/18/2015  . Encephalopathy, metabolic 123456  . UTI (urinary tract infection) 08/22/2014  . TIA (transient ischemic attack) 03/19/2014  . Benign hypertensive heart disease  without heart failure 08/20/2010  . Hypothyroidism   . Osteoarthritis   . Hypercholesterolemia   . Reactive depression (situational)     Past Surgical History:  Procedure Laterality Date  . CATARACT EXTRACTION Left 1992  . CATARACT EXTRACTION Right 1995  . DEBRIDEMENT AND CLOSURE WOUND       OB History   No obstetric history on file.      Home Medications    Prior to Admission medications   Medication Sig Start Date End Date Taking? Authorizing Provider  acetaminophen (TYLENOL) 325 MG tablet Take 325 mg by mouth every 6 (six) hours as needed for mild pain.  01/09/15   [provider]  amLODipine (NORVASC) 5 MG tablet Take 5 mg by mouth daily. 03/18/16   [provider]  Calcium Carb-Cholecalciferol (CALCIUM 600/VITAMIN D3) 600-800 MG-UNIT TABS Take 1 tablet by mouth 2 (two) times daily.     [provider]  clopidogrel (PLAVIX) 75 MG tablet Take 1 tablet (75 mg total) by mouth daily. 03/21/14   Velna Hatchet, MD  divalproex (DEPAKOTE ER) 500 MG 24 hr tablet Take 1 tablet (500 mg total) by mouth daily. 02/11/18   Frann Rider, NP  levothyroxine (SYNTHROID, LEVOTHROID) 100 MCG tablet TAKE 1 TABLET (100 MCG TOTAL) BY MOUTH DAILY. TAKING 6 DAYS A WEEK. DON'T TAKE ON Meribeth Mattes, MD  Multiple Vitamin (MULTIVITAMIN WITH MINERALS) TABS tablet Take 1 tablet by  mouth daily.    [provider]  pantoprazole (PROTONIX) 40 MG tablet Take 40 mg by mouth daily.    [provider]  Pitavastatin Calcium (LIVALO) 2 MG TABS Take 1 tablet (2 mg total) by mouth once a week. Take 2 mg by mouth twice a week. 03/21/16   Eugenie Filler, MD  Vitamins A & D (VITAMIN A & D) 10000-400 units TABS Use as directed 1,000 Units in the mouth or throat daily. 03/05/16   [provider]    Family History Family History  Problem Relation Age of Onset  . Heart failure Mother   . Pneumonia Mother   . Heart failure Father     Social  History Social History   Tobacco Use  . Smoking status: Never Smoker  . Smokeless tobacco: Never Used  Substance Use Topics  . Alcohol use: No    Alcohol/week: 0.0 standard drinks  . Drug use: No     Allergies   Escitalopram oxalate, Lisinopril, Statins, and Sulfa antibiotics   Review of Systems Review of Systems  All other systems reviewed and are negative.    Physical Exam Updated Vital Signs BP (!) 141/80   Pulse 94   Resp 13   SpO2 99%   Physical Exam Vitals signs and nursing note reviewed.  Constitutional:      General: She is not in acute distress.    Appearance: She is well-developed. She is not ill-appearing, toxic-appearing or diaphoretic.     Comments: Elderly, frail  HENT:     Head: Normocephalic and atraumatic.     Right Ear: Tympanic membrane and ear canal normal.     Left Ear: Tympanic membrane and ear canal normal.     Nose: Nose normal. No congestion or rhinorrhea.     Mouth/Throat:     Mouth: Mucous membranes are moist.     Pharynx: No oropharyngeal exudate or posterior oropharyngeal erythema.  Eyes:     Conjunctiva/sclera: Conjunctivae normal.     Pupils: Pupils are equal, round, and reactive to light.  Neck:     Musculoskeletal: Normal range of motion and neck supple.     Trachea: Phonation normal.  Cardiovascular:     Rate and Rhythm: Normal rate and regular rhythm.  Pulmonary:     Effort: Pulmonary effort is normal.     Breath sounds: Normal breath sounds.  Chest:     Chest wall: No tenderness.  Abdominal:     General: There is no distension.     Palpations: Abdomen is soft.     Tenderness: There is no abdominal tenderness. There is no guarding.  Musculoskeletal: Normal range of motion.  Skin:    General: Skin is warm and dry.  Neurological:     Mental Status: She is alert and oriented to person, place, and time.     Cranial Nerves: No cranial nerve deficit.     Sensory: No sensory deficit.     Motor: No abnormal muscle tone.      Comments: No dysarthria or aphasia.  No pronator drift.  No nystagmus. HINTS negative.  Psychiatric:        Mood and Affect: Mood normal.        Behavior: Behavior normal.        Thought Content: Thought content normal.        Judgment: Judgment normal.      ED Treatments / Results  Labs (all labs ordered are listed, but only abnormal results are  displayed) Labs Reviewed  BASIC METABOLIC PANEL - Abnormal; Notable for the following components:      Result Value   Glucose, Bld 125 (*)    All other components within normal limits  POCT I-STAT EG7 - Abnormal; Notable for the following components:   pH, Ven 7.435 (*)    pO2, Ven 29.0 (*)    Bicarbonate 29.6 (*)    Acid-Base Excess 5.0 (*)    All other components within normal limits  SARS CORONAVIRUS 2 (TAT 6-24 HRS)  CBC WITH DIFFERENTIAL/PLATELET  URINALYSIS, ROUTINE W REFLEX MICROSCOPIC    EKG EKG Interpretation  Date/Time:  Wednesday January 26 2019 19:53:20 EST Ventricular Rate:  94 PR Interval:    QRS Duration: 95 QT Interval:  366 QTC Calculation: 458 R Axis:   87 Text Interpretation: Sinus rhythm Anterior infarct, old Since last tracing rate slower Otherwise no significant change Confirmed by Daleen Bo [phone]) on 01/26/2019 7:58:11 PM   Radiology No results found.  Procedures Procedures (including critical care time)  Medications Ordered in ED Medications  0.9 %  sodium chloride infusion ( Intravenous New Bag/Given 01/26/19 2241)  sodium chloride 0.9 % bolus 500 mL (0 mLs Intravenous Stopped 01/26/19 2239)  ondansetron (ZOFRAN-ODT) disintegrating tablet 8 mg (8 mg Oral Given 01/26/19 2255)     Initial Impression / Assessment and Plan / ED Course  I have reviewed the triage vital signs and the nursing notes.  Pertinent labs & imaging results that were available during my care of the patient were reviewed by me and considered in my medical decision making (see chart for details).  Clinical Course as  of Jan 26 2255  Wed Jan 26, 2019  2146 Normal except glucose high  Basic metabolic panel(!) [EW]  [id number] Normal except pH 9, PO2 low, bicarbonate, acid-base high, and venous testing.  POCT I-Stat EG7(!!) [EW]  2147 Normal  CBC with Differential [EW]  [id number] Per nursing, patient is currently nauseated and wants something for it.  Zofran ordered.   [EW]    Clinical Course User Index [EW] Daleen Bo, MD        Patient Vitals for the past 24 hrs:  BP Pulse Resp SpO2  01/26/19 2245 (!) 141/80 94 13 99 %  01/26/19 2130 132/81 95 17 100 %  01/26/19 2100 140/68 90 16 98 %  01/26/19 2048 139/73 95 - -  01/26/19 2047 (!) 151/76 91 - -  01/26/19 2045 139/73 95 13 98 %  01/26/19 2030 (!) 155/89 92 (!) 9 98 %  01/26/19 2000 (!) 165/69 95 11 94 %  01/26/19 1945 (!) 158/67 98 - 99 %    Medical Decision Making: Dizziness, onset today, with "hoarse voice."  Patient is elderly, but functional, living alone.  Her son is concerned that she is different than usual today, hence encouraged her to come here.  Patient is a somewhat poor historian.  Screening for CVA with MRI.  Medical evaluation is reassuring.  10:45 PM-MRI brain and urinalysis pending.  CRITICAL CARE-no Performed by: Daleen Bo  Nursing Notes Reviewed/ Care Coordinated Applicable Imaging Reviewed Interpretation of Laboratory Data incorporated into ED treatment  Disposition per oncoming provider team after return of MRI and the UA.  If these do not show any serious problems she should be able to manage in the home setting.  Final Clinical Impressions(s) / ED Diagnoses   Final diagnoses:  Dizziness  Hoarse voice quality    ED Discharge Orders    None  Daleen Bo, MD 01/26/19 2256

## 2019-01-26 NOTE — ED Notes (Signed)
Pt c/o nausea.  

## 2019-01-27 ENCOUNTER — Inpatient Hospital Stay (HOSPITAL_COMMUNITY): Payer: Medicare HMO

## 2019-01-27 ENCOUNTER — Emergency Department (HOSPITAL_COMMUNITY): Payer: Medicare HMO

## 2019-01-27 DIAGNOSIS — E785 Hyperlipidemia, unspecified: Secondary | ICD-10-CM

## 2019-01-27 DIAGNOSIS — R739 Hyperglycemia, unspecified: Secondary | ICD-10-CM | POA: Diagnosis present

## 2019-01-27 DIAGNOSIS — Z8249 Family history of ischemic heart disease and other diseases of the circulatory system: Secondary | ICD-10-CM | POA: Diagnosis not present

## 2019-01-27 DIAGNOSIS — Z888 Allergy status to other drugs, medicaments and biological substances status: Secondary | ICD-10-CM | POA: Diagnosis not present

## 2019-01-27 DIAGNOSIS — I361 Nonrheumatic tricuspid (valve) insufficiency: Secondary | ICD-10-CM | POA: Diagnosis not present

## 2019-01-27 DIAGNOSIS — I639 Cerebral infarction, unspecified: Secondary | ICD-10-CM

## 2019-01-27 DIAGNOSIS — I63431 Cerebral infarction due to embolism of right posterior cerebral artery: Secondary | ICD-10-CM

## 2019-01-27 DIAGNOSIS — R7303 Prediabetes: Secondary | ICD-10-CM | POA: Diagnosis not present

## 2019-01-27 DIAGNOSIS — H8110 Benign paroxysmal vertigo, unspecified ear: Secondary | ICD-10-CM | POA: Diagnosis not present

## 2019-01-27 DIAGNOSIS — Z8673 Personal history of transient ischemic attack (TIA), and cerebral infarction without residual deficits: Secondary | ICD-10-CM | POA: Diagnosis not present

## 2019-01-27 DIAGNOSIS — I6381 Other cerebral infarction due to occlusion or stenosis of small artery: Secondary | ICD-10-CM | POA: Diagnosis present

## 2019-01-27 DIAGNOSIS — E039 Hypothyroidism, unspecified: Secondary | ICD-10-CM

## 2019-01-27 DIAGNOSIS — Z79899 Other long term (current) drug therapy: Secondary | ICD-10-CM | POA: Diagnosis not present

## 2019-01-27 DIAGNOSIS — I6782 Cerebral ischemia: Secondary | ICD-10-CM | POA: Diagnosis not present

## 2019-01-27 DIAGNOSIS — I119 Hypertensive heart disease without heart failure: Secondary | ICD-10-CM | POA: Diagnosis present

## 2019-01-27 DIAGNOSIS — Z7902 Long term (current) use of antithrombotics/antiplatelets: Secondary | ICD-10-CM | POA: Diagnosis not present

## 2019-01-27 DIAGNOSIS — Z20828 Contact with and (suspected) exposure to other viral communicable diseases: Secondary | ICD-10-CM | POA: Diagnosis present

## 2019-01-27 DIAGNOSIS — F4321 Adjustment disorder with depressed mood: Secondary | ICD-10-CM | POA: Diagnosis present

## 2019-01-27 DIAGNOSIS — G40909 Epilepsy, unspecified, not intractable, without status epilepticus: Secondary | ICD-10-CM | POA: Diagnosis present

## 2019-01-27 DIAGNOSIS — R42 Dizziness and giddiness: Secondary | ICD-10-CM

## 2019-01-27 DIAGNOSIS — I10 Essential (primary) hypertension: Secondary | ICD-10-CM | POA: Diagnosis not present

## 2019-01-27 DIAGNOSIS — H8111 Benign paroxysmal vertigo, right ear: Secondary | ICD-10-CM | POA: Diagnosis present

## 2019-01-27 DIAGNOSIS — Z882 Allergy status to sulfonamides status: Secondary | ICD-10-CM | POA: Diagnosis not present

## 2019-01-27 DIAGNOSIS — Z79890 Hormone replacement therapy (postmenopausal): Secondary | ICD-10-CM | POA: Diagnosis not present

## 2019-01-27 DIAGNOSIS — I672 Cerebral atherosclerosis: Secondary | ICD-10-CM | POA: Diagnosis present

## 2019-01-27 LAB — COMPREHENSIVE METABOLIC PANEL
ALT: 12 U/L (ref 0–44)
AST: 24 U/L (ref 15–41)
Albumin: 3.9 g/dL (ref 3.5–5.0)
Alkaline Phosphatase: 50 U/L (ref 38–126)
Anion gap: 12 (ref 5–15)
BUN: 8 mg/dL (ref 8–23)
CO2: 24 mmol/L (ref 22–32)
Calcium: 9.2 mg/dL (ref 8.9–10.3)
Chloride: 102 mmol/L (ref 98–111)
Creatinine, Ser: 0.68 mg/dL (ref 0.44–1.00)
GFR calc Af Amer: >60 mL/min (ref >60)
GFR calc non Af Amer: >60 mL/min (ref >60)
Glucose, Bld: 199 mg/dL — ABNORMAL HIGH (ref 70–99)
Potassium: 3.5 mmol/L (ref 3.5–5.1)
Sodium: 138 mmol/L (ref 135–145)
Total Bilirubin: 0.7 mg/dL (ref 0.3–1.2)
Total Protein: 6.7 g/dL (ref 6.5–8.1)

## 2019-01-27 LAB — LIPID PANEL
Cholesterol: 280 mg/dL — ABNORMAL HIGH (ref 0–200)
HDL: 86 mg/dL (ref >40)
LDL Cholesterol: 179 mg/dL — ABNORMAL HIGH (ref 0–99)
Total CHOL/HDL Ratio: 3.3 RATIO
Triglycerides: 74 mg/dL (ref <150)
VLDL: 15 mg/dL (ref 0–40)

## 2019-01-27 LAB — CBC WITH DIFFERENTIAL/PLATELET
Abs Immature Granulocytes: 0.02 10*3/uL (ref 0.00–0.07)
Basophils Absolute: 0 10*3/uL (ref 0.0–0.1)
Basophils Relative: 0 %
Eosinophils Absolute: 0 10*3/uL (ref 0.0–0.5)
Eosinophils Relative: 0 %
HCT: 42 % (ref 36.0–46.0)
Hemoglobin: 13.5 g/dL (ref 12.0–15.0)
Immature Granulocytes: 0 %
Lymphocytes Relative: 22 %
Lymphs Abs: 1.7 10*3/uL (ref 0.7–4.0)
MCH: 30.2 pg (ref 26.0–34.0)
MCHC: 32.1 g/dL (ref 30.0–36.0)
MCV: 94 fL (ref 80.0–100.0)
Monocytes Absolute: 0.9 10*3/uL (ref 0.1–1.0)
Monocytes Relative: 11 %
Neutro Abs: 5.3 10*3/uL (ref 1.7–7.7)
Neutrophils Relative %: 67 %
Platelets: 163 10*3/uL (ref 150–400)
RBC: 4.47 MIL/uL (ref 3.87–5.11)
RDW: 13 % (ref 11.5–15.5)
WBC: 8 10*3/uL (ref 4.0–10.5)
nRBC: 0 % (ref 0.0–0.2)

## 2019-01-27 LAB — HEMOGLOBIN A1C
Hgb A1c MFr Bld: 5.7 % — ABNORMAL HIGH (ref 4.8–5.6)
Mean Plasma Glucose: 116.89 mg/dL

## 2019-01-27 LAB — ECHOCARDIOGRAM COMPLETE

## 2019-01-27 LAB — MAGNESIUM: Magnesium: 1.8 mg/dL (ref 1.7–2.4)

## 2019-01-27 LAB — SARS CORONAVIRUS 2 (TAT 6-24 HRS): SARS Coronavirus 2: NEGATIVE

## 2019-01-27 LAB — PHOSPHORUS: Phosphorus: 2.4 mg/dL — ABNORMAL LOW (ref 2.5–4.6)

## 2019-01-27 MED ORDER — PANTOPRAZOLE SODIUM 40 MG PO TBEC
40.0000 mg | DELAYED_RELEASE_TABLET | Freq: Every day | ORAL | Status: DC
  Administered 2019-01-27 – 2019-02-01 (×6): 40 mg via ORAL
  Filled 2019-01-27 (×6): qty 1

## 2019-01-27 MED ORDER — CLOPIDOGREL BISULFATE 75 MG PO TABS
75.0000 mg | ORAL_TABLET | Freq: Every day | ORAL | Status: DC
  Administered 2019-01-27 – 2019-02-01 (×6): 75 mg via ORAL
  Filled 2019-01-27 (×6): qty 1

## 2019-01-27 MED ORDER — LEVOTHYROXINE SODIUM 100 MCG PO TABS
100.0000 ug | ORAL_TABLET | Freq: Every day | ORAL | Status: DC
  Administered 2019-01-27 – 2019-02-01 (×6): 100 ug via ORAL
  Filled 2019-01-27 (×6): qty 1

## 2019-01-27 MED ORDER — ACETAMINOPHEN 650 MG RE SUPP: 650.0000 mg | RECTAL | Status: DC | PRN

## 2019-01-27 MED ORDER — ONDANSETRON HCL 4 MG/2ML IJ SOLN: 4.0000 mg | Freq: Four times a day (QID) | INTRAMUSCULAR | Status: DC | PRN

## 2019-01-27 MED ORDER — ASPIRIN 81 MG PO CHEW
324.0000 mg | CHEWABLE_TABLET | Freq: Once | ORAL | Status: AC
  Administered 2019-01-27: 324 mg via ORAL
  Filled 2019-01-27: qty 4

## 2019-01-27 MED ORDER — ENOXAPARIN SODIUM 30 MG/0.3ML ~~LOC~~ SOLN
30.0000 mg | SUBCUTANEOUS | Status: DC
  Administered 2019-01-27 – 2019-02-01 (×6): 30 mg via SUBCUTANEOUS
  Filled 2019-01-27 (×6): qty 0.3

## 2019-01-27 MED ORDER — SODIUM CHLORIDE 0.9 % IV SOLN
INTRAVENOUS | Status: DC
  Administered 2019-01-27 – 2019-01-28 (×3): via INTRAVENOUS

## 2019-01-27 MED ORDER — MECLIZINE HCL 12.5 MG PO TABS
12.5000 mg | ORAL_TABLET | Freq: Three times a day (TID) | ORAL | Status: DC | PRN
  Administered 2019-01-27 (×2): 12.5 mg via ORAL
  Filled 2019-01-27 (×2): qty 1

## 2019-01-27 MED ORDER — ACETAMINOPHEN 160 MG/5ML PO SOLN: 650.0000 mg | ORAL | Status: DC | PRN

## 2019-01-27 MED ORDER — ONDANSETRON HCL 4 MG/2ML IJ SOLN
4.0000 mg | Freq: Once | INTRAMUSCULAR | Status: AC
  Administered 2019-01-27: 4 mg via INTRAVENOUS
  Filled 2019-01-27: qty 2

## 2019-01-27 MED ORDER — DIVALPROEX SODIUM ER 500 MG PO TB24
500.0000 mg | ORAL_TABLET | Freq: Every day | ORAL | Status: DC
  Administered 2019-01-27 – 2019-02-01 (×6): 500 mg via ORAL
  Filled 2019-01-27 (×6): qty 1

## 2019-01-27 MED ORDER — STROKE: EARLY STAGES OF RECOVERY BOOK
Freq: Once | Status: AC
  Administered 2019-01-27: 05:00:00
  Filled 2019-01-27: qty 1

## 2019-01-27 MED ORDER — ASPIRIN EC 81 MG PO TBEC
81.0000 mg | DELAYED_RELEASE_TABLET | Freq: Every day | ORAL | Status: DC
  Administered 2019-01-28 – 2019-02-01 (×5): 81 mg via ORAL
  Filled 2019-01-27 (×5): qty 1

## 2019-01-27 MED ORDER — IOHEXOL 350 MG/ML SOLN
75.0000 mL | Freq: Once | INTRAVENOUS | Status: AC | PRN
  Administered 2019-01-27: 75 mL via INTRAVENOUS

## 2019-01-27 MED ORDER — ADULT MULTIVITAMIN W/MINERALS CH
1.0000 | ORAL_TABLET | Freq: Every day | ORAL | Status: DC
  Administered 2019-01-27 – 2019-02-01 (×6): 1 via ORAL
  Filled 2019-01-27 (×6): qty 1

## 2019-01-27 MED ORDER — ACETAMINOPHEN 325 MG PO TABS
650.0000 mg | ORAL_TABLET | ORAL | Status: DC | PRN
  Administered 2019-01-30 – 2019-02-01 (×3): 650 mg via ORAL
  Filled 2019-01-27 (×3): qty 2

## 2019-01-27 MED ORDER — MECLIZINE HCL 25 MG PO TABS
12.5000 mg | ORAL_TABLET | Freq: Once | ORAL | Status: AC
  Administered 2019-01-27: 12.5 mg via ORAL
  Filled 2019-01-27: qty 1

## 2019-01-27 NOTE — ED Notes (Signed)
Report given to Jennifer RN

## 2019-01-27 NOTE — Consult Note (Signed)
Requesting Physician: Dr. Leonides Schanz    Chief Complaint: Dizziness  History obtained from: Patient and Chart    HPI:                                                                                                                                       Danielle Bryan is a 85 y.o. female with past medical history significant for CVA, hypertension, hyperlipidemia who presents to the emergency department with sudden onset vertigo on waking up the morning of 12/12.  She describes her dizziness as sensation of room spinning and is positional brought upon when she turns her head.  Patient was not able to ambulate and MRI brain was obtained to rule out posterior circulation stroke.  MRI brain did show a area of restriction diffusion in the right hippocampus read as acute ischemic stroke.  Neurology was consulted for further recommendations.  Date last known well: 12.1.20 tPA Given: No, outside TPA window Baseline MRS 1   Past Medical History:  Diagnosis Date  . Hypercholesterolemia   . Hypertension   . Hypothyroidism   . Osteoarthritis   . Reactive depression (situational)   . Stroke ([place])   . TIA (transient ischemic attack)    on plavix    Past Surgical History:  Procedure Laterality Date  . CATARACT EXTRACTION Left 1992  . CATARACT EXTRACTION Right 1995  . DEBRIDEMENT AND CLOSURE WOUND      Family History  Problem Relation Age of Onset  . Heart failure Mother   . Pneumonia Mother   . Heart failure Father    Social History:  reports that she has never smoked. She has never used smokeless tobacco. She reports that she does not drink alcohol or use drugs.  Allergies:  Allergies  Allergen Reactions  . Escitalopram Oxalate Nausea Only  . Lisinopril Other (See Comments)    Very decreased blood pressure   . Statins     myalgias  . Sulfa Antibiotics     hives    Medications:                                                                                                                         I reviewed home medications   ROS:  14 systems reviewed and negative except above   Examination:                                                                                                      General: Appears well-developed  Psych: Affect appropriate to situation Eyes: No scleral injection HENT: No OP obstrucion Head: Normocephalic.  Cardiovascular: Normal rate and regular rhythm.  Respiratory: Effort normal and breath sounds normal to anterior ascultation GI: Soft.  No distension. There is no tenderness.  Skin: WDI    Neurological Examination Mental Status: Alert, oriented, thought content appropriate.  Speech fluent without evidence of aphasia. Able to follow 3 step commands without difficulty. Cranial Nerves: II: Visual fields grossly normal,  III,IV, VI: ptosis not present, extra-ocular motions intact bilaterally, mild horizontal nystagmus to left gaze, pupils equal, round, reactive to light and accommodation V,VII: smile symmetric, facial light touch sensation normal bilaterally VIII: hearing normal bilaterally IX,X: uvula rises symmetrically XI: bilateral shoulder shrug XII: midline tongue extension Motor: Right : Upper extremity   5/5    Left:     Upper extremity   5/5  Lower extremity   5/5     Lower extremity   5/5 Tone and bulk:normal tone throughout; no atrophy noted Sensory: Pinprick and light touch intact throughout, bilaterally Deep Tendon Reflexes: 2+ and symmetric throughout Plantars: Right: downgoing   Left: downgoing Cerebellar: normal finger-to-nose, normal rapid alternating movements and normal heel-to-shin test Gait: Not assessed     Lab Results: Basic Metabolic Panel: Recent Labs  Lab 01/26/19 2009 01/26/19 2046  NA 139 136  K 3.6 3.9  CL 100  --   CO2 24  --   GLUCOSE 125*  --   BUN 13  --    CREATININE 0.59  --   CALCIUM 10.0  --     CBC: Recent Labs  Lab 01/26/19 2009 01/26/19 2046  WBC 7.4  --   NEUTROABS 4.3  --   HGB 13.5 12.9  HCT 41.5 38.0  MCV 93.3  --   PLT 158  --     Coagulation Studies: No results for input(s): LABPROT, INR in the last 72 hours.  Imaging: Mr Brain Wo Contrast  Result Date: 01/27/2019 CLINICAL DATA:  85 year old female with acute onset dizziness 12-24 hours ago. No mention of seizure like activity. EXAM: MRI HEAD WITHOUT CONTRAST TECHNIQUE: Multiplanar, multiecho pulse sequences of the brain and surrounding structures were obtained without intravenous contrast. COMPARISON:  Brain MRI 03/20/2016. FINDINGS: Brain: Confluent restricted diffusion throughout the head and body of the right hippocampus (series 7, image 52). Associated T2 hyperintensity and mild hippocampal swelling on series 17, image 15. No associated hemorrhage or regional mass effect. No other restricted diffusion. No midline shift, mass effect, evidence of mass lesion, ventriculomegaly, extra-axial collection or acute intracranial hemorrhage. Cervicomedullary junction and pituitary are within normal limits. There is patchy chronic encephalomalacia at the right frontal operculum, stable. Patchy and confluent superimposed bilateral cerebral white matter T2 and FLAIR hyperintensity is stable since 2018. Similar patchy T2 signal in the bilateral deep gray  nuclei and pons also appears stable. Cerebellum remains negative. There are new chronic microhemorrhages in the central pons and the left deep cerebellar nuclei since 2018 (series 14, image 20, 13). No other cortical encephalomalacia or chronic cerebral blood products identified. Vascular: Major intracranial vascular flow voids are stable. Skull and upper cervical spine: Normal for age visible cervical spine. Normal bone marrow signal. Sinuses/Orbits: Stable and negative. Other: Mastoids remain clear. Visible internal auditory structures  appear normal. Scalp and face soft tissues appear negative. IMPRESSION: 1. Confluent unilateral right hippocampal restricted diffusion and swelling. No associated hemorrhage or mass effect. In this clinical setting I favor this reflects acute ischemia. 2. Underlying chronic small vessel disease with mild progression since 2018. Electronically Signed   By: Genevie Ann M.D.   On: 01/27/2019 01:29     ASSESSMENT AND PLAN   85 year old female presenting with vertigo.  Based on description of symptoms vertigo likely peripheral in origin.  However, MRI shows a right hippocampal restriction diffusion favoring infarct.  It is unusual for a infarct in this location to cause vertigo, however could possibly occurred.  Recommend vascular imaging to rule out vertebrobasilar insufficiency.  Other possibilities for patient's MRI findings could be metabolic such as hypoglycemia however that does not appear to be the case release from her lab work.  No symptoms of seizures.  Possible acute Ischemic Stroke   Recommend #MRA Head and neck  #Transthoracic Echo  # Start patient on ASA 325mg  daily, tele to evaluate for possible atrial fibrillation  #Start or continue Atorvastatin 80 mg/other high intensity statin # BP goal: permissive HTN upto 220/120 mmHg ( 185/110 if patient has CHF, CKD) # HBAIC and Lipid profile # Telemetry monitoring # Frequent neuro checks # NPO until passes stroke swallow screen  Please page stroke NP  Or  PA  Or MD from 8am -4 pm  as this patient from this time will be  followed by the stroke.   You can look them up on [email or web address]  Password [facility]     [facility] Pager Number [phone]

## 2019-01-27 NOTE — ED Notes (Signed)
Heart healthy breakfast tray ordered 

## 2019-01-27 NOTE — ED Notes (Signed)
Pt not ambulated even the slight movement of her head caused extreme dizziness and nausea

## 2019-01-27 NOTE — ED Notes (Signed)
Attempted report x1, RN unable to take report

## 2019-01-27 NOTE — Progress Notes (Signed)
Care started prior to midnight in the emergency room and patient was admitted early this morning after midnight and H&P has been reviewed and I am in current agreement with assessment and plan done by Dr. Shela Leff.  Additional changes to the plan of care been made accordingly.  85-year-old Caucasian elderly female with a past medical history significant for but not limited to CVA and TIA on Plavix, hypertension, hyperlipidemia, hypothyroidism, osteoarthritis, situational depression, and other comorbidities who presented with a chief complaint of dizziness and nausea.  She woke up yesterday morning at 12 to around 8 AM and was trying to sit up in the bed started feeling dizzy and nauseous.  She states she vomited a little bit and has continued to feel dizzy and nauseous and has not been able to ambulate without support.  She denies any focal weakness or numbness and denies any headaches changes in vision but describes the dizziness as a room spinning sensation.  She stated that is worse every time she moves her head and because she was extremely symptomatic she is brought to the emergency room and she is not able to ambulate.  MRI was done was concerning for a unilateral hippocampal restriction diffusion is swelling with no associated hemorrhage or mass-effect these findings are favoring acute ischemia.  The ED provider paged and discussed the case with Dr. Lorraine Lax of neurology who reviewed the images and agrees that there is an acute ischemic change but did not feel that it is the cause of her vertigo.  Was formally consulted and she was given aspirin 324 mg, Zofran and a 500 mL bolus of normal saline.  She was admitted for the following and is currently being treated for but not limited to:  Acute CVA, Vertigo -Patient is presenting with complaints of dizziness and nausea.   -Symptom onset 12/2 around 8 AM.   -She has a history of prior CVA/TIA on Plavix.   -MRI concerning for unilateral right  hippocampal restriction diffusion and swelling.  No associated hemorrhage or mass-effect.  Findings favor acute ischemia.  -ED provider discussed the case with Dr. Lorraine Lax from neurology who reviewed the patient's imaging and agrees that this is an acute ischemic change but does not feel it is the cause of her vertigo.  BPPV is also on the differential for vertigo. -C/w Telemetry monitoring to evaluate for possible atrial fibrillation -Allow for permissive hypertension-treat only if systolic blood pressures are greater than 220. -CT Head and Neck done -TransThoracic 2D Echocardiogram ordered and to be done; EEG also being done by Neurology along with LE VTE -Neurology recommending Continuing ASA 81 mgand Plavix 75 mg daily -Hemoglobin A1c, Fasting lipid panel -Received Aspirin 324 mg daily: Neurology also recommending starting high intensity statin but ? Intolerance so will need to discuss and Start Statin in AM  -Frequent neurochecks -PT, OT, speech therapy. PT to do Vestibular Testing  -N.p.o. until cleared by bedside swallow evaluation or formal speech evaluation -Neurology will see the patient, recommendations pending -Continue Meclizine -Antiemetic as needed -Patient passed bedside swallow screen so we will start on a heart healthy diet -May need a Loop Recorder per Neuro  Hypertension -Allow permissive hypertension at this time  Hx of Seizure Disorder -Continue divalproex 500 mg p.o. daily and Neurology checking an EEG  Hypothyroidism -Check TSH -Continue with levothyroxine 100 mcg  Hyperlipidemia -Lipid Panel done and showed a total cholesterol/HDL ratio of 3.3, cholesterol level was 280, HDL was 86, LDL was 179, triglycerides were 74,  and VLDL 15 -Will need to discuss with patient if she is Statin Intolerant as it is listed as an Allergy causing Myalgias  We will continue to monitor patient clinical response to intervention and repeat blood work and imaging in the a.m. and  follow-up on neurology recommendations.

## 2019-01-27 NOTE — ED Notes (Signed)
Ordered diet tray 

## 2019-01-27 NOTE — ED Notes (Signed)
Pt placed on purewick 

## 2019-01-27 NOTE — Plan of Care (Signed)
  Problem: Education: Goal: Knowledge of disease or condition will improve Outcome: Progressing Goal: Knowledge of secondary prevention will improve Outcome: Progressing Goal: Knowledge of patient specific risk factors addressed and post discharge goals established will improve Outcome: Progressing Goal: Individualized Educational Video(s) Outcome: Progressing   Problem: Coping: Goal: Will verbalize positive feelings about self Outcome: Progressing Goal: Will identify appropriate support needs Outcome: Progressing   Problem: Health Behavior/Discharge Planning: Goal: Ability to manage health-related needs will improve Outcome: Progressing   Problem: Self-Care: Goal: Ability to participate in self-care as condition permits will improve Outcome: Progressing Goal: Verbalization of feelings and concerns over difficulty with self-care will improve Outcome: Progressing Goal: Ability to communicate needs accurately will improve Outcome: Progressing   Problem: Nutrition: Goal: Risk of aspiration will decrease Outcome: Progressing Goal: Dietary intake will improve Outcome: Progressing   Problem: Ischemic Stroke/TIA Tissue Perfusion: Goal: Complications of ischemic stroke/TIA will be minimized Outcome: Progressing   Problem: Education: Goal: Knowledge of General Education information will improve Description: Including pain rating scale, medication(s)/side effects and non-pharmacologic comfort measures Outcome: Progressing   Problem: Health Behavior/Discharge Planning: Goal: Ability to manage health-related needs will improve Outcome: Progressing   Problem: Clinical Measurements: Goal: Ability to maintain clinical measurements within normal limits will improve Outcome: Progressing Goal: Will remain free from infection Outcome: Progressing Goal: Diagnostic test results will improve Outcome: Progressing Goal: Respiratory complications will improve Outcome: Progressing Goal:  Cardiovascular complication will be avoided Outcome: Progressing   Problem: Activity: Goal: Risk for activity intolerance will decrease Outcome: Progressing   Problem: Nutrition: Goal: Adequate nutrition will be maintained Outcome: Progressing   Problem: Coping: Goal: Level of anxiety will decrease Outcome: Progressing   Problem: Elimination: Goal: Will not experience complications related to bowel motility Outcome: Progressing Goal: Will not experience complications related to urinary retention Outcome: Progressing   Problem: Pain Managment: Goal: General experience of comfort will improve Outcome: Progressing   Problem: Safety: Goal: Ability to remain free from injury will improve Outcome: Progressing   Problem: Skin Integrity: Goal: Risk for impaired skin integrity will decrease Outcome: Progressing   Meegan Shanafelt, BSN, RN 

## 2019-01-27 NOTE — Progress Notes (Signed)
EEG complete - results pending 

## 2019-01-27 NOTE — ED Notes (Signed)
The pts son called back  And asked me to give the pt a message from him   The son updated

## 2019-01-27 NOTE — ED Notes (Signed)
Patient contact updated.

## 2019-01-27 NOTE — Progress Notes (Signed)
  Echocardiogram 2D Echocardiogram has been performed.  Burnett Kanaris 01/27/2019, 1:25 PM

## 2019-01-27 NOTE — Progress Notes (Signed)
LE venous duplex       has been completed. Preliminary results can be found under CV proc through chart review. Jess Toney, BS, RDMS, RVT   

## 2019-01-27 NOTE — ED Notes (Signed)
Please call the son Terrah Hogeland at [phone]

## 2019-01-27 NOTE — H&P (Signed)
History and Physical    ADRIAHNA RASSO [id number] DOB: 14-Jun-1923 DOA: 01/26/2019  PCP: Velna Hatchet, MD Patient coming from: Home  Chief Complaint: Dizziness, nausea  HPI: Danielle Bryan is a 85 y.o. 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  Patient states she woke up yesterday morning 12/2 around 8 AM and as she was trying to sit up in the bed started feeling dizzy and nauseous.  She vomited a little at that time.  Since yesterday she has continued to feel dizzy and nauseous.  She has not been able to ambulate without support.  She has not had any focal weakness or numbness.  No headaches or changes in vision.  She describes the dizziness as a room spinning sensation.  It is worse every time she moves her head.  She has not had any abdominal pain or diarrhea.  No fevers, chills, shortness of breath, or cough.  ED Course: Patient was extremely symptomatic in the ED and was not able to ambulate.  MRI concerning for unilateral right hippocampal restriction diffusion and swelling.  No associated hemorrhage or mass-effect.  Findings favor acute ischemia. ED provider discussed the case with Dr. Lorraine Lax who reviewed the images and agrees that this is an acute ischemic change but does not feel it is the cause of her vertigo.  Neurology will see the patient in consult. She was given aspirin 324 mg, meclizine, Zofran, and a 500 cc normal saline bolus.  Review of Systems:  All systems reviewed and apart from history of presenting illness, are negative.  Past Medical History:  Diagnosis Date   Hypercholesterolemia    Hypertension    Hypothyroidism    Osteoarthritis    Reactive depression (situational)    Stroke ([place])    TIA (transient ischemic attack)    on plavix    Past Surgical History:  Procedure Laterality Date   CATARACT EXTRACTION Left 1992   CATARACT EXTRACTION  Right 1995   DEBRIDEMENT AND CLOSURE WOUND       reports that she has never smoked. She has never used smokeless tobacco. She reports that she does not drink alcohol or use drugs.  Allergies  Allergen Reactions   Escitalopram Oxalate Nausea Only   Lisinopril Other (See Comments)    Very decreased blood pressure    Statins     myalgias   Sulfa Antibiotics     hives    Family History  Problem Relation Age of Onset   Heart failure Mother    Pneumonia Mother    Heart failure Father     Prior to Admission medications   Medication Sig Start Date End Date Taking? Authorizing Provider  acetaminophen (TYLENOL) 325 MG tablet Take 325 mg by mouth every 6 (six) hours as needed for mild pain.  01/09/15   [provider]  amLODipine (NORVASC) 5 MG tablet Take 5 mg by mouth daily. 03/18/16   [provider]  Calcium Carb-Cholecalciferol (CALCIUM 600/VITAMIN D3) 600-800 MG-UNIT TABS Take 1 tablet by mouth 2 (two) times daily.     [provider]  clopidogrel (PLAVIX) 75 MG tablet Take 1 tablet (75 mg total) by mouth daily. 03/21/14   Velna Hatchet, MD  divalproex (DEPAKOTE ER) 500 MG 24 hr tablet Take 1 tablet (500 mg total) by mouth daily. 02/11/18   Frann Rider, NP  levothyroxine (SYNTHROID, LEVOTHROID) 100 MCG tablet TAKE 1 TABLET (100 MCG TOTAL) BY MOUTH  DAILY. TAKING 6 DAYS A WEEK. DON'T TAKE ON Meribeth Mattes, MD  Multiple Vitamin (MULTIVITAMIN WITH MINERALS) TABS tablet Take 1 tablet by mouth daily.    [provider]  pantoprazole (PROTONIX) 40 MG tablet Take 40 mg by mouth daily.    [provider]  Pitavastatin Calcium (LIVALO) 2 MG TABS Take 1 tablet (2 mg total) by mouth once a week. Take 2 mg by mouth twice a week. 03/21/16   Eugenie Filler, MD  Vitamins A & D (VITAMIN A & D) 10000-400 units TABS Use as directed 1,000 Units in the mouth or throat daily. 03/05/16   [provider]    Physical  Exam: Vitals:   01/26/19 2245 01/27/19 0235 01/27/19 0235 01/27/19 0430  BP: (!) 141/80  (!) 164/89 (!) 147/69  Pulse: 94  84 82  Resp: 13  15 14   Temp:  98.2 F (36.8 C) 98.2 F (36.8 C)   TempSrc:  Oral Oral   SpO2: 99%  99% 97%    Physical Exam  Constitutional: She is oriented to person, place, and time. She appears well-developed and well-nourished. No distress.  HENT:  Head: Normocephalic.  Eyes: EOM are normal.  Mild bidirectional horizontal nystagmus  Neck: Neck supple.  Cardiovascular: Normal rate, regular rhythm and intact distal pulses.  Pulmonary/Chest: Effort normal and breath sounds normal. No respiratory distress. She has no wheezes. She has no rales.  Abdominal: Soft. Bowel sounds are normal. She exhibits no distension. There is no abdominal tenderness. There is no guarding.  Musculoskeletal:        General: No edema.  Neurological: She is alert and oriented to person, place, and time. No cranial nerve deficit.  Speech fluent, tongue midline, no facial droop Strength 5 out of 5 in bilateral upper and lower extremities. Sensation to light touch intact throughout.  Skin: Skin is warm and dry. She is not diaphoretic.     Labs on Admission: I have personally reviewed following labs and imaging studies  CBC: Recent Labs  Lab 01/26/19 2009 01/26/19 2046  WBC 7.4  --   NEUTROABS 4.3  --   HGB 13.5 12.9  HCT 41.5 38.0  MCV 93.3  --   PLT 158  --    Basic Metabolic Panel: Recent Labs  Lab 01/26/19 2009 01/26/19 2046  NA 139 136  K 3.6 3.9  CL 100  --   CO2 24  --   GLUCOSE 125*  --   BUN 13  --   CREATININE 0.59  --   CALCIUM 10.0  --    GFR: CrCl cannot be calculated (Unknown ideal weight.). Liver Function Tests: No results for input(s): AST, ALT, ALKPHOS, BILITOT, PROT, ALBUMIN in the last 168 hours. No results for input(s): LIPASE, AMYLASE in the last 168 hours. No results for input(s): AMMONIA in the last 168 hours. Coagulation Profile: No  results for input(s): INR, PROTIME in the last 168 hours. Cardiac Enzymes: No results for input(s): CKTOTAL, CKMB, CKMBINDEX, TROPONINI in the last 168 hours. BNP (last 3 results) No results for input(s): PROBNP in the last 8760 hours. HbA1C: No results for input(s): HGBA1C in the last 72 hours. CBG: No results for input(s): GLUCAP in the last 168 hours. Lipid Profile: No results for input(s): CHOL, HDL, LDLCALC, TRIG, CHOLHDL, LDLDIRECT in the last 72 hours. Thyroid Function Tests: No results for input(s): TSH, T4TOTAL, FREET4, T3FREE, THYROIDAB in the last 72 hours. Anemia Panel: No results for  input(s): VITAMINB12, FOLATE, FERRITIN, TIBC, IRON, RETICCTPCT in the last 72 hours. Urine analysis:    Component Value Date/Time   COLORURINE STRAW (A) 01/26/2019 2250   APPEARANCEUR CLEAR 01/26/2019 2250   LABSPEC 1.006 01/26/2019 2250   PHURINE 7.0 01/26/2019 2250   GLUCOSEU NEGATIVE 01/26/2019 2250   HGBUR NEGATIVE 01/26/2019 2250   BILIRUBINUR NEGATIVE 01/26/2019 2250   KETONESUR 20 (A) 01/26/2019 2250   PROTEINUR NEGATIVE 01/26/2019 2250   UROBILINOGEN 0.2 03/21/2014 1209   NITRITE NEGATIVE 01/26/2019 2250   LEUKOCYTESUR NEGATIVE 01/26/2019 2250    Radiological Exams on Admission: Mr Brain Wo Contrast  Result Date: 01/27/2019 CLINICAL DATA:  85 year old female with acute onset dizziness 12-24 hours ago. No mention of seizure like activity. EXAM: MRI HEAD WITHOUT CONTRAST TECHNIQUE: Multiplanar, multiecho pulse sequences of the brain and surrounding structures were obtained without intravenous contrast. COMPARISON:  Brain MRI 03/20/2016. FINDINGS: Brain: Confluent restricted diffusion throughout the head and body of the right hippocampus (series 7, image 52). Associated T2 hyperintensity and mild hippocampal swelling on series 17, image 15. No associated hemorrhage or regional mass effect. No other restricted diffusion. No midline shift, mass effect, evidence of mass lesion,  ventriculomegaly, extra-axial collection or acute intracranial hemorrhage. Cervicomedullary junction and pituitary are within normal limits. There is patchy chronic encephalomalacia at the right frontal operculum, stable. Patchy and confluent superimposed bilateral cerebral white matter T2 and FLAIR hyperintensity is stable since 2018. Similar patchy T2 signal in the bilateral deep gray nuclei and pons also appears stable. Cerebellum remains negative. There are new chronic microhemorrhages in the central pons and the left deep cerebellar nuclei since 2018 (series 14, image 20, 13). No other cortical encephalomalacia or chronic cerebral blood products identified. Vascular: Major intracranial vascular flow voids are stable. Skull and upper cervical spine: Normal for age visible cervical spine. Normal bone marrow signal. Sinuses/Orbits: Stable and negative. Other: Mastoids remain clear. Visible internal auditory structures appear normal. Scalp and face soft tissues appear negative. IMPRESSION: 1. Confluent unilateral right hippocampal restricted diffusion and swelling. No associated hemorrhage or mass effect. In this clinical setting I favor this reflects acute ischemia. 2. Underlying chronic small vessel disease with mild progression since 2018. Electronically Signed   By: Genevie Ann M.D.   On: 01/27/2019 01:29    EKG: Independently reviewed.  Sinus rhythm, no significant change since prior tracing.  Assessment/Plan Principal Problem:   CVA (cerebral vascular accident) ([place]) Active Problems:   HTN (hypertension)   Vertigo   Acute CVA, vertigo Patient is presenting with complaints of dizziness and nausea.  Symptom onset 12/2 around 8 AM.  She has a history of prior CVA/TIA on Plavix.  MRI concerning for unilateral right hippocampal restriction diffusion and swelling.  No associated hemorrhage or mass-effect.  Findings favor acute ischemia. ED provider discussed the case with Dr. Lorraine Lax from neurology who  reviewed the patient's imaging and agrees that this is an acute ischemic change but does not feel it is the cause of her vertigo.  BPPV is also on the differential for vertigo. -Telemetry monitoring -Allow for permissive hypertension-treat only if systolic blood pressures are greater than 220. -2D echocardiogram -Hemoglobin A1c, fasting lipid panel -Received aspirin 324 mg daily -Frequent neurochecks -PT, OT, speech therapy. -N.p.o. until cleared by bedside swallow evaluation or formal speech evaluation -Neurology will see the patient, recommendations pending -Continue meclizine -Antiemetic as needed  Hypertension -Allow permissive hypertension at this time  Pharmacy med rec pending.  DVT prophylaxis: Lovenox Code Status: Patient  wishes to be full code. Family Communication: No family available. Disposition Plan: Anticipate discharge after clinical improvement. Consults called: Neurology Admission status: It is my clinical opinion that admission to INPATIENT is reasonable and necessary in this 85 y.o. female  presenting with symptoms of vertigo and MRI concerning for acute CVA.  Stroke work-up pending.  Given the aforementioned, the predictability of an adverse outcome is felt to be significant. I expect that the patient will require at least 2 midnights in the hospital to treat this condition.   The medical decision making on this patient was of high complexity and the patient is at high risk for clinical deterioration, therefore this is a level 3 visit.  Shela Leff MD [facility] Pager [phone]  If 7PM-7AM, please contact night-coverage [email or web address] Password [facility]  01/27/2019, 4:43 AM

## 2019-01-27 NOTE — Progress Notes (Addendum)
STROKE TEAM PROGRESS NOTE   INTERVAL HISTORY No family at bedside. Pt sitting in bed for lunch. She stated that she got up from bed yesterday and had terrible vertigo. She had to lie back down, but soon as she got up she will have vertigo again. Even she lay down, dizziness was not getting away. Finally, her son came by sent her to ER. She can not tell me how long the vertigo last, but I do not feel she has vertigo now but she still tell me she felt dizzy.   Vitals:   01/27/19 0733 01/27/19 0745 01/27/19 0845 01/27/19 0900  BP:  (!) 143/79  (!) 167/77  Pulse: 92 90 92 94  Resp: 14 (!) 21 14 18   Temp:      TempSrc:      SpO2: 99% 99% 100% 100%    CBC:  Recent Labs  Lab 01/26/19 2009 01/26/19 2046 01/27/19 1000  WBC 7.4  --  8.0  NEUTROABS 4.3  --  5.3  HGB 13.5 12.9 13.5  HCT 41.5 38.0 42.0  MCV 93.3  --  94.0  PLT 158  --  [id number]    Basic Metabolic Panel:  Recent Labs  Lab 01/26/19 2009 01/26/19 2046 01/27/19 1000  NA 139 136 138  K 3.6 3.9 3.5  CL 100  --  102  CO2 24  --  24  GLUCOSE 125*  --  199*  BUN 13  --  8  CREATININE 0.59  --  0.68  CALCIUM 10.0  --  9.2  MG  --   --  1.8  PHOS  --   --  2.4*   Lipid Panel:     Component Value Date/Time   CHOL 280 (H) 01/27/2019 0537   TRIG 74 01/27/2019 0537   HDL 86 01/27/2019 0537   CHOLHDL 3.3 01/27/2019 0537   VLDL 15 01/27/2019 0537   LDLCALC 179 (H) 01/27/2019 0537   HgbA1c:  Lab Results  Component Value Date   HGBA1C 5.7 (H) 01/27/2019   Urine Drug Screen: No results found for: LABOPIA, COCAINSCRNUR, LABBENZ, AMPHETMU, THCU, LABBARB  Alcohol Level No results found for: ETH  IMAGING Ct Angio Head W Or Wo Contrast  Result Date: 01/27/2019 CLINICAL DATA:  Stroke, follow-up EXAM: CT ANGIOGRAPHY HEAD AND NECK TECHNIQUE: Multidetector CT imaging of the head and neck was performed using the standard protocol before and during bolus administration of intravenous contrast. Multiplanar CT image reconstructions  and MIPs were obtained to evaluate the vascular anatomy. Carotid stenosis measurements (when applicable) are obtained utilizing NASCET criteria, using the distal internal carotid diameter as the denominator. CONTRAST:  76mL OMNIPAQUE IOHEXOL 350 MG/ML SOLN COMPARISON:  Prior CT and MR imaging FINDINGS: CT HEAD FINDINGS Brain: Ill-defined hypoattenuation is present along the right hippocampus corresponding to abnormal signal on prior MRI. There is no acute intracranial hemorrhage. Small area of chronic infarction is again identified involving the lateral right frontoparietal lobes. Additional patchy and confluent areas of hypoattenuation in the supratentorial white matter likely reflect stable chronic microvascular ischemic changes. Prominence of the ventricles and sulci reflects stable parenchymal volume loss. Vascular: No hyperdense vessel. There is intracranial atherosclerotic calcification at the skull base. Skull: Unremarkable. Sinuses: Aerated. Orbits: No acute finding. Review of the MIP images confirms the above findings CTA NECK FINDINGS Aortic arch: Great vessel origins are patent. Right carotid system: Common carotid is patent. Primarily calcified plaque is present at the bifurcation causing minimal stenosis. Internal and external carotid  arteries are patent. No hemodynamically significant stenosis or evidence of dissection. Left carotid system: Common carotid is patent. Primarily calcified plaque is present at the bifurcation causing mild, less than 50% stenosis. Internal and external carotid arteries are patent. No hemodynamically significant stenosis or evidence of dissection. Vertebral arteries: Patent. Right vertebral artery is dominant. No significant stenosis or evidence of dissection. Skeleton: Degenerative changes of the included spine. Other neck: Multinodular thyroid. Upper chest: No apical lung mass. Review of the MIP images confirms the above findings CTA HEAD FINDINGS Anterior circulation:  Intracranial internal carotid arteries are patent with calcified plaque but no significant stenosis. Anterior and middle cerebral arteries are patent. Multifocal stenoses are present of a M2 MCA, A2 ACA, and more distal branches bilaterally. Several of these are severe. Posterior circulation: Intracranial right vertebral artery is patent. The intracranial left vertebral artery appears to be chronically occluded beyond the PICA origin. Basilar artery is patent. Posterior cerebral arteries are patent. There is a right posterior communicating artery present. There is mild stenosis of the basilar artery. There is irregularity with mild to moderate stenoses of the posterior cerebral arteries. There is a more distal high-grade stenosis of an occipital branch on the right. Venous sinuses: Patent as permitted by contrast timing. Review of the MIP images confirms the above findings IMPRESSION: No acute intracranial hemorrhage. Recent right hippocampal infarction is better seen on MRI. Stable chronic findings detailed above. Significant intracranial atherosclerosis primarily involving medium and smaller branch vessels. No hemodynamically significant stenosis in the neck. Electronically Signed   By: Macy Mis M.D.   On: 01/27/2019 09:41   Ct Angio Neck W Or Wo Contrast  Result Date: 01/27/2019 CLINICAL DATA:  Stroke, follow-up EXAM: CT ANGIOGRAPHY HEAD AND NECK TECHNIQUE: Multidetector CT imaging of the head and neck was performed using the standard protocol before and during bolus administration of intravenous contrast. Multiplanar CT image reconstructions and MIPs were obtained to evaluate the vascular anatomy. Carotid stenosis measurements (when applicable) are obtained utilizing NASCET criteria, using the distal internal carotid diameter as the denominator. CONTRAST:  46mL OMNIPAQUE IOHEXOL 350 MG/ML SOLN COMPARISON:  Prior CT and MR imaging FINDINGS: CT HEAD FINDINGS Brain: Ill-defined hypoattenuation is present  along the right hippocampus corresponding to abnormal signal on prior MRI. There is no acute intracranial hemorrhage. Small area of chronic infarction is again identified involving the lateral right frontoparietal lobes. Additional patchy and confluent areas of hypoattenuation in the supratentorial white matter likely reflect stable chronic microvascular ischemic changes. Prominence of the ventricles and sulci reflects stable parenchymal volume loss. Vascular: No hyperdense vessel. There is intracranial atherosclerotic calcification at the skull base. Skull: Unremarkable. Sinuses: Aerated. Orbits: No acute finding. Review of the MIP images confirms the above findings CTA NECK FINDINGS Aortic arch: Great vessel origins are patent. Right carotid system: Common carotid is patent. Primarily calcified plaque is present at the bifurcation causing minimal stenosis. Internal and external carotid arteries are patent. No hemodynamically significant stenosis or evidence of dissection. Left carotid system: Common carotid is patent. Primarily calcified plaque is present at the bifurcation causing mild, less than 50% stenosis. Internal and external carotid arteries are patent. No hemodynamically significant stenosis or evidence of dissection. Vertebral arteries: Patent. Right vertebral artery is dominant. No significant stenosis or evidence of dissection. Skeleton: Degenerative changes of the included spine. Other neck: Multinodular thyroid. Upper chest: No apical lung mass. Review of the MIP images confirms the above findings CTA HEAD FINDINGS Anterior circulation: Intracranial internal carotid arteries are  patent with calcified plaque but no significant stenosis. Anterior and middle cerebral arteries are patent. Multifocal stenoses are present of a M2 MCA, A2 ACA, and more distal branches bilaterally. Several of these are severe. Posterior circulation: Intracranial right vertebral artery is patent. The intracranial left  vertebral artery appears to be chronically occluded beyond the PICA origin. Basilar artery is patent. Posterior cerebral arteries are patent. There is a right posterior communicating artery present. There is mild stenosis of the basilar artery. There is irregularity with mild to moderate stenoses of the posterior cerebral arteries. There is a more distal high-grade stenosis of an occipital branch on the right. Venous sinuses: Patent as permitted by contrast timing. Review of the MIP images confirms the above findings IMPRESSION: No acute intracranial hemorrhage. Recent right hippocampal infarction is better seen on MRI. Stable chronic findings detailed above. Significant intracranial atherosclerosis primarily involving medium and smaller branch vessels. No hemodynamically significant stenosis in the neck. Electronically Signed   By: Macy Mis M.D.   On: 01/27/2019 09:41   Mr Brain Wo Contrast  Result Date: 01/27/2019 CLINICAL DATA:  85 year old female with acute onset dizziness 12-24 hours ago. No mention of seizure like activity. EXAM: MRI HEAD WITHOUT CONTRAST TECHNIQUE: Multiplanar, multiecho pulse sequences of the brain and surrounding structures were obtained without intravenous contrast. COMPARISON:  Brain MRI 03/20/2016. FINDINGS: Brain: Confluent restricted diffusion throughout the head and body of the right hippocampus (series 7, image 52). Associated T2 hyperintensity and mild hippocampal swelling on series 17, image 15. No associated hemorrhage or regional mass effect. No other restricted diffusion. No midline shift, mass effect, evidence of mass lesion, ventriculomegaly, extra-axial collection or acute intracranial hemorrhage. Cervicomedullary junction and pituitary are within normal limits. There is patchy chronic encephalomalacia at the right frontal operculum, stable. Patchy and confluent superimposed bilateral cerebral white matter T2 and FLAIR hyperintensity is stable since 2018. Similar  patchy T2 signal in the bilateral deep gray nuclei and pons also appears stable. Cerebellum remains negative. There are new chronic microhemorrhages in the central pons and the left deep cerebellar nuclei since 2018 (series 14, image 20, 13). No other cortical encephalomalacia or chronic cerebral blood products identified. Vascular: Major intracranial vascular flow voids are stable. Skull and upper cervical spine: Normal for age visible cervical spine. Normal bone marrow signal. Sinuses/Orbits: Stable and negative. Other: Mastoids remain clear. Visible internal auditory structures appear normal. Scalp and face soft tissues appear negative. IMPRESSION: 1. Confluent unilateral right hippocampal restricted diffusion and swelling. No associated hemorrhage or mass effect. In this clinical setting I favor this reflects acute ischemia. 2. Underlying chronic small vessel disease with mild progression since 2018. Electronically Signed   By: Genevie Ann M.D.   On: 01/27/2019 01:29    PHYSICAL EXAM  Temp:  [98.2 F (36.8 C)] 98.2 F (36.8 C) (12/03 0235) Pulse Rate:  [82-105] 96 (12/03 1030) Resp:  [9-22] 22 (12/03 1030) BP: (132-182)/(67-89) 147/70 (12/03 1030) SpO2:  [94 %-100 %] 99 % (12/03 1030)  General - Well nourished, well developed, in no apparent distress.  Ophthalmologic - fundi not visualized due to noncooperation.  Cardiovascular - Regular rhythm and rate.  Mental Status -  Level of arousal and orientation to year, place, age, self and person were intact, however not able to tell me the month. Language including expression, naming, repetition, comprehension was assessed and found intact.  Cranial Nerves II - XII - II - Visual field intact OU. III, IV, VI - Extraocular movements intact. V -  Facial sensation intact bilaterally. VII - Facial movement intact bilaterally. VIII - Hearing & vestibular intact bilaterally, no nystagmus. X - Palate elevates symmetrically. XI - Chin turning &  shoulder shrug intact bilaterally. XII - Tongue protrusion intact.  Motor Strength - The patient's strength was symmetrical in all extremities and pronator drift was absent.  Bulk was normal and fasciculations were absent.   Motor Tone - Muscle tone was assessed at the neck and appendages and was normal.  Reflexes - The patient's reflexes were symmetrical in all extremities and she had no pathological reflexes.  Sensory - Light touch, temperature/pinprick were assessed and were symmetrical.    Coordination - The patient had normal movements in the hands with no ataxia or dysmetria.  Tremor was present at rest bilaterally. Dix-Hallpike did not perform due to pt refusal at this time    Gait and Station - deferred.    ASSESSMENT/PLAN [name] is a 85 y.o. female with history of CVA, HTN, HLD presenting with vertigo/dizziness..   Stroke:   R hippocampal infarct secondary to intracranial atherosclerosis. However, can not rule out embolic source.   MRI  R hippocampal infarct. Small vessel disease. Atrophy.   CTA head and neck significant intracranial medium to small sized vessel  atherosclerosis   2D Echo pending  LE venous doppler pending  EEG pending  Will consider loop recorder or 30 day monitoring if above work up neg  LDL 179  HgbA1c 5.7  Lovenox 30 mg sq daily for VTE prophylaxis  clopidogrel 75 mg daily prior to admission, now on aspirin 81 mg daily and clopidogrel 75 mg daily. Continue DAPT x 3 weeks then back to plavix alone.   Therapy recommendations:  pending   Disposition:  pending   Hx stroke/TIA  02/2016 - L brain TIA vs. Sz - speech difficulties.  MRI negative for acute stroke but old lacunar infarcts.  MRA intracranial atherosclerosis.  Carotid Doppler negative.  EF 65 to 70%.  LDL 103.  EEG no seizure.  Concerning for TIA versus seizure.  She was put on DAPT and also Depakote for seizure prophylaxis.  02/2014 - L brain TIA - slurred speech and  expressive aphasia. MRI negative.  EEG no seizure.  Considered L brain TIA, LDL 131 and A1c 5.4.  Put on aspirin.  Vertigo  Pt current stroke seems not able to explain vertigo  Some feature consistent with BPPV  Either there is brainstem infarct not showing up on MRI or concomitant BPPV  Will do Dix-Hallpike tomorrow  PT follow up   Hypertension  Stable . Permissive hypertension (OK if < 220/120) but gradually normalize in 5-7 days . Long-term BP goal normotensive  Hyperlipidemia  Home meds: Pitavastatin  Pt intolerant to statins (myalgias)  LDL 179, goal < 70  Will further discuss with her, either increase her pitavastatin dose or add zetia.  Other Stroke Risk Factors  Advanced age  Other Active Problems  Hypothyroid on [facility] day # 0  Rosalin Hawking, MD PhD Stroke Neurology 01/27/2019 2:55 PM  To contact Stroke Continuity provider, please refer to [email or web address] After hours, contact General Neurology

## 2019-01-27 NOTE — ED Notes (Signed)
Patient transported to CT 

## 2019-01-27 NOTE — ED Notes (Signed)
Pt's Son updated on pt's plan of care by Dorothea Ogle, RN

## 2019-01-28 DIAGNOSIS — H8110 Benign paroxysmal vertigo, unspecified ear: Secondary | ICD-10-CM

## 2019-01-28 DIAGNOSIS — I6782 Cerebral ischemia: Secondary | ICD-10-CM

## 2019-01-28 DIAGNOSIS — R42 Dizziness and giddiness: Secondary | ICD-10-CM

## 2019-01-28 LAB — COMPREHENSIVE METABOLIC PANEL
ALT: 8 U/L (ref 0–44)
AST: 20 U/L (ref 15–41)
Albumin: 3.3 g/dL — ABNORMAL LOW (ref 3.5–5.0)
Alkaline Phosphatase: 44 U/L (ref 38–126)
Anion gap: 8 (ref 5–15)
BUN: 9 mg/dL (ref 8–23)
CO2: 27 mmol/L (ref 22–32)
Calcium: 9.2 mg/dL (ref 8.9–10.3)
Chloride: 104 mmol/L (ref 98–111)
Creatinine, Ser: 0.74 mg/dL (ref 0.44–1.00)
GFR calc Af Amer: >60 mL/min (ref >60)
GFR calc non Af Amer: >60 mL/min (ref >60)
Glucose, Bld: 106 mg/dL — ABNORMAL HIGH (ref 70–99)
Potassium: 3.5 mmol/L (ref 3.5–5.1)
Sodium: 139 mmol/L (ref 135–145)
Total Bilirubin: 0.8 mg/dL (ref 0.3–1.2)
Total Protein: 5.8 g/dL — ABNORMAL LOW (ref 6.5–8.1)

## 2019-01-28 LAB — CBC WITH DIFFERENTIAL/PLATELET
Abs Immature Granulocytes: 0.02 10*3/uL (ref 0.00–0.07)
Basophils Absolute: 0 10*3/uL (ref 0.0–0.1)
Basophils Relative: 0 %
Eosinophils Absolute: 0.2 10*3/uL (ref 0.0–0.5)
Eosinophils Relative: 3 %
HCT: 37.4 % (ref 36.0–46.0)
Hemoglobin: 12.3 g/dL (ref 12.0–15.0)
Immature Granulocytes: 0 %
Lymphocytes Relative: 39 %
Lymphs Abs: 2.4 10*3/uL (ref 0.7–4.0)
MCH: 30.2 pg (ref 26.0–34.0)
MCHC: 32.9 g/dL (ref 30.0–36.0)
MCV: 91.9 fL (ref 80.0–100.0)
Monocytes Absolute: 0.9 10*3/uL (ref 0.1–1.0)
Monocytes Relative: 14 %
Neutro Abs: 2.7 10*3/uL (ref 1.7–7.7)
Neutrophils Relative %: 44 %
Platelets: 151 10*3/uL (ref 150–400)
RBC: 4.07 MIL/uL (ref 3.87–5.11)
RDW: 13.2 % (ref 11.5–15.5)
WBC: 6.2 10*3/uL (ref 4.0–10.5)
nRBC: 0 % (ref 0.0–0.2)

## 2019-01-28 LAB — MAGNESIUM: Magnesium: 1.7 mg/dL (ref 1.7–2.4)

## 2019-01-28 LAB — T4, FREE: Free T4: 1.08 ng/dL (ref 0.61–1.12)

## 2019-01-28 LAB — PHOSPHORUS: Phosphorus: 3.3 mg/dL (ref 2.5–4.6)

## 2019-01-28 LAB — TSH: TSH: 16.178 u[IU]/mL — ABNORMAL HIGH (ref 0.350–4.500)

## 2019-01-28 LAB — SARS CORONAVIRUS 2 (TAT 6-24 HRS): SARS Coronavirus 2: NEGATIVE

## 2019-01-28 NOTE — Progress Notes (Signed)
PROGRESS NOTE    Danielle Bryan  [id number] DOB: [birth year] DOA: 01/26/2019 PCP: Velna Hatchet, MD   Brief Narrative:  The patient is a 85 year old Caucasian elderly female with a past medical history significant for but not limited to CVA and TIA on Plavix, hypertension, hyperlipidemia, hypothyroidism, osteoarthritis, situational depression, and other comorbidities who presented with a chief complaint of dizziness and nausea.  She woke up yesterday morning at 12 to around 8 AM and was trying to sit up in the bed started feeling dizzy and nauseous.  She states she vomited a little bit and has continued to feel dizzy and nauseous and has not been able to ambulate without support.  She denies any focal weakness or numbness and denies any headaches changes in vision but describes the dizziness as a room spinning sensation.  She stated that is worse every time she moves her head and because she was extremely symptomatic she is brought to the emergency room and she is not able to ambulate.  MRI was done was concerning for a unilateral hippocampal restriction diffusion is swelling with no associated hemorrhage or mass-effect these findings are favoring acute ischemia.  The ED provider paged and discussed the case with Dr. Lorraine Lax of neurology who reviewed the images and agrees that there is an acute ischemic change but did not feel that it is the cause of her vertigo. Neurology formally consulted and she was given aspirin 324 mg, Zofran and a 500 mL bolus of normal saline  Further work-up revealed she had some BPPV if she had a positive.  PT worked with her and they recommend skilled nursing facility.  Neurology recommended for precautionary for her stroke and recommended dual antiplatelet therapy for 3 weeks and then just back to monotherapy with clopidogrel.  We are currently looking for a skilled nursing facility for the patient given her rehab needs.  Assessment & Plan:   Principal Problem:   CVA  (cerebral vascular accident) (Ali Molina) Active Problems:   HTN (hypertension)   Vertigo  Acute right hippocampal CVA, Vertigo from BPPV -Patient is presenting with complaints of dizziness and nausea but neurology does not feel like the CVA is related to her dizziness -Symptom onset 12/2 around 8 AM.  -She has a history of prior CVA/TIA on Plavix.  -MRI concerning for unilateral right hippocampal restriction diffusion and swelling. No associated hemorrhage or mass-effect. Findings favor acute ischemia.  -ED provider discussed the case with Dr. Merrily Pew neurology who reviewed the patient's imaging and agrees that this is an acute ischemic change but does not feel it is the cause of her vertigo. BPPV is also on the differential for vertigo. -C/w Telemetry monitoring to evaluate for possible atrial fibrillation -Allow for permissive hypertension-treat only if systolic blood pressures are greater than 220. -CT Head and Neck done and showed "No acute intracranial hemorrhage. Recent right hippocampal infarction is better seen on MRI. Stable chronic findings detailed above. Significant intracranial atherosclerosis primarily involving medium and smaller branch vessels. No hemodynamically significant stenosis in the neck." -TransThoracic 2D Echocardiogram ordered and to be done; EEG also being done by Neurology along with LE VTE -Neurology recommending Continuing ASA 81 mgand Plavix 75 mg daily -Hemoglobin A1c was 5.7, Fasting lipid panel -Lipid Panel as below -Received Aspirin 324 mg daily: Neurology also recommending starting high intensity statin but ? Intolerance so will need to discuss and Start Statin in AM  -Frequent neurochecks -PT, OT, speech therapy. PT to do Vestibular Testing; Had a Positive Dix-Hallpike  Maneuver and will need SNF for Rehab  -C/w Fall Precautions  -N.p.o. until cleared by bedside swallow evaluation or formal speech evaluation and had no needs  -Neurology will see the  patient and recommending DAPT with ASA and Plavix for 3 weeks and then Plavix daily  -EEG showed no Seizure  -Continue Meclizine -Antiemetic as needed -Patient passed bedside swallow screen so we will start on a heart healthy diet -May need a Loop Recorder per Neuro but after review of her Imaging they feel she has very stenosis'd intracranial vessels and for now they are recommending a 30-day monitor rule out atrial fibrillation in outpatient setting -Dizziness is improving -Had a lower extremity duplex done and showed no evidence of DVT -ECHOCARDIOGRAM showed "Left ventricular ejection fraction, by visual estimation, is 60 to 65%. The left ventricle has normal function. There is no left ventricular hypertrophy.  2. Left ventricular diastolic parameters are consistent with Grade I diastolic dysfunction (impaired relaxation)." -Resume Statin at D/C and increase dose to 4 mg weekly per Neurology   Hypertension -Allow Permissive Hypertension at this time for systolic less than [id number] and gradually normalizing 5 to 7 days with long-term blood pressure goal normotensive  Hx of Seizure Disorder -Continue Divalproex 500 mg p.o. daily and Neurology checking an EEG -EEG as below   Hypothyroidism -Check TSH and was 16.178 but free T4 was 1.08 -Continue with levothyroxine 100 mcg  Hyperlipidemia -Lipid Panel done and showed a total cholesterol/HDL ratio of 3.3, cholesterol level was 280, HDL was 86, LDL was 179, triglycerides were 74, and VLDL 15 -Takes Pitavastatin at home weekly and Neurology recommending to increase dose to 4 mg Weekly at D/C   DVT prophylaxis: Enoxaparin 30 mg sq q24h Code Status: FULL CODE Family Communication: No family present at bedside during examination but spoke with son last night  Disposition Plan: SNF  Consultants:  Neurology    Procedures:  ECHOCARDIOGRAM IMPRESSIONS    1. Left ventricular ejection fraction, by visual estimation, is 60 to 65%. The  left ventricle has normal function. There is no left ventricular hypertrophy.  2. Left ventricular diastolic parameters are consistent with Grade I diastolic dysfunction (impaired relaxation).  3. Global right ventricle has normal systolic function.The right ventricular size is normal. No increase in right ventricular wall thickness.  4. Left atrial size was normal.  5. Right atrial size was normal.  6. Mild mitral annular calcification.  7. The mitral valve is normal in structure. No evidence of mitral valve regurgitation. No evidence of mitral stenosis.  8. The tricuspid valve is normal in structure. Tricuspid valve regurgitation is mild.  9. The aortic valve is tricuspid. Aortic valve regurgitation is not visualized. Mild aortic valve sclerosis without stenosis. 10. The pulmonic valve was normal in structure. Pulmonic valve regurgitation is not visualized. 11. Normal pulmonary artery systolic pressure. 12. The inferior vena cava is normal in size with greater than 50% respiratory variability, suggesting right atrial pressure of 3 mmHg.  FINDINGS  Left Ventricle: Left ventricular ejection fraction, by visual estimation, is 60 to 65%. The left ventricle has normal function. There is no left ventricular hypertrophy. Left ventricular diastolic parameters are consistent with Grade I diastolic  dysfunction (impaired relaxation). Normal left atrial pressure.  Right Ventricle: The right ventricular size is normal. No increase in right ventricular wall thickness. Global RV systolic function is has normal systolic function. The tricuspid regurgitant velocity is 2.34 m/s, and with an assumed right atrial pressure  of 3 mmHg, the  estimated right ventricular systolic pressure is normal at 24.9 mmHg.  Left Atrium: Left atrial size was normal in size.  Right Atrium: Right atrial size was normal in size  Pericardium: There is no evidence of pericardial effusion.  Mitral Valve: The mitral valve is  normal in structure. Mild mitral annular calcification. No evidence of mitral valve stenosis by observation. No evidence of mitral valve regurgitation.  Tricuspid Valve: The tricuspid valve is normal in structure. Tricuspid valve regurgitation is mild.  Aortic Valve: The aortic valve is tricuspid. Aortic valve regurgitation is not visualized. Mild aortic valve sclerosis is present, with no evidence of aortic valve stenosis.  Pulmonic Valve: The pulmonic valve was normal in structure. Pulmonic valve regurgitation is not visualized.  Aorta: The aortic root, ascending aorta and aortic arch are all structurally normal, with no evidence of dilitation or obstruction.  Venous: The inferior vena cava is normal in size with greater than 50% respiratory variability, suggesting right atrial pressure of 3 mmHg.  IAS/Shunts: No atrial level shunt detected by color flow Doppler. There is no evidence of a patent foramen ovale. No ventricular septal defect is seen or detected. There is no evidence of an atrial septal defect.    LEFT VENTRICLE PLAX 2D LVIDd:         3.10 cm  Diastology LVIDs:         2.20 cm  LV e' lateral:   9.90 cm/s LV PW:         0.90 cm  LV E/e' lateral: 9.1 LV IVS:        0.90 cm  LV e' medial:    6.20 cm/s LVOT diam:     1.50 cm  LV E/e' medial:  14.6 LV SV:         22 ml LV SV Index:   13.24 LVOT Area:     1.77 cm    RIGHT VENTRICLE RV S prime:     16.60 cm/s TAPSE (M-mode): 2.0 cm  LEFT ATRIUM             Index       RIGHT ATRIUM           Index LA diam:        3.20 cm 2.00 cm/m  RA Area:     15.60 cm LA Vol (A2C):   39.3 ml 24.56 ml/m RA Volume:   44.30 ml  27.68 ml/m LA Vol (A4C):   28.4 ml 17.75 ml/m LA Biplane Vol: 33.8 ml 21.12 ml/m  AORTIC VALVE LVOT Vmax:   132.00 cm/s LVOT Vmean:  91.300 cm/s LVOT VTI:    0.265 m   AORTA Ao Root diam: 2.80 cm  MITRAL VALVE                         TRICUSPID VALVE MV Area (PHT): 2.99 cm              TR  Peak grad:   21.9 mmHg MV PHT:        73.66 msec            TR Vmax:        234.00 cm/s MV Decel Time: 254 msec MV E velocity: 90.30 cm/s  103 cm/s  SHUNTS MV A velocity: 117.00 cm/s 70.3 cm/s Systemic VTI:  0.26 m MV E/A ratio:  0.77        1.5       Systemic Diam: 1.50 cm  LE VENOUS DUPLEX Summary: Right: There is no evidence of deep vein thrombosis in the lower extremity. No cystic structure found in the popliteal fossa. Left: There is no evidence of deep vein thrombosis in the lower extremity. No cystic structure found in the popliteal fossa  EEG DESCRIPTION:  The posterior dominant rhythm consists of 8 Hz activity of moderate voltage (25-35 uV) seen predominantly in posterior head regions, symmetric and reactive to eye opening and eye closing.  Drowsiness was characterized by attenuation of the posterior background rhythm. EEG showed intermittent 2-3Hz  left temporal delta slowing.  Hyperventilation and photic stimulation were not performed.  ABNORMALITY - Intermittent slow, left temporal  IMPRESSION: This study is suggestive of non specific cortical dysfunction in left temporal region. No seizures or epileptiform discharges were seen throughout the recording.  Antimicrobials:  Anti-infectives (From admission, onward)   None     Subjective: Seen and examined at bedside and she was seen in the chair and states that she was dizzy earlier but not now.  No chest pain, lightheadedness or dizziness.  Felt okay.  No other concerns or complaints this time  Objective: Vitals:   01/28/19 0404 01/28/19 0828 01/28/19 1230 01/28/19 1600  BP: (!) 154/70 (!) 167/73 (!) 143/81 (!) 133/59  Pulse: 91 (!) 103 83 82  Resp: 18 17 19 15   Temp: 98.4 F (36.9 C) 97.8 F (36.6 C) 98.1 F (36.7 C) 98.5 F (36.9 C)  TempSrc: Oral Oral Oral Oral  SpO2: 95% 99% 100% 100%    Intake/Output Summary (Last 24 hours) at 01/28/2019 1754 Last data filed at 01/28/2019 1117 Gross per 24 hour  Intake 980  ml  Output 1100 ml  Net -120 ml   There were no vitals filed for this visit.  Examination: Physical Exam:  Constitutional: WN/WD thin elderly Caucasian female currently in no acute distress sitting in chair bedside Eyes: Lids and conjunctivae normal, sclerae anicteric  ENMT: External Ears, Nose appear normal. Grossly normal hearing.  Neck: Appears normal, supple, no cervical masses, normal ROM, no appreciable thyromegaly; no JVD Respiratory: Diminished to auscultation bilaterally, no wheezing, rales, rhonchi or crackles. Normal respiratory effort and patient is not tachypenic. No accessory muscle use.  Cardiovascular: RRR, no murmurs / rubs / gallops. S1 and S2 auscultated. No extremity edema. Abdomen: Soft, non-tender, non-distended.  Bowel sounds positive x4.  GU: Deferred. Musculoskeletal: No clubbing / cyanosis of digits/nails. No joint deformity upper and lower extremities.  Skin: No rashes, lesions, ulcers on a limited skin evaluation. No induration; Warm and dry.  Neurologic: CN 2-12 grossly intact with no focal deficits. Romberg sign and cerebellar reflexes not assessed.  Psychiatric: Normal judgment and insight. Alert and oriented x 3. Normal mood and appropriate affect.   Data Reviewed: I have personally reviewed following labs and imaging studies  CBC: Recent Labs  Lab 01/26/19 2009 01/26/19 2046 01/27/19 1000 01/28/19 0234  WBC 7.4  --  8.0 6.2  NEUTROABS 4.3  --  5.3 2.7  HGB 13.5 12.9 13.5 12.3  HCT 41.5 38.0 42.0 37.4  MCV 93.3  --  94.0 91.9  PLT 158  --  163 [id number]   Basic Metabolic Panel: Recent Labs  Lab 01/26/19 2009 01/26/19 2046 01/27/19 1000 01/28/19 0234  NA 139 136 138 139  K 3.6 3.9 3.5 3.5  CL 100  --  102 104  CO2 24  --  24 27  GLUCOSE 125*  --  199* 106*  BUN 13  --  8  9  CREATININE 0.59  --  0.68 0.74  CALCIUM 10.0  --  9.2 9.2  MG  --   --  1.8 1.7  PHOS  --   --  2.4* 3.3   GFR: CrCl cannot be calculated (Unknown ideal  weight.). Liver Function Tests: Recent Labs  Lab 01/27/19 1000 01/28/19 0234  AST 24 20  ALT 12 8  ALKPHOS 50 44  BILITOT 0.7 0.8  PROT 6.7 5.8*  ALBUMIN 3.9 3.3*   No results for input(s): LIPASE, AMYLASE in the last 168 hours. No results for input(s): AMMONIA in the last 168 hours. Coagulation Profile: No results for input(s): INR, PROTIME in the last 168 hours. Cardiac Enzymes: No results for input(s): CKTOTAL, CKMB, CKMBINDEX, TROPONINI in the last 168 hours. BNP (last 3 results) No results for input(s): PROBNP in the last 8760 hours. HbA1C: Recent Labs    01/27/19 0537  HGBA1C 5.7*   CBG: No results for input(s): GLUCAP in the last 168 hours. Lipid Profile: Recent Labs    01/27/19 0537  CHOL 280*  HDL 86  LDLCALC 179*  TRIG 74  CHOLHDL 3.3   Thyroid Function Tests: Recent Labs    01/28/19 0234  TSH 16.178*  FREET4 1.08   Anemia Panel: No results for input(s): VITAMINB12, FOLATE, FERRITIN, TIBC, IRON, RETICCTPCT in the last 72 hours. Sepsis Labs: No results for input(s): PROCALCITON, LATICACIDVEN in the last 168 hours.  Recent Results (from the past 240 hour(s))  SARS CORONAVIRUS 2 (TAT 6-24 HRS) Nasopharyngeal Nasopharyngeal Swab     Status: None   Collection Time: 01/26/19  9:08 PM   Specimen: Nasopharyngeal Swab  Result Value Ref Range Status   SARS Coronavirus 2 NEGATIVE NEGATIVE Final    Comment: (NOTE) SARS-CoV-2 target nucleic acids are NOT DETECTED. The SARS-CoV-2 RNA is generally detectable in upper and lower respiratory specimens during the acute phase of infection. Negative results do not preclude SARS-CoV-2 infection, do not rule out co-infections with other pathogens, and should not be used as the sole basis for treatment or other patient management decisions. Negative results must be combined with clinical observations, patient history, and epidemiological information. The expected result is Negative. Fact Sheet for  Patients: SugarRoll.be Fact Sheet for Healthcare Providers: https://www.woods-mathews.com/ This test is not yet approved or cleared by the [place] FDA and  has been authorized for detection and/or diagnosis of SARS-CoV-2 by FDA under an Emergency Use Authorization (EUA). This EUA will remain  in effect (meaning this test can be used) for the duration of the COVID-19 declaration under Section 56 4(b)(1) of the Act, 21 U.S.C. section 360bbb-3(b)(1), unless the authorization is terminated or revoked sooner. Performed at [facility], [place] [address] [place],  [address]     Radiology Studies: Ct Angio Head W Or Wo Contrast  Result Date: 01/27/2019 CLINICAL DATA:  Stroke, follow-up EXAM: CT ANGIOGRAPHY HEAD AND NECK TECHNIQUE: Multidetector CT imaging of the head and neck was performed using the standard protocol before and during bolus administration of intravenous contrast. Multiplanar CT image reconstructions and MIPs were obtained to evaluate the vascular anatomy. Carotid stenosis measurements (when applicable) are obtained utilizing NASCET criteria, using the distal internal carotid diameter as the denominator. CONTRAST:  59mL OMNIPAQUE IOHEXOL 350 MG/ML SOLN COMPARISON:  Prior CT and MR imaging FINDINGS: CT HEAD FINDINGS Brain: Ill-defined hypoattenuation is present along the right hippocampus corresponding to abnormal signal on prior MRI. There is no acute intracranial hemorrhage. Small area of chronic  infarction is again identified involving the lateral right frontoparietal lobes. Additional patchy and confluent areas of hypoattenuation in the supratentorial white matter likely reflect stable chronic microvascular ischemic changes. Prominence of the ventricles and sulci reflects stable parenchymal volume loss. Vascular: No hyperdense vessel. There is intracranial atherosclerotic calcification at the skull base. Skull: Unremarkable.  Sinuses: Aerated. Orbits: No acute finding. Review of the MIP images confirms the above findings CTA NECK FINDINGS Aortic arch: Great vessel origins are patent. Right carotid system: Common carotid is patent. Primarily calcified plaque is present at the bifurcation causing minimal stenosis. Internal and external carotid arteries are patent. No hemodynamically significant stenosis or evidence of dissection. Left carotid system: Common carotid is patent. Primarily calcified plaque is present at the bifurcation causing mild, less than 50% stenosis. Internal and external carotid arteries are patent. No hemodynamically significant stenosis or evidence of dissection. Vertebral arteries: Patent. Right vertebral artery is dominant. No significant stenosis or evidence of dissection. Skeleton: Degenerative changes of the included spine. Other neck: Multinodular thyroid. Upper chest: No apical lung mass. Review of the MIP images confirms the above findings CTA HEAD FINDINGS Anterior circulation: Intracranial internal carotid arteries are patent with calcified plaque but no significant stenosis. Anterior and middle cerebral arteries are patent. Multifocal stenoses are present of a M2 MCA, A2 ACA, and more distal branches bilaterally. Several of these are severe. Posterior circulation: Intracranial right vertebral artery is patent. The intracranial left vertebral artery appears to be chronically occluded beyond the PICA origin. Basilar artery is patent. Posterior cerebral arteries are patent. There is a right posterior communicating artery present. There is mild stenosis of the basilar artery. There is irregularity with mild to moderate stenoses of the posterior cerebral arteries. There is a more distal high-grade stenosis of an occipital branch on the right. Venous sinuses: Patent as permitted by contrast timing. Review of the MIP images confirms the above findings IMPRESSION: No acute intracranial hemorrhage. Recent right  hippocampal infarction is better seen on MRI. Stable chronic findings detailed above. Significant intracranial atherosclerosis primarily involving medium and smaller branch vessels. No hemodynamically significant stenosis in the neck. Electronically Signed   By: Macy Mis M.D.   On: 01/27/2019 09:41   Ct Angio Neck W Or Wo Contrast  Result Date: 01/27/2019 CLINICAL DATA:  Stroke, follow-up EXAM: CT ANGIOGRAPHY HEAD AND NECK TECHNIQUE: Multidetector CT imaging of the head and neck was performed using the standard protocol before and during bolus administration of intravenous contrast. Multiplanar CT image reconstructions and MIPs were obtained to evaluate the vascular anatomy. Carotid stenosis measurements (when applicable) are obtained utilizing NASCET criteria, using the distal internal carotid diameter as the denominator. CONTRAST:  42mL OMNIPAQUE IOHEXOL 350 MG/ML SOLN COMPARISON:  Prior CT and MR imaging FINDINGS: CT HEAD FINDINGS Brain: Ill-defined hypoattenuation is present along the right hippocampus corresponding to abnormal signal on prior MRI. There is no acute intracranial hemorrhage. Small area of chronic infarction is again identified involving the lateral right frontoparietal lobes. Additional patchy and confluent areas of hypoattenuation in the supratentorial white matter likely reflect stable chronic microvascular ischemic changes. Prominence of the ventricles and sulci reflects stable parenchymal volume loss. Vascular: No hyperdense vessel. There is intracranial atherosclerotic calcification at the skull base. Skull: Unremarkable. Sinuses: Aerated. Orbits: No acute finding. Review of the MIP images confirms the above findings CTA NECK FINDINGS Aortic arch: Great vessel origins are patent. Right carotid system: Common carotid is patent. Primarily calcified plaque is present at the bifurcation causing minimal stenosis.  Internal and external carotid arteries are patent. No hemodynamically  significant stenosis or evidence of dissection. Left carotid system: Common carotid is patent. Primarily calcified plaque is present at the bifurcation causing mild, less than 50% stenosis. Internal and external carotid arteries are patent. No hemodynamically significant stenosis or evidence of dissection. Vertebral arteries: Patent. Right vertebral artery is dominant. No significant stenosis or evidence of dissection. Skeleton: Degenerative changes of the included spine. Other neck: Multinodular thyroid. Upper chest: No apical lung mass. Review of the MIP images confirms the above findings CTA HEAD FINDINGS Anterior circulation: Intracranial internal carotid arteries are patent with calcified plaque but no significant stenosis. Anterior and middle cerebral arteries are patent. Multifocal stenoses are present of a M2 MCA, A2 ACA, and more distal branches bilaterally. Several of these are severe. Posterior circulation: Intracranial right vertebral artery is patent. The intracranial left vertebral artery appears to be chronically occluded beyond the PICA origin. Basilar artery is patent. Posterior cerebral arteries are patent. There is a right posterior communicating artery present. There is mild stenosis of the basilar artery. There is irregularity with mild to moderate stenoses of the posterior cerebral arteries. There is a more distal high-grade stenosis of an occipital branch on the right. Venous sinuses: Patent as permitted by contrast timing. Review of the MIP images confirms the above findings IMPRESSION: No acute intracranial hemorrhage. Recent right hippocampal infarction is better seen on MRI. Stable chronic findings detailed above. Significant intracranial atherosclerosis primarily involving medium and smaller branch vessels. No hemodynamically significant stenosis in the neck. Electronically Signed   By: Macy Mis M.D.   On: 01/27/2019 09:41   Mr Brain Wo Contrast  Result Date: 01/27/2019 CLINICAL  DATA:  85 year old female with acute onset dizziness 12-24 hours ago. No mention of seizure like activity. EXAM: MRI HEAD WITHOUT CONTRAST TECHNIQUE: Multiplanar, multiecho pulse sequences of the brain and surrounding structures were obtained without intravenous contrast. COMPARISON:  Brain MRI 03/20/2016. FINDINGS: Brain: Confluent restricted diffusion throughout the head and body of the right hippocampus (series 7, image 52). Associated T2 hyperintensity and mild hippocampal swelling on series 17, image 15. No associated hemorrhage or regional mass effect. No other restricted diffusion. No midline shift, mass effect, evidence of mass lesion, ventriculomegaly, extra-axial collection or acute intracranial hemorrhage. Cervicomedullary junction and pituitary are within normal limits. There is patchy chronic encephalomalacia at the right frontal operculum, stable. Patchy and confluent superimposed bilateral cerebral white matter T2 and FLAIR hyperintensity is stable since 2018. Similar patchy T2 signal in the bilateral deep gray nuclei and pons also appears stable. Cerebellum remains negative. There are new chronic microhemorrhages in the central pons and the left deep cerebellar nuclei since 2018 (series 14, image 20, 13). No other cortical encephalomalacia or chronic cerebral blood products identified. Vascular: Major intracranial vascular flow voids are stable. Skull and upper cervical spine: Normal for age visible cervical spine. Normal bone marrow signal. Sinuses/Orbits: Stable and negative. Other: Mastoids remain clear. Visible internal auditory structures appear normal. Scalp and face soft tissues appear negative. IMPRESSION: 1. Confluent unilateral right hippocampal restricted diffusion and swelling. No associated hemorrhage or mass effect. In this clinical setting I favor this reflects acute ischemia. 2. Underlying chronic small vessel disease with mild progression since 2018. Electronically Signed   By: Genevie Ann M.D.   On: 01/27/2019 01:29   Vas [place] Lower Extremity Venous (dvt)  Result Date: 01/27/2019  Lower Venous Study Indications: Stroke.  Comparison Study: no prior Performing Technologist: June Leap RDMS,  RVT  Examination Guidelines: A complete evaluation includes B-mode imaging, spectral Doppler, color Doppler, and power Doppler as needed of all accessible portions of each vessel. Bilateral testing is considered an integral part of a complete examination. Limited examinations for reoccurring indications may be performed as noted.  +---------+---------------+---------+-----------+----------+--------------+  RIGHT     Compressibility Phasicity Spontaneity Properties Thrombus Aging  +---------+---------------+---------+-----------+----------+--------------+  CFV       Full            Yes       Yes                                    +---------+---------------+---------+-----------+----------+--------------+  SFJ       Full                                                             +---------+---------------+---------+-----------+----------+--------------+  FV Prox   Full                                                             +---------+---------------+---------+-----------+----------+--------------+  FV Mid    Full                                                             +---------+---------------+---------+-----------+----------+--------------+  FV Distal Full                                                             +---------+---------------+---------+-----------+----------+--------------+  PFV       Full                                                             +---------+---------------+---------+-----------+----------+--------------+  POP       Full            Yes       Yes                                    +---------+---------------+---------+-----------+----------+--------------+  PTV       Full                                                              +---------+---------------+---------+-----------+----------+--------------+  PERO      Full                                                             +---------+---------------+---------+-----------+----------+--------------+   +---------+---------------+---------+-----------+----------+--------------+  LEFT      Compressibility Phasicity Spontaneity Properties Thrombus Aging  +---------+---------------+---------+-----------+----------+--------------+  CFV       Full            Yes       Yes                                    +---------+---------------+---------+-----------+----------+--------------+  SFJ       Full                                                             +---------+---------------+---------+-----------+----------+--------------+  FV Prox   Full                                                             +---------+---------------+---------+-----------+----------+--------------+  FV Mid    Full                                                             +---------+---------------+---------+-----------+----------+--------------+  FV Distal Full                                                             +---------+---------------+---------+-----------+----------+--------------+  PFV       Full                                                             +---------+---------------+---------+-----------+----------+--------------+  POP       Full            Yes       Yes                                    +---------+---------------+---------+-----------+----------+--------------+  PTV       Full                                                             +---------+---------------+---------+-----------+----------+--------------+  PERO      Full                                                             +---------+---------------+---------+-----------+----------+--------------+     Summary: Right: There is no evidence of deep vein thrombosis in the lower extremity. No cystic structure found in the  popliteal fossa. Left: There is no evidence of deep vein thrombosis in the lower extremity. No cystic structure found in the popliteal fossa.  *See table(s) above for measurements and observations. Electronically signed by Servando Snare MD on 01/27/2019 at 6:11:20 PM.    Final    Scheduled Meds:  aspirin EC  81 mg Oral Daily   clopidogrel  75 mg Oral Daily   divalproex  500 mg Oral Daily   enoxaparin (LOVENOX) injection  30 mg Subcutaneous Q24H   levothyroxine  100 mcg Oral Q breakfast   multivitamin with minerals  1 tablet Oral Daily   pantoprazole  40 mg Oral Daily   Continuous Infusions:  sodium chloride 50 mL/hr at 01/28/19 [id number]    LOS: 1 day    Kerney Elbe, DO [facility] PAGER is on [place]  If 7PM-7AM, please contact night-coverage [email or web address]

## 2019-01-28 NOTE — Evaluation (Signed)
Occupational Therapy Evaluation Patient Details Name: Danielle Bryan MRN: [id number] DOB: Feb 28, 1923 Today's Date: 01/28/2019    History of Present Illness 85 y.o. 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  Patient states she woke up yesterday morning 12/2 around 8 AM and as she was trying to sit up in the bed started feeling dizzy and nauseous.  She vomited a little at that time.  Since yesterday she has continued to feel dizzy and nauseous   Clinical Impression   Pt admitted with above and presents to OT with impairments impacting ability to complete ADLs at [facility].  Pt required min assist for bed mobility and short distance ambulation and transfers with use of RW.  Pt Mod I with cane at baseline, living alone.  Pt will benefit from OT acutely to address impairments and increase independence with ADLs and functional mobility prior to d/c home.  Pt reports she could stay a short time with her son, [name], but that he does also work out of town.  If family able to provide 24/7 supervision she could go [facility] with [facility], otherwise recommend short term SNF to address balance impairments and dizziness with mobility and self-care tasks.    Follow Up Recommendations  Home health OT;SNF(HHOT if able to have 24/7 supervision)    Equipment Recommendations  None recommended by OT       Precautions / Restrictions Precautions Precautions: Fall Restrictions Weight Bearing Restrictions: No      Mobility Bed Mobility Overal bed mobility: Needs Assistance Bed Mobility: Rolling;Sidelying to Sit Rolling: Min assist Sidelying to sit: Min assist          Transfers Overall transfer level: Needs assistance Equipment used: Rolling walker (2 wheeled) Transfers: Sit to/from Stand Sit to Stand: Min assist                  ADL either performed or assessed with clinical judgement    ADL Overall ADL's : Needs assistance/impaired     Grooming: Wash/dry hands;Wash/dry face;Set up;Sitting   Upper Body Bathing: Supervision/ safety;Set up;Sitting   Lower Body Bathing: Minimal assistance;Sit to/from stand   Upper Body Dressing : Set up;Sitting       Toilet Transfer: Minimal assistance;Ambulation;RW   Toileting- Clothing Manipulation and Hygiene: Minimal assistance       Functional mobility during ADLs: Minimal assistance;Rolling walker General ADL Comments: No reports of dizziness during eval, but continued to state "not feeling well"     Vision Baseline Vision/History: Wears glasses Wears Glasses: At all times Patient Visual Report: Nausea/blurring vision with head movement Vision Assessment?: No apparent visual deficits Additional Comments: PT present for part of session, completed vestibular treatment for BPPV - may improve dizziness with head movements         Hand Dominance Right   Extremity/Trunk Assessment Upper Extremity Assessment Upper Extremity Assessment: Generalized weakness(noted tremors in B hands)           Communication Communication Communication: No difficulties   Cognition Arousal/Alertness: Awake/alert Behavior During Therapy: WFL for tasks assessed/performed Overall Cognitive Status: Within Functional Limits for tasks assessed                                                Home Living Family/patient expects to be discharged to:: Private residence Living  Arrangements: Alone Available Help at Discharge: Family;Available PRN/intermittently Type of Home: House Home Access: Stairs to enter [employer] of Steps: 2 Entrance Stairs-Rails: None Home Layout: One level     Bathroom Shower/Tub: Tub/shower unit(reports did not use shower at home)   Bathroom Toilet: Standard Bathroom Accessibility: Yes   Home Equipment: Environmental consultant - 2 wheels;Cane - single point;Wheelchair - manual   Additional  Comments: Pt does not use the shower/tub due to her husband falling years ago.  She typically sponge bathes.  Pt reports that she can stay short term with her son, but he typically works out of town      Prior Functioning/Environment Level of Independence: Independent with assistive device(s)        Comments: Does not get in tub; only sponge bathes at sink.  Does not driver.  Son drives pt to appointments and does grocery shopping        OT Problem List: Decreased strength;Decreased range of motion;Decreased activity tolerance;Impaired balance (sitting and/or standing);Impaired vision/perception      OT Treatment/Interventions: Self-care/ADL training;Therapeutic activities;Energy conservation;Patient/family education;Balance training    OT Goals(Current goals can be found in the care plan section) Acute Rehab OT Goals Patient Stated Goal: to feel better OT Goal Formulation: With patient Time For Goal Achievement: 02/11/19 Potential to Achieve Goals: Good ADL Goals Pt Will Perform Grooming: with supervision Pt Will Perform Upper Body Bathing: with modified independence Pt Will Perform Lower Body Bathing: with modified independence Pt Will Perform Upper Body Dressing: with modified independence Pt Will Perform Lower Body Dressing: with modified independence Pt Will Transfer to Toilet: with supervision Pt Will Perform Toileting - Clothing Manipulation and hygiene: with supervision  OT Frequency: Min 2X/week   Barriers to D/C: Decreased caregiver support  pt will need to short term supervision/assist       Co-evaluation PT/OT/SLP Co-Evaluation/Treatment: Yes Reason for Co-Treatment: Complexity of the patient's impairments (multi-system involvement);To address functional/ADL transfers   OT goals addressed during session: ADL's and self-care      AM-PAC OT "6 Clicks" Daily Activity     Outcome Measure Help from another person eating meals?: A Little Help from another person  taking care of personal grooming?: None Help from another person toileting, which includes using toliet, bedpan, or urinal?: A Little Help from another person bathing (including washing, rinsing, drying)?: A Little Help from another person to put on and taking off regular upper body clothing?: A Little Help from another person to put on and taking off regular lower body clothing?: A Little 6 Click Score: 19   End of Session Equipment Utilized During Treatment: Gait belt;Rolling walker Nurse Communication: Mobility status  Activity Tolerance: Patient tolerated treatment well Patient left: in chair;with chair alarm set;with call bell/phone within reach  OT Visit Diagnosis: Unsteadiness on feet (R26.81);Other symptoms and signs involving the nervous system (R29.898);Dizziness and giddiness (R42)                Time: [id number] OT Time Calculation (min): 44 min Charges:  OT General Charges $OT Visit: 1 Visit OT Evaluation $OT Eval Moderate Complexity: 1 Mod OT Treatments $Self Care/Home Management : 8-22 mins   [place], [place] 01/28/2019, 10:14 AM

## 2019-01-28 NOTE — TOC Initial Note (Signed)
Transition of Care [facility]) - Initial/Assessment Note    Patient Details  Name: Danielle Bryan MRN: [id number] Date of Birth: June 10, 1923  Transition of Care [facility]) CM/SW Contact:    Pollie Friar, RN Phone Number: 01/28/2019, 3:44 PM  Clinical Narrative:                 Recommendations are for SNF rehab. CM met with the patient and then called her son [name]). They are in agreement with SNF rehab. Fl2 completed and pt faxed out.  Pt will need a new covid within 72 hours of d/c.  TOC following.  Expected Discharge Plan: Skilled Nursing Facility Barriers to Discharge: Continued Medical Work up   Patient Goals and CMS Choice   CMS Medicare.gov Compare Post Acute Care list provided to:: Patient Represenative (must comment) Choice offered to / list presented to : Patient, Adult Children  Expected Discharge Plan and Services Expected Discharge Plan: [place] In-house Referral: Clinical Social Work Discharge Planning Services: CM Consult Post Acute Care Choice: [place] arrangements for the past 2 months: [place]                                      Prior Living Arrangements/Services Living arrangements for the past 2 months: Single Family Home Lives with:: Self Patient language and need for interpreter reviewed:: Yes Do you feel safe going back to the place where you live?: Yes      Need for Family Participation in Patient Care: Yes (Comment) Care giver support system in place?: No (comment)   Criminal Activity/Legal Involvement Pertinent to Current Situation/Hospitalization: No - Comment as needed  Activities of Daily Living      Permission Sought/Granted                  Emotional Assessment Appearance:: Appears stated age Attitude/Demeanor/Rapport: Engaged Affect (typically observed): Accepting, Pleasant Orientation: : Oriented to Self, Oriented to Place, Oriented to  Time, Oriented to Situation   Psych  Involvement: No (comment)  Admission diagnosis:  Dizziness [R42] Hoarse voice quality [R49.0] Acute cerebral ischemia [I67.82] Patient Active Problem List   Diagnosis Date Noted  . Vertigo 01/27/2019  . CVA (cerebral vascular accident) ([place]) 01/27/2019  . Aphasia 03/20/2016  . HTN (hypertension) 03/20/2016  . Weakness 03/18/2015  . Acute kidney injury ([place]) 03/18/2015  . Dehydration 03/18/2015  . Nausea and vomiting 03/18/2015  . Thrombocytopenia ([place]) 03/18/2015  . Moderate nausea and vomiting 03/18/2015  . Elevated lactic acid level 03/18/2015  . Elevated troponin 03/18/2015  . Encephalopathy, metabolic 49/67/5916  . UTI (urinary tract infection) 08/22/2014  . TIA (transient ischemic attack) 03/19/2014  . Benign hypertensive heart disease without heart failure 08/20/2010  . Hypothyroidism   . Osteoarthritis   . Hypercholesterolemia   . Reactive depression (situational)    PCP:  Velna Hatchet, MD Pharmacy:   [id number] [place] [id number]: [phone]: [phone] [place] [id number]: [phone]: [phone]    Social Determinants of Health (SDOH) Interventions    Readmission Risk Interventions No flowsheet data found.

## 2019-01-28 NOTE — NC FL2 (Addendum)
[place] LEVEL OF CARE SCREENING TOOL     IDENTIFICATION  Patient Name: Danielle Bryan Birthdate: [birth year] Sex: female Admission Date (Current Location): 01/26/2019  [facility] and [place] Number:  Herbalist and Address:  [facility], [place] [address] [place], [place] [address]      Provider Number: [id number]  Attending Physician Name and Address:  Kerney Elbe, DO  Relative Name and Phone Number:       Current Level of Care: Hospital Recommended Level of Care: [place] Prior Approval Number:    Date Approved/Denied:   PASRR Number:  [id number]  Discharge Plan: SNF    Current Diagnoses: Patient Active Problem List   Diagnosis Date Noted  . Vertigo 01/27/2019  . CVA (cerebral vascular accident) ([place]) 01/27/2019  . Aphasia 03/20/2016  . HTN (hypertension) 03/20/2016  . Weakness 03/18/2015  . Acute kidney injury ([place]) 03/18/2015  . Dehydration 03/18/2015  . Nausea and vomiting 03/18/2015  . Thrombocytopenia ([place]) 03/18/2015  . Moderate nausea and vomiting 03/18/2015  . Elevated lactic acid level 03/18/2015  . Elevated troponin 03/18/2015  . Encephalopathy, metabolic [id number]  . UTI (urinary tract infection) 08/22/2014  . TIA (transient ischemic attack) 03/19/2014  . Benign hypertensive heart disease without heart failure 08/20/2010  . Hypothyroidism   . Osteoarthritis   . Hypercholesterolemia   . Reactive depression (situational)     Orientation RESPIRATION BLADDER Height & Weight     Self, Time, Situation, Place  Normal Incontinent Weight:   Height:     BEHAVIORAL SYMPTOMS/MOOD NEUROLOGICAL BOWEL NUTRITION STATUS      Continent Diet(heart healthy)  AMBULATORY STATUS COMMUNICATION OF NEEDS Skin   Limited Assist Verbally Normal                       Personal Care Assistance Level of Assistance  Bathing, Dressing, Feeding Bathing Assistance: Limited assistance Feeding  assistance: Independent Dressing Assistance: Limited assistance     Functional Limitations Info  Sight, Hearing, Speech Sight Info: Impaired Hearing Info: Adequate Speech Info: Adequate    SPECIAL CARE FACTORS FREQUENCY  PT (By licensed PT), OT (By licensed OT)     PT Frequency: 5x/wk OT Frequency: 5x/wk            Contractures Contractures Info: Not present    Additional Factors Info  Code Status, Allergies, Psychotropic Code Status Info: Full Allergies Info: Lisinopril/ statins/ sulfa antibiotics/ Escitalopram oxalate Psychotropic Info: Depakote Er 500mg  daily         Current Medications (01/28/2019):  This is the current hospital active medication list Current Facility-Administered Medications  Medication Dose Route Frequency Provider Last Rate Last Dose  . 0.9 %  sodium chloride infusion   Intravenous Continuous Rosalin Hawking, MD 50 mL/hr at 01/28/19 0212    . acetaminophen (TYLENOL) tablet 650 mg  650 mg Oral Q4H PRN Shela Leff, MD       Or  . acetaminophen (TYLENOL) 160 MG/5ML solution 650 mg  650 mg Per Tube Q4H PRN Shela Leff, MD       Or  . acetaminophen (TYLENOL) suppository 650 mg  650 mg Rectal Q4H PRN Shela Leff, MD      . aspirin EC tablet 81 mg  81 mg Oral Daily Rosalin Hawking, MD   81 mg at 01/28/19 [id number]  . clopidogrel (PLAVIX) tablet 75 mg  75 mg Oral Daily Rosalin Hawking, MD   75 mg at 01/28/19  [id number]  . divalproex (DEPAKOTE ER) 24 hr tablet 500 mg  500 mg Oral Daily Rosalin Hawking, MD   500 mg at 01/28/19 [id number]  . enoxaparin (LOVENOX) injection 30 mg  30 mg Subcutaneous Q24H Shela Leff, MD   30 mg at 01/28/19 0952  . levothyroxine (SYNTHROID) tablet 100 mcg  100 mcg Oral Q breakfast Rosalin Hawking, MD   100 mcg at 01/28/19 0703  . meclizine (ANTIVERT) tablet 12.5 mg  12.5 mg Oral TID PRN Shela Leff, MD   12.5 mg at 01/27/19 1252  . multivitamin with minerals tablet 1 tablet  1 tablet Oral Daily Rosalin Hawking, MD   1 tablet at  01/28/19 [phone]  . ondansetron (ZOFRAN) injection 4 mg  4 mg Intravenous Q6H PRN Shela Leff, MD      . pantoprazole (PROTONIX) EC tablet 40 mg  40 mg Oral Daily Rosalin Hawking, MD   40 mg at 01/28/19 [id number]     Discharge Medications: Please see discharge summary for a list of discharge medications.  Relevant Imaging Results:  Relevant Lab Results:   Additional Information SS#: [id number]  Pollie Friar, RN

## 2019-01-28 NOTE — Progress Notes (Signed)
Physical Therapy Treatment Patient Details Name: Danielle Bryan MRN: [id number] DOB: 11-19-1923 Today's Date: 01/28/2019    History of Present Illness 85 y.o. 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  Patient states she woke up yesterday morning 12/2 around 8 AM and as she was trying to sit up in the bed started feeling dizzy and nauseous.  She vomited a little at that time.  Since yesterday she has continued to feel dizzy and nauseous    PT Comments    Pt admitted with above diagnosis. Pt was able to ambulate to sink with RW but needed min to min guard assist to walk over.  After washing hands at sink, pt lost balance backwards and needed mod assist to recover. Pt min assist to walk back to chair with RW as she was shaken up from losing balance.  S[poke with pt and son via phone that PT recommmends SNF so pt can gain strength to go home and PT can continue to address vestibular and balance issues. Both agree.  Son does not want pt to go to [place].  Will follow acutely.   Pt currently with functional limitations due to the deficits listed below (see PT Problem List). Pt will benefit from skilled PT to increase their independence and safety with mobility to allow discharge to the venue listed below.     Follow Up Recommendations  SNF;Supervision/Assistance - 24 hour     Equipment Recommendations  Rolling walker with 5" wheels(may need short RW)    Recommendations for Other Services       Precautions / Restrictions Precautions Precautions: Fall Restrictions Weight Bearing Restrictions: No    Mobility  Bed Mobility               General bed mobility comments: Pt was in chair on arrival.  Transfers Overall transfer level: Needs assistance Equipment used: Rolling walker (2 wheeled) Transfers: Sit to/from Stand Sit to Stand: Min assist         General  transfer comment: Pt needed min guard assist to stand with 1 person to RW.  Incr time to rise and cues for hand placement.   Ambulation/Gait Ambulation/Gait assistance: Min assist Gait Distance (Feet): 25 Feet Assistive device: Rolling walker (2 wheeled) Gait Pattern/deviations: Step-through pattern;Decreased stride length;Drifts right/left;Trunk flexed;Wide base of support;Leaning posteriorly   Gait velocity interpretation: <1.31 ft/sec, indicative of household ambulator General Gait Details: Pt needed cues to stay close to RW.  Pt needed assist to guide RW as well.  Pt takes small steps and needed guidance at gait belt as well.  Overall feels unsteady with gait but stated she was not necessarily dizzy while walking.  Pt with one LOB posteriorly after washing hands at sink and turning where PT corrected balance with mod assist.    Stairs             Wheelchair Mobility    Modified Rankin (Stroke Patients Only)       Balance Overall balance assessment: Needs assistance       Postural control: Posterior lean Standing balance support: Bilateral upper extremity supported;During functional activity Standing balance-Leahy Scale: Poor Standing balance comment: Pt relies on Bil UE support for gait and standing. One significant LOB after leaving sink with turn needing mod assist.  Cognition Arousal/Alertness: Awake/alert Behavior During Therapy: WFL for tasks assessed/performed Overall Cognitive Status: Within Functional Limits for tasks assessed                                        Exercises      General Comments General comments (skin integrity, edema, etc.): Did not reassess in pm as pt was not dizzy.  Lunch also came and pt wanted to eat it while hot.       Pertinent Vitals/Pain Pain Assessment: No/denies pain    Home Living                      Prior Function            PT Goals (current goals  can now be found in the care plan section) Acute Rehab PT Goals Patient Stated Goal: to feel better Progress towards PT goals: Progressing toward goals    Frequency    Min 3X/week      PT Plan Current plan remains appropriate    Co-evaluation              AM-PAC PT "6 Clicks" Mobility   Outcome Measure  Help needed turning from your back to your side while in a flat bed without using bedrails?: A Little Help needed moving from lying on your back to sitting on the side of a flat bed without using bedrails?: A Lot Help needed moving to and from a bed to a chair (including a wheelchair)?: A Little Help needed standing up from a chair using your arms (e.g., wheelchair or bedside chair)?: A Little Help needed to walk in hospital room?: A Little Help needed climbing 3-5 steps with a railing? : A Lot 6 Click Score: 16    End of Session Equipment Utilized During Treatment: Gait belt Activity Tolerance: Patient limited by fatigue Patient left: in chair;with call bell/phone within reach;with chair alarm set Nurse Communication: Mobility status PT Visit Diagnosis: Unsteadiness on feet (R26.81);Muscle weakness (generalized) (M62.81);BPPV;Dizziness and giddiness (R42) BPPV - Right/Left : Right     Time: [id number] PT Time Calculation (min) (ACUTE ONLY): 26 min  Charges:  $Gait Training: 8-22 mins $Therapeutic Activity: 8-22 mins                     Danielle Bryan W,PT Acute Rehabilitation Services Pager:  [phone]  Office:  [place] 01/28/2019, 3:11 PM

## 2019-01-28 NOTE — Evaluation (Signed)
Speech Language Pathology Evaluation Patient Details Name: IVANELLE KEESLER MRN: [id number] DOB: January 01, 1924 Today's Date: 01/28/2019 Time: [id number] SLP Time Calculation (min) (ACUTE ONLY): 11 min  Problem List:  Patient Active Problem List   Diagnosis Date Noted  . Vertigo 01/27/2019  . CVA (cerebral vascular accident) ([place]) 01/27/2019  . Aphasia 03/20/2016  . HTN (hypertension) 03/20/2016  . Weakness 03/18/2015  . Acute kidney injury ([place]) 03/18/2015  . Dehydration 03/18/2015  . Nausea and vomiting 03/18/2015  . Thrombocytopenia ([place]) 03/18/2015  . Moderate nausea and vomiting 03/18/2015  . Elevated lactic acid level 03/18/2015  . Elevated troponin 03/18/2015  . Encephalopathy, metabolic 123456  . UTI (urinary tract infection) 08/22/2014  . TIA (transient ischemic attack) 03/19/2014  . Benign hypertensive heart disease without heart failure 08/20/2010  . Hypothyroidism   . Osteoarthritis   . Hypercholesterolemia   . Reactive depression (situational)    Past Medical History:  Past Medical History:  Diagnosis Date  . Hypercholesterolemia   . Hypertension   . Hypothyroidism   . Osteoarthritis   . Reactive depression (situational)   . Stroke ([place])   . TIA (transient ischemic attack)    on plavix   Past Surgical History:  Past Surgical History:  Procedure Laterality Date  . CATARACT EXTRACTION Left 1992  . CATARACT EXTRACTION Right 1995  . DEBRIDEMENT AND CLOSURE WOUND     HPI:  85 y.o. 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  Patient states she woke up yesterday morning 12/2 around 8 AM and as she was trying to sit up in the bed started feeling dizzy and nauseous.  She vomited a little at that time.  Since yesterday she has continued to feel dizzy and nauseous. MRI revealed confluent unilateral right hippocampal restricted diffusion and swelling with  no associated hemorrhage or mass effect, with remark, in this clinical setting I favor this reflects acute ischemia and underlying chronic small vessel disease with mild progression since 2018.    Assessment / Plan / Recommendation Clinical Impression  Pt presents with overall very functional cognitive abilities. Pt is oriented x4 and is able to engage in conhesive conversation surrounding question of "who is the current president?" Pt demonstrates good attention to task, awareness of situation and safety awareness. At this time, no acute cogntiive deficits were identified. However, given continued dizziness, pt's advanced age and recent hospitalization wtih acute CVA would recommend 24 hour supervision at discharge for overall medical safety and to reduce fall risk. ST to sign off with no further services indicated.     SLP Assessment  SLP Recommendation/Assessment: Patient does not need any further Speech Lanaguage Pathology Services SLP Visit Diagnosis: Cognitive communication deficit (R41.841)    Follow Up Recommendations  None    Frequency and Duration   N/A        SLP Evaluation Cognition  Overall Cognitive Status: Within Functional Limits for tasks assessed Arousal/Alertness: Awake/alert Orientation Level: Oriented X4       Comprehension  Auditory Comprehension Overall Auditory Comprehension: Appears within functional limits for tasks assessed Visual Recognition/Discrimination Discrimination: Within Function Limits Reading Comprehension Reading Status: Within funtional limits    Expression Expression Primary Mode of Expression: Verbal Verbal Expression Overall Verbal Expression: Appears within functional limits for tasks assessed Written Expression Dominant Hand: Right Written Expression: Not tested   Oral / Motor  Oral Motor/Sensory Function Overall Oral Motor/Sensory Function: Within functional limits Motor Speech Overall Motor  Speech: Appears within functional  limits for tasks assessed   GO                    Holiday Mcmenamin 01/28/2019, 5:36 PM

## 2019-01-28 NOTE — Progress Notes (Addendum)
STROKE TEAM PROGRESS NOTE   INTERVAL HISTORY No family at bedside. Pt sitting in chair napping. Easily arousable after calling her name. She stated that her hair was messed up by the EEG gel and she did not get good sleep last night because of it. She felt her dizziness is much better now. PT worked with pt and had positive Dix-Hallpike test, consistent with BPPV and she received maneuver treatment.    Vitals:   01/27/19 2332 01/28/19 0404 01/28/19 0828 01/28/19 1230  BP: (!) 152/71 (!) 154/70 (!) 167/73 (!) 143/81  Pulse: 86 91 (!) 103 83  Resp: 18 18 17 19   Temp: 98.5 F (36.9 C) 98.4 F (36.9 C) 97.8 F (36.6 C) 98.1 F (36.7 C)  TempSrc: Oral Oral Oral Oral  SpO2: 95% 95% 99% 100%    CBC:  Recent Labs  Lab 01/27/19 1000 01/28/19 0234  WBC 8.0 6.2  NEUTROABS 5.3 2.7  HGB 13.5 12.3  HCT 42.0 37.4  MCV 94.0 91.9  PLT 163 [id number]    Basic Metabolic Panel:  Recent Labs  Lab 01/27/19 1000 01/28/19 0234  NA 138 139  K 3.5 3.5  CL 102 104  CO2 24 27  GLUCOSE 199* 106*  BUN 8 9  CREATININE 0.68 0.74  CALCIUM 9.2 9.2  MG 1.8 1.7  PHOS 2.4* 3.3   Lipid Panel:     Component Value Date/Time   CHOL 280 (H) 01/27/2019 0537   TRIG 74 01/27/2019 0537   HDL 86 01/27/2019 0537   CHOLHDL 3.3 01/27/2019 0537   VLDL 15 01/27/2019 0537   LDLCALC 179 (H) 01/27/2019 0537   HgbA1c:  Lab Results  Component Value Date   HGBA1C 5.7 (H) 01/27/2019   Urine Drug Screen: No results found for: LABOPIA, COCAINSCRNUR, LABBENZ, AMPHETMU, THCU, LABBARB  Alcohol Level No results found for: ETH  IMAGING Ct Angio Head W Or Wo Contrast  Result Date: 01/27/2019 CLINICAL DATA:  Stroke, follow-up EXAM: CT ANGIOGRAPHY HEAD AND NECK TECHNIQUE: Multidetector CT imaging of the head and neck was performed using the standard protocol before and during bolus administration of intravenous contrast. Multiplanar CT image reconstructions and MIPs were obtained to evaluate the vascular anatomy. Carotid  stenosis measurements (when applicable) are obtained utilizing NASCET criteria, using the distal internal carotid diameter as the denominator. CONTRAST:  39mL OMNIPAQUE IOHEXOL 350 MG/ML SOLN COMPARISON:  Prior CT and MR imaging FINDINGS: CT HEAD FINDINGS Brain: Ill-defined hypoattenuation is present along the right hippocampus corresponding to abnormal signal on prior MRI. There is no acute intracranial hemorrhage. Small area of chronic infarction is again identified involving the lateral right frontoparietal lobes. Additional patchy and confluent areas of hypoattenuation in the supratentorial white matter likely reflect stable chronic microvascular ischemic changes. Prominence of the ventricles and sulci reflects stable parenchymal volume loss. Vascular: No hyperdense vessel. There is intracranial atherosclerotic calcification at the skull base. Skull: Unremarkable. Sinuses: Aerated. Orbits: No acute finding. Review of the MIP images confirms the above findings CTA NECK FINDINGS Aortic arch: Great vessel origins are patent. Right carotid system: Common carotid is patent. Primarily calcified plaque is present at the bifurcation causing minimal stenosis. Internal and external carotid arteries are patent. No hemodynamically significant stenosis or evidence of dissection. Left carotid system: Common carotid is patent. Primarily calcified plaque is present at the bifurcation causing mild, less than 50% stenosis. Internal and external carotid arteries are patent. No hemodynamically significant stenosis or evidence of dissection. Vertebral arteries: Patent. Right vertebral artery is  dominant. No significant stenosis or evidence of dissection. Skeleton: Degenerative changes of the included spine. Other neck: Multinodular thyroid. Upper chest: No apical lung mass. Review of the MIP images confirms the above findings CTA HEAD FINDINGS Anterior circulation: Intracranial internal carotid arteries are patent with calcified  plaque but no significant stenosis. Anterior and middle cerebral arteries are patent. Multifocal stenoses are present of a M2 MCA, A2 ACA, and more distal branches bilaterally. Several of these are severe. Posterior circulation: Intracranial right vertebral artery is patent. The intracranial left vertebral artery appears to be chronically occluded beyond the PICA origin. Basilar artery is patent. Posterior cerebral arteries are patent. There is a right posterior communicating artery present. There is mild stenosis of the basilar artery. There is irregularity with mild to moderate stenoses of the posterior cerebral arteries. There is a more distal high-grade stenosis of an occipital branch on the right. Venous sinuses: Patent as permitted by contrast timing. Review of the MIP images confirms the above findings IMPRESSION: No acute intracranial hemorrhage. Recent right hippocampal infarction is better seen on MRI. Stable chronic findings detailed above. Significant intracranial atherosclerosis primarily involving medium and smaller branch vessels. No hemodynamically significant stenosis in the neck. Electronically Signed   By: Macy Mis M.D.   On: 01/27/2019 09:41   Ct Angio Neck W Or Wo Contrast  Result Date: 01/27/2019 CLINICAL DATA:  Stroke, follow-up EXAM: CT ANGIOGRAPHY HEAD AND NECK TECHNIQUE: Multidetector CT imaging of the head and neck was performed using the standard protocol before and during bolus administration of intravenous contrast. Multiplanar CT image reconstructions and MIPs were obtained to evaluate the vascular anatomy. Carotid stenosis measurements (when applicable) are obtained utilizing NASCET criteria, using the distal internal carotid diameter as the denominator. CONTRAST:  22mL OMNIPAQUE IOHEXOL 350 MG/ML SOLN COMPARISON:  Prior CT and MR imaging FINDINGS: CT HEAD FINDINGS Brain: Ill-defined hypoattenuation is present along the right hippocampus corresponding to abnormal signal on  prior MRI. There is no acute intracranial hemorrhage. Small area of chronic infarction is again identified involving the lateral right frontoparietal lobes. Additional patchy and confluent areas of hypoattenuation in the supratentorial white matter likely reflect stable chronic microvascular ischemic changes. Prominence of the ventricles and sulci reflects stable parenchymal volume loss. Vascular: No hyperdense vessel. There is intracranial atherosclerotic calcification at the skull base. Skull: Unremarkable. Sinuses: Aerated. Orbits: No acute finding. Review of the MIP images confirms the above findings CTA NECK FINDINGS Aortic arch: Great vessel origins are patent. Right carotid system: Common carotid is patent. Primarily calcified plaque is present at the bifurcation causing minimal stenosis. Internal and external carotid arteries are patent. No hemodynamically significant stenosis or evidence of dissection. Left carotid system: Common carotid is patent. Primarily calcified plaque is present at the bifurcation causing mild, less than 50% stenosis. Internal and external carotid arteries are patent. No hemodynamically significant stenosis or evidence of dissection. Vertebral arteries: Patent. Right vertebral artery is dominant. No significant stenosis or evidence of dissection. Skeleton: Degenerative changes of the included spine. Other neck: Multinodular thyroid. Upper chest: No apical lung mass. Review of the MIP images confirms the above findings CTA HEAD FINDINGS Anterior circulation: Intracranial internal carotid arteries are patent with calcified plaque but no significant stenosis. Anterior and middle cerebral arteries are patent. Multifocal stenoses are present of a M2 MCA, A2 ACA, and more distal branches bilaterally. Several of these are severe. Posterior circulation: Intracranial right vertebral artery is patent. The intracranial left vertebral artery appears to be chronically occluded beyond  the PICA  origin. Basilar artery is patent. Posterior cerebral arteries are patent. There is a right posterior communicating artery present. There is mild stenosis of the basilar artery. There is irregularity with mild to moderate stenoses of the posterior cerebral arteries. There is a more distal high-grade stenosis of an occipital branch on the right. Venous sinuses: Patent as permitted by contrast timing. Review of the MIP images confirms the above findings IMPRESSION: No acute intracranial hemorrhage. Recent right hippocampal infarction is better seen on MRI. Stable chronic findings detailed above. Significant intracranial atherosclerosis primarily involving medium and smaller branch vessels. No hemodynamically significant stenosis in the neck. Electronically Signed   By: Macy Mis M.D.   On: 01/27/2019 09:41   Mr Brain Wo Contrast  Result Date: 01/27/2019 CLINICAL DATA:  85 year old female with acute onset dizziness 12-24 hours ago. No mention of seizure like activity. EXAM: MRI HEAD WITHOUT CONTRAST TECHNIQUE: Multiplanar, multiecho pulse sequences of the brain and surrounding structures were obtained without intravenous contrast. COMPARISON:  Brain MRI 03/20/2016. FINDINGS: Brain: Confluent restricted diffusion throughout the head and body of the right hippocampus (series 7, image 52). Associated T2 hyperintensity and mild hippocampal swelling on series 17, image 15. No associated hemorrhage or regional mass effect. No other restricted diffusion. No midline shift, mass effect, evidence of mass lesion, ventriculomegaly, extra-axial collection or acute intracranial hemorrhage. Cervicomedullary junction and pituitary are within normal limits. There is patchy chronic encephalomalacia at the right frontal operculum, stable. Patchy and confluent superimposed bilateral cerebral white matter T2 and FLAIR hyperintensity is stable since 2018. Similar patchy T2 signal in the bilateral deep gray nuclei and pons also  appears stable. Cerebellum remains negative. There are new chronic microhemorrhages in the central pons and the left deep cerebellar nuclei since 2018 (series 14, image 20, 13). No other cortical encephalomalacia or chronic cerebral blood products identified. Vascular: Major intracranial vascular flow voids are stable. Skull and upper cervical spine: Normal for age visible cervical spine. Normal bone marrow signal. Sinuses/Orbits: Stable and negative. Other: Mastoids remain clear. Visible internal auditory structures appear normal. Scalp and face soft tissues appear negative. IMPRESSION: 1. Confluent unilateral right hippocampal restricted diffusion and swelling. No associated hemorrhage or mass effect. In this clinical setting I favor this reflects acute ischemia. 2. Underlying chronic small vessel disease with mild progression since 2018. Electronically Signed   By: Genevie Ann M.D.   On: 01/27/2019 01:29   Vas [place] Lower Extremity Venous (dvt)  Result Date: 01/27/2019  Lower Venous Study Indications: Stroke.  Comparison Study: no prior Performing Technologist: June Leap RDMS, RVT  Examination Guidelines: A complete evaluation includes B-mode imaging, spectral Doppler, color Doppler, and power Doppler as needed of all accessible portions of each vessel. Bilateral testing is considered an integral part of a complete examination. Limited examinations for reoccurring indications may be performed as noted.  +---------+---------------+---------+-----------+----------+--------------+ RIGHT    CompressibilityPhasicitySpontaneityPropertiesThrombus Aging +---------+---------------+---------+-----------+----------+--------------+ CFV      Full           Yes      Yes                                 +---------+---------------+---------+-----------+----------+--------------+ SFJ      Full                                                         +---------+---------------+---------+-----------+----------+--------------+  FV Prox  Full                                                        +---------+---------------+---------+-----------+----------+--------------+ FV Mid   Full                                                        +---------+---------------+---------+-----------+----------+--------------+ FV DistalFull                                                        +---------+---------------+---------+-----------+----------+--------------+ PFV      Full                                                        +---------+---------------+---------+-----------+----------+--------------+ POP      Full           Yes      Yes                                 +---------+---------------+---------+-----------+----------+--------------+ PTV      Full                                                        +---------+---------------+---------+-----------+----------+--------------+ PERO     Full                                                        +---------+---------------+---------+-----------+----------+--------------+   +---------+---------------+---------+-----------+----------+--------------+ LEFT     CompressibilityPhasicitySpontaneityPropertiesThrombus Aging +---------+---------------+---------+-----------+----------+--------------+ CFV      Full           Yes      Yes                                 +---------+---------------+---------+-----------+----------+--------------+ SFJ      Full                                                        +---------+---------------+---------+-----------+----------+--------------+ FV Prox  Full                                                        +---------+---------------+---------+-----------+----------+--------------+  FV Mid   Full                                                         +---------+---------------+---------+-----------+----------+--------------+ FV DistalFull                                                        +---------+---------------+---------+-----------+----------+--------------+ PFV      Full                                                        +---------+---------------+---------+-----------+----------+--------------+ POP      Full           Yes      Yes                                 +---------+---------------+---------+-----------+----------+--------------+ PTV      Full                                                        +---------+---------------+---------+-----------+----------+--------------+ PERO     Full                                                        +---------+---------------+---------+-----------+----------+--------------+     Summary: Right: There is no evidence of deep vein thrombosis in the lower extremity. No cystic structure found in the popliteal fossa. Left: There is no evidence of deep vein thrombosis in the lower extremity. No cystic structure found in the popliteal fossa.  *See table(s) above for measurements and observations. Electronically signed by Servando Snare MD on 01/27/2019 at 6:11:20 PM.    Final     PHYSICAL EXAM  Temp:  [97.8 F (36.6 C)-98.5 F (36.9 C)] 98.1 F (36.7 C) (12/04 1230) Pulse Rate:  [83-105] 83 (12/04 1230) Resp:  [16-20] 19 (12/04 1230) BP: (138-167)/(67-81) 143/81 (12/04 1230) SpO2:  [95 %-100 %] 100 % (12/04 1230)  General - Well nourished, well developed, in no apparent distress.  Ophthalmologic - fundi not visualized due to noncooperation.  Cardiovascular - Regular rhythm and rate.  Mental Status -  Level of arousal and orientation to year, place, age, self and person were intact, however not able to tell me the month. Language including expression, naming, repetition, comprehension was assessed and found intact.  Cranial Nerves II - XII - II - Visual  field intact OU. III, IV, VI - Extraocular movements intact. V - Facial sensation intact bilaterally. VII - Facial movement intact bilaterally. VIII - Hearing & vestibular intact bilaterally, no nystagmus. X - Palate elevates symmetrically. XI - Chin turning &  shoulder shrug intact bilaterally. XII - Tongue protrusion intact.  Motor Strength - The patient's strength was symmetrical in all extremities and pronator drift was absent.  Bulk was normal and fasciculations were absent.   Motor Tone - Muscle tone was assessed at the neck and appendages and was normal.  Reflexes - The patient's reflexes were symmetrical in all extremities and she had no pathological reflexes.  Sensory - Light touch, temperature/pinprick were assessed and were symmetrical.    Coordination - The patient had normal movements in the hands with no ataxia or dysmetria.  Tremor was present at rest bilaterally.  Gait and Station - deferred.    ASSESSMENT/PLAN [name] is a 85 y.o. female with history of CVA, HTN, HLD presenting with vertigo/dizziness..   BPPV, right posterior semi cannel   Dix-Hallpike maneuver showed on sitting up right upper beat nystagmus  Received canalith repositioning maneuver  PT on board  Recommend outpatient PT follow-up  Fall precaution  Stroke, incidental - R hippocampal infarct likely secondary to intracranial atherosclerosis. However, can not completely rule out embolic source.   MRI  R hippocampal infarct. Small vessel disease. Atrophy.   CTA head and neck significant intracranial medium to small sized vessel  atherosclerosis including bilateral MCA branches and bilateral PCA branches  2D Echo EF 60 to 65%  LE venous doppler no DVT  EEG no seizure  Recommend 30 day monitoring as outpatient to rule out A. fib  LDL 179  HgbA1c 5.7  Lovenox 30 mg sq daily for VTE prophylaxis  clopidogrel 75 mg daily prior to admission, now on aspirin 81 mg daily and  clopidogrel 75 mg daily. Continue DAPT x 3 weeks then back to plavix alone.   Therapy recommendations:  pending   Disposition:  pending   Hx stroke/TIA  02/2016 - L brain TIA vs. Sz - speech difficulties.  MRI negative for acute stroke but old lacunar infarcts.  MRA intracranial atherosclerosis.  Carotid Doppler negative.  EF 65 to 70%.  LDL 103.  EEG no seizure.  Concerning for TIA versus seizure.  She was put on DAPT and also Depakote for seizure prophylaxis.  02/2014 - L brain TIA - slurred speech and expressive aphasia. MRI negative.  EEG no seizure.  Considered L brain TIA, LDL 131 and A1c 5.4.  Put on aspirin.  Hypertension  Stable . Permissive hypertension (OK if < 220/120) but gradually normalize in 5-7 days . Long-term BP goal normotensive  Hyperlipidemia  Home meds: Pitavastatin  Pt intolerant to statins (myalgias)  LDL 179, goal < 70  Recommend to increase her home pitavastatin dose to 4mg  daily on discharge.  Other Stroke Risk Factors  Advanced age  Other Active Problems  Hypothyroid on [facility] day # 1  Neurology will sign off. Please call with questions. Pt will follow up with stroke clinic NP at [facility] on 03/01/2019. Thanks for the consult.   Rosalin Hawking, MD PhD Stroke Neurology 01/28/2019 1:57 PM  To contact Stroke Continuity provider, please refer to [email or web address] After hours, contact General Neurology

## 2019-01-28 NOTE — Evaluation (Signed)
Physical Therapy Evaluation Patient Details Name: Danielle Bryan MRN: [id number] DOB: 06/04/1923 Today's Date: 01/28/2019   History of Present Illness  85 y.o. 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  Patient states she woke up yesterday morning 12/2 around 8 AM and as she was trying to sit up in the bed started feeling dizzy and nauseous.  She vomited a little at that time.  Since yesterday she has continued to feel dizzy and nauseous  Clinical Impression  Pt admitted with above diagnosis. Pt was able to ambulate with min guard assist to min assist as she needs cues to stay close to RW and assist to steer RW.  She went short distance only and was slightly unsteady.  Pt does appear to have right BPPV posterior canal and did perform the canalith repositioning for this.  Pt tolerated this fairly well.  Will check back after lunch on pt to reassess pt and see if any further vestibular treatment is warranted.  Left pt sitting In chair eating breakfast.   Pt currently with functional limitations due to the deficits listed below (see PT Problem List). Pt will benefit from skilled PT to increase their independence and safety with mobility to allow discharge to the venue listed below.      Follow Up Recommendations SNF;Supervision/Assistance - 24 hour    Equipment Recommendations  Rolling walker with 5" wheels(may need short RW)    Recommendations for Other Services       Precautions / Restrictions Precautions Precautions: Fall Restrictions Weight Bearing Restrictions: No      Mobility  Bed Mobility Overal bed mobility: Needs Assistance Bed Mobility: Rolling;Sidelying to Sit Rolling: Min assist Sidelying to sit: Min assist       General bed mobility comments: Needed a little assist for LEs and for elevation of trunk.   Transfers Overall transfer level: Needs  assistance Equipment used: Rolling walker (2 wheeled) Transfers: Sit to/from Stand Sit to Stand: Min assist         General transfer comment: Pt needed min assist to stand with 2 persons but was able to stand for at least 3-4 minutes to be wiped as she was wet on arrival due to purewick leaking.   Ambulation/Gait Ambulation/Gait assistance: Min assist Gait Distance (Feet): 18 Feet Assistive device: Rolling walker (2 wheeled) Gait Pattern/deviations: Step-through pattern;Decreased stride length;Drifts right/left;Trunk flexed;Wide base of support   Gait velocity interpretation: <1.31 ft/sec, indicative of household ambulator General Gait Details: Pt needed cues to stay close to RW.  Pt needed assist to guide RW as well.  Pt takes small steps and needed guidance at gait belt as well.  Overall feels unsteady with gait but stated she was not necessarily dizzy while walking.    Stairs            Wheelchair Mobility    Modified Rankin (Stroke Patients Only)       Balance Overall balance assessment: Needs assistance Sitting-balance support: Feet supported;Bilateral upper extremity supported;No upper extremity supported Sitting balance-Leahy Scale: Poor Sitting balance - Comments: Pt appeared to need to hold on bil UE on bed for support due to dizziness when first sitting up.  Postural control: Posterior lean Standing balance support: Bilateral upper extremity supported;During functional activity Standing balance-Leahy Scale: Poor Standing balance comment: Pt relies on Bil UE support for gait and standing.  Pertinent Vitals/Pain Pain Assessment: No/denies pain    Home Living Family/patient expects to be discharged to:: Private residence Living Arrangements: Alone Available Help at Discharge: Family;Available PRN/intermittently Type of Home: House Home Access: Stairs to enter Entrance Stairs-Rails: None Entrance Stairs-Number of  Steps: 2 Home Layout: One level Home Equipment: Walker - 2 wheels;Cane - single point;Wheelchair - manual Additional Comments: Pt does not use the shower/tub due to her husband falling years ago.  She typically sponge bathes.  Pt reports that she can stay short term with her son, but he typically works out of town    Prior Function Level of Independence: Independent with assistive device(s)         Comments: Does not get in tub; only sponge bathes at sink.  Does not driver.  Son drives pt to appointments and does grocery shopping     Hand Dominance   Dominant Hand: Right    Extremity/Trunk Assessment   Upper Extremity Assessment Upper Extremity Assessment: Defer to OT evaluation    Lower Extremity Assessment Lower Extremity Assessment: Generalized weakness    Cervical / Trunk Assessment Cervical / Trunk Assessment: Kyphotic  Communication   Communication: No difficulties  Cognition Arousal/Alertness: Awake/alert Behavior During Therapy: WFL for tasks assessed/performed Overall Cognitive Status: Within Functional Limits for tasks assessed                                        General Comments General comments (skin integrity, edema, etc.): Pt initially dix hallpike and supine head roll negative however when pt sat up noted right up beating nystagmus.  Therefore laid pt back down after cleaning her and then treated pt with canalith repositioning maneuver for right posterior canal BPPV.  Unsure of success but will check back in pm and assess further. Pt was able to sit up after this treatment and was left in chair eating her breakfast.     Exercises     Assessment/Plan    PT Assessment Patient needs continued PT services  PT Problem List Decreased activity tolerance;Decreased mobility;Decreased balance;Decreased safety awareness;Decreased knowledge of precautions;Decreased knowledge of use of DME(dizziness)       PT Treatment Interventions DME  instruction;Gait training;Functional mobility training;Stair training;Therapeutic activities;Therapeutic exercise;Balance training;Patient/family education(canalith repositioning maneuver)    PT Goals (Current goals can be found in the Care Plan section)  Acute Rehab PT Goals Patient Stated Goal: to feel better PT Goal Formulation: With patient Time For Goal Achievement: 02/11/19 Potential to Achieve Goals: Good    Frequency Min 3X/week   Barriers to discharge Decreased caregiver support(son works and pt lives alone)      Co-evaluation PT/OT/SLP Co-Evaluation/Treatment: Yes Reason for Co-Treatment: Complexity of the patient's impairments (multi-system involvement) PT goals addressed during session: Mobility/safety with mobility OT goals addressed during session: ADL's and self-care       AM-PAC PT "6 Clicks" Mobility  Outcome Measure Help needed turning from your back to your side while in a flat bed without using bedrails?: A Little Help needed moving from lying on your back to sitting on the side of a flat bed without using bedrails?: A Lot Help needed moving to and from a bed to a chair (including a wheelchair)?: A Little Help needed standing up from a chair using your arms (e.g., wheelchair or bedside chair)?: A Little Help needed to walk in hospital room?: A Little Help needed climbing 3-5 steps with  a railing? : A Lot 6 Click Score: 16    End of Session Equipment Utilized During Treatment: Gait belt Activity Tolerance: Patient limited by fatigue Patient left: in chair;with call bell/phone within reach;with chair alarm set Nurse Communication: Mobility status PT Visit Diagnosis: Unsteadiness on feet (R26.81);Muscle weakness (generalized) (M62.81);BPPV;Dizziness and giddiness (R42) BPPV - Right/Left : Right    Time: [id number] PT Time Calculation (min) (ACUTE ONLY): 39 min   Charges:   PT Evaluation $PT Eval Moderate Complexity: 1 Mod PT Treatments $Canalith Rep  Proc: 8-22 mins        Ettel Albergo W,PT Acute Rehabilitation Services Pager:  [phone]  Office:  [place] 01/28/2019, 10:53 AM

## 2019-01-28 NOTE — Procedures (Addendum)
Patient Name: Danielle Bryan  MRN: [id number]  Epilepsy Attending: Lora Havens  Referring Physician/Provider: Burnetta Sabin, NP Date: 01/27/2019 Duration: 26.73mins  Patient history: 85yo F who presented with dizziness. EEG to evaluate for seizure  Level of alertness: awake, drowsy  AEDs during EEG study: VPA  Technical aspects: This EEG study was done with scalp electrodes positioned according to the 10-20 International system of electrode placement. Electrical activity was acquired at a sampling rate of 500Hz  and reviewed with a high frequency filter of 70Hz  and a low frequency filter of 1Hz . EEG data were recorded continuously and digitally stored.   DESCRIPTION:  The posterior dominant rhythm consists of 8 Hz activity of moderate voltage (25-35 uV) seen predominantly in posterior head regions, symmetric and reactive to eye opening and eye closing.  Drowsiness was characterized by attenuation of the posterior background rhythm. EEG showed intermittent 2-3Hz  left temporal delta slowing.  Hyperventilation and photic stimulation were not performed.  ABNORMALITY - Intermittent slow, left temporal  IMPRESSION: This study is suggestive of non specific cortical dysfunction in left temporal region. No seizures or epileptiform discharges were seen throughout the recording.  Danielle Bryan [name]

## 2019-01-29 DIAGNOSIS — H8111 Benign paroxysmal vertigo, right ear: Principal | ICD-10-CM

## 2019-01-29 DIAGNOSIS — R7303 Prediabetes: Secondary | ICD-10-CM

## 2019-01-29 DIAGNOSIS — R739 Hyperglycemia, unspecified: Secondary | ICD-10-CM

## 2019-01-29 MED ORDER — POTASSIUM CHLORIDE CRYS ER 20 MEQ PO TBCR
40.0000 meq | EXTENDED_RELEASE_TABLET | Freq: Once | ORAL | Status: AC
  Administered 2019-01-29: 40 meq via ORAL
  Filled 2019-01-29: qty 2

## 2019-01-29 NOTE — Progress Notes (Addendum)
Physical Therapy Treatment Patient Details Name: Danielle Bryan MRN: [id number] DOB: 1923-05-08 Today's Date: 01/29/2019    History of Present Illness 85 y.o. 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  Patient states she woke up yesterday morning 12/2 around 8 AM and as she was trying to sit up in the bed started feeling dizzy and nauseous.  She vomited a little at that time.  Since yesterday she has continued to feel dizzy and nauseous    PT Comments    Pt received sitting up in chair. Noted bilateral nystagmus with smooth pursuits. Treated for left posterior canal BPPV with canalith repositioning maneuver (right ear treated yesterday). Pt asymptomatic with dizziness but reporting mild nausea towards end of session. Will continue to assess. Performing functional mobility at a min assist level with use of walker. D/c plan remains appropriate.    Follow Up Recommendations  SNF;Supervision/Assistance - 24 hour     Equipment Recommendations  Rolling walker with 5" wheels(may need short RW)    Recommendations for Other Services       Precautions / Restrictions Precautions Precautions: Fall Restrictions Weight Bearing Restrictions: No    Mobility  Bed Mobility Overal bed mobility: Needs Assistance Bed Mobility: Rolling;Sidelying to Sit Rolling: Min assist Sidelying to sit: Min assist       General bed mobility comments: Pt requires assist for LB management and trunk elevation  Transfers Overall transfer level: Needs assistance Equipment used: Rolling walker (2 wheeled) Transfers: Sit to/from [facility] Sit to Stand: Min guard Stand pivot transfers: Min guard       General transfer comment: Pt needed min guard assist to stand with 1 person to RW.  Incr time to rise.  Ambulation/Gait                 Stairs              Wheelchair Mobility    Modified Rankin (Stroke Patients Only)       Balance Overall balance assessment: Needs assistance   Sitting balance-Leahy Scale: Fair   Postural control: Posterior lean Standing balance support: Bilateral upper extremity supported;During functional activity Standing balance-Leahy Scale: Poor Standing balance comment: Relies on BUE support in standing                            Cognition Arousal/Alertness: Awake/alert Behavior During Therapy: WFL for tasks assessed/performed Overall Cognitive Status: Within Functional Limits for tasks assessed                                        Exercises Other Exercises Other Exercises: Canalith repositioning manuever for treatment of left BPPV    General Comments        Pertinent Vitals/Pain Pain Assessment: No/denies pain    Home Living                      Prior Function            PT Goals (current goals can now be found in the care plan section) Acute Rehab PT Goals Patient Stated Goal: to feel better Potential to Achieve Goals: Good Progress towards PT goals: Progressing toward goals    Frequency    Min 3X/week      PT Plan  Current plan remains appropriate    Co-evaluation              AM-PAC PT "6 Clicks" Mobility   Outcome Measure  Help needed turning from your back to your side while in a flat bed without using bedrails?: A Little Help needed moving from lying on your back to sitting on the side of a flat bed without using bedrails?: A Lot Help needed moving to and from a bed to a chair (including a wheelchair)?: A Little Help needed standing up from a chair using your arms (e.g., wheelchair or bedside chair)?: A Little Help needed to walk in hospital room?: A Little Help needed climbing 3-5 steps with a railing? : A Lot 6 Click Score: 16    End of Session Equipment Utilized During Treatment: Gait belt Activity Tolerance: Patient  limited by fatigue Patient left: with call bell/phone within reach;in bed;with bed alarm set Nurse Communication: Mobility status PT Visit Diagnosis: Unsteadiness on feet (R26.81);Muscle weakness (generalized) (M62.81);BPPV;Dizziness and giddiness (R42) BPPV - Right/Left : Right     Time: [id number] PT Time Calculation (min) (ACUTE ONLY): 23 min  Charges:  $Therapeutic Activity: 8-22 mins $Canalith Rep Proc: 8-22 mins                     Ellamae Sia, PT, DPT Acute Rehabilitation Services Pager [phone] Office [phone]    Willy Eddy 01/29/2019, 3:37 PM

## 2019-01-29 NOTE — Progress Notes (Signed)
PROGRESS NOTE    Danielle Bryan  [id number] DOB: Dec 01, 1923 DOA: 01/26/2019 PCP: Velna Hatchet, MD   Brief Narrative:  The patient is a 85 year old Caucasian elderly female with a past medical history significant for but not limited to CVA and TIA on Plavix, hypertension, hyperlipidemia, hypothyroidism, osteoarthritis, situational depression, and other comorbidities who presented with a chief complaint of dizziness and nausea.  She woke up yesterday morning at 12 to around 8 AM and was trying to sit up in the bed started feeling dizzy and nauseous.  She states she vomited a little bit and has continued to feel dizzy and nauseous and has not been able to ambulate without support.  She denies any focal weakness or numbness and denies any headaches changes in vision but describes the dizziness as a room spinning sensation.  She stated that is worse every time she moves her head and because she was extremely symptomatic she is brought to the emergency room and she is not able to ambulate.  MRI was done was concerning for a unilateral hippocampal restriction diffusion is swelling with no associated hemorrhage or mass-effect these findings are favoring acute ischemia.  The ED provider paged and discussed the case with Dr. Lorraine Lax of neurology who reviewed the images and agrees that there is an acute ischemic change but did not feel that it is the cause of her vertigo. Neurology formally consulted and she was given aspirin 324 mg, Zofran and a 500 mL bolus of normal saline  Further work-up revealed she had some BPPV if she had a positive.  PT worked with her and they recommend skilled nursing facility.  Neurology recommended for precautionary for her stroke and recommended dual antiplatelet therapy for 3 weeks and then just back to monotherapy with clopidogrel.  We are currently looking for a skilled nursing facility for the patient given her rehab needs but offers have been given to the patient's family and  patient's family to decide by tomorrow and likely get insurance observation by Monday and discharge then per Education officer, museum.  Assessment & Plan:   Principal Problem:   CVA (cerebral vascular accident) ([place]) Active Problems:   HTN (hypertension)   Vertigo  Acute Right Hippocampal CVA Vertigo from BPPV -Patient is presenting with complaints of dizziness and nausea but neurology does not feel like the CVA is related to her dizziness -Symptom onset 12/2 around 8 AM.  -She has a history of prior CVA/TIA on Plavix.  -MRI concerning for unilateral right hippocampal restriction diffusion and swelling. No associated hemorrhage or mass-effect. Findings favor acute ischemia.  -ED provider discussed the case with Dr. Merrily Pew neurology who reviewed the patient's imaging and agrees that this is an acute ischemic change but does not feel it is the cause of her vertigo. BPPV is also on the differential for vertigo. -C/w Telemetry monitoring to evaluate for possible atrial fibrillation -Allow for permissive hypertension-treat only if systolic blood pressures are greater than 220. -CT Head and Neck done and showed "No acute intracranial hemorrhage. Recent right hippocampal infarction is better seen on MRI. Stable chronic findings detailed above. Significant intracranial atherosclerosis primarily involving medium and smaller branch vessels. No hemodynamically significant stenosis in the neck." -TransThoracic 2D Echocardiogram ordered and to be done; EEG also being done by Neurology along with LE VTE -Neurology recommending Continuing ASA 81 mgand Plavix 75 mg daily -Hemoglobin A1c was 5.7, Fasting lipid panel -Lipid Panel as below -Received Aspirin 324 mg daily: Neurology also recommending starting high intensity statin  but ? Intolerance so will need to discuss and Start Statin in AM  -Frequent neurochecks -PT, OT, speech therapy. PT to do Vestibular Testing; Had a Positive Dix-Hallpike Maneuver and  will need SNF for Rehab  -C/w Fall Precautions  -N.p.o. until cleared by bedside swallow evaluation or formal speech evaluation and had no needs  -Neurology will see the patient and recommending DAPT with ASA and Plavix for 3 weeks and then Plavix daily  -EEG showed no Seizure  -Continue Meclizine 12.5 mg po Dailyprn  -Antiemetic with Zofran 4 mg IV q6hprn Nauesa/Vomiting as needed -Patient passed bedside swallow screen so we will start on a heart healthy diet -May need a Loop Recorder per Neuro but after review of her Imaging they feel she has very stenosis'd intracranial vessels and for now they are recommending a 30-day monitor rule out atrial fibrillation in outpatient setting -Dizziness is improving -Had a lower extremity duplex done and showed no evidence of DVT -ECHOCARDIOGRAM showed "Left ventricular ejection fraction, by visual estimation, is 60 to 65%. The left ventricle has normal function. There is no left ventricular hypertrophy.  2. Left ventricular diastolic parameters are consistent with Grade I diastolic dysfunction (impaired relaxation)." -Resume PitavaStatin at D/C and increase dose to 4 mg weekly per Neurology  -Will not repeat labs as her labs were essentially normal  Hypertension -Allow Permissive Hypertension at this time for systolic less than [id number] and gradually normalizing 5 to 7 days with long-term blood pressure goal normotensive -BP this AM was 148/74  Hx of Seizure Disorder -Continue Divalproex 500 mg p.o. daily and Neurology checking an EEG -EEG as below   Hypothyroidism -Check TSH and was 16.178 but free T4 was 1.08 -Continue with levothyroxine 100 mcg  Hyperlipidemia -Lipid Panel done and showed a total cholesterol/HDL ratio of 3.3, cholesterol level was 280, HDL was 86, LDL was 179, triglycerides were 74, and VLDL 15 -Takes Pitavastatin at home weekly and Neurology recommending to increase dose to 4 mg Weekly at D/C   Hyperglycemia -In the  setting of Pre-Diabetes -Blood Sugars have been ranging from 106-199 -HbA1c was 5.7 -If necessary will place on Sensitive Novolog SSI  DVT prophylaxis: Enoxaparin 30 mg sq q24h Code Status: FULL CODE Family Communication: No family present at bedside during examination Disposition Plan: SNF in the next 24-48 hours  Consultants:  Neurology    Procedures:  ECHOCARDIOGRAM IMPRESSIONS    1. Left ventricular ejection fraction, by visual estimation, is 60 to 65%. The left ventricle has normal function. There is no left ventricular hypertrophy.  2. Left ventricular diastolic parameters are consistent with Grade I diastolic dysfunction (impaired relaxation).  3. Global right ventricle has normal systolic function.The right ventricular size is normal. No increase in right ventricular wall thickness.  4. Left atrial size was normal.  5. Right atrial size was normal.  6. Mild mitral annular calcification.  7. The mitral valve is normal in structure. No evidence of mitral valve regurgitation. No evidence of mitral stenosis.  8. The tricuspid valve is normal in structure. Tricuspid valve regurgitation is mild.  9. The aortic valve is tricuspid. Aortic valve regurgitation is not visualized. Mild aortic valve sclerosis without stenosis. 10. The pulmonic valve was normal in structure. Pulmonic valve regurgitation is not visualized. 11. Normal pulmonary artery systolic pressure. 12. The inferior vena cava is normal in size with greater than 50% respiratory variability, suggesting right atrial pressure of 3 mmHg.  FINDINGS  Left Ventricle: Left ventricular ejection fraction, by visual  estimation, is 60 to 65%. The left ventricle has normal function. There is no left ventricular hypertrophy. Left ventricular diastolic parameters are consistent with Grade I diastolic  dysfunction (impaired relaxation). Normal left atrial pressure.  Right Ventricle: The right ventricular size is normal. No  increase in right ventricular wall thickness. Global RV systolic function is has normal systolic function. The tricuspid regurgitant velocity is 2.34 m/s, and with an assumed right atrial pressure  of 3 mmHg, the estimated right ventricular systolic pressure is normal at 24.9 mmHg.  Left Atrium: Left atrial size was normal in size.  Right Atrium: Right atrial size was normal in size  Pericardium: There is no evidence of pericardial effusion.  Mitral Valve: The mitral valve is normal in structure. Mild mitral annular calcification. No evidence of mitral valve stenosis by observation. No evidence of mitral valve regurgitation.  Tricuspid Valve: The tricuspid valve is normal in structure. Tricuspid valve regurgitation is mild.  Aortic Valve: The aortic valve is tricuspid. Aortic valve regurgitation is not visualized. Mild aortic valve sclerosis is present, with no evidence of aortic valve stenosis.  Pulmonic Valve: The pulmonic valve was normal in structure. Pulmonic valve regurgitation is not visualized.  Aorta: The aortic root, ascending aorta and aortic arch are all structurally normal, with no evidence of dilitation or obstruction.  Venous: The inferior vena cava is normal in size with greater than 50% respiratory variability, suggesting right atrial pressure of 3 mmHg.  IAS/Shunts: No atrial level shunt detected by color flow Doppler. There is no evidence of a patent foramen ovale. No ventricular septal defect is seen or detected. There is no evidence of an atrial septal defect.    LEFT VENTRICLE PLAX 2D LVIDd:         3.10 cm  Diastology LVIDs:         2.20 cm  LV e' lateral:   9.90 cm/s LV PW:         0.90 cm  LV E/e' lateral: 9.1 LV IVS:        0.90 cm  LV e' medial:    6.20 cm/s LVOT diam:     1.50 cm  LV E/e' medial:  14.6 LV SV:         22 ml LV SV Index:   13.24 LVOT Area:     1.77 cm    RIGHT VENTRICLE RV S prime:     16.60 cm/s TAPSE (M-mode): 2.0  cm  LEFT ATRIUM             Index       RIGHT ATRIUM           Index LA diam:        3.20 cm 2.00 cm/m  RA Area:     15.60 cm LA Vol (A2C):   39.3 ml 24.56 ml/m RA Volume:   44.30 ml  27.68 ml/m LA Vol (A4C):   28.4 ml 17.75 ml/m LA Biplane Vol: 33.8 ml 21.12 ml/m  AORTIC VALVE LVOT Vmax:   132.00 cm/s LVOT Vmean:  91.300 cm/s LVOT VTI:    0.265 m   AORTA Ao Root diam: 2.80 cm  MITRAL VALVE                         TRICUSPID VALVE MV Area (PHT): 2.99 cm              TR Peak grad:   21.9 mmHg MV PHT:  73.66 msec            TR Vmax:        234.00 cm/s MV Decel Time: 254 msec MV E velocity: 90.30 cm/s  103 cm/s  SHUNTS MV A velocity: 117.00 cm/s 70.3 cm/s Systemic VTI:  0.26 m MV E/A ratio:  0.77        1.5       Systemic Diam: 1.50 cm  LE VENOUS DUPLEX Summary: Right: There is no evidence of deep vein thrombosis in the lower extremity. No cystic structure found in the popliteal fossa. Left: There is no evidence of deep vein thrombosis in the lower extremity. No cystic structure found in the popliteal fossa  EEG DESCRIPTION:  The posterior dominant rhythm consists of 8 Hz activity of moderate voltage (25-35 uV) seen predominantly in posterior head regions, symmetric and reactive to eye opening and eye closing.  Drowsiness was characterized by attenuation of the posterior background rhythm. EEG showed intermittent 2-3Hz  left temporal delta slowing.  Hyperventilation and photic stimulation were not performed.  ABNORMALITY - Intermittent slow, left temporal  IMPRESSION: This study is suggestive of non specific cortical dysfunction in left temporal region. No seizures or epileptiform discharges were seen throughout the recording.  Antimicrobials:  Anti-infectives (From admission, onward)   None     Subjective: Seen and examined at bedside and she had no complaints.  No nausea or vomiting.  States her dizziness is improved.  States that she slept well.  No other  concerns or points of time and social working contact with the patient's family and have given them but offers and family to decide by tomorrow for placement.  Objective: Vitals:   01/29/19 0026 01/29/19 0408 01/29/19 0717 01/29/19 1233  BP: (!) 150/73 (!) 143/73 (!) 144/93 (!) 148/74  Pulse: 88 90 87 89  Resp: 16 18 16 18   Temp: 98.2 F (36.8 C) 98.5 F (36.9 C) 97.8 F (36.6 C) 97.6 F (36.4 C)  TempSrc: Oral Oral Oral Oral  SpO2: 97% 100% 98% 99%    Intake/Output Summary (Last 24 hours) at 01/29/2019 1522 Last data filed at 01/29/2019 0949 Gross per 24 hour  Intake 440 ml  Output 2300 ml  Net -1860 ml   There were no vitals filed for this visit.  Examination: Physical Exam:  Constitutional: WN/WD thin elderly Caucasian female in NAD and appears calm  Eyes: Lids and conjunctivae normal, sclerae anicteric  ENMT: External Ears, Nose appear normal. Grossly normal hearing. Mucous membranes are moist. Neck: Appears normal, supple, no cervical masses, normal ROM, no appreciable thyromegaly; no JVD Respiratory: Diminished to auscultation bilaterally, no wheezing, rales, rhonchi or crackles. Normal respiratory effort and patient is not tachypenic. No accessory muscle use.  Cardiovascular: RRR, no murmurs / rubs / gallops. S1 and S2 auscultated. No extremity edema. Abdomen: Soft, non-tender, non-distended. Bowel sounds positive x4.  GU: Deferred. Musculoskeletal: No clubbing / cyanosis of digits/nails. No joint deformity upper and lower extremities.  Skin: No rashes, lesions, ulcers on a limited skin evaluation. No induration; Warm and dry.  Neurologic: CN 2-12 grossly intact with no focal deficits. Romberg sign and cerebellar reflexes not assessed.  Psychiatric: Normal judgment and insight. Alert and oriented x 3. Normal mood and appropriate affect.   Data Reviewed: I have personally reviewed following labs and imaging studies  CBC: Recent Labs  Lab 01/26/19 2009 01/26/19 2046  01/27/19 1000 01/28/19 0234  WBC 7.4  --  8.0 6.2  NEUTROABS 4.3  --  5.3 2.7  HGB 13.5 12.9 13.5 12.3  HCT 41.5 38.0 42.0 37.4  MCV 93.3  --  94.0 91.9  PLT 158  --  163 [id number]   Basic Metabolic Panel: Recent Labs  Lab 01/26/19 2009 01/26/19 2046 01/27/19 1000 01/28/19 0234  NA 139 136 138 139  K 3.6 3.9 3.5 3.5  CL 100  --  102 104  CO2 24  --  24 27  GLUCOSE 125*  --  199* 106*  BUN 13  --  8 9  CREATININE 0.59  --  0.68 0.74  CALCIUM 10.0  --  9.2 9.2  MG  --   --  1.8 1.7  PHOS  --   --  2.4* 3.3   GFR: CrCl cannot be calculated (Unknown ideal weight.). Liver Function Tests: Recent Labs  Lab 01/27/19 1000 01/28/19 0234  AST 24 20  ALT 12 8  ALKPHOS 50 44  BILITOT 0.7 0.8  PROT 6.7 5.8*  ALBUMIN 3.9 3.3*   No results for input(s): LIPASE, AMYLASE in the last 168 hours. No results for input(s): AMMONIA in the last 168 hours. Coagulation Profile: No results for input(s): INR, PROTIME in the last 168 hours. Cardiac Enzymes: No results for input(s): CKTOTAL, CKMB, CKMBINDEX, TROPONINI in the last 168 hours. BNP (last 3 results) No results for input(s): PROBNP in the last 8760 hours. HbA1C: Recent Labs    01/27/19 0537  HGBA1C 5.7*   CBG: No results for input(s): GLUCAP in the last 168 hours. Lipid Profile: Recent Labs    01/27/19 0537  CHOL 280*  HDL 86  LDLCALC 179*  TRIG 74  CHOLHDL 3.3   Thyroid Function Tests: Recent Labs    01/28/19 0234  TSH 16.178*  FREET4 1.08   Anemia Panel: No results for input(s): VITAMINB12, FOLATE, FERRITIN, TIBC, IRON, RETICCTPCT in the last 72 hours. Sepsis Labs: No results for input(s): PROCALCITON, LATICACIDVEN in the last 168 hours.  Recent Results (from the past 240 hour(s))  SARS CORONAVIRUS 2 (TAT 6-24 HRS) Nasopharyngeal Nasopharyngeal Swab     Status: None   Collection Time: 01/26/19  9:08 PM   Specimen: Nasopharyngeal Swab  Result Value Ref Range Status   SARS Coronavirus 2 NEGATIVE NEGATIVE  Final    Comment: (NOTE) SARS-CoV-2 target nucleic acids are NOT DETECTED. The SARS-CoV-2 RNA is generally detectable in upper and lower respiratory specimens during the acute phase of infection. Negative results do not preclude SARS-CoV-2 infection, do not rule out co-infections with other pathogens, and should not be used as the sole basis for treatment or other patient management decisions. Negative results must be combined with clinical observations, patient history, and epidemiological information. The expected result is Negative. Fact Sheet for Patients: SugarRoll.be Fact Sheet for Healthcare Providers: https://www.woods-mathews.com/ This test is not yet approved or cleared by the [place] FDA and  has been authorized for detection and/or diagnosis of SARS-CoV-2 by FDA under an Emergency Use Authorization (EUA). This EUA will remain  in effect (meaning this test can be used) for the duration of the COVID-19 declaration under Section 56 4(b)(1) of the Act, 21 U.S.C. section 360bbb-3(b)(1), unless the authorization is terminated or revoked sooner. Performed at [facility], [place] [address], [place], [place] [address]   SARS CORONAVIRUS 2 (TAT 6-24 HRS) Nasopharyngeal Nasopharyngeal Swab     Status: None   Collection Time: 01/28/19  6:04 PM   Specimen: Nasopharyngeal Swab  Result Value Ref Range Status   SARS  Coronavirus 2 NEGATIVE NEGATIVE Final    Comment: (NOTE) SARS-CoV-2 target nucleic acids are NOT DETECTED. The SARS-CoV-2 RNA is generally detectable in upper and lower respiratory specimens during the acute phase of infection. Negative results do not preclude SARS-CoV-2 infection, do not rule out co-infections with other pathogens, and should not be used as the sole basis for treatment or other patient management decisions. Negative results must be combined with clinical observations, patient history, and  epidemiological information. The expected result is Negative. Fact Sheet for Patients: SugarRoll.be Fact Sheet for Healthcare Providers: https://www.woods-mathews.com/ This test is not yet approved or cleared by the [place] FDA and  has been authorized for detection and/or diagnosis of SARS-CoV-2 by FDA under an Emergency Use Authorization (EUA). This EUA will remain  in effect (meaning this test can be used) for the duration of the COVID-19 declaration under Section 56 4(b)(1) of the Act, 21 U.S.C. section 360bbb-3(b)(1), unless the authorization is terminated or revoked sooner. Performed at [facility], [place] [address], [place], [place] [address]     Radiology Studies: Vas [place] Lower Extremity Venous (dvt)  Result Date: 01/27/2019  Lower Venous Study Indications: Stroke.  Comparison Study: no prior Performing Technologist: June Leap RDMS, RVT  Examination Guidelines: A complete evaluation includes B-mode imaging, spectral Doppler, color Doppler, and power Doppler as needed of all accessible portions of each vessel. Bilateral testing is considered an integral part of a complete examination. Limited examinations for reoccurring indications may be performed as noted.  +---------+---------------+---------+-----------+----------+--------------+  RIGHT     Compressibility Phasicity Spontaneity Properties Thrombus Aging  +---------+---------------+---------+-----------+----------+--------------+  CFV       Full            Yes       Yes                                    +---------+---------------+---------+-----------+----------+--------------+  SFJ       Full                                                             +---------+---------------+---------+-----------+----------+--------------+  FV Prox   Full                                                             +---------+---------------+---------+-----------+----------+--------------+  FV  Mid    Full                                                             +---------+---------------+---------+-----------+----------+--------------+  FV Distal Full                                                             +---------+---------------+---------+-----------+----------+--------------+  PFV       Full                                                             +---------+---------------+---------+-----------+----------+--------------+  POP       Full            Yes       Yes                                    +---------+---------------+---------+-----------+----------+--------------+  PTV       Full                                                             +---------+---------------+---------+-----------+----------+--------------+  PERO      Full                                                             +---------+---------------+---------+-----------+----------+--------------+   +---------+---------------+---------+-----------+----------+--------------+  LEFT      Compressibility Phasicity Spontaneity Properties Thrombus Aging  +---------+---------------+---------+-----------+----------+--------------+  CFV       Full            Yes       Yes                                    +---------+---------------+---------+-----------+----------+--------------+  SFJ       Full                                                             +---------+---------------+---------+-----------+----------+--------------+  FV Prox   Full                                                             +---------+---------------+---------+-----------+----------+--------------+  FV Mid    Full                                                             +---------+---------------+---------+-----------+----------+--------------+  FV Distal Full                                                             +---------+---------------+---------+-----------+----------+--------------+  PFV       Full                                                              +---------+---------------+---------+-----------+----------+--------------+  POP       Full            Yes       Yes                                    +---------+---------------+---------+-----------+----------+--------------+  PTV       Full                                                             +---------+---------------+---------+-----------+----------+--------------+  PERO      Full                                                             +---------+---------------+---------+-----------+----------+--------------+     Summary: Right: There is no evidence of deep vein thrombosis in the lower extremity. No cystic structure found in the popliteal fossa. Left: There is no evidence of deep vein thrombosis in the lower extremity. No cystic structure found in the popliteal fossa.  *See table(s) above for measurements and observations. Electronically signed by Servando Snare MD on 01/27/2019 at 6:11:20 PM.    Final    Scheduled Meds:  aspirin EC  81 mg Oral Daily   clopidogrel  75 mg Oral Daily   divalproex  500 mg Oral Daily   enoxaparin (LOVENOX) injection  30 mg Subcutaneous Q24H   levothyroxine  100 mcg Oral Q breakfast   multivitamin with minerals  1 tablet Oral Daily   pantoprazole  40 mg Oral Daily   Continuous Infusions:   LOS: 2 days    Kerney Elbe, DO [facility] PAGER is on [place]  If 7PM-7AM, please contact night-coverage [email or web address]

## 2019-01-29 NOTE — TOC Progression Note (Signed)
Transition of Care [facility]) - Progression Note    Patient Details  Name: JAILAH CUPIT MRN: [id number] Date of Birth: 19-Jun-1923  Transition of Care [facility]) CM/SW [place], [place] Phone Number: 01/29/2019, 11:15 AM  Clinical Narrative:     CSW called and spoke with patient's son, Twinkle Oppermann. CSW introduced herself and shared SNF bed offers. He requested CSW email these to him so he can do some research and pick. CSW asked him if he could chose no later than Sunday afternoon. When SNF is chosen, the facility will have to start insurance authorization.   CSW will continue to follow and assist with disposition planning.   Expected Discharge Plan: [place] Barriers to Discharge: Continued Medical Work up  Expected Discharge Plan and Services Expected Discharge Plan: Grandfather In-house Referral: Clinical Social Work Discharge Planning Services: CM Consult Post Acute Care Choice: [place] arrangements for the past 2 months: Single Family Home                                       Social Determinants of Health (SDOH) Interventions    Readmission Risk Interventions No flowsheet data found.

## 2019-01-29 NOTE — Progress Notes (Signed)
Occupational Therapy Treatment Patient Details Name: Danielle Bryan MRN: [id number] DOB: [birth year] Today's Date: 01/29/2019    History of present illness 85 y.o. 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  Patient states she woke up yesterday morning 12/2 around 8 AM and as she was trying to sit up in the bed started feeling dizzy and nauseous.  She vomited a little at that time.  Since yesterday she has continued to feel dizzy and nauseous   OT comments  Pt progressing well today. Pt reporting dizziness sitting EOB after bed mobility, but resolves within a few seconds. Pt with nystagmus in both eyes when sitting. Pt very tearful throughout session, but would not explain why. Pt minA overall for ADL; pt able to stand at sink for grooming and standing for toilet hygiene. Pt standing on propped elbow to conserve energy. Pt minA for bed mobility, transfers and ADL functional mobility. Pt requires increased time for all tasks. Pt would benefit from continued OT skilled services for ADL, mobility and safety in SNF setting. OT following acutely.    Follow Up Recommendations  SNF    Equipment Recommendations  Other (comment)(to be determined by next venue)    Recommendations for Other Services      Precautions / Restrictions Precautions Precautions: Fall Restrictions Weight Bearing Restrictions: No       Mobility Bed Mobility Overal bed mobility: Needs Assistance Bed Mobility: Rolling;Sidelying to Sit Rolling: Min assist Sidelying to sit: Min assist       General bed mobility comments: Pt requires assist for LB management and trunk elevation  Transfers Overall transfer level: Needs assistance Equipment used: Rolling walker (2 wheeled) Transfers: Sit to/from Stand Sit to Stand: Min assist         General transfer comment: Increased time and minA for power up     Balance Overall balance assessment: Needs assistance       Postural control: Posterior lean Standing balance support: Bilateral upper extremity supported;During functional activity Standing balance-Leahy Scale: Poor Standing balance comment: Pt resting on counter for stabilty and for energy conservation                           ADL either performed or assessed with clinical judgement   ADL Overall ADL's : Needs assistance/impaired     Grooming: Min guard;Standing;Wash/dry hands;Wash/dry face;Oral care               Lower Body Dressing: Minimal assistance;Sitting/lateral leans;Sit to/from stand   Toilet Transfer: Min guard;Ambulation;RW;Grab bars   Toileting- Clothing Manipulation and Hygiene: Minimal assistance;Sitting/lateral lean;Sit to/from stand       Functional mobility during ADLs: Minimal assistance;Rolling walker;Cueing for safety General ADL Comments: Pt reporting dizziness sitting EOB after bed mobility. Pt very tearful throughout session, but would not explain why. Pt minA overall for ADL; pt able to stand at sink for grooming and standing for toilet hygiene.     Vision   Vision Assessment?: No apparent visual deficits   Perception     Praxis      Cognition Arousal/Alertness: Awake/alert Behavior During Therapy: WFL for tasks assessed/performed Overall Cognitive Status: Within Functional Limits for tasks assessed  Exercises     Shoulder Instructions       General Comments      Pertinent Vitals/ Pain       Pain Assessment: No/denies pain  Home Living                                          Prior Functioning/Environment              Frequency  Min 2X/week        Progress Toward Goals  OT Goals(current goals can now be found in the care plan section)  Progress towards OT goals: Progressing toward goals  Acute Rehab OT Goals Patient Stated  Goal: to feel better OT Goal Formulation: With patient Time For Goal Achievement: 02/11/19 Potential to Achieve Goals: Good ADL Goals Pt Will Perform Grooming: with supervision Pt Will Perform Upper Body Bathing: with modified independence Pt Will Perform Lower Body Bathing: with modified independence Pt Will Perform Upper Body Dressing: with modified independence Pt Will Perform Lower Body Dressing: with modified independence Pt Will Transfer to Toilet: with supervision Pt Will Perform Toileting - Clothing Manipulation and hygiene: with supervision  Plan Discharge plan remains appropriate    Co-evaluation                 AM-PAC OT "6 Clicks" Daily Activity     Outcome Measure   Help from another person eating meals?: A Little Help from another person taking care of personal grooming?: None Help from another person toileting, which includes using toliet, bedpan, or urinal?: A Little Help from another person bathing (including washing, rinsing, drying)?: A Little Help from another person to put on and taking off regular upper body clothing?: A Little Help from another person to put on and taking off regular lower body clothing?: A Little 6 Click Score: 19    End of Session Equipment Utilized During Treatment: Gait belt;Rolling walker  OT Visit Diagnosis: Unsteadiness on feet (R26.81);Muscle weakness (generalized) (M62.81)   Activity Tolerance Patient tolerated treatment well   Patient Left in chair;with chair alarm set;with call bell/phone within reach   Nurse Communication Mobility status        Time: 1110-1155 OT Time Calculation (min): 45 min  Charges: OT General Charges $OT Visit: 1 Visit OT Treatments $Self Care/Home Management : 23-37 mins $Therapeutic Activity: 8-22 mins  Darryl Nestle) Marsa Aris OTR/L Acute Rehabilitation Services Pager: [phone] Office: [phone]    Jenene Slicker Randie Tallarico 01/29/2019, 2:17 PM

## 2019-01-29 NOTE — Plan of Care (Signed)
  Problem: Self-Care: Goal: Ability to communicate needs accurately will improve Outcome: Progressing   Problem: Self-Care: Goal: Ability to communicate needs accurately will improve Outcome: Progressing   Problem: Self-Care: Goal: Verbalization of feelings and concerns over difficulty with self-care will improve Outcome: Progressing   Problem: Self-Care: Goal: Ability to participate in self-care as condition permits will improve Outcome: Progressing

## 2019-01-30 ENCOUNTER — Other Ambulatory Visit: Payer: Self-pay

## 2019-01-30 MED ORDER — LIVALO 2 MG PO TABS: 4.0000 mg | ORAL_TABLET | ORAL | 3 refills | Status: DC

## 2019-01-30 MED ORDER — MECLIZINE HCL 12.5 MG PO TABS: 12.5000 mg | ORAL_TABLET | Freq: Three times a day (TID) | ORAL | 0 refills | Status: DC | PRN

## 2019-01-30 MED ORDER — ASPIRIN 81 MG PO TBEC: 81.0000 mg | DELAYED_RELEASE_TABLET | Freq: Every day | ORAL | 0 refills | Status: AC

## 2019-01-30 NOTE — Plan of Care (Signed)
  Problem: Coping: Goal: Will verbalize positive feelings about self Outcome: Progressing   Problem: Education: Goal: Individualized Educational Video(s) Outcome: Progressing   Problem: Education: Goal: Knowledge of patient specific risk factors addressed and post discharge goals established will improve Outcome: Progressing   Problem: Education: Goal: Knowledge of secondary prevention will improve Outcome: Progressing   Problem: Education: Goal: Knowledge of disease or condition will improve Outcome: Progressing   

## 2019-01-30 NOTE — Discharge Summary (Signed)
Physician Discharge Summary  MATILYN OLINSKI [id number] DOB: 10-Jun-1923 DOA: 01/26/2019  PCP: Velna Hatchet, MD  Admit date: 01/26/2019 Discharge date: 01/30/2019  Admitted From: Home Disposition: SNF  Recommendations for Outpatient Follow-up:  1. Follow up with PCP in 1-2 weeks 2. Follow up with Stroke Clinic NP at [facility] on 03/01/2019 3. Will need 30-day monitoring in outpatient setting to rule out atrial fibrillation 4. Continue dual antiplatelet therapy and convert back to monotherapy with Clopidogrel 75 mg p.o. daily 5. Please obtain CMP/CBC, Mag, Phos in one week 6. Please follow up on the following pending results:  Home Health: No  Equipment/Devices: None   Discharge Condition: Stable  CODE STATUS: FULL CODE  Diet recommendation: Heart Healthy Diet   Brief/Interim Summary: The patient is a 85 year old Caucasian elderly female with a past medical history significant for but not limited to CVA and TIA on Plavix, hypertension, hyperlipidemia, hypothyroidism, osteoarthritis, situational depression, and other comorbidities who presented with a chief complaint of dizziness and nausea. She woke up yesterday morning at 12 to around 8 AM and was trying to sit up in the bed started feeling dizzy and nauseous. She states she vomited a little bit and has continued to feel dizzy and nauseous and has not been able to ambulate without support. She denies any focal weakness or numbness and denies any headaches changes in vision but describes the dizziness as a room spinning sensation. She stated that is worse every time she moves her head and because she was extremely symptomatic she is brought to the emergency room and she is not able to ambulate. MRI was done was concerning for a unilateral hippocampal restriction diffusion is swelling with no associated hemorrhage or mass-effect these findings are favoring acute ischemia. The ED provider paged and discussed the case with Dr. Lorraine Lax of neurology  who reviewed the images and agrees that there is an acute ischemic change but did not feel that it is the cause of her vertigo. Neurology formally consulted and she was given aspirin 324 mg,Zofran and a 500 mL bolus of normal saline  Further work-up revealed she had some BPPV as Dix-Hallpike Maneuver was positive as well as in incidental R Hippocampal CVA likely 2/2 to Intracranial Atherosclerosis.  PT worked with her and they recommend skilled nursing facility.  Neurology recommended for precautionary for her stroke and recommended dual antiplatelet therapy for 3 weeks and then just back to monotherapy with clopidogrel.    She was stable to be discharged to skilled nursing facility as patient's family has picked her facility however insurance authorization still not obtained yet and will be done tomorrow  Discharge Diagnoses:  Principal Problem:   CVA (cerebral vascular accident) [facility]) Active Problems:   HTN (hypertension)   Vertigo  Acute Right Hippocampal CVA Vertigo from BPPV -Patient is presenting with complaints of dizziness and nausea but neurology does not feel like the CVA is related to her dizziness -Symptom onset 12/2 around 8 AM. -She has a history of prior CVA/TIA on Plavix. -MRI concerning for unilateral right hippocampal restriction diffusion and swelling. No associated hemorrhage or mass-effect. Findings favor acute ischemia. -ED provider discussed the case with Dr. Merrily Pew neurology who reviewed the patient's imaging and agrees that this is an acute ischemic change but does not feel it is the cause of her vertigo. BPPV is also on the differential for vertigo. -C/wTelemetry monitoringto evaluate for possible atrial fibrillation -Allow for permissive hypertension-treat only if systolic blood pressures are greater than 220. -CT Head  and Neck done and showed "No acute intracranial hemorrhage. Recent right hippocampal infarction is better seen on MRI. Stable chronic  findings detailed above. Significant intracranial atherosclerosis primarily involving medium and smaller branch vessels. No hemodynamically significant stenosis in the neck." -TransThoracic 2DEchocardiogramordered and to be done; EEG also being done by Neurology along with LE Venous Duplex -Hemoglobin A1c was 5.7 -Lipid Panel as below -ReceivedAspirin 324 mg daily: Neurology also recommending starting high intensitystatin but ? Intolerance so will need to discuss and Start Statin in AM -Frequent neurochecks -PT, OT, speech therapy.PT to do Vestibular Testing; Had a Positive Dix-Hallpike Maneuver and will need SNF for Rehab  -C/w Fall Precautions  -N.p.o. until cleared by bedside swallow evaluation or formal speech evaluation and had no needs  -Neurology will see the patient and recommending DAPT with ASA and Plavix for 3 weeks and then Plavix daily  -EEG showed no Seizure  -ContinueMeclizine 12.5 mg po Dailyprn  -Antiemetic with Zofran 4 mg IV q6hprn Nauesa/Vomiting as needed -Patient passed bedside swallow screen so we will start on a heart healthy diet -May need a Loop Recorder per Neuro but after review of her Imaging they feel she has very stenosis'd intracranial vessels and for now they are recommending a 30-day monitor rule out atrial fibrillation in outpatient setting -Dizziness is improving -Had a lower extremity duplex done and showed no evidence of DVT -ECHOCARDIOGRAM showed "Left ventricular ejection fraction, by visual estimation, is 60 to 65%. The left ventricle has normal function. There is no left ventricular hypertrophy. 2. Left ventricular diastolic parameters are consistent with Grade I diastolic dysfunction (impaired relaxation)." -Resume PitavaStatin at D/C and increase dose to 4 mg weekly per Neurology  -Will not repeat labs as her labs were essentially normal; Repeat Blood work within 1-2 weeks   Hypertension -Allow Permissive Hypertension at this time for  systolic less than [id number] and gradually normalizing 5 to 7 days with long-term blood pressure goal normotensive -BP this AM was 146/66  Hx of SeizureDisorder -Continue Divalproex 500 mg p.o. daily andNeurology checkingan EEG -EEG as below   Hypothyroidism -Check TSH and was 16.178 but free T4 was 1.08 -Continue with Levothyroxine 100 mcg -Follow up TFT's within 4-6 Weeks  Hyperlipidemia -LipidPanel done and showed a total cholesterol/HDL ratio of 3.3, cholesterol level was 280, HDL was 86, LDL was 179, triglycerides were 74, and VLDL15 -Takes Pitavastatin at home weekly and Neurology recommending to increase dose to 4 mg Weekly at D/C   Hyperglycemia -In the setting of Pre-Diabetes -Blood Sugars have been ranging from 106-199 -HbA1c was 5.7 -If necessary will place on Sensitive Novolog SSI  Discharge Instructions Discharge Instructions    Call MD for:  difficulty breathing, headache or visual disturbances   Complete by: As directed    Call MD for:  extreme fatigue   Complete by: As directed    Call MD for:  hives   Complete by: As directed    Call MD for:  persistant dizziness or light-headedness   Complete by: As directed    Call MD for:  persistant nausea and vomiting   Complete by: As directed    Call MD for:  redness, tenderness, or signs of infection (pain, swelling, redness, odor or green/yellow discharge around incision site)   Complete by: As directed    Call MD for:  severe uncontrolled pain   Complete by: As directed    Call MD for:  temperature >100.4   Complete by: As directed  Diet - low sodium heart healthy   Complete by: As directed    Discharge instructions   Complete by: As directed    You were cared for by a hospitalist during your hospital stay. If you have any questions about your discharge medications or the care you received while you were in the hospital after you are discharged, you can call the unit and ask to speak with the hospitalist  on call if the hospitalist that took care of you is not available. Once you are discharged, your primary care physician will handle any further medical issues. Please note that NO REFILLS for any discharge medications will be authorized once you are discharged, as it is imperative that you return to your primary care physician (or establish a relationship with a primary care physician if you do not have one) for your aftercare needs so that they can reassess your need for medications and monitor your lab values.  Follow up with PCP within 1 week and with Neurology within 4 weeks. Take all medications as prescribed. If symptoms change or worsen please return to the ED for evaluation   Increase activity slowly   Complete by: As directed      Allergies as of 01/30/2019      Reactions   Escitalopram Oxalate Nausea Only   Lisinopril Other (See Comments)   Very decreased blood pressure    Statins    myalgias   Sulfa Antibiotics    hives      Medication List    TAKE these medications   acetaminophen 325 MG tablet Commonly known as: TYLENOL Take 325 mg by mouth every 6 (six) hours as needed for mild pain.   amLODipine 5 MG tablet Commonly known as: NORVASC Take 5 mg by mouth daily.   aspirin 81 MG EC tablet Take 1 tablet (81 mg total) by mouth daily for 21 days. Start taking on: January 31, 2019   Calcium 600/Vitamin D3 600-800 MG-UNIT Tabs Generic drug: Calcium Carb-Cholecalciferol Take 1 tablet by mouth 2 (two) times daily.   clopidogrel 75 MG tablet Commonly known as: PLAVIX Take 1 tablet (75 mg total) by mouth daily.   divalproex 500 MG 24 hr tablet Commonly known as: DEPAKOTE ER Take 1 tablet (500 mg total) by mouth daily.   levothyroxine 100 MCG tablet Commonly known as: SYNTHROID TAKE 1 TABLET (100 MCG TOTAL) BY MOUTH DAILY. TAKING 6 DAYS A WEEK. DON'T TAKE ON SUNDAYS What changed: See the new instructions.   Livalo 2 MG Tabs Generic drug: Pitavastatin Calcium Take 2  tablets (4 mg total) by mouth once a week. What changed:   how much to take  additional instructions   meclizine 12.5 MG tablet Commonly known as: ANTIVERT Take 1 tablet (12.5 mg total) by mouth 3 (three) times daily as needed for dizziness.   multivitamin with minerals Tabs tablet Take 1 tablet by mouth daily.   pantoprazole 40 MG tablet Commonly known as: PROTONIX Take 40 mg by mouth daily.   sertraline 25 MG tablet Commonly known as: ZOLOFT Take 25 mg by mouth daily.      Follow-up Information    [facility]. Go on 03/01/2019.   Specialty: Neurology Contact information: [place] [phone]         Allergies  Allergen Reactions  . Escitalopram Oxalate Nausea Only  . Lisinopril Other (See Comments)    Very decreased blood pressure   . Statins  myalgias  . Sulfa Antibiotics     hives   Consultations:  Neurology  Procedures/Studies: Ct Angio Head W Or Wo Contrast  Result Date: 01/27/2019 CLINICAL DATA:  Stroke, follow-up EXAM: CT ANGIOGRAPHY HEAD AND NECK TECHNIQUE: Multidetector CT imaging of the head and neck was performed using the standard protocol before and during bolus administration of intravenous contrast. Multiplanar CT image reconstructions and MIPs were obtained to evaluate the vascular anatomy. Carotid stenosis measurements (when applicable) are obtained utilizing NASCET criteria, using the distal internal carotid diameter as the denominator. CONTRAST:  80mL OMNIPAQUE IOHEXOL 350 MG/ML SOLN COMPARISON:  Prior CT and MR imaging FINDINGS: CT HEAD FINDINGS Brain: Ill-defined hypoattenuation is present along the right hippocampus corresponding to abnormal signal on prior MRI. There is no acute intracranial hemorrhage. Small area of chronic infarction is again identified involving the lateral right frontoparietal lobes. Additional patchy and confluent areas of hypoattenuation in the  supratentorial white matter likely reflect stable chronic microvascular ischemic changes. Prominence of the ventricles and sulci reflects stable parenchymal volume loss. Vascular: No hyperdense vessel. There is intracranial atherosclerotic calcification at the skull base. Skull: Unremarkable. Sinuses: Aerated. Orbits: No acute finding. Review of the MIP images confirms the above findings CTA NECK FINDINGS Aortic arch: Great vessel origins are patent. Right carotid system: Common carotid is patent. Primarily calcified plaque is present at the bifurcation causing minimal stenosis. Internal and external carotid arteries are patent. No hemodynamically significant stenosis or evidence of dissection. Left carotid system: Common carotid is patent. Primarily calcified plaque is present at the bifurcation causing mild, less than 50% stenosis. Internal and external carotid arteries are patent. No hemodynamically significant stenosis or evidence of dissection. Vertebral arteries: Patent. Right vertebral artery is dominant. No significant stenosis or evidence of dissection. Skeleton: Degenerative changes of the included spine. Other neck: Multinodular thyroid. Upper chest: No apical lung mass. Review of the MIP images confirms the above findings CTA HEAD FINDINGS Anterior circulation: Intracranial internal carotid arteries are patent with calcified plaque but no significant stenosis. Anterior and middle cerebral arteries are patent. Multifocal stenoses are present of a M2 MCA, A2 ACA, and more distal branches bilaterally. Several of these are severe. Posterior circulation: Intracranial right vertebral artery is patent. The intracranial left vertebral artery appears to be chronically occluded beyond the PICA origin. Basilar artery is patent. Posterior cerebral arteries are patent. There is a right posterior communicating artery present. There is mild stenosis of the basilar artery. There is irregularity with mild to moderate  stenoses of the posterior cerebral arteries. There is a more distal high-grade stenosis of an occipital branch on the right. Venous sinuses: Patent as permitted by contrast timing. Review of the MIP images confirms the above findings IMPRESSION: No acute intracranial hemorrhage. Recent right hippocampal infarction is better seen on MRI. Stable chronic findings detailed above. Significant intracranial atherosclerosis primarily involving medium and smaller branch vessels. No hemodynamically significant stenosis in the neck. Electronically Signed   By: Macy Mis M.D.   On: 01/27/2019 09:41   Ct Angio Neck W Or Wo Contrast  Result Date: 01/27/2019 CLINICAL DATA:  Stroke, follow-up EXAM: CT ANGIOGRAPHY HEAD AND NECK TECHNIQUE: Multidetector CT imaging of the head and neck was performed using the standard protocol before and during bolus administration of intravenous contrast. Multiplanar CT image reconstructions and MIPs were obtained to evaluate the vascular anatomy. Carotid stenosis measurements (when applicable) are obtained utilizing NASCET criteria, using the distal internal carotid diameter as the denominator. CONTRAST:  42mL OMNIPAQUE  IOHEXOL 350 MG/ML SOLN COMPARISON:  Prior CT and MR imaging FINDINGS: CT HEAD FINDINGS Brain: Ill-defined hypoattenuation is present along the right hippocampus corresponding to abnormal signal on prior MRI. There is no acute intracranial hemorrhage. Small area of chronic infarction is again identified involving the lateral right frontoparietal lobes. Additional patchy and confluent areas of hypoattenuation in the supratentorial white matter likely reflect stable chronic microvascular ischemic changes. Prominence of the ventricles and sulci reflects stable parenchymal volume loss. Vascular: No hyperdense vessel. There is intracranial atherosclerotic calcification at the skull base. Skull: Unremarkable. Sinuses: Aerated. Orbits: No acute finding. Review of the MIP images  confirms the above findings CTA NECK FINDINGS Aortic arch: Great vessel origins are patent. Right carotid system: Common carotid is patent. Primarily calcified plaque is present at the bifurcation causing minimal stenosis. Internal and external carotid arteries are patent. No hemodynamically significant stenosis or evidence of dissection. Left carotid system: Common carotid is patent. Primarily calcified plaque is present at the bifurcation causing mild, less than 50% stenosis. Internal and external carotid arteries are patent. No hemodynamically significant stenosis or evidence of dissection. Vertebral arteries: Patent. Right vertebral artery is dominant. No significant stenosis or evidence of dissection. Skeleton: Degenerative changes of the included spine. Other neck: Multinodular thyroid. Upper chest: No apical lung mass. Review of the MIP images confirms the above findings CTA HEAD FINDINGS Anterior circulation: Intracranial internal carotid arteries are patent with calcified plaque but no significant stenosis. Anterior and middle cerebral arteries are patent. Multifocal stenoses are present of a M2 MCA, A2 ACA, and more distal branches bilaterally. Several of these are severe. Posterior circulation: Intracranial right vertebral artery is patent. The intracranial left vertebral artery appears to be chronically occluded beyond the PICA origin. Basilar artery is patent. Posterior cerebral arteries are patent. There is a right posterior communicating artery present. There is mild stenosis of the basilar artery. There is irregularity with mild to moderate stenoses of the posterior cerebral arteries. There is a more distal high-grade stenosis of an occipital branch on the right. Venous sinuses: Patent as permitted by contrast timing. Review of the MIP images confirms the above findings IMPRESSION: No acute intracranial hemorrhage. Recent right hippocampal infarction is better seen on MRI. Stable chronic findings  detailed above. Significant intracranial atherosclerosis primarily involving medium and smaller branch vessels. No hemodynamically significant stenosis in the neck. Electronically Signed   By: Macy Mis M.D.   On: 01/27/2019 09:41   Mr Brain Wo Contrast  Result Date: 01/27/2019 CLINICAL DATA:  85 year old female with acute onset dizziness 12-24 hours ago. No mention of seizure like activity. EXAM: MRI HEAD WITHOUT CONTRAST TECHNIQUE: Multiplanar, multiecho pulse sequences of the brain and surrounding structures were obtained without intravenous contrast. COMPARISON:  Brain MRI 03/20/2016. FINDINGS: Brain: Confluent restricted diffusion throughout the head and body of the right hippocampus (series 7, image 52). Associated T2 hyperintensity and mild hippocampal swelling on series 17, image 15. No associated hemorrhage or regional mass effect. No other restricted diffusion. No midline shift, mass effect, evidence of mass lesion, ventriculomegaly, extra-axial collection or acute intracranial hemorrhage. Cervicomedullary junction and pituitary are within normal limits. There is patchy chronic encephalomalacia at the right frontal operculum, stable. Patchy and confluent superimposed bilateral cerebral white matter T2 and FLAIR hyperintensity is stable since 2018. Similar patchy T2 signal in the bilateral deep gray nuclei and pons also appears stable. Cerebellum remains negative. There are new chronic microhemorrhages in the central pons and the left deep cerebellar nuclei since 2018 (  series 14, image 20, 13). No other cortical encephalomalacia or chronic cerebral blood products identified. Vascular: Major intracranial vascular flow voids are stable. Skull and upper cervical spine: Normal for age visible cervical spine. Normal bone marrow signal. Sinuses/Orbits: Stable and negative. Other: Mastoids remain clear. Visible internal auditory structures appear normal. Scalp and face soft tissues appear negative.  IMPRESSION: 1. Confluent unilateral right hippocampal restricted diffusion and swelling. No associated hemorrhage or mass effect. In this clinical setting I favor this reflects acute ischemia. 2. Underlying chronic small vessel disease with mild progression since 2018. Electronically Signed   By: Genevie Ann M.D.   On: 01/27/2019 01:29   Vas [place] Lower Extremity Venous (dvt)  Result Date: 01/27/2019  Lower Venous Study Indications: Stroke.  Comparison Study: no prior Performing Technologist: June Leap RDMS, RVT  Examination Guidelines: A complete evaluation includes B-mode imaging, spectral Doppler, color Doppler, and power Doppler as needed of all accessible portions of each vessel. Bilateral testing is considered an integral part of a complete examination. Limited examinations for reoccurring indications may be performed as noted.  +---------+---------------+---------+-----------+----------+--------------+ RIGHT    CompressibilityPhasicitySpontaneityPropertiesThrombus Aging +---------+---------------+---------+-----------+----------+--------------+ CFV      Full           Yes      Yes                                 +---------+---------------+---------+-----------+----------+--------------+ SFJ      Full                                                        +---------+---------------+---------+-----------+----------+--------------+ FV Prox  Full                                                        +---------+---------------+---------+-----------+----------+--------------+ FV Mid   Full                                                        +---------+---------------+---------+-----------+----------+--------------+ FV DistalFull                                                        +---------+---------------+---------+-----------+----------+--------------+ PFV      Full                                                         +---------+---------------+---------+-----------+----------+--------------+ POP      Full           Yes      Yes                                 +---------+---------------+---------+-----------+----------+--------------+  PTV      Full                                                        +---------+---------------+---------+-----------+----------+--------------+ PERO     Full                                                        +---------+---------------+---------+-----------+----------+--------------+   +---------+---------------+---------+-----------+----------+--------------+ LEFT     CompressibilityPhasicitySpontaneityPropertiesThrombus Aging +---------+---------------+---------+-----------+----------+--------------+ CFV      Full           Yes      Yes                                 +---------+---------------+---------+-----------+----------+--------------+ SFJ      Full                                                        +---------+---------------+---------+-----------+----------+--------------+ FV Prox  Full                                                        +---------+---------------+---------+-----------+----------+--------------+ FV Mid   Full                                                        +---------+---------------+---------+-----------+----------+--------------+ FV DistalFull                                                        +---------+---------------+---------+-----------+----------+--------------+ PFV      Full                                                        +---------+---------------+---------+-----------+----------+--------------+ POP      Full           Yes      Yes                                 +---------+---------------+---------+-----------+----------+--------------+ PTV      Full                                                         +---------+---------------+---------+-----------+----------+--------------+  PERO     Full                                                        +---------+---------------+---------+-----------+----------+--------------+     Summary: Right: There is no evidence of deep vein thrombosis in the lower extremity. No cystic structure found in the popliteal fossa. Left: There is no evidence of deep vein thrombosis in the lower extremity. No cystic structure found in the popliteal fossa.  *See table(s) above for measurements and observations. Electronically signed by Servando Snare MD on 01/27/2019 at 6:11:20 PM.    Final    Subjective: Seen and examined at bedside and she is doing well.  Had no complaints and did not feel dizzy today.  No lightheadedness or dizziness.  8 without issues.  No other concerns or questions-is stable be discharged to skilled nursing facility and will need to follow-up with PCP and neurology in the outpatient setting  Discharge Exam: Vitals:   01/30/19 0748 01/30/19 1121  BP: (!) 139/59 (!) 146/66  Pulse: 72 88  Resp: 16 20  Temp:  98.6 F (37 C)  SpO2: 98% 98%   Vitals:   01/29/19 2330 01/30/19 0400 01/30/19 0748 01/30/19 1121  BP: (!) 142/66 (!) 132/59 (!) 139/59 (!) 146/66  Pulse: 79 73 72 88  Resp: 16 16 16 20   Temp: 98.6 F (37 C) 97.7 F (36.5 C)  98.6 F (37 C)  TempSrc: Oral Oral  Oral  SpO2: 97% 98% 98% 98%   General: Pt is alert, awake, not in acute distress Cardiovascular: RRR, S1/S2 +, no rubs, no gallops Respiratory: Diminished bilaterally, no wheezing, no rhonchi Abdominal: Soft, NT, ND, bowel sounds + Extremities: no edema, no cyanosis  The results of significant diagnostics from this hospitalization (including imaging, microbiology, ancillary and laboratory) are listed below for reference.    Microbiology: Recent Results (from the past 240 hour(s))  SARS CORONAVIRUS 2 (TAT 6-24 HRS) Nasopharyngeal Nasopharyngeal Swab     Status: None    Collection Time: 01/26/19  9:08 PM   Specimen: Nasopharyngeal Swab  Result Value Ref Range Status   SARS Coronavirus 2 NEGATIVE NEGATIVE Final    Comment: (NOTE) SARS-CoV-2 target nucleic acids are NOT DETECTED. The SARS-CoV-2 RNA is generally detectable in upper and lower respiratory specimens during the acute phase of infection. Negative results do not preclude SARS-CoV-2 infection, do not rule out co-infections with other pathogens, and should not be used as the sole basis for treatment or other patient management decisions. Negative results must be combined with clinical observations, patient history, and epidemiological information. The expected result is Negative. Fact Sheet for Patients: SugarRoll.be Fact Sheet for Healthcare Providers: https://www.woods-mathews.com/ This test is not yet approved or cleared by the [place] FDA and  has been authorized for detection and/or diagnosis of SARS-CoV-2 by FDA under an Emergency Use Authorization (EUA). This EUA will remain  in effect (meaning this test can be used) for the duration of the COVID-19 declaration under Section 56 4(b)(1) of the Act, 21 U.S.C. section 360bbb-3(b)(1), unless the authorization is terminated or revoked sooner. Performed at [facility], [place] [address] [place], [place] [address]   SARS CORONAVIRUS 2 (TAT 6-24 HRS) Nasopharyngeal Nasopharyngeal Swab     Status: None   Collection Time: 01/28/19  6:04 PM  Specimen: Nasopharyngeal Swab  Result Value Ref Range Status   SARS Coronavirus 2 NEGATIVE NEGATIVE Final    Comment: (NOTE) SARS-CoV-2 target nucleic acids are NOT DETECTED. The SARS-CoV-2 RNA is generally detectable in upper and lower respiratory specimens during the acute phase of infection. Negative results do not preclude SARS-CoV-2 infection, do not rule out co-infections with other pathogens, and should not be used as the sole basis for  treatment or other patient management decisions. Negative results must be combined with clinical observations, patient history, and epidemiological information. The expected result is Negative. Fact Sheet for Patients: SugarRoll.be Fact Sheet for Healthcare Providers: https://www.woods-mathews.com/ This test is not yet approved or cleared by the [place] FDA and  has been authorized for detection and/or diagnosis of SARS-CoV-2 by FDA under an Emergency Use Authorization (EUA). This EUA will remain  in effect (meaning this test can be used) for the duration of the COVID-19 declaration under Section 56 4(b)(1) of the Act, 21 U.S.C. section 360bbb-3(b)(1), unless the authorization is terminated or revoked sooner. Performed at [facility], [place] [address], [place], [place] [address]     Labs: BNP (last 3 results) No results for input(s): BNP in the last 8760 hours. Basic Metabolic Panel: Recent Labs  Lab 01/26/19 2009 01/26/19 2046 01/27/19 1000 01/28/19 0234  NA 139 136 138 139  K 3.6 3.9 3.5 3.5  CL 100  --  102 104  CO2 24  --  24 27  GLUCOSE 125*  --  199* 106*  BUN 13  --  8 9  CREATININE 0.59  --  0.68 0.74  CALCIUM 10.0  --  9.2 9.2  MG  --   --  1.8 1.7  PHOS  --   --  2.4* 3.3   Liver Function Tests: Recent Labs  Lab 01/27/19 1000 01/28/19 0234  AST 24 20  ALT 12 8  ALKPHOS 50 44  BILITOT 0.7 0.8  PROT 6.7 5.8*  ALBUMIN 3.9 3.3*   No results for input(s): LIPASE, AMYLASE in the last 168 hours. No results for input(s): AMMONIA in the last 168 hours. CBC: Recent Labs  Lab 01/26/19 2009 01/26/19 2046 01/27/19 1000 01/28/19 0234  WBC 7.4  --  8.0 6.2  NEUTROABS 4.3  --  5.3 2.7  HGB 13.5 12.9 13.5 12.3  HCT 41.5 38.0 42.0 37.4  MCV 93.3  --  94.0 91.9  PLT 158  --  163 151   Cardiac Enzymes: No results for input(s): CKTOTAL, CKMB, CKMBINDEX, TROPONINI in the last 168 hours. BNP: Invalid  input(s): POCBNP CBG: No results for input(s): GLUCAP in the last 168 hours. D-Dimer No results for input(s): DDIMER in the last 72 hours. Hgb A1c No results for input(s): HGBA1C in the last 72 hours. Lipid Profile No results for input(s): CHOL, HDL, LDLCALC, TRIG, CHOLHDL, LDLDIRECT in the last 72 hours. Thyroid function studies Recent Labs    01/28/19 0234  TSH 16.178*   Anemia work up No results for input(s): VITAMINB12, FOLATE, FERRITIN, TIBC, IRON, RETICCTPCT in the last 72 hours. Urinalysis    Component Value Date/Time   COLORURINE STRAW (A) 01/26/2019 2250   APPEARANCEUR CLEAR 01/26/2019 2250   LABSPEC 1.006 01/26/2019 2250   PHURINE 7.0 01/26/2019 2250   GLUCOSEU NEGATIVE 01/26/2019 2250   HGBUR NEGATIVE 01/26/2019 2250   BILIRUBINUR NEGATIVE 01/26/2019 2250   KETONESUR 20 (A) 01/26/2019 2250   PROTEINUR NEGATIVE 01/26/2019 2250   UROBILINOGEN 0.2 03/21/2014 1209  NITRITE NEGATIVE 01/26/2019 2250   LEUKOCYTESUR NEGATIVE 01/26/2019 2250   Sepsis Labs Invalid input(s): PROCALCITONIN,  WBC,  LACTICIDVEN Microbiology Recent Results (from the past 240 hour(s))  SARS CORONAVIRUS 2 (TAT 6-24 HRS) Nasopharyngeal Nasopharyngeal Swab     Status: None   Collection Time: 01/26/19  9:08 PM   Specimen: Nasopharyngeal Swab  Result Value Ref Range Status   SARS Coronavirus 2 NEGATIVE NEGATIVE Final    Comment: (NOTE) SARS-CoV-2 target nucleic acids are NOT DETECTED. The SARS-CoV-2 RNA is generally detectable in upper and lower respiratory specimens during the acute phase of infection. Negative results do not preclude SARS-CoV-2 infection, do not rule out co-infections with other pathogens, and should not be used as the sole basis for treatment or other patient management decisions. Negative results must be combined with clinical observations, patient history, and epidemiological information. The expected result is Negative. Fact Sheet for  Patients: SugarRoll.be Fact Sheet for Healthcare Providers: https://www.woods-mathews.com/ This test is not yet approved or cleared by the [place] FDA and  has been authorized for detection and/or diagnosis of SARS-CoV-2 by FDA under an Emergency Use Authorization (EUA). This EUA will remain  in effect (meaning this test can be used) for the duration of the COVID-19 declaration under Section 56 4(b)(1) of the Act, 21 U.S.C. section 360bbb-3(b)(1), unless the authorization is terminated or revoked sooner. Performed at [facility], [place] [address], [place], [place] [address]   SARS CORONAVIRUS 2 (TAT 6-24 HRS) Nasopharyngeal Nasopharyngeal Swab     Status: None   Collection Time: 01/28/19  6:04 PM   Specimen: Nasopharyngeal Swab  Result Value Ref Range Status   SARS Coronavirus 2 NEGATIVE NEGATIVE Final    Comment: (NOTE) SARS-CoV-2 target nucleic acids are NOT DETECTED. The SARS-CoV-2 RNA is generally detectable in upper and lower respiratory specimens during the acute phase of infection. Negative results do not preclude SARS-CoV-2 infection, do not rule out co-infections with other pathogens, and should not be used as the sole basis for treatment or other patient management decisions. Negative results must be combined with clinical observations, patient history, and epidemiological information. The expected result is Negative. Fact Sheet for Patients: SugarRoll.be Fact Sheet for Healthcare Providers: https://www.woods-mathews.com/ This test is not yet approved or cleared by the [place] FDA and  has been authorized for detection and/or diagnosis of SARS-CoV-2 by FDA under an Emergency Use Authorization (EUA). This EUA will remain  in effect (meaning this test can be used) for the duration of the COVID-19 declaration under Section 56 4(b)(1) of the Act, 21 U.S.C. section  360bbb-3(b)(1), unless the authorization is terminated or revoked sooner. Performed at [facility], [place] [address], [place], [place] [address]    Time coordinating discharge: 35 minutes  SIGNED:  Kerney Elbe, DO [facility] 01/30/2019, 1:37 PM Pager is on [phone]  If 7PM-7AM, please contact night-coverage [email or web address] Password [email or web address]

## 2019-01-30 NOTE — TOC Progression Note (Signed)
Transition of Care [facility]) - Progression Note    Patient Details  Name: Danielle Bryan MRN: [id number] Date of Birth: 01-17-1924  Transition of Care [facility] CM/SW [place], [place] Phone Number: 01/30/2019, 12:59 PM  Clinical Narrative:     Patient's son chose [facility] for SNF. Soy, admissions director at [facility] will start insurance authorization on Monday.   CSW will continue to follow and assist with disposition planning.   Expected Discharge Plan: [place] Barriers to Discharge: Continued Medical Work up  Expected Discharge Plan and Services Expected Discharge Plan: [place] In-house Referral: Clinical Social Work Discharge Planning Services: CM Consult Post Acute Care Choice: [place] arrangements for the past 2 months: Single Family Home                                       Social Determinants of Health (SDOH) Interventions    Readmission Risk Interventions No flowsheet data found.

## 2019-01-31 LAB — SARS CORONAVIRUS 2 (TAT 6-24 HRS): SARS Coronavirus 2: NEGATIVE

## 2019-01-31 NOTE — Care Management Important Message (Signed)
Important Message  Patient Details  Name: Danielle Bryan MRN: [id number] Date of Birth: 10/03/1923   Medicare Important Message Given:  Yes     Darlin Stenseth [address] 01/31/2019, 4:47 PM

## 2019-01-31 NOTE — Progress Notes (Signed)
PROGRESS NOTE    Danielle Bryan  [id number] DOB: [birth year] DOA: 01/26/2019 PCP: Velna Hatchet, MD   Brief Narrative:  The patient is a 85 year old Caucasian elderly female with a past medical history significant for but not limited to CVA and TIA on Plavix, hypertension, hyperlipidemia, hypothyroidism, osteoarthritis, situational depression, and other comorbidities who presented with a chief complaint of dizziness and nausea. She woke up yesterday morning at 12 to around 8 AM and was trying to sit up in the bed started feeling dizzy and nauseous. She states she vomited a little bit and has continued to feel dizzy and nauseous and has not been able to ambulate without support. She denies any focal weakness or numbness and denies any headaches changes in vision but describes the dizziness as a room spinning sensation. She stated that is worse every time she moves her head and because she was extremely symptomatic she is brought to the emergency room and she is not able to ambulate. MRI was done was concerning for a unilateral hippocampal restriction diffusion is swelling with no associated hemorrhage or mass-effect these findings are favoring acute ischemia. The ED provider paged and discussed the case with Dr. Lorraine Lax of neurology who reviewed the images and agrees that there is an acute ischemic change but did not feel that it is the cause of her vertigo. Neurology formally consulted and she was given aspirin 324 mg,Zofran and a 500 mL bolus of normal saline  Further work-up revealed she had some BPPV as Dix-Hallpike Maneuver was positive as well as in incidental R Hippocampal CVA likely 2/2 to Intracranial Atherosclerosis. PT worked with her and they recommend skilled nursing facility. Neurology recommended for precautionary for her stroke and recommended dual antiplatelet therapy for 3 weeks and then just back to monotherapy with clopidogrel.   She was stable to be discharged to skilled  nursing facility as patient's family has picked her facility however insurance authorization still not obtained and they require a repeat COVID test which has been ordered.  Assessment & Plan:   Principal Problem:   CVA (cerebral vascular accident) ([place]) Active Problems:   HTN (hypertension)   Vertigo  AcuteRightHippocampal CVA Vertigo from BPPV -Patient is presenting with complaints of dizziness and nausea but neurology does not feel like the CVA is related to her dizziness -Symptom onset 12/2 around 8 AM. -She has a history of prior CVA/TIA on Plavix. -MRI concerning for unilateral right hippocampal restriction diffusion and swelling. No associated hemorrhage or mass-effect. Findings favor acute ischemia. -ED provider discussed the case with Dr. Merrily Pew neurology who reviewed the patient's imaging and agrees that this is an acute ischemic change but does not feel it is the cause of her vertigo. BPPV is also on the differential for vertigo. -C/wTelemetry monitoringto evaluate for possible atrial fibrillation -Allow for permissive hypertension-treat only if systolic blood pressures are greater than 220. -CT Head and Neck done and showed "No acute intracranial hemorrhage. Recent right hippocampal infarction is better seen on MRI. Stable chronic findings detailed above. Significant intracranial atherosclerosis primarily involving medium and smaller branch vessels. No hemodynamically significant stenosis in the neck." -TransThoracic 2DEchocardiogramordered and to be done; EEG also being done by Neurology along with LE Venous Duplex -Hemoglobin A1c was 5.7 -Lipid Panel as below -ReceivedAspirin 324 mg daily: Neurology also recommending starting high intensitystatin but ? Intolerance so will need to discuss and Start Statin in AM -Frequent neurochecks -PT, OT, speech therapy.PT to do Vestibular Testing; Had a Positive Dix-Hallpike Maneuver and  will need SNF for Rehab  -C/w  Fall Precautions  -N.p.o. until cleared by bedside swallow evaluation or formal speech evaluation and had no needs  -Neurology will see the patient and recommending DAPT with ASA and Plavix for 3 weeks and then Plavix daily -EEG showed no Seizure  -ContinueMeclizine12.5 mg po Dailyprn -Antiemeticwith Zofran 4 mg IV q6hprn Nauesa/Vomitingas needed -Patient passed bedside swallow screen so we will start on a heart healthy diet -May need a Loop Recorder per Neuro but after review of her Imaging they feel she has very stenosis'd intracranial vessels and for now they are recommending a 30-day monitor rule out atrial fibrillation in outpatient setting -Dizziness is improving -Had a lower extremity duplex done and showed no evidence of DVT -ECHOCARDIOGRAM showed "Left ventricular ejection fraction, by visual estimation, is 60 to 65%. The left ventricle has normal function. There is no left ventricular hypertrophy. 2. Left ventricular diastolic parameters are consistent with Grade I diastolic dysfunction (impaired relaxation)." -ResumePitavaStatin at D/C and increase dose to 4 mg weekly per Neurology -Will not repeat labs as her labs were essentially normal; Repeat Blood work within 1-2 weeks  -REPEAT COVID TEST Again as required by Facility   Hypertension -Allow Permissive Hypertension at this time for systolic less than [id number] and gradually normalizing 5 to 7 days with long-term blood pressure goal normotensive -BP this AM was 157/70  Hx of SeizureDisorder -Continue Divalproex 500 mg p.o. daily andNeurology checkingan EEG -EEG as below   Hypothyroidism -Check TSH and was 16.178 but free T4 was 1.08 -Continue with Levothyroxine 100 mcg -Follow up TFT's within 4-6 Weeks  Hyperlipidemia -LipidPanel done and showed a total cholesterol/HDL ratio of 3.3, cholesterol level was 280, HDL was 86, LDL was 179, triglycerides were 74, and VLDL15 -Takes Pitavastatin at home weekly and  Neurology recommending to increase dose to 4 mg Weekly at D/C  Hyperglycemia -In the setting of Pre-Diabetes -Blood Sugars have been ranging from 106-199 -HbA1c was 5.7 -If necessary will place on Sensitive Novolog SSI  DVT prophylaxis: Enoxaparin 30 mg sq q24h Code Status: FULL CODE Family Communication: No family present at bedside during examination Disposition Plan: SNF when COVID repeated and When Insurance Authorization is obtained  Consultants:  Neurology    Procedures:  ECHOCARDIOGRAM IMPRESSIONS    1. Left ventricular ejection fraction, by visual estimation, is 60 to 65%. The left ventricle has normal function. There is no left ventricular hypertrophy.  2. Left ventricular diastolic parameters are consistent with Grade I diastolic dysfunction (impaired relaxation).  3. Global right ventricle has normal systolic function.The right ventricular size is normal. No increase in right ventricular wall thickness.  4. Left atrial size was normal.  5. Right atrial size was normal.  6. Mild mitral annular calcification.  7. The mitral valve is normal in structure. No evidence of mitral valve regurgitation. No evidence of mitral stenosis.  8. The tricuspid valve is normal in structure. Tricuspid valve regurgitation is mild.  9. The aortic valve is tricuspid. Aortic valve regurgitation is not visualized. Mild aortic valve sclerosis without stenosis. 10. The pulmonic valve was normal in structure. Pulmonic valve regurgitation is not visualized. 11. Normal pulmonary artery systolic pressure. 12. The inferior vena cava is normal in size with greater than 50% respiratory variability, suggesting right atrial pressure of 3 mmHg.  FINDINGS  Left Ventricle: Left ventricular ejection fraction, by visual estimation, is 60 to 65%. The left ventricle has normal function. There is no left ventricular hypertrophy. Left ventricular diastolic  parameters are consistent with Grade I diastolic   dysfunction (impaired relaxation). Normal left atrial pressure.  Right Ventricle: The right ventricular size is normal. No increase in right ventricular wall thickness. Global RV systolic function is has normal systolic function. The tricuspid regurgitant velocity is 2.34 m/s, and with an assumed right atrial pressure  of 3 mmHg, the estimated right ventricular systolic pressure is normal at 24.9 mmHg.  Left Atrium: Left atrial size was normal in size.  Right Atrium: Right atrial size was normal in size  Pericardium: There is no evidence of pericardial effusion.  Mitral Valve: The mitral valve is normal in structure. Mild mitral annular calcification. No evidence of mitral valve stenosis by observation. No evidence of mitral valve regurgitation.  Tricuspid Valve: The tricuspid valve is normal in structure. Tricuspid valve regurgitation is mild.  Aortic Valve: The aortic valve is tricuspid. Aortic valve regurgitation is not visualized. Mild aortic valve sclerosis is present, with no evidence of aortic valve stenosis.  Pulmonic Valve: The pulmonic valve was normal in structure. Pulmonic valve regurgitation is not visualized.  Aorta: The aortic root, ascending aorta and aortic arch are all structurally normal, with no evidence of dilitation or obstruction.  Venous: The inferior vena cava is normal in size with greater than 50% respiratory variability, suggesting right atrial pressure of 3 mmHg.  IAS/Shunts: No atrial level shunt detected by color flow Doppler. There is no evidence of a patent foramen ovale. No ventricular septal defect is seen or detected. There is no evidence of an atrial septal defect.    LEFT VENTRICLE PLAX 2D LVIDd:         3.10 cm  Diastology LVIDs:         2.20 cm  LV e' lateral:   9.90 cm/s LV PW:         0.90 cm  LV E/e' lateral: 9.1 LV IVS:        0.90 cm  LV e' medial:    6.20 cm/s LVOT diam:     1.50 cm  LV E/e' medial:  14.6 LV SV:         22 ml  LV SV Index:   13.24 LVOT Area:     1.77 cm    RIGHT VENTRICLE RV S prime:     16.60 cm/s TAPSE (M-mode): 2.0 cm  LEFT ATRIUM             Index       RIGHT ATRIUM           Index LA diam:        3.20 cm 2.00 cm/m  RA Area:     15.60 cm LA Vol (A2C):   39.3 ml 24.56 ml/m RA Volume:   44.30 ml  27.68 ml/m LA Vol (A4C):   28.4 ml 17.75 ml/m LA Biplane Vol: 33.8 ml 21.12 ml/m  AORTIC VALVE LVOT Vmax:   132.00 cm/s LVOT Vmean:  91.300 cm/s LVOT VTI:    0.265 m   AORTA Ao Root diam: 2.80 cm  MITRAL VALVE                         TRICUSPID VALVE MV Area (PHT): 2.99 cm              TR Peak grad:   21.9 mmHg MV PHT:        73.66 msec            TR Vmax:  234.00 cm/s MV Decel Time: 254 msec MV E velocity: 90.30 cm/s  103 cm/s  SHUNTS MV A velocity: 117.00 cm/s 70.3 cm/s Systemic VTI:  0.26 m MV E/A ratio:  0.77        1.5       Systemic Diam: 1.50 cm  LE VENOUS DUPLEX Summary: Right: There is no evidence of deep vein thrombosis in the lower extremity. No cystic structure found in the popliteal fossa. Left: There is no evidence of deep vein thrombosis in the lower extremity. No cystic structure found in the popliteal fossa  EEG DESCRIPTION:  The posterior dominant rhythm consists of 8 Hz activity of moderate voltage (25-35 uV) seen predominantly in posterior head regions, symmetric and reactive to eye opening and eye closing.  Drowsiness was characterized by attenuation of the posterior background rhythm. EEG showed intermittent 2-3Hz  left temporal delta slowing.  Hyperventilation and photic stimulation were not performed.  ABNORMALITY - Intermittent slow, left temporal  IMPRESSION: This study is suggestive of non specific cortical dysfunction in left temporal region. No seizures or epileptiform discharges were seen throughout the recording.  Antimicrobials:  Anti-infectives (From admission, onward)   None     Subjective: Seen and examined at bedside and she  was feeling sleepy.  No complaints or issue overnight.  No dizziness.  No other concerns or complaints at this time.  Objective: Vitals:   01/30/19 2353 01/31/19 0353 01/31/19 0813 01/31/19 1201  BP: 140/60 134/65 138/72 (!) 157/70  Pulse: 73 81 78 85  Resp: 18 17 18 18   Temp: 98 F (36.7 C) (!) 97.4 F (36.3 C) 98.5 F (36.9 C) 98.2 F (36.8 C)  TempSrc: Oral Oral Oral Oral  SpO2: 97% 100% 98% 100%    Intake/Output Summary (Last 24 hours) at 01/31/2019 1208 Last data filed at 01/31/2019 0900 Gross per 24 hour  Intake 480 ml  Output 1800 ml  Net -1320 ml   There were no vitals filed for this visit.  Examination: Physical Exam:  Constitutional: Thin elderly Caucasian female in NAD and appears calm and comfortable Respiratory: Mildly diminished to auscultation bilaterally, no wheezing, rales, rhonchi or crackles. Normal respiratory effort and patient is not tachypenic. No accessory muscle use.  Cardiovascular: RRR, no murmurs / rubs / gallops. S1 and S2 auscultated. No extremity edema.  Abdomen: Soft, non-tender, non-distended. Bowel sounds positive. .  Skin: No rashes, lesions, ulcers on a limited skin. No induration; Warm and dry.  Neurologic: CN 2-12 grossly intact with no focal deficits. Romberg sign and cerebellar reflexes not assessed.    Data Reviewed: I have personally reviewed following labs and imaging studies  CBC: Recent Labs  Lab 01/26/19 2009 01/26/19 2046 01/27/19 1000 01/28/19 0234  WBC 7.4  --  8.0 6.2  NEUTROABS 4.3  --  5.3 2.7  HGB 13.5 12.9 13.5 12.3  HCT 41.5 38.0 42.0 37.4  MCV 93.3  --  94.0 91.9  PLT 158  --  163 [id number]   Basic Metabolic Panel: Recent Labs  Lab 01/26/19 2009 01/26/19 2046 01/27/19 1000 01/28/19 0234  NA 139 136 138 139  K 3.6 3.9 3.5 3.5  CL 100  --  102 104  CO2 24  --  24 27  GLUCOSE 125*  --  199* 106*  BUN 13  --  8 9  CREATININE 0.59  --  0.68 0.74  CALCIUM 10.0  --  9.2 9.2  MG  --   --  1.8 1.7  PHOS  --    --  2.4* 3.3   GFR: CrCl cannot be calculated (Unknown ideal weight.). Liver Function Tests: Recent Labs  Lab 01/27/19 1000 01/28/19 0234  AST 24 20  ALT 12 8  ALKPHOS 50 44  BILITOT 0.7 0.8  PROT 6.7 5.8*  ALBUMIN 3.9 3.3*   No results for input(s): LIPASE, AMYLASE in the last 168 hours. No results for input(s): AMMONIA in the last 168 hours. Coagulation Profile: No results for input(s): INR, PROTIME in the last 168 hours. Cardiac Enzymes: No results for input(s): CKTOTAL, CKMB, CKMBINDEX, TROPONINI in the last 168 hours. BNP (last 3 results) No results for input(s): PROBNP in the last 8760 hours. HbA1C: No results for input(s): HGBA1C in the last 72 hours. CBG: No results for input(s): GLUCAP in the last 168 hours. Lipid Profile: No results for input(s): CHOL, HDL, LDLCALC, TRIG, CHOLHDL, LDLDIRECT in the last 72 hours. Thyroid Function Tests: No results for input(s): TSH, T4TOTAL, FREET4, T3FREE, THYROIDAB in the last 72 hours. Anemia Panel: No results for input(s): VITAMINB12, FOLATE, FERRITIN, TIBC, IRON, RETICCTPCT in the last 72 hours. Sepsis Labs: No results for input(s): PROCALCITON, LATICACIDVEN in the last 168 hours.  Recent Results (from the past 240 hour(s))  SARS CORONAVIRUS 2 (TAT 6-24 HRS) Nasopharyngeal Nasopharyngeal Swab     Status: None   Collection Time: 01/26/19  9:08 PM   Specimen: Nasopharyngeal Swab  Result Value Ref Range Status   SARS Coronavirus 2 NEGATIVE NEGATIVE Final    Comment: (NOTE) SARS-CoV-2 target nucleic acids are NOT DETECTED. The SARS-CoV-2 RNA is generally detectable in upper and lower respiratory specimens during the acute phase of infection. Negative results do not preclude SARS-CoV-2 infection, do not rule out co-infections with other pathogens, and should not be used as the sole basis for treatment or other patient management decisions. Negative results must be combined with clinical observations, patient history, and  epidemiological information. The expected result is Negative. Fact Sheet for Patients: SugarRoll.be Fact Sheet for Healthcare Providers: https://www.woods-mathews.com/ This test is not yet approved or cleared by the [place] FDA and  has been authorized for detection and/or diagnosis of SARS-CoV-2 by FDA under an Emergency Use Authorization (EUA). This EUA will remain  in effect (meaning this test can be used) for the duration of the COVID-19 declaration under Section 56 4(b)(1) of the Act, 21 U.S.C. section 360bbb-3(b)(1), unless the authorization is terminated or revoked sooner. Performed at [facility], [place] [address], [place], [place] [address]   SARS CORONAVIRUS 2 (TAT 6-24 HRS) Nasopharyngeal Nasopharyngeal Swab     Status: None   Collection Time: 01/28/19  6:04 PM   Specimen: Nasopharyngeal Swab  Result Value Ref Range Status   SARS Coronavirus 2 NEGATIVE NEGATIVE Final    Comment: (NOTE) SARS-CoV-2 target nucleic acids are NOT DETECTED. The SARS-CoV-2 RNA is generally detectable in upper and lower respiratory specimens during the acute phase of infection. Negative results do not preclude SARS-CoV-2 infection, do not rule out co-infections with other pathogens, and should not be used as the sole basis for treatment or other patient management decisions. Negative results must be combined with clinical observations, patient history, and epidemiological information. The expected result is Negative. Fact Sheet for Patients: SugarRoll.be Fact Sheet for Healthcare Providers: https://www.woods-mathews.com/ This test is not yet approved or cleared by the [place] FDA and  has been authorized for detection and/or diagnosis of SARS-CoV-2 by FDA under an Emergency Use Authorization (EUA). This EUA will remain  in effect (meaning this test  can be used) for the duration of the COVID-19  declaration under Section 56 4(b)(1) of the Act, 21 U.S.C. section 360bbb-3(b)(1), unless the authorization is terminated or revoked sooner. Performed at [facility], [place] [address] [place], [place] [address]     Radiology Studies: No results found. Scheduled Meds: . aspirin EC  81 mg Oral Daily  . clopidogrel  75 mg Oral Daily  . divalproex  500 mg Oral Daily  . enoxaparin (LOVENOX) injection  30 mg Subcutaneous Q24H  . levothyroxine  100 mcg Oral Q breakfast  . multivitamin with minerals  1 tablet Oral Daily  . pantoprazole  40 mg Oral Daily   Continuous Infusions:   LOS: 4 days    Kerney Elbe, DO [facility] PAGER is on [place]  If 7PM-7AM, please contact night-coverage [email or web address]

## 2019-01-31 NOTE — TOC Progression Note (Signed)
Transition of Care [facility]) - Progression Note    Patient Details  Name: Danielle Bryan MRN: [id number] Date of Birth: March 01, 1923  Transition of Care [facility] CM/SW [place], [place] Phone Number: 01/31/2019, 10:22 AM  Clinical Narrative:     We are awaiting insurance authorization for the patient. Patient will discharge to [facility] once Josem Kaufmann has been received. CSW informed MD about patient needing updated COVID screen.   CSW will continue to follow and assist with discharge planning.   Expected Discharge Plan: [place] Barriers to Discharge: Continued Medical Work up  Expected Discharge Plan and Services Expected Discharge Plan: [place] In-house Referral: Clinical Social Work Discharge Planning Services: CM Consult Post Acute Care Choice: [place] arrangements for the past 2 months: Single Family Home Expected Discharge Date: 01/30/19                                     Social Determinants of Health (SDOH) Interventions    Readmission Risk Interventions No flowsheet data found.

## 2019-01-31 NOTE — Progress Notes (Signed)
Physical Therapy Treatment Patient Details Name: Danielle Bryan MRN: [id number] DOB: 1923-06-12 Today's Date: 01/31/2019    History of Present Illness 85 y.o. 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  Patient states she woke up yesterday morning 12/2 around 8 AM and as she was trying to sit up in the bed started feeling dizzy and nauseous.  She vomited a little at that time.  Since yesterday she has continued to feel dizzy and nauseous    PT Comments    Pt admitted with above diagnosis. Pt was able to ambulate to bathroom and back with min guard to min assist with RW and cues to stay close to RW as well as cues for posture.  Pt did well overall but continues with deficits with balance.  Needed total assist to be cleaned after urinating and having BM.  Pt states dizziness is resolved therefore did not retest for BPPV.   Pt currently with functional limitations due to the deficits listed below (see PT Problem List). Pt will benefit from skilled PT to increase their independence and safety with mobility to allow discharge to the venue listed below.    Follow Up Recommendations  SNF;Supervision/Assistance - 24 hour     Equipment Recommendations  Rolling walker with 5" wheels(may need short RW)    Recommendations for Other Services       Precautions / Restrictions Precautions Precautions: Fall Restrictions Weight Bearing Restrictions: No    Mobility  Bed Mobility Overal bed mobility: Needs Assistance Bed Mobility: Sit to Supine       Sit to supine: Min assist   General bed mobility comments: Pt requires assist for LB management  Transfers Overall transfer level: Needs assistance Equipment used: Rolling walker (2 wheeled) Transfers: Sit to/from [facility] Sit to Stand: Min guard         General transfer comment: Pt needed min guard assist to  stand with 1 person to RW.  Incr time to rise.  Ambulation/Gait Ambulation/Gait assistance: Min assist Gait Distance (Feet): 45 Feet Assistive device: Rolling walker (2 wheeled) Gait Pattern/deviations: Step-through pattern;Decreased stride length;Drifts right/left;Trunk flexed;Wide base of support;Leaning posteriorly   Gait velocity interpretation: <1.31 ft/sec, indicative of household ambulator General Gait Details: Pt needed cues to stay close to RW.  Pt needed assist to guide RW as well.  Pt takes small steps and needed guidance at gait belt as well.  Overall feels unsteady with gait but stated she was not necessarily dizzy while walking.  No LOB today.  Walked to bathroom and back to bed.    Stairs             Wheelchair Mobility    Modified Rankin (Stroke Patients Only)       Balance Overall balance assessment: Needs assistance Sitting-balance support: Feet supported;Bilateral upper extremity supported;No upper extremity supported Sitting balance-Leahy Scale: Fair   Postural control: Posterior lean Standing balance support: Bilateral upper extremity supported;During functional activity Standing balance-Leahy Scale: Poor Standing balance comment: Relies on BUE support in standing                            Cognition Arousal/Alertness: Awake/alert Behavior During Therapy: WFL for tasks assessed/performed Overall Cognitive Status: Within Functional Limits for tasks assessed  Exercises      General Comments        Pertinent Vitals/Pain Pain Assessment: No/denies pain    Home Living                      Prior Function            PT Goals (current goals can now be found in the care plan section) Acute Rehab PT Goals Patient Stated Goal: to feel better Progress towards PT goals: Progressing toward goals    Frequency    Min 3X/week      PT Plan Current plan remains  appropriate    Co-evaluation              AM-PAC PT "6 Clicks" Mobility   Outcome Measure  Help needed turning from your back to your side while in a flat bed without using bedrails?: A Little Help needed moving from lying on your back to sitting on the side of a flat bed without using bedrails?: A Lot Help needed moving to and from a bed to a chair (including a wheelchair)?: A Little Help needed standing up from a chair using your arms (e.g., wheelchair or bedside chair)?: A Little Help needed to walk in hospital room?: A Little Help needed climbing 3-5 steps with a railing? : A Lot 6 Click Score: 16    End of Session Equipment Utilized During Treatment: Gait belt Activity Tolerance: Patient limited by fatigue Patient left: with call bell/phone within reach;in bed;with bed alarm set Nurse Communication: Mobility status PT Visit Diagnosis: Unsteadiness on feet (R26.81);Muscle weakness (generalized) (M62.81);BPPV;Dizziness and giddiness (R42) BPPV - Right/Left : Right     Time: [id number] PT Time Calculation (min) (ACUTE ONLY): 20 min  Charges:  $Gait Training: 8-22 mins                     Mildred Tuccillo W,PT [place] Pager:  [phone]  Office:  [place] 01/31/2019, 1:37 PM

## 2019-02-01 DIAGNOSIS — I672 Cerebral atherosclerosis: Secondary | ICD-10-CM | POA: Diagnosis not present

## 2019-02-01 DIAGNOSIS — I10 Essential (primary) hypertension: Secondary | ICD-10-CM | POA: Diagnosis not present

## 2019-02-01 DIAGNOSIS — R410 Disorientation, unspecified: Secondary | ICD-10-CM | POA: Diagnosis not present

## 2019-02-01 DIAGNOSIS — R2681 Unsteadiness on feet: Secondary | ICD-10-CM | POA: Diagnosis not present

## 2019-02-01 DIAGNOSIS — M2550 Pain in unspecified joint: Secondary | ICD-10-CM | POA: Diagnosis not present

## 2019-02-01 DIAGNOSIS — Z20828 Contact with and (suspected) exposure to other viral communicable diseases: Secondary | ICD-10-CM | POA: Diagnosis not present

## 2019-02-01 DIAGNOSIS — R7303 Prediabetes: Secondary | ICD-10-CM | POA: Diagnosis not present

## 2019-02-01 DIAGNOSIS — H8111 Benign paroxysmal vertigo, right ear: Secondary | ICD-10-CM | POA: Diagnosis not present

## 2019-02-01 DIAGNOSIS — E039 Hypothyroidism, unspecified: Secondary | ICD-10-CM | POA: Diagnosis not present

## 2019-02-01 DIAGNOSIS — R739 Hyperglycemia, unspecified: Secondary | ICD-10-CM | POA: Diagnosis not present

## 2019-02-01 DIAGNOSIS — I639 Cerebral infarction, unspecified: Secondary | ICD-10-CM | POA: Diagnosis not present

## 2019-02-01 DIAGNOSIS — G4089 Other seizures: Secondary | ICD-10-CM | POA: Diagnosis not present

## 2019-02-01 DIAGNOSIS — R609 Edema, unspecified: Secondary | ICD-10-CM | POA: Diagnosis not present

## 2019-02-01 DIAGNOSIS — E785 Hyperlipidemia, unspecified: Secondary | ICD-10-CM | POA: Diagnosis not present

## 2019-02-01 DIAGNOSIS — Z7401 Bed confinement status: Secondary | ICD-10-CM | POA: Diagnosis not present

## 2019-02-01 DIAGNOSIS — H8110 Benign paroxysmal vertigo, unspecified ear: Secondary | ICD-10-CM | POA: Diagnosis not present

## 2019-02-01 DIAGNOSIS — I469 Cardiac arrest, cause unspecified: Secondary | ICD-10-CM | POA: Diagnosis not present

## 2019-02-01 DIAGNOSIS — R52 Pain, unspecified: Secondary | ICD-10-CM | POA: Diagnosis not present

## 2019-02-01 NOTE — Progress Notes (Signed)
Attempted to call [facility] to give report. Office closed. No other number listed.

## 2019-02-01 NOTE — Discharge Summary (Addendum)
Physician Discharge Summary  Danielle Bryan [id number] DOB: 02/19/1924 DOA: 01/26/2019  PCP: Velna Hatchet, MD  Admit date: 01/26/2019 Discharge date: 02/01/2019  Admitted From: Home Disposition: SNF  Recommendations for Outpatient Follow-up:  1. Follow up with PCP in 1-2 weeks 2. Follow up with Stroke Clinic NP at [facility] on 03/01/2019 3. Will need 30-day monitoring in outpatient setting to rule out atrial fibrillation 4. Continue dual antiplatelet therapy and convert back to monotherapy with Clopidogrel 75 mg p.o. daily 5. Please obtain CMP/CBC, Mag, Phos in one week 6. Please follow up on the following pending results:  Home Health: No  Equipment/Devices: Rolling walker with 5" wheels(may need short RW)   Discharge Condition: Stable  CODE STATUS: FULL CODE  Diet recommendation: Heart Healthy Diet   Brief/Interim Summary: The patient is an 85 year old Caucasian elderly female with a past medical history significant for but not limited to CVA and TIA on Plavix, hypertension, hyperlipidemia, hypothyroidism, osteoarthritis, situational depression, and other comorbidities who presented with a chief complaint of dizziness and nausea. She woke up yesterday morning at 12 to around 8 AM and was trying to sit up in the bed started feeling dizzy and nauseous. She states she vomited a little bit and has continued to feel dizzy and nauseous and has not been able to ambulate without support. She denies any focal weakness or numbness and denies any headaches changes in vision but describes the dizziness as a room spinning sensation. She stated that is worse every time she moves her head and because she was extremely symptomatic she is brought to the emergency room and she is not able to ambulate. MRI was done was concerning for a unilateral hippocampal restriction diffusion is swelling with no associated hemorrhage or mass-effect these findings are favoring acute ischemia. The ED provider paged and  discussed the case with Dr. Lorraine Lax of neurology who reviewed the images and agrees that there is an acute ischemic change but did not feel that it is the cause of her vertigo. Neurology formally consulted and she was given aspirin 324 mg,Zofran and a 500 mL bolus of normal saline  Further work-up revealed she had some BPPVas Dix-Hallpike Maneuver waspositive as well as in incidental R Hippocampal CVA likely 2/2 to Intracranial Atherosclerosis. PT worked with her and they recommend skilled nursing facility. Neurology recommended for precautionary for her stroke and recommended dual antiplatelet therapy for 3 weeks and then just back to monotherapy with clopidogrel.She was stable to be discharged to skilled nursing facility on 01/30/2019 as patient's family has picked her facility however insurance authorization was not obtained until today and the facility required a repeat COVID test which has been ordered and Negative Again. She is stable to D/C to SNF Today.   Discharge Diagnoses:  Principal Problem:   CVA (cerebral vascular accident) ([place]) Active Problems:   HTN (hypertension)   Vertigo  AcuteRightHippocampal CVA Vertigo from BPPV -Patient is presenting with complaints of dizziness and nausea but neurology does not feel like the CVA is related to her dizziness -Symptom onset 12/2 around 8 AM. -She has a history of prior CVA/TIA on Plavix. -MRI concerning for unilateral right hippocampal restriction diffusion and swelling. No associated hemorrhage or mass-effect. Findings favor acute ischemia. -ED provider discussed the case with Dr. Merrily Pew neurology who reviewed the patient's imaging and agrees that this is an acute ischemic change but does not feel it is the cause of her vertigo. BPPV is also on the differential for vertigo. -C/wTelemetry monitoringto evaluate  for possible atrial fibrillation -Allow for permissive hypertension-treat only if systolic blood pressures are  greater than 220. -CT Head and Neck done and showed "No acute intracranial hemorrhage. Recent right hippocampal infarction is better seen on MRI. Stable chronic findings detailed above. Significant intracranial atherosclerosis primarily involving medium and smaller branch vessels. No hemodynamically significant stenosis in the neck." -TransThoracic 2DEchocardiogramordered and to be done; EEG also being done by Neurology along with LEVenous Duplex -Hemoglobin A1c was 5.7 -Lipid Panel as below -ReceivedAspirin 324 mg daily: Neurology also recommending starting high intensitystatin but ? Intolerance so will need to discuss and Start Statin in AM -Frequent neurochecks -PT, OT, speech therapy.PT to do Vestibular Testing; Had a Positive Dix-Hallpike Maneuver and will need SNF for Rehab  -C/w Fall Precautions  -N.p.o. until cleared by bedside swallow evaluation or formal speech evaluation and had no needs  -Neurology will see the patient and recommending DAPT with ASA and Plavix for 3 weeks and then Plavix daily -EEG showed no Seizure  -ContinueMeclizine12.5 mg po Dailyprn -Antiemeticwith Zofran 4 mg IV q6hprn Nauesa/Vomitingas needed -Patient passed bedside swallow screen so we will start on a heart healthy diet -May need a Loop Recorder per Neuro but after review of her Imaging they feel she has very stenosis'd intracranial vessels and for now they are recommending a 30-day monitor rule out atrial fibrillation in outpatient setting -Dizziness is improving -Had a lower extremity duplex done and showed no evidence of DVT -ECHOCARDIOGRAM showed "Left ventricular ejection fraction, by visual estimation, is 60 to 65%. The left ventricle has normal function. There is no left ventricular hypertrophy. 2. Left ventricular diastolic parameters are consistent with Grade I diastolic dysfunction (impaired relaxation)." -ResumePitavaStatin at D/C and increase dose to 4 mg weekly per  Neurology -Will not repeat labs as her labs were essentially normal; Repeat Blood work within 1-2 weeks -REPEAT COVID TEST Again as required by Facility and was Negative.    Hypertension -Allow Permissive Hypertension at this time for systolic less than [id number] and gradually normalizing 5 to 7 days with long-term blood pressure goal normotensive -BP this AM was 157/70  Hx of SeizureDisorder -Continue Divalproex 500 mg p.o. daily andNeurology checkingan EEG -EEG as below   Hypothyroidism -Check TSH and was 16.178 but free T4 was 1.08 -Continue withLevothyroxine 100 mcg -Follow up TFT's within 4-6 Weeks  Hyperlipidemia -LipidPanel done and showed a total cholesterol/HDL ratio of 3.3, cholesterol level was 280, HDL was 86, LDL was 179, triglycerides were 74, and VLDL15 -Takes Pitavastatin at home weekly and Neurology recommending to increase dose to 4 mg Weekly at D/C  Hyperglycemia -In the setting of Pre-Diabetes -Blood Sugars have been ranging from 106-199 -HbA1c was 5.7 -If necessary will place on Sensitive Novolog SSI  Discharge Instructions Discharge Instructions    Call MD for:  difficulty breathing, headache or visual disturbances   Complete by: As directed    Call MD for:  extreme fatigue   Complete by: As directed    Call MD for:  hives   Complete by: As directed    Call MD for:  persistant dizziness or light-headedness   Complete by: As directed    Call MD for:  persistant nausea and vomiting   Complete by: As directed    Call MD for:  redness, tenderness, or signs of infection (pain, swelling, redness, odor or green/yellow discharge around incision site)   Complete by: As directed    Call MD for:  severe uncontrolled pain   Complete  by: As directed    Call MD for:  temperature >100.4   Complete by: As directed    Diet - low sodium heart healthy   Complete by: As directed    Discharge instructions   Complete by: As directed    You were cared for  by a hospitalist during your hospital stay. If you have any questions about your discharge medications or the care you received while you were in the hospital after you are discharged, you can call the unit and ask to speak with the hospitalist on call if the hospitalist that took care of you is not available. Once you are discharged, your primary care physician will handle any further medical issues. Please note that NO REFILLS for any discharge medications will be authorized once you are discharged, as it is imperative that you return to your primary care physician (or establish a relationship with a primary care physician if you do not have one) for your aftercare needs so that they can reassess your need for medications and monitor your lab values.  Follow up with PCP within 1 week and with Neurology within 4 weeks. Take all medications as prescribed. If symptoms change or worsen please return to the ED for evaluation   Increase activity slowly   Complete by: As directed      Allergies as of 02/01/2019      Reactions   Escitalopram Oxalate Nausea Only   Lisinopril Other (See Comments)   Very decreased blood pressure    Statins    myalgias   Sulfa Antibiotics    hives      Medication List    TAKE these medications   acetaminophen 325 MG tablet Commonly known as: TYLENOL Take 325 mg by mouth every 6 (six) hours as needed for mild pain.   amLODipine 5 MG tablet Commonly known as: NORVASC Take 5 mg by mouth daily.   aspirin 81 MG EC tablet Take 1 tablet (81 mg total) by mouth daily for 21 days.   Calcium 600/Vitamin D3 600-800 MG-UNIT Tabs Generic drug: Calcium Carb-Cholecalciferol Take 1 tablet by mouth 2 (two) times daily.   clopidogrel 75 MG tablet Commonly known as: PLAVIX Take 1 tablet (75 mg total) by mouth daily.   divalproex 500 MG 24 hr tablet Commonly known as: DEPAKOTE ER Take 1 tablet (500 mg total) by mouth daily.   levothyroxine 100 MCG tablet Commonly known  as: SYNTHROID TAKE 1 TABLET (100 MCG TOTAL) BY MOUTH DAILY. TAKING 6 DAYS A WEEK. DON'T TAKE ON SUNDAYS What changed: See the new instructions.   Livalo 2 MG Tabs Generic drug: Pitavastatin Calcium Take 2 tablets (4 mg total) by mouth once a week. What changed:   how much to take  additional instructions   meclizine 12.5 MG tablet Commonly known as: ANTIVERT Take 1 tablet (12.5 mg total) by mouth 3 (three) times daily as needed for dizziness.   multivitamin with minerals Tabs tablet Take 1 tablet by mouth daily.   pantoprazole 40 MG tablet Commonly known as: PROTONIX Take 40 mg by mouth daily.   sertraline 25 MG tablet Commonly known as: ZOLOFT Take 25 mg by mouth daily.      Follow-up Information    [facility]. Go on 03/01/2019.   Specialty: Neurology Contact information: [place] [phone]         Allergies  Allergen Reactions  . Escitalopram Oxalate Nausea Only  . Lisinopril Other (  See Comments)    Very decreased blood pressure   . Statins     myalgias  . Sulfa Antibiotics     hives   Consultations:  Neurology  Procedures/Studies: Ct Angio Head W Or Wo Contrast  Result Date: 01/27/2019 CLINICAL DATA:  Stroke, follow-up EXAM: CT ANGIOGRAPHY HEAD AND NECK TECHNIQUE: Multidetector CT imaging of the head and neck was performed using the standard protocol before and during bolus administration of intravenous contrast. Multiplanar CT image reconstructions and MIPs were obtained to evaluate the vascular anatomy. Carotid stenosis measurements (when applicable) are obtained utilizing NASCET criteria, using the distal internal carotid diameter as the denominator. CONTRAST:  91mL OMNIPAQUE IOHEXOL 350 MG/ML SOLN COMPARISON:  Prior CT and MR imaging FINDINGS: CT HEAD FINDINGS Brain: Ill-defined hypoattenuation is present along the right hippocampus corresponding to abnormal signal on prior MRI.  There is no acute intracranial hemorrhage. Small area of chronic infarction is again identified involving the lateral right frontoparietal lobes. Additional patchy and confluent areas of hypoattenuation in the supratentorial white matter likely reflect stable chronic microvascular ischemic changes. Prominence of the ventricles and sulci reflects stable parenchymal volume loss. Vascular: No hyperdense vessel. There is intracranial atherosclerotic calcification at the skull base. Skull: Unremarkable. Sinuses: Aerated. Orbits: No acute finding. Review of the MIP images confirms the above findings CTA NECK FINDINGS Aortic arch: Great vessel origins are patent. Right carotid system: Common carotid is patent. Primarily calcified plaque is present at the bifurcation causing minimal stenosis. Internal and external carotid arteries are patent. No hemodynamically significant stenosis or evidence of dissection. Left carotid system: Common carotid is patent. Primarily calcified plaque is present at the bifurcation causing mild, less than 50% stenosis. Internal and external carotid arteries are patent. No hemodynamically significant stenosis or evidence of dissection. Vertebral arteries: Patent. Right vertebral artery is dominant. No significant stenosis or evidence of dissection. Skeleton: Degenerative changes of the included spine. Other neck: Multinodular thyroid. Upper chest: No apical lung mass. Review of the MIP images confirms the above findings CTA HEAD FINDINGS Anterior circulation: Intracranial internal carotid arteries are patent with calcified plaque but no significant stenosis. Anterior and middle cerebral arteries are patent. Multifocal stenoses are present of a M2 MCA, A2 ACA, and more distal branches bilaterally. Several of these are severe. Posterior circulation: Intracranial right vertebral artery is patent. The intracranial left vertebral artery appears to be chronically occluded beyond the PICA origin.  Basilar artery is patent. Posterior cerebral arteries are patent. There is a right posterior communicating artery present. There is mild stenosis of the basilar artery. There is irregularity with mild to moderate stenoses of the posterior cerebral arteries. There is a more distal high-grade stenosis of an occipital branch on the right. Venous sinuses: Patent as permitted by contrast timing. Review of the MIP images confirms the above findings IMPRESSION: No acute intracranial hemorrhage. Recent right hippocampal infarction is better seen on MRI. Stable chronic findings detailed above. Significant intracranial atherosclerosis primarily involving medium and smaller branch vessels. No hemodynamically significant stenosis in the neck. Electronically Signed   By: Macy Mis M.D.   On: 01/27/2019 09:41   Ct Angio Neck W Or Wo Contrast  Result Date: 01/27/2019 CLINICAL DATA:  Stroke, follow-up EXAM: CT ANGIOGRAPHY HEAD AND NECK TECHNIQUE: Multidetector CT imaging of the head and neck was performed using the standard protocol before and during bolus administration of intravenous contrast. Multiplanar CT image reconstructions and MIPs were obtained to evaluate the vascular anatomy. Carotid stenosis measurements (when applicable) are  obtained utilizing NASCET criteria, using the distal internal carotid diameter as the denominator. CONTRAST:  27mL OMNIPAQUE IOHEXOL 350 MG/ML SOLN COMPARISON:  Prior CT and MR imaging FINDINGS: CT HEAD FINDINGS Brain: Ill-defined hypoattenuation is present along the right hippocampus corresponding to abnormal signal on prior MRI. There is no acute intracranial hemorrhage. Small area of chronic infarction is again identified involving the lateral right frontoparietal lobes. Additional patchy and confluent areas of hypoattenuation in the supratentorial white matter likely reflect stable chronic microvascular ischemic changes. Prominence of the ventricles and sulci reflects stable  parenchymal volume loss. Vascular: No hyperdense vessel. There is intracranial atherosclerotic calcification at the skull base. Skull: Unremarkable. Sinuses: Aerated. Orbits: No acute finding. Review of the MIP images confirms the above findings CTA NECK FINDINGS Aortic arch: Great vessel origins are patent. Right carotid system: Common carotid is patent. Primarily calcified plaque is present at the bifurcation causing minimal stenosis. Internal and external carotid arteries are patent. No hemodynamically significant stenosis or evidence of dissection. Left carotid system: Common carotid is patent. Primarily calcified plaque is present at the bifurcation causing mild, less than 50% stenosis. Internal and external carotid arteries are patent. No hemodynamically significant stenosis or evidence of dissection. Vertebral arteries: Patent. Right vertebral artery is dominant. No significant stenosis or evidence of dissection. Skeleton: Degenerative changes of the included spine. Other neck: Multinodular thyroid. Upper chest: No apical lung mass. Review of the MIP images confirms the above findings CTA HEAD FINDINGS Anterior circulation: Intracranial internal carotid arteries are patent with calcified plaque but no significant stenosis. Anterior and middle cerebral arteries are patent. Multifocal stenoses are present of a M2 MCA, A2 ACA, and more distal branches bilaterally. Several of these are severe. Posterior circulation: Intracranial right vertebral artery is patent. The intracranial left vertebral artery appears to be chronically occluded beyond the PICA origin. Basilar artery is patent. Posterior cerebral arteries are patent. There is a right posterior communicating artery present. There is mild stenosis of the basilar artery. There is irregularity with mild to moderate stenoses of the posterior cerebral arteries. There is a more distal high-grade stenosis of an occipital branch on the right. Venous sinuses: Patent  as permitted by contrast timing. Review of the MIP images confirms the above findings IMPRESSION: No acute intracranial hemorrhage. Recent right hippocampal infarction is better seen on MRI. Stable chronic findings detailed above. Significant intracranial atherosclerosis primarily involving medium and smaller branch vessels. No hemodynamically significant stenosis in the neck. Electronically Signed   By: Macy Mis M.D.   On: 01/27/2019 09:41   Mr Brain Wo Contrast  Result Date: 01/27/2019 CLINICAL DATA:  85 year old female with acute onset dizziness 12-24 hours ago. No mention of seizure like activity. EXAM: MRI HEAD WITHOUT CONTRAST TECHNIQUE: Multiplanar, multiecho pulse sequences of the brain and surrounding structures were obtained without intravenous contrast. COMPARISON:  Brain MRI 03/20/2016. FINDINGS: Brain: Confluent restricted diffusion throughout the head and body of the right hippocampus (series 7, image 52). Associated T2 hyperintensity and mild hippocampal swelling on series 17, image 15. No associated hemorrhage or regional mass effect. No other restricted diffusion. No midline shift, mass effect, evidence of mass lesion, ventriculomegaly, extra-axial collection or acute intracranial hemorrhage. Cervicomedullary junction and pituitary are within normal limits. There is patchy chronic encephalomalacia at the right frontal operculum, stable. Patchy and confluent superimposed bilateral cerebral white matter T2 and FLAIR hyperintensity is stable since 2018. Similar patchy T2 signal in the bilateral deep gray nuclei and pons also appears stable. Cerebellum remains negative.  There are new chronic microhemorrhages in the central pons and the left deep cerebellar nuclei since 2018 (series 14, image 20, 13). No other cortical encephalomalacia or chronic cerebral blood products identified. Vascular: Major intracranial vascular flow voids are stable. Skull and upper cervical spine: Normal for age  visible cervical spine. Normal bone marrow signal. Sinuses/Orbits: Stable and negative. Other: Mastoids remain clear. Visible internal auditory structures appear normal. Scalp and face soft tissues appear negative. IMPRESSION: 1. Confluent unilateral right hippocampal restricted diffusion and swelling. No associated hemorrhage or mass effect. In this clinical setting I favor this reflects acute ischemia. 2. Underlying chronic small vessel disease with mild progression since 2018. Electronically Signed   By: Genevie Ann M.D.   On: 01/27/2019 01:29   Vas [place] Lower Extremity Venous (dvt)  Result Date: 01/27/2019  Lower Venous Study Indications: Stroke.  Comparison Study: no prior Performing Technologist: June Leap RDMS, RVT  Examination Guidelines: A complete evaluation includes B-mode imaging, spectral Doppler, color Doppler, and power Doppler as needed of all accessible portions of each vessel. Bilateral testing is considered an integral part of a complete examination. Limited examinations for reoccurring indications may be performed as noted.  +---------+---------------+---------+-----------+----------+--------------+ RIGHT    CompressibilityPhasicitySpontaneityPropertiesThrombus Aging +---------+---------------+---------+-----------+----------+--------------+ CFV      Full           Yes      Yes                                 +---------+---------------+---------+-----------+----------+--------------+ SFJ      Full                                                        +---------+---------------+---------+-----------+----------+--------------+ FV Prox  Full                                                        +---------+---------------+---------+-----------+----------+--------------+ FV Mid   Full                                                        +---------+---------------+---------+-----------+----------+--------------+ FV DistalFull                                                         +---------+---------------+---------+-----------+----------+--------------+ PFV      Full                                                        +---------+---------------+---------+-----------+----------+--------------+ POP      Full           Yes      Yes                                 +---------+---------------+---------+-----------+----------+--------------+  PTV      Full                                                        +---------+---------------+---------+-----------+----------+--------------+ PERO     Full                                                        +---------+---------------+---------+-----------+----------+--------------+   +---------+---------------+---------+-----------+----------+--------------+ LEFT     CompressibilityPhasicitySpontaneityPropertiesThrombus Aging +---------+---------------+---------+-----------+----------+--------------+ CFV      Full           Yes      Yes                                 +---------+---------------+---------+-----------+----------+--------------+ SFJ      Full                                                        +---------+---------------+---------+-----------+----------+--------------+ FV Prox  Full                                                        +---------+---------------+---------+-----------+----------+--------------+ FV Mid   Full                                                        +---------+---------------+---------+-----------+----------+--------------+ FV DistalFull                                                        +---------+---------------+---------+-----------+----------+--------------+ PFV      Full                                                        +---------+---------------+---------+-----------+----------+--------------+ POP      Full           Yes      Yes                                  +---------+---------------+---------+-----------+----------+--------------+ PTV      Full                                                        +---------+---------------+---------+-----------+----------+--------------+  PERO     Full                                                        +---------+---------------+---------+-----------+----------+--------------+     Summary: Right: There is no evidence of deep vein thrombosis in the lower extremity. No cystic structure found in the popliteal fossa. Left: There is no evidence of deep vein thrombosis in the lower extremity. No cystic structure found in the popliteal fossa.  *See table(s) above for measurements and observations. Electronically signed by Servando Snare MD on 01/27/2019 at 6:11:20 PM.    Final    ECHOCARDIOGRAM IMPRESSIONS   1. Left ventricular ejection fraction, by visual estimation, is 60 to 65%. The left ventricle has normal function. There is no left ventricular hypertrophy. 2. Left ventricular diastolic parameters are consistent with Grade I diastolic dysfunction (impaired relaxation). 3. Global right ventricle has normal systolic function.The right ventricular size is normal. No increase in right ventricular wall thickness. 4. Left atrial size was normal. 5. Right atrial size was normal. 6. Mild mitral annular calcification. 7. The mitral valve is normal in structure. No evidence of mitral valve regurgitation. No evidence of mitral stenosis. 8. The tricuspid valve is normal in structure. Tricuspid valve regurgitation is mild. 9. The aortic valve is tricuspid. Aortic valve regurgitation is not visualized. Mild aortic valve sclerosis without stenosis. 10. The pulmonic valve was normal in structure. Pulmonic valve regurgitation is not visualized. 11. Normal pulmonary artery systolic pressure. 12. The inferior vena cava is normal in size with greater than 50% respiratory variability, suggesting right atrial  pressure of 3 mmHg.  FINDINGS Left Ventricle: Left ventricular ejection fraction, by visual estimation, is 60 to 65%. The left ventricle has normal function. There is no left ventricular hypertrophy. Left ventricular diastolic parameters are consistent with Grade I diastolic  dysfunction (impaired relaxation). Normal left atrial pressure.  Right Ventricle: The right ventricular size is normal. No increase in right ventricular wall thickness. Global RV systolic function is has normal systolic function. The tricuspid regurgitant velocity is 2.34 m/s, and with an assumed right atrial pressure of 3 mmHg, the estimated right ventricular systolic pressure is normal at 24.9 mmHg.  Left Atrium: Left atrial size was normal in size.  Right Atrium: Right atrial size was normal in size  Pericardium: There is no evidence of pericardial effusion.  Mitral Valve: The mitral valve is normal in structure. Mild mitral annular calcification. No evidence of mitral valve stenosis by observation. No evidence of mitral valve regurgitation.  Tricuspid Valve: The tricuspid valve is normal in structure. Tricuspid valve regurgitation is mild.  Aortic Valve: The aortic valve is tricuspid. Aortic valve regurgitation is not visualized. Mild aortic valve sclerosis is present, with no evidence of aortic valve stenosis.  Pulmonic Valve: The pulmonic valve was normal in structure. Pulmonic valve regurgitation is not visualized.  Aorta: The aortic root, ascending aorta and aortic arch are all structurally normal, with no evidence of dilitation or obstruction.  Venous: The inferior vena cava is normal in size with greater than 50% respiratory variability, suggesting right atrial pressure of 3 mmHg.  IAS/Shunts: No atrial level shunt detected by color flow Doppler. There is no evidence of a patent foramen ovale. No ventricular septal defect is seen or detected. There is no evidence of an atrial septal  defect.   LEFT VENTRICLE PLAX 2D LVIDd: 3.10 cm Diastology LVIDs: 2.20 cm LV e' lateral: 9.90 cm/s LV PW: 0.90 cm LV E/e' lateral: 9.1 LV IVS: 0.90 cm LV e' medial: 6.20 cm/s LVOT diam: 1.50 cm LV E/e' medial: 14.6 LV SV: 22 ml LV SV Index: 13.24 LVOT Area: 1.77 cm   RIGHT VENTRICLE RV S prime: 16.60 cm/s TAPSE (M-mode): 2.0 cm  LEFT ATRIUM Index RIGHT ATRIUM Index LA diam: 3.20 cm 2.00 cm/m RA Area: 15.60 cm LA Vol (A2C): 39.3 ml 24.56 ml/m RA Volume: 44.30 ml 27.68 ml/m LA Vol (A4C): 28.4 ml 17.75 ml/m LA Biplane Vol: 33.8 ml 21.12 ml/m AORTIC VALVE LVOT Vmax: 132.00 cm/s LVOT Vmean: 91.300 cm/s LVOT VTI: 0.265 m  AORTA Ao Root diam: 2.80 cm  MITRAL VALVE TRICUSPID VALVE MV Area (PHT): 2.99 cm TR Peak grad: 21.9 mmHg MV PHT: 73.66 msec TR Vmax: 234.00 cm/s MV Decel Time: 254 msec MV E velocity: 90.30 cm/s 103 cm/s SHUNTS MV A velocity: 117.00 cm/s 70.3 cm/s Systemic VTI: 0.26 m MV E/A ratio: 0.77 1.5 Systemic Diam: 1.50 cm  LE VENOUS DUPLEX Summary: Right: There is no evidence of deep vein thrombosis in the lower extremity. No cystic structure found in the popliteal fossa. Left: There is no evidence of deep vein thrombosis in the lower extremity. No cystic structure found in the popliteal fossa  EEG DESCRIPTION:The posterior dominant rhythm consists of8Hz  activity of moderate voltage (25-35 uV) seen predominantly in posterior head regions, symmetric and reactive to eye opening and eye closing.Drowsiness was characterized by attenuation of the posterior background rhythm.EEG showed intermittent 2-3Hz  left temporal delta slowing. Hyperventilation and photic stimulation were not performed.  ABNORMALITY - Intermittent slow, left  temporal  IMPRESSION: This study issuggestive of non specific cortical dysfunction in left temporal region.No seizures or epileptiform discharges were seen throughout the recording.  Subjective: Seen and examined at bedside and she was still feeling sleepy but no dizziness or nausea or vomiting.  No complaints or issue overnight. No other concerns or complaints at this time.  Discharge Exam: Vitals:   02/01/19 0400 02/01/19 0801  BP: (!) 135/52 (!) 165/62  Pulse: 72 74  Resp: 18 17  Temp: 98.6 F (37 C) 98.3 F (36.8 C)  SpO2: 96% 99%   Vitals:   01/31/19 2000 02/01/19 0000 02/01/19 0400 02/01/19 0801  BP: (!) 157/73 (!) 137/49 (!) 135/52 (!) 165/62  Pulse: 85 73 72 74  Resp: 18 18 18 17   Temp: 98.6 F (37 C) 98.4 F (36.9 C) 98.6 F (37 C) 98.3 F (36.8 C)  TempSrc: Oral Oral Oral Oral  SpO2: 99% 98% 96% 99%   Examination: Physical Exam:  Constitutional: Thin elderly Caucasian female in NAD and appears calm and comfortable Respiratory: Mildly diminished to auscultation bilaterally, no wheezing, rales, rhonchi or crackles. Normal respiratory effort and patient is not tachypenic. No accessory muscle use.  Cardiovascular: RRR, no murmurs / rubs / gallops. S1 and S2 auscultated. No extremity edema.  Abdomen: Soft, non-tender, non-distended. Bowel sounds positive. .  Skin: No rashes, lesions, ulcers on a limited skin. No induration; Warm and dry.  Neurologic: CN 2-12 grossly intact with no focal deficits. Romberg sign and cerebellar reflexes not assessed  The results of significant diagnostics from this hospitalization (including imaging, microbiology, ancillary and laboratory) are listed below for reference.    Microbiology: Recent Results (from the past 240 hour(s))  SARS CORONAVIRUS 2 (TAT 6-24 HRS) Nasopharyngeal Nasopharyngeal Swab  Status: None   Collection Time: 01/26/19  9:08 PM   Specimen: Nasopharyngeal Swab  Result Value Ref Range Status   SARS  Coronavirus 2 NEGATIVE NEGATIVE Final    Comment: (NOTE) SARS-CoV-2 target nucleic acids are NOT DETECTED. The SARS-CoV-2 RNA is generally detectable in upper and lower respiratory specimens during the acute phase of infection. Negative results do not preclude SARS-CoV-2 infection, do not rule out co-infections with other pathogens, and should not be used as the sole basis for treatment or other patient management decisions. Negative results must be combined with clinical observations, patient history, and epidemiological information. The expected result is Negative. Fact Sheet for Patients: SugarRoll.be Fact Sheet for Healthcare Providers: https://www.woods-mathews.com/ This test is not yet approved or cleared by the [place] FDA and  has been authorized for detection and/or diagnosis of SARS-CoV-2 by FDA under an Emergency Use Authorization (EUA). This EUA will remain  in effect (meaning this test can be used) for the duration of the COVID-19 declaration under Section 56 4(b)(1) of the Act, 21 U.S.C. section 360bbb-3(b)(1), unless the authorization is terminated or revoked sooner. Performed at [facility], [place] [address] [place], [place] [address]   SARS CORONAVIRUS 2 (TAT 6-24 HRS) Nasopharyngeal Nasopharyngeal Swab     Status: None   Collection Time: 01/28/19  6:04 PM   Specimen: Nasopharyngeal Swab  Result Value Ref Range Status   SARS Coronavirus 2 NEGATIVE NEGATIVE Final    Comment: (NOTE) SARS-CoV-2 target nucleic acids are NOT DETECTED. The SARS-CoV-2 RNA is generally detectable in upper and lower respiratory specimens during the acute phase of infection. Negative results do not preclude SARS-CoV-2 infection, do not rule out co-infections with other pathogens, and should not be used as the sole basis for treatment or other patient management decisions. Negative results must be combined with clinical  observations, patient history, and epidemiological information. The expected result is Negative. Fact Sheet for Patients: SugarRoll.be Fact Sheet for Healthcare Providers: https://www.woods-mathews.com/ This test is not yet approved or cleared by the [place] FDA and  has been authorized for detection and/or diagnosis of SARS-CoV-2 by FDA under an Emergency Use Authorization (EUA). This EUA will remain  in effect (meaning this test can be used) for the duration of the COVID-19 declaration under Section 56 4(b)(1) of the Act, 21 U.S.C. section 360bbb-3(b)(1), unless the authorization is terminated or revoked sooner. Performed at [facility], [place] [address], [place], [place] [address]   SARS CORONAVIRUS 2 (TAT 6-24 HRS) Nasopharyngeal Nasopharyngeal Swab     Status: None   Collection Time: 01/31/19 10:35 AM   Specimen: Nasopharyngeal Swab  Result Value Ref Range Status   SARS Coronavirus 2 NEGATIVE NEGATIVE Final    Comment: (NOTE) SARS-CoV-2 target nucleic acids are NOT DETECTED. The SARS-CoV-2 RNA is generally detectable in upper and lower respiratory specimens during the acute phase of infection. Negative results do not preclude SARS-CoV-2 infection, do not rule out co-infections with other pathogens, and should not be used as the sole basis for treatment or other patient management decisions. Negative results must be combined with clinical observations, patient history, and epidemiological information. The expected result is Negative. Fact Sheet for Patients: SugarRoll.be Fact Sheet for Healthcare Providers: https://www.woods-mathews.com/ This test is not yet approved or cleared by the [place] FDA and  has been authorized for detection and/or diagnosis of SARS-CoV-2 by FDA under an Emergency Use Authorization (EUA). This EUA will remain  in effect (meaning this test can be  used)  for the duration of the COVID-19 declaration under Section 56 4(b)(1) of the Act, 21 U.S.C. section 360bbb-3(b)(1), unless the authorization is terminated or revoked sooner. Performed at [facility], [place] [address], [place], [place] [address]     Labs: BNP (last 3 results) No results for input(s): BNP in the last 8760 hours. Basic Metabolic Panel: Recent Labs  Lab 01/26/19 2009 01/26/19 2046 01/27/19 1000 01/28/19 0234  NA 139 136 138 139  K 3.6 3.9 3.5 3.5  CL 100  --  102 104  CO2 24  --  24 27  GLUCOSE 125*  --  199* 106*  BUN 13  --  8 9  CREATININE 0.59  --  0.68 0.74  CALCIUM 10.0  --  9.2 9.2  MG  --   --  1.8 1.7  PHOS  --   --  2.4* 3.3   Liver Function Tests: Recent Labs  Lab 01/27/19 1000 01/28/19 0234  AST 24 20  ALT 12 8  ALKPHOS 50 44  BILITOT 0.7 0.8  PROT 6.7 5.8*  ALBUMIN 3.9 3.3*   No results for input(s): LIPASE, AMYLASE in the last 168 hours. No results for input(s): AMMONIA in the last 168 hours. CBC: Recent Labs  Lab 01/26/19 2009 01/26/19 2046 01/27/19 1000 01/28/19 0234  WBC 7.4  --  8.0 6.2  NEUTROABS 4.3  --  5.3 2.7  HGB 13.5 12.9 13.5 12.3  HCT 41.5 38.0 42.0 37.4  MCV 93.3  --  94.0 91.9  PLT 158  --  163 151   Cardiac Enzymes: No results for input(s): CKTOTAL, CKMB, CKMBINDEX, TROPONINI in the last 168 hours. BNP: Invalid input(s): POCBNP CBG: No results for input(s): GLUCAP in the last 168 hours. D-Dimer No results for input(s): DDIMER in the last 72 hours. Hgb A1c No results for input(s): HGBA1C in the last 72 hours. Lipid Profile No results for input(s): CHOL, HDL, LDLCALC, TRIG, CHOLHDL, LDLDIRECT in the last 72 hours. Thyroid function studies No results for input(s): TSH, T4TOTAL, T3FREE, THYROIDAB in the last 72 hours.  Invalid input(s): FREET3 Anemia work up No results for input(s): VITAMINB12, FOLATE, FERRITIN, TIBC, IRON, RETICCTPCT in the last 72 hours. Urinalysis    Component Value Date/Time    COLORURINE STRAW (A) 01/26/2019 2250   APPEARANCEUR CLEAR 01/26/2019 2250   LABSPEC 1.006 01/26/2019 2250   PHURINE 7.0 01/26/2019 2250   GLUCOSEU NEGATIVE 01/26/2019 2250   HGBUR NEGATIVE 01/26/2019 2250   BILIRUBINUR NEGATIVE 01/26/2019 2250   KETONESUR 20 (A) 01/26/2019 2250   PROTEINUR NEGATIVE 01/26/2019 2250   UROBILINOGEN 0.2 03/21/2014 1209   NITRITE NEGATIVE 01/26/2019 2250   LEUKOCYTESUR NEGATIVE 01/26/2019 2250   Sepsis Labs Invalid input(s): PROCALCITONIN,  WBC,  LACTICIDVEN Microbiology Recent Results (from the past 240 hour(s))  SARS CORONAVIRUS 2 (TAT 6-24 HRS) Nasopharyngeal Nasopharyngeal Swab     Status: None   Collection Time: 01/26/19  9:08 PM   Specimen: Nasopharyngeal Swab  Result Value Ref Range Status   SARS Coronavirus 2 NEGATIVE NEGATIVE Final    Comment: (NOTE) SARS-CoV-2 target nucleic acids are NOT DETECTED. The SARS-CoV-2 RNA is generally detectable in upper and lower respiratory specimens during the acute phase of infection. Negative results do not preclude SARS-CoV-2 infection, do not rule out co-infections with other pathogens, and should not be used as the sole basis for treatment or other patient management decisions. Negative results must be combined with clinical observations, patient history, and epidemiological information.  The expected result is Negative. Fact Sheet for Patients: SugarRoll.be Fact Sheet for Healthcare Providers: https://www.woods-mathews.com/ This test is not yet approved or cleared by the [place] FDA and  has been authorized for detection and/or diagnosis of SARS-CoV-2 by FDA under an Emergency Use Authorization (EUA). This EUA will remain  in effect (meaning this test can be used) for the duration of the COVID-19 declaration under Section 56 4(b)(1) of the Act, 21 U.S.C. section 360bbb-3(b)(1), unless the authorization is terminated or revoked sooner. Performed at  [facility], [place] [address] [place], [place] [address]   SARS CORONAVIRUS 2 (TAT 6-24 HRS) Nasopharyngeal Nasopharyngeal Swab     Status: None   Collection Time: 01/28/19  6:04 PM   Specimen: Nasopharyngeal Swab  Result Value Ref Range Status   SARS Coronavirus 2 NEGATIVE NEGATIVE Final    Comment: (NOTE) SARS-CoV-2 target nucleic acids are NOT DETECTED. The SARS-CoV-2 RNA is generally detectable in upper and lower respiratory specimens during the acute phase of infection. Negative results do not preclude SARS-CoV-2 infection, do not rule out co-infections with other pathogens, and should not be used as the sole basis for treatment or other patient management decisions. Negative results must be combined with clinical observations, patient history, and epidemiological information. The expected result is Negative. Fact Sheet for Patients: SugarRoll.be Fact Sheet for Healthcare Providers: https://www.woods-mathews.com/ This test is not yet approved or cleared by the [place] FDA and  has been authorized for detection and/or diagnosis of SARS-CoV-2 by FDA under an Emergency Use Authorization (EUA). This EUA will remain  in effect (meaning this test can be used) for the duration of the COVID-19 declaration under Section 56 4(b)(1) of the Act, 21 U.S.C. section 360bbb-3(b)(1), unless the authorization is terminated or revoked sooner. Performed at [facility], [place] [address] [place], [place] [address]   SARS CORONAVIRUS 2 (TAT 6-24 HRS) Nasopharyngeal Nasopharyngeal Swab     Status: None   Collection Time: 01/31/19 10:35 AM   Specimen: Nasopharyngeal Swab  Result Value Ref Range Status   SARS Coronavirus 2 NEGATIVE NEGATIVE Final    Comment: (NOTE) SARS-CoV-2 target nucleic acids are NOT DETECTED. The SARS-CoV-2 RNA is generally detectable in upper and lower respiratory specimens during the acute phase of infection.  Negative results do not preclude SARS-CoV-2 infection, do not rule out co-infections with other pathogens, and should not be used as the sole basis for treatment or other patient management decisions. Negative results must be combined with clinical observations, patient history, and epidemiological information. The expected result is Negative. Fact Sheet for Patients: SugarRoll.be Fact Sheet for Healthcare Providers: https://www.woods-mathews.com/ This test is not yet approved or cleared by the [place] FDA and  has been authorized for detection and/or diagnosis of SARS-CoV-2 by FDA under an Emergency Use Authorization (EUA). This EUA will remain  in effect (meaning this test can be used) for the duration of the COVID-19 declaration under Section 56 4(b)(1) of the Act, 21 U.S.C. section 360bbb-3(b)(1), unless the authorization is terminated or revoked sooner. Performed at [facility], [place] [address], [place], [place] [address]    Time coordinating discharge: 35 minutes  SIGNED:  Kerney Elbe, DO [facility] 02/01/2019, 8:02 AM Pager is on [place]  If 7PM-7AM, please contact night-coverage [email or web address] Password [email or web address]

## 2019-02-01 NOTE — Progress Notes (Signed)
Patient being discharged to [facility]. All belongings with patient. IV removed. CCMD notified. Leaving unit via PTAR.

## 2019-02-01 NOTE — Progress Notes (Signed)
Occupational Therapy Treatment Patient Details Name: Danielle Bryan MRN: [id number] DOB: 08/20/1923 Today's Date: 02/01/2019    History of present illness 85 y.o. 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  Patient states she woke up yesterday morning 12/2 around 8 AM and as she was trying to sit up in the bed started feeling dizzy and nauseous.  She vomited a little at that time.  Since yesterday she has continued to feel dizzy and nauseous   OT comments  Pt agreeable to OT session this date. Pt completed grooming at sink level with minguard for stability and minguard-minA for functional mobility at RW level during ADL completion. Pt required increased time for grooming at sink level but tolerated standing for 48min. She required vc for assistance to navigate around furniture in the room. Pt emotional following brief phone conversation with her son. Pt repeatedly stating "thank you so much for helping me". Pt will continue to benefit from skilled OT services to maximize safety and independence with ADL/IADL and functional mobility. Will continue to follow acutely and progress as tolerated.    Follow Up Recommendations  SNF    Equipment Recommendations  None recommended by OT    Recommendations for Other Services      Precautions / Restrictions Precautions Precautions: Fall Restrictions Weight Bearing Restrictions: No       Mobility Bed Mobility Overal bed mobility: Needs Assistance Bed Mobility: Supine to Sit     Supine to sit: Min assist     General bed mobility comments: minA to progress trunk to upright position secondary to pain in shoulders  Transfers Overall transfer level: Needs assistance Equipment used: Rolling walker (2 wheeled) Transfers: Sit to/from Stand Sit to Stand: Min assist         General transfer comment: minA to powerup into  standing    Balance Overall balance assessment: Needs assistance Sitting-balance support: Feet supported;Bilateral upper extremity supported;No upper extremity supported Sitting balance-Leahy Scale: Fair     Standing balance support: Bilateral upper extremity supported;During functional activity Standing balance-Leahy Scale: Poor Standing balance comment: Relies on BUE support in standing                           ADL either performed or assessed with clinical judgement   ADL Overall ADL's : Needs assistance/impaired     Grooming: Wash/dry hands;Wash/dry face;Brushing hair;Min guard;Standing Grooming Details (indicate cue type and reason): sink level                 Toilet Transfer: Ambulation;RW;Minimal assistance Armed forces technical officer Details (indicate cue type and reason): minAfor safety and navigating around bed to get to recliner Toileting- Clothing Manipulation and Hygiene: Minimal assistance;Sit to/from stand       Functional mobility during ADLs: Minimal assistance;Rolling walker;Cueing for safety General ADL Comments: no dizziness reported this session     Vision   Vision Assessment?: No apparent visual deficits   Perception     Praxis      Cognition Arousal/Alertness: Awake/alert Behavior During Therapy: WFL for tasks assessed/performed Overall Cognitive Status: Within Functional Limits for tasks assessed                                 General Comments: pt very tearful following phone conversation with son, reports it is her other son's birthday today  and she is missing her family        Exercises     Shoulder Instructions       General Comments vss    Pertinent Vitals/ Pain       Pain Assessment: Faces Faces Pain Scale: Hurts a little bit Pain Location: bilateral knees and shoulders due to arthritis  Pain Descriptors / Indicators: Discomfort;Guarding Pain Intervention(s): Limited activity within patient's  tolerance;Monitored during session  Home Living                                          Prior Functioning/Environment              Frequency  Min 2X/week        Progress Toward Goals  OT Goals(current goals can now be found in the care plan section)  Progress towards OT goals: Progressing toward goals  Acute Rehab OT Goals Patient Stated Goal: to talk to her son on his birthday OT Goal Formulation: With patient Time For Goal Achievement: 02/11/19 Potential to Achieve Goals: Good ADL Goals Pt Will Perform Grooming: with supervision Pt Will Perform Upper Body Bathing: with modified independence Pt Will Perform Lower Body Bathing: with modified independence Pt Will Perform Upper Body Dressing: with modified independence Pt Will Perform Lower Body Dressing: with modified independence Pt Will Transfer to Toilet: with supervision Pt Will Perform Toileting - Clothing Manipulation and hygiene: with supervision  Plan Discharge plan remains appropriate    Co-evaluation                 AM-PAC OT "6 Clicks" Daily Activity     Outcome Measure   Help from another person eating meals?: A Little Help from another person taking care of personal grooming?: A Little Help from another person toileting, which includes using toliet, bedpan, or urinal?: A Little Help from another person bathing (including washing, rinsing, drying)?: A Little Help from another person to put on and taking off regular upper body clothing?: A Little Help from another person to put on and taking off regular lower body clothing?: A Little 6 Click Score: 18    End of Session Equipment Utilized During Treatment: Gait belt;Rolling walker  OT Visit Diagnosis: Unsteadiness on feet (R26.81);Muscle weakness (generalized) (M62.81)   Activity Tolerance Patient tolerated treatment well   Patient Left in chair;with chair alarm set;with call bell/phone within reach   Nurse Communication  Mobility status        Time: [id number] OT Time Calculation (min): 43 min  Charges: OT General Charges $OT Visit: 1 Visit OT Treatments $Self Care/Home Management : 38-52 mins  Dorinda Hill OTR/L Acute Rehabilitation Services Office: [place] 02/01/2019, 12:51 PM

## 2019-02-01 NOTE — TOC Progression Note (Signed)
Transition of Care [facility]) - Progression Note    Patient Details  Name: Danielle Bryan MRN: [id number] Date of Birth: [birth year]  Transition of Care [facility] CM/SW [place], [place] Phone Number: 02/01/2019, 10:53 AM  Clinical Narrative:   CSW following for discharge plan. CSW reached out to Soy at [facility] to ask about admission today, ask for update on auth status. Soy indicated that she had not started authorization yet, thought that CSW would do authorization request. CSW confirmed with [employer] that patient's insurance was managed through [place], and told Soy that she needed to do the [place] request. CSW updated MD about barriers to discharge.     Expected Discharge Plan: Skilled Nursing Facility Barriers to Discharge: Insurance Authorization  Expected Discharge Plan and Services Expected Discharge Plan: [place] In-house Referral: Clinical Social Work Discharge Planning Services: CM Consult Post Acute Care Choice: [place] Living arrangements for the past 2 months: Single Family Home Expected Discharge Date: 01/30/19                                     Social Determinants of Health (SDOH) Interventions    Readmission Risk Interventions No flowsheet data found.

## 2019-02-01 NOTE — TOC Transition Note (Cosign Needed)
Transition of Care [facility]) - CM/SW Discharge Note   Patient Details  Name: HAMSIKA MILAN MRN: [id number] Date of Birth: 22-Nov-1923  Transition of Care [facility]) CM/SW Contact:  Kirstie Peri, [place] Work Phone Number: 02/01/2019, 4:02 PM   Clinical Narrative:     Nurse to call report to [phone] Room 405b.  Final next level of care: Skilled Nursing Facility Barriers to Discharge: Barriers Resolved   Patient Goals and CMS Choice   CMS Medicare.gov Compare Post Acute Care list provided to:: Patient Represenative (must comment) Choice offered to / list presented to : Patient, Adult Children  Discharge Placement              Patient chooses bed at: [facility] Patient to be transferred to facility by: [place] Name of family member notified: [name] Patient and family notified of of transfer: 02/01/19  Discharge Plan and Services In-house Referral: Clinical Social Work Discharge Planning Services: CM Consult Post Acute Care Choice: [place]                               Social Determinants of Health (SDOH) Interventions     Readmission Risk Interventions No flowsheet data found.

## 2019-02-08 DIAGNOSIS — H8110 Benign paroxysmal vertigo, unspecified ear: Secondary | ICD-10-CM | POA: Diagnosis not present

## 2019-02-08 DIAGNOSIS — I10 Essential (primary) hypertension: Secondary | ICD-10-CM | POA: Diagnosis not present

## 2019-02-08 DIAGNOSIS — I639 Cerebral infarction, unspecified: Secondary | ICD-10-CM | POA: Diagnosis not present

## 2019-02-08 DIAGNOSIS — R609 Edema, unspecified: Secondary | ICD-10-CM | POA: Diagnosis not present

## 2019-02-23 DIAGNOSIS — I10 Essential (primary) hypertension: Secondary | ICD-10-CM | POA: Diagnosis not present

## 2019-02-23 DIAGNOSIS — R42 Dizziness and giddiness: Secondary | ICD-10-CM | POA: Diagnosis not present

## 2019-02-23 DIAGNOSIS — M19011 Primary osteoarthritis, right shoulder: Secondary | ICD-10-CM | POA: Diagnosis not present

## 2019-02-23 DIAGNOSIS — M170 Bilateral primary osteoarthritis of knee: Secondary | ICD-10-CM | POA: Diagnosis not present

## 2019-02-23 DIAGNOSIS — F4321 Adjustment disorder with depressed mood: Secondary | ICD-10-CM | POA: Diagnosis not present

## 2019-02-23 DIAGNOSIS — I69398 Other sequelae of cerebral infarction: Secondary | ICD-10-CM | POA: Diagnosis not present

## 2019-02-23 DIAGNOSIS — G4089 Other seizures: Secondary | ICD-10-CM | POA: Diagnosis not present

## 2019-02-23 DIAGNOSIS — M19012 Primary osteoarthritis, left shoulder: Secondary | ICD-10-CM | POA: Diagnosis not present

## 2019-02-23 DIAGNOSIS — E039 Hypothyroidism, unspecified: Secondary | ICD-10-CM | POA: Diagnosis not present

## 2019-02-24 ENCOUNTER — Ambulatory Visit: Payer: Medicare HMO | Admitting: Adult Health

## 2019-02-28 ENCOUNTER — Other Ambulatory Visit: Payer: Self-pay

## 2019-02-28 DIAGNOSIS — R42 Dizziness and giddiness: Secondary | ICD-10-CM | POA: Diagnosis not present

## 2019-02-28 DIAGNOSIS — M19012 Primary osteoarthritis, left shoulder: Secondary | ICD-10-CM | POA: Diagnosis not present

## 2019-02-28 DIAGNOSIS — E039 Hypothyroidism, unspecified: Secondary | ICD-10-CM | POA: Diagnosis not present

## 2019-02-28 DIAGNOSIS — M19011 Primary osteoarthritis, right shoulder: Secondary | ICD-10-CM | POA: Diagnosis not present

## 2019-02-28 DIAGNOSIS — I10 Essential (primary) hypertension: Secondary | ICD-10-CM | POA: Diagnosis not present

## 2019-02-28 DIAGNOSIS — G4089 Other seizures: Secondary | ICD-10-CM | POA: Diagnosis not present

## 2019-02-28 DIAGNOSIS — M170 Bilateral primary osteoarthritis of knee: Secondary | ICD-10-CM | POA: Diagnosis not present

## 2019-02-28 DIAGNOSIS — F4321 Adjustment disorder with depressed mood: Secondary | ICD-10-CM | POA: Diagnosis not present

## 2019-02-28 DIAGNOSIS — I69398 Other sequelae of cerebral infarction: Secondary | ICD-10-CM | POA: Diagnosis not present

## 2019-02-28 NOTE — Patient Outreach (Signed)
[place] [facility] [facility]) Care Management  02/28/2019   Danielle Bryan Nov 08, 1923 [id number]  Initial Assessment  Transition of Care Referral  Referral Date:02/28/2019  Referral Source: [employer] Discharge Report Date of Admission: 02/01/2019 Diagnosis: 'CVA" Date of Discharge: 02/17/2019 Facility: [place]    Outreach attempt #1 to patient. Spoke with patient who denies any acute issue or concerns at present.  Social: Patient resides in her home alone. She has supportive son who works during the day but checks in on her. Patient voices that she has paid caregivers that comes in shifts to assist her. However, she states that they were "recently exposed to COVID-19" and are currently having to quarantine so she has no assistance. She voices that she is managing and getting by okay right now. [place] services ordered and hs contacted patient. Patient states HHPT coming out later today. She relies on her son tot take her to appts.    Conditions: Per chart review, patient has PMH that includes but not limited to HTN,HLD, hypothyroidism,OA and depression. Patient was hospitalized from 01/26/2019-02/01/2019 following  CVA and then went to rehab center for PT. She reports that she is recovering and making slow progress. She is ambulating with use of walker. Last fall was last year. Patient reports intermittent periods and bouts of pain to shoulder and knee area. She states that she goes about every three months to have injections done and went last about a month or so ago. Appetite remains good. Patient self reported weight of 122 lbs.  Appointments: Patient has not made PCP follow up appt yet but plans to do so.She states that she missed her AWV and flu shot appt as she was in the hospital and plans to get vaccine during appt.    Medications: Med review completed with patient. She reports that her son assists her and "has a list" of meds that she is taking. Patient  able to read off meds to [facility]. She denies any issues managing and/or affording meds at this time.   Current Medications:  Current Outpatient Medications  Medication Sig Dispense Refill  . acetaminophen (TYLENOL) 325 MG tablet Take 325 mg by mouth every 6 (six) hours as needed for mild pain.     Marland Kitchen amLODipine (NORVASC) 5 MG tablet Take 5 mg by mouth daily.    . Calcium Carb-Cholecalciferol (CALCIUM 600/VITAMIN D3) 600-800 MG-UNIT TABS Take 1 tablet by mouth 2 (two) times daily.     . clopidogrel (PLAVIX) 75 MG tablet Take 1 tablet (75 mg total) by mouth daily. 30 tablet 3  . levothyroxine (SYNTHROID, LEVOTHROID) 100 MCG tablet TAKE 1 TABLET (100 MCG TOTAL) BY MOUTH DAILY. TAKING 6 DAYS A WEEK. DON'T TAKE ON SUNDAYS (Patient taking differently: Take 100 mcg by mouth See admin instructions. Taking Daily except on Sunday,) 90 tablet 0  . Multiple Vitamin (MULTIVITAMIN WITH MINERALS) TABS tablet Take 1 tablet by mouth daily.    . pantoprazole (PROTONIX) 40 MG tablet Take 40 mg by mouth daily.    . Pitavastatin Calcium (LIVALO) 2 MG TABS Take 2 tablets (4 mg total) by mouth once a week. 10 tablet 3  . sertraline (ZOLOFT) 25 MG tablet Take 25 mg by mouth daily.    . divalproex (DEPAKOTE ER) 500 MG 24 hr tablet Take 1 tablet (500 mg total) by mouth daily. (Patient not taking: Reported on 02/28/2019) 90 tablet 3  . meclizine (ANTIVERT) 12.5 MG tablet Take 1 tablet (12.5 mg total) by mouth  3 (three) times daily as needed for dizziness. (Patient not taking: Reported on 02/28/2019) 30 tablet 0   No current facility-administered medications for this visit.    Functional Status:  In your present state of health, do you have any difficulty performing the following activities: 02/28/2019 01/30/2019  Hearing? Y Y  Comment per pt report-HOH -  Vision? N N  Difficulty concentrating or making decisions? N N  Walking or climbing stairs? Y Y  Comment uses walker dueto dizziness /nausea  Dressing or bathing? Y N   Comment requires assistance -  Doing errands, shopping? Tempie Donning  Comment requires assistance -  Conservation officer, nature and eating ? Y -  Comment requires assistance -  Using the Toilet? N -  In the past six months, have you accidently leaked urine? N -  Do you have problems with loss of bowel control? N -  Managing your Medications? Y -  Comment son helps with meds -  Managing your Finances? N -  Housekeeping or managing your Housekeeping? Y -  Comment has paid caregivers in the home to assist -  Some recent data might be hidden    Fall/Depression Screening: Fall Risk  02/28/2019 02/11/2018  Falls in the past year? 1 0  Number falls in past yr: 1 -  Injury with Fall? 1 -  Risk for fall due to : History of fall(s);Impaired balance/gait;Impaired mobility;Medication side effect;Impaired vision -  Follow up Falls evaluation completed;Falls prevention discussed;Education provided -   PHQ 2/9 Scores 02/28/2019  PHQ - 2 Score 0    Assessment:  THN CM Care Plan Problem One     Most Recent Value  Care Plan Problem One  Patient at risk for readmission to hospital.  Role Documenting the Problem One  Care Management [place] for Problem One  Active  [facility] Long Term Goal   Patient will have no readmission to hospital over the next 31 days.  THN Long Term Goal Start Date  02/28/19  Interventions for Problem One Long Term Goal  RN CM assessed for any acute issues/concerns. RN CM confirmed patient knows who,when,why and how to contact MD.RN CM educated pt. on action plan and s/s of worsening condition.   THN CM Short Term Goal #1   Patient will complete all MD follow up appts over the next 30 days.  THN CM Short Term Goal #1 Start Date  02/28/19  Interventions for Short Term Goal #1  RNCM discussed with pt. importance of MD follow up.RN CM assessed for any barriers to getting to appt.  THN CM Short Term Goal #2   Patient will be able to verbalize at least 2-3 s/s of stroke over the  next 30 days.  THN CM Short Term Goal #2 Start Date  02/28/19  Interventions for Short Term Goal #2  RNCM educated pt on s/s os stroke and when to seek medical attention.     THN CM Care Plan Problem Two     Most Recent Value  Care Plan Problem Two  Patient at risk for falls due to deconditioning and weakness.  Role Documenting the Problem Two  Care Management Telephonic [place] for Problem Two  Active  Interventions for Problem Two Long Term Goal   RNCM confirmed services in place. RN CM completed fall/safety eval and educated pt on safety measures.  THN Long Term Goal  Patient will report actively participating and completing HHPT over the next 60 days.  THN Long Term Goal Start Date  02/28/19  [facility] CM Short Term Goal #1   Patient will report no falls in the home over the next 30 days.  THN CM Short Term Goal #1 Start Date  02/28/19  Interventions for Short Term Goal #2   RN CM completed fall/safety eval. RN CM confirmed proper DME in the home for use. RN CM educated pt on fall/safety measures in the home.       Plan:   RN CM discussed with patient next outreach within a week. Patient gave verbal consent and in agreement with RN CM follow up and timeframe. Patient aware that they may contact RN CM sooner for any issues or concerns. RN CM will send welcome letter to patient. RN CM will send barriers letter and route encounter to PCP.  Enzo Montgomery, RN,BSN,CCM [place] Management Telephonic Care Management Coordinator Direct Phone: [phone] Toll Free: [phone] Fax: [phone]

## 2019-03-01 ENCOUNTER — Other Ambulatory Visit: Payer: Self-pay | Admitting: Adult Health

## 2019-03-01 ENCOUNTER — Ambulatory Visit: Payer: Medicare HMO | Admitting: Adult Health

## 2019-03-01 DIAGNOSIS — M170 Bilateral primary osteoarthritis of knee: Secondary | ICD-10-CM | POA: Diagnosis not present

## 2019-03-01 DIAGNOSIS — M19012 Primary osteoarthritis, left shoulder: Secondary | ICD-10-CM | POA: Diagnosis not present

## 2019-03-01 DIAGNOSIS — G4089 Other seizures: Secondary | ICD-10-CM | POA: Diagnosis not present

## 2019-03-01 DIAGNOSIS — R42 Dizziness and giddiness: Secondary | ICD-10-CM | POA: Diagnosis not present

## 2019-03-01 DIAGNOSIS — E039 Hypothyroidism, unspecified: Secondary | ICD-10-CM | POA: Diagnosis not present

## 2019-03-01 DIAGNOSIS — F4321 Adjustment disorder with depressed mood: Secondary | ICD-10-CM | POA: Diagnosis not present

## 2019-03-01 DIAGNOSIS — I69398 Other sequelae of cerebral infarction: Secondary | ICD-10-CM | POA: Diagnosis not present

## 2019-03-01 DIAGNOSIS — I10 Essential (primary) hypertension: Secondary | ICD-10-CM | POA: Diagnosis not present

## 2019-03-01 DIAGNOSIS — M19011 Primary osteoarthritis, right shoulder: Secondary | ICD-10-CM | POA: Diagnosis not present

## 2019-03-02 DIAGNOSIS — E785 Hyperlipidemia, unspecified: Secondary | ICD-10-CM | POA: Diagnosis not present

## 2019-03-02 DIAGNOSIS — H8110 Benign paroxysmal vertigo, unspecified ear: Secondary | ICD-10-CM | POA: Diagnosis not present

## 2019-03-02 DIAGNOSIS — I119 Hypertensive heart disease without heart failure: Secondary | ICD-10-CM | POA: Diagnosis not present

## 2019-03-02 DIAGNOSIS — R7309 Other abnormal glucose: Secondary | ICD-10-CM | POA: Diagnosis not present

## 2019-03-02 DIAGNOSIS — R279 Unspecified lack of coordination: Secondary | ICD-10-CM | POA: Diagnosis not present

## 2019-03-02 DIAGNOSIS — I639 Cerebral infarction, unspecified: Secondary | ICD-10-CM | POA: Diagnosis not present

## 2019-03-02 DIAGNOSIS — E039 Hypothyroidism, unspecified: Secondary | ICD-10-CM | POA: Diagnosis not present

## 2019-03-03 ENCOUNTER — Telehealth: Payer: Self-pay | Admitting: Adult Health

## 2019-03-03 DIAGNOSIS — M19011 Primary osteoarthritis, right shoulder: Secondary | ICD-10-CM | POA: Diagnosis not present

## 2019-03-03 DIAGNOSIS — I69398 Other sequelae of cerebral infarction: Secondary | ICD-10-CM | POA: Diagnosis not present

## 2019-03-03 DIAGNOSIS — E039 Hypothyroidism, unspecified: Secondary | ICD-10-CM | POA: Diagnosis not present

## 2019-03-03 DIAGNOSIS — M170 Bilateral primary osteoarthritis of knee: Secondary | ICD-10-CM | POA: Diagnosis not present

## 2019-03-03 DIAGNOSIS — M19012 Primary osteoarthritis, left shoulder: Secondary | ICD-10-CM | POA: Diagnosis not present

## 2019-03-03 DIAGNOSIS — I10 Essential (primary) hypertension: Secondary | ICD-10-CM | POA: Diagnosis not present

## 2019-03-03 DIAGNOSIS — G4089 Other seizures: Secondary | ICD-10-CM | POA: Diagnosis not present

## 2019-03-03 DIAGNOSIS — R42 Dizziness and giddiness: Secondary | ICD-10-CM | POA: Diagnosis not present

## 2019-03-03 DIAGNOSIS — F4321 Adjustment disorder with depressed mood: Secondary | ICD-10-CM | POA: Diagnosis not present

## 2019-03-03 NOTE — Telephone Encounter (Signed)
Called pt to r/s her 1/5 appt, pt states someone will call the office today to schedule for her.

## 2019-03-07 ENCOUNTER — Other Ambulatory Visit: Payer: Self-pay

## 2019-03-07 NOTE — Patient Outreach (Signed)
[facility]) Care Management  03/07/2019  Danielle Bryan June 02, 1923 [id number]   Transition of Care Week #2    Outreach attempt to patient. Spoke briefly with patient. She reports that her aide just arrived to her home to assist her with getting bathed. She denies any acute issues or concerns at present. She states she is unsure about her MD appts as her son has been handling that for her. She denies any RN CM needs or concerns at this time.   [facility] CM Care Plan Problem One     Most Recent Value  Care Plan Problem One  Patient at risk for readmission to hospital.  Role Documenting the Problem One  Care Management [place] for Problem One  Active  [facility] Long Term Goal   Patient will have no readmission to hospital over the next 31 days.  THN Long Term Goal Start Date  02/28/19  Interventions for Problem One Long Term Goal  RN CM reviewed s/s of worsening condition and when to seek medical attention. RN CM confirmed pt knows how to contact MD if needed.  THN CM Short Term Goal #1   Patient will complete all MD follow up appts over the next 30 days.  THN CM Short Term Goal #1 Start Date  02/28/19  Interventions for Short Term Goal #1  RN CM assessed if appts have been made.  THN CM Short Term Goal #2   Patient will be able to verbalize at least 2-3 s/s of stroke over the next 30 days.  THN CM Short Term Goal #2 Start Date  02/28/19    [facility] CM Care Plan Problem Two     Most Recent Value  Care Plan Problem Two  Patient at risk for falls due to deconditioning and weakness.  Role Documenting the Problem Two  Care Management Telephonic [place] for Problem Two  Active  Interventions for Problem Two Long Term Goal   RN CM assessed for any safety issues. RNCM reinforced fall/safety measures in the home.  THN Long Term Goal  Patient will report actively participating and completing HHPT over the next 60 days.  THN Long Term Goal Start Date   02/28/19  [facility] CM Short Term Goal #1   Patient will report no falls in the home over the next 30 days.  THN CM Short Term Goal #1 Start Date  02/28/19  Interventions for Short Term Goal #2   RN CM assessed for any falls and confirmed pt has adequate assistance in the home.      Plan: RN CM discussed with patient next outreach within a week. Patient gave verbal consent and in agreement with RN CM follow up and timeframe. Patient aware that they may contact RN CM sooner for any issues or concerns.   Enzo Montgomery, RN,BSN,CCM [place] Management Telephonic Care Management Coordinator Direct Phone: [phone] Toll Free: [phone] Fax: [phone]

## 2019-03-08 DIAGNOSIS — R42 Dizziness and giddiness: Secondary | ICD-10-CM | POA: Diagnosis not present

## 2019-03-08 DIAGNOSIS — G4089 Other seizures: Secondary | ICD-10-CM | POA: Diagnosis not present

## 2019-03-08 DIAGNOSIS — E039 Hypothyroidism, unspecified: Secondary | ICD-10-CM | POA: Diagnosis not present

## 2019-03-08 DIAGNOSIS — I10 Essential (primary) hypertension: Secondary | ICD-10-CM | POA: Diagnosis not present

## 2019-03-08 DIAGNOSIS — M170 Bilateral primary osteoarthritis of knee: Secondary | ICD-10-CM | POA: Diagnosis not present

## 2019-03-08 DIAGNOSIS — F4321 Adjustment disorder with depressed mood: Secondary | ICD-10-CM | POA: Diagnosis not present

## 2019-03-08 DIAGNOSIS — M19012 Primary osteoarthritis, left shoulder: Secondary | ICD-10-CM | POA: Diagnosis not present

## 2019-03-08 DIAGNOSIS — M19011 Primary osteoarthritis, right shoulder: Secondary | ICD-10-CM | POA: Diagnosis not present

## 2019-03-08 DIAGNOSIS — I69398 Other sequelae of cerebral infarction: Secondary | ICD-10-CM | POA: Diagnosis not present

## 2019-03-09 DIAGNOSIS — M19012 Primary osteoarthritis, left shoulder: Secondary | ICD-10-CM | POA: Diagnosis not present

## 2019-03-09 DIAGNOSIS — G4089 Other seizures: Secondary | ICD-10-CM | POA: Diagnosis not present

## 2019-03-09 DIAGNOSIS — I10 Essential (primary) hypertension: Secondary | ICD-10-CM | POA: Diagnosis not present

## 2019-03-09 DIAGNOSIS — I69398 Other sequelae of cerebral infarction: Secondary | ICD-10-CM | POA: Diagnosis not present

## 2019-03-09 DIAGNOSIS — F4321 Adjustment disorder with depressed mood: Secondary | ICD-10-CM | POA: Diagnosis not present

## 2019-03-09 DIAGNOSIS — M170 Bilateral primary osteoarthritis of knee: Secondary | ICD-10-CM | POA: Diagnosis not present

## 2019-03-09 DIAGNOSIS — R42 Dizziness and giddiness: Secondary | ICD-10-CM | POA: Diagnosis not present

## 2019-03-09 DIAGNOSIS — M19011 Primary osteoarthritis, right shoulder: Secondary | ICD-10-CM | POA: Diagnosis not present

## 2019-03-09 DIAGNOSIS — E039 Hypothyroidism, unspecified: Secondary | ICD-10-CM | POA: Diagnosis not present

## 2019-03-10 DIAGNOSIS — I69398 Other sequelae of cerebral infarction: Secondary | ICD-10-CM | POA: Diagnosis not present

## 2019-03-10 DIAGNOSIS — E039 Hypothyroidism, unspecified: Secondary | ICD-10-CM | POA: Diagnosis not present

## 2019-03-10 DIAGNOSIS — M19011 Primary osteoarthritis, right shoulder: Secondary | ICD-10-CM | POA: Diagnosis not present

## 2019-03-10 DIAGNOSIS — G4089 Other seizures: Secondary | ICD-10-CM | POA: Diagnosis not present

## 2019-03-10 DIAGNOSIS — I10 Essential (primary) hypertension: Secondary | ICD-10-CM | POA: Diagnosis not present

## 2019-03-10 DIAGNOSIS — R42 Dizziness and giddiness: Secondary | ICD-10-CM | POA: Diagnosis not present

## 2019-03-10 DIAGNOSIS — M19012 Primary osteoarthritis, left shoulder: Secondary | ICD-10-CM | POA: Diagnosis not present

## 2019-03-10 DIAGNOSIS — F4321 Adjustment disorder with depressed mood: Secondary | ICD-10-CM | POA: Diagnosis not present

## 2019-03-10 DIAGNOSIS — M170 Bilateral primary osteoarthritis of knee: Secondary | ICD-10-CM | POA: Diagnosis not present

## 2019-03-11 DIAGNOSIS — G4089 Other seizures: Secondary | ICD-10-CM | POA: Diagnosis not present

## 2019-03-11 DIAGNOSIS — M170 Bilateral primary osteoarthritis of knee: Secondary | ICD-10-CM | POA: Diagnosis not present

## 2019-03-11 DIAGNOSIS — M19011 Primary osteoarthritis, right shoulder: Secondary | ICD-10-CM | POA: Diagnosis not present

## 2019-03-11 DIAGNOSIS — I10 Essential (primary) hypertension: Secondary | ICD-10-CM | POA: Diagnosis not present

## 2019-03-11 DIAGNOSIS — E039 Hypothyroidism, unspecified: Secondary | ICD-10-CM | POA: Diagnosis not present

## 2019-03-11 DIAGNOSIS — R42 Dizziness and giddiness: Secondary | ICD-10-CM | POA: Diagnosis not present

## 2019-03-11 DIAGNOSIS — F4321 Adjustment disorder with depressed mood: Secondary | ICD-10-CM | POA: Diagnosis not present

## 2019-03-11 DIAGNOSIS — I69398 Other sequelae of cerebral infarction: Secondary | ICD-10-CM | POA: Diagnosis not present

## 2019-03-11 DIAGNOSIS — M19012 Primary osteoarthritis, left shoulder: Secondary | ICD-10-CM | POA: Diagnosis not present

## 2019-03-14 ENCOUNTER — Ambulatory Visit: Payer: Self-pay

## 2019-03-15 DIAGNOSIS — M19011 Primary osteoarthritis, right shoulder: Secondary | ICD-10-CM | POA: Diagnosis not present

## 2019-03-15 DIAGNOSIS — M170 Bilateral primary osteoarthritis of knee: Secondary | ICD-10-CM | POA: Diagnosis not present

## 2019-03-15 DIAGNOSIS — M19012 Primary osteoarthritis, left shoulder: Secondary | ICD-10-CM | POA: Diagnosis not present

## 2019-03-15 DIAGNOSIS — E039 Hypothyroidism, unspecified: Secondary | ICD-10-CM | POA: Diagnosis not present

## 2019-03-15 DIAGNOSIS — R42 Dizziness and giddiness: Secondary | ICD-10-CM | POA: Diagnosis not present

## 2019-03-15 DIAGNOSIS — I10 Essential (primary) hypertension: Secondary | ICD-10-CM | POA: Diagnosis not present

## 2019-03-15 DIAGNOSIS — I69398 Other sequelae of cerebral infarction: Secondary | ICD-10-CM | POA: Diagnosis not present

## 2019-03-15 DIAGNOSIS — G4089 Other seizures: Secondary | ICD-10-CM | POA: Diagnosis not present

## 2019-03-15 DIAGNOSIS — F4321 Adjustment disorder with depressed mood: Secondary | ICD-10-CM | POA: Diagnosis not present

## 2019-03-16 ENCOUNTER — Other Ambulatory Visit: Payer: Self-pay

## 2019-03-16 DIAGNOSIS — I69398 Other sequelae of cerebral infarction: Secondary | ICD-10-CM | POA: Diagnosis not present

## 2019-03-16 DIAGNOSIS — G4089 Other seizures: Secondary | ICD-10-CM | POA: Diagnosis not present

## 2019-03-16 DIAGNOSIS — M19012 Primary osteoarthritis, left shoulder: Secondary | ICD-10-CM | POA: Diagnosis not present

## 2019-03-16 DIAGNOSIS — I10 Essential (primary) hypertension: Secondary | ICD-10-CM | POA: Diagnosis not present

## 2019-03-16 DIAGNOSIS — M19011 Primary osteoarthritis, right shoulder: Secondary | ICD-10-CM | POA: Diagnosis not present

## 2019-03-16 DIAGNOSIS — M170 Bilateral primary osteoarthritis of knee: Secondary | ICD-10-CM | POA: Diagnosis not present

## 2019-03-16 DIAGNOSIS — R42 Dizziness and giddiness: Secondary | ICD-10-CM | POA: Diagnosis not present

## 2019-03-16 DIAGNOSIS — E039 Hypothyroidism, unspecified: Secondary | ICD-10-CM | POA: Diagnosis not present

## 2019-03-16 DIAGNOSIS — F4321 Adjustment disorder with depressed mood: Secondary | ICD-10-CM | POA: Diagnosis not present

## 2019-03-16 NOTE — Patient Outreach (Signed)
[place] [facility]) Care Management  03/16/2019  Danielle Bryan June 07, 1923 [id number]   Transition of Care    Outreach attempt #1 to patient. Spoke with patient who states she is doing well. She denies any acute issues or concerns. She continues to report that she has caregivers coming in the home for several hours per day to assist her. She states that she is continuing to work with physical therapist and she feels like she is making a little progress. RN CM inquired about appt and patient states son was to make it but she was not sure. She requested that RN CM contact son to follow up. RN CM Spoke with son-[name]. He states he has not made appt as he was unsure of who to contact. RN CM provided son with info and he will call office and make an appt. No further RN CM needs or concerns at this time.      Plan: RN CM discussed with patient next outreach within a week. Patient gave verbal consent and in agreement with RN CM follow up and timeframe. Patient aware that they may contact RN CM sooner for any issues or concerns.   Enzo Montgomery, RN,BSN,CCM [place] Management Telephonic Care Management Coordinator Direct Phone: [phone] Toll Free: [phone] Fax: [phone]

## 2019-03-18 DIAGNOSIS — R42 Dizziness and giddiness: Secondary | ICD-10-CM | POA: Diagnosis not present

## 2019-03-18 DIAGNOSIS — M19012 Primary osteoarthritis, left shoulder: Secondary | ICD-10-CM | POA: Diagnosis not present

## 2019-03-18 DIAGNOSIS — I10 Essential (primary) hypertension: Secondary | ICD-10-CM | POA: Diagnosis not present

## 2019-03-18 DIAGNOSIS — M19011 Primary osteoarthritis, right shoulder: Secondary | ICD-10-CM | POA: Diagnosis not present

## 2019-03-18 DIAGNOSIS — E039 Hypothyroidism, unspecified: Secondary | ICD-10-CM | POA: Diagnosis not present

## 2019-03-18 DIAGNOSIS — F4321 Adjustment disorder with depressed mood: Secondary | ICD-10-CM | POA: Diagnosis not present

## 2019-03-18 DIAGNOSIS — I69398 Other sequelae of cerebral infarction: Secondary | ICD-10-CM | POA: Diagnosis not present

## 2019-03-18 DIAGNOSIS — G4089 Other seizures: Secondary | ICD-10-CM | POA: Diagnosis not present

## 2019-03-18 DIAGNOSIS — M170 Bilateral primary osteoarthritis of knee: Secondary | ICD-10-CM | POA: Diagnosis not present

## 2019-03-21 ENCOUNTER — Other Ambulatory Visit: Payer: Self-pay

## 2019-03-21 DIAGNOSIS — G4089 Other seizures: Secondary | ICD-10-CM | POA: Diagnosis not present

## 2019-03-21 DIAGNOSIS — M170 Bilateral primary osteoarthritis of knee: Secondary | ICD-10-CM | POA: Diagnosis not present

## 2019-03-21 DIAGNOSIS — E039 Hypothyroidism, unspecified: Secondary | ICD-10-CM | POA: Diagnosis not present

## 2019-03-21 DIAGNOSIS — F4321 Adjustment disorder with depressed mood: Secondary | ICD-10-CM | POA: Diagnosis not present

## 2019-03-21 DIAGNOSIS — M19012 Primary osteoarthritis, left shoulder: Secondary | ICD-10-CM | POA: Diagnosis not present

## 2019-03-21 DIAGNOSIS — I10 Essential (primary) hypertension: Secondary | ICD-10-CM | POA: Diagnosis not present

## 2019-03-21 DIAGNOSIS — M19011 Primary osteoarthritis, right shoulder: Secondary | ICD-10-CM | POA: Diagnosis not present

## 2019-03-21 DIAGNOSIS — I69398 Other sequelae of cerebral infarction: Secondary | ICD-10-CM | POA: Diagnosis not present

## 2019-03-21 DIAGNOSIS — R42 Dizziness and giddiness: Secondary | ICD-10-CM | POA: Diagnosis not present

## 2019-03-21 NOTE — Patient Outreach (Signed)
[facility] Care Management  03/21/2019  Danielle Bryan 1923/11/26 [id number]   Transition of Care   Outreach attempt to patient. Spoke briefly with patient as she reported she was still asleep and in bed. She denies any acute issues or concerns. She shares that her aide will be there shortly to the home to assist her. Her son continues to check on her often as well. She reports that she has had some good days lately. She is unsure if son made appt from previous call with RN CM.She denies any RN CM needs or concerns at this time.      Plan: RN CM discussed with patient next outreach within a month. Patient gave verbal consent and in agreement with RN CM follow up and timeframe. Patient aware that they may contact RN CM sooner for any issues or concerns.  Enzo Montgomery, RN,BSN,CCM [place] Management Telephonic Care Management Coordinator Direct Phone: [phone] Toll Free: [phone] Fax: [phone]

## 2019-03-22 DIAGNOSIS — F4321 Adjustment disorder with depressed mood: Secondary | ICD-10-CM | POA: Diagnosis not present

## 2019-03-22 DIAGNOSIS — M19012 Primary osteoarthritis, left shoulder: Secondary | ICD-10-CM | POA: Diagnosis not present

## 2019-03-22 DIAGNOSIS — I10 Essential (primary) hypertension: Secondary | ICD-10-CM | POA: Diagnosis not present

## 2019-03-22 DIAGNOSIS — M170 Bilateral primary osteoarthritis of knee: Secondary | ICD-10-CM | POA: Diagnosis not present

## 2019-03-22 DIAGNOSIS — M19011 Primary osteoarthritis, right shoulder: Secondary | ICD-10-CM | POA: Diagnosis not present

## 2019-03-22 DIAGNOSIS — R42 Dizziness and giddiness: Secondary | ICD-10-CM | POA: Diagnosis not present

## 2019-03-22 DIAGNOSIS — G4089 Other seizures: Secondary | ICD-10-CM | POA: Diagnosis not present

## 2019-03-22 DIAGNOSIS — E039 Hypothyroidism, unspecified: Secondary | ICD-10-CM | POA: Diagnosis not present

## 2019-03-22 DIAGNOSIS — I69398 Other sequelae of cerebral infarction: Secondary | ICD-10-CM | POA: Diagnosis not present

## 2019-03-24 DIAGNOSIS — M19012 Primary osteoarthritis, left shoulder: Secondary | ICD-10-CM | POA: Diagnosis not present

## 2019-03-24 DIAGNOSIS — R42 Dizziness and giddiness: Secondary | ICD-10-CM | POA: Diagnosis not present

## 2019-03-24 DIAGNOSIS — I69398 Other sequelae of cerebral infarction: Secondary | ICD-10-CM | POA: Diagnosis not present

## 2019-03-24 DIAGNOSIS — M170 Bilateral primary osteoarthritis of knee: Secondary | ICD-10-CM | POA: Diagnosis not present

## 2019-03-24 DIAGNOSIS — M19011 Primary osteoarthritis, right shoulder: Secondary | ICD-10-CM | POA: Diagnosis not present

## 2019-03-24 DIAGNOSIS — E039 Hypothyroidism, unspecified: Secondary | ICD-10-CM | POA: Diagnosis not present

## 2019-03-24 DIAGNOSIS — I10 Essential (primary) hypertension: Secondary | ICD-10-CM | POA: Diagnosis not present

## 2019-03-24 DIAGNOSIS — F4321 Adjustment disorder with depressed mood: Secondary | ICD-10-CM | POA: Diagnosis not present

## 2019-03-24 DIAGNOSIS — G4089 Other seizures: Secondary | ICD-10-CM | POA: Diagnosis not present

## 2019-03-28 DIAGNOSIS — I69398 Other sequelae of cerebral infarction: Secondary | ICD-10-CM | POA: Diagnosis not present

## 2019-03-28 DIAGNOSIS — M19011 Primary osteoarthritis, right shoulder: Secondary | ICD-10-CM | POA: Diagnosis not present

## 2019-03-28 DIAGNOSIS — I10 Essential (primary) hypertension: Secondary | ICD-10-CM | POA: Diagnosis not present

## 2019-03-28 DIAGNOSIS — M170 Bilateral primary osteoarthritis of knee: Secondary | ICD-10-CM | POA: Diagnosis not present

## 2019-03-28 DIAGNOSIS — M19012 Primary osteoarthritis, left shoulder: Secondary | ICD-10-CM | POA: Diagnosis not present

## 2019-03-28 DIAGNOSIS — E039 Hypothyroidism, unspecified: Secondary | ICD-10-CM | POA: Diagnosis not present

## 2019-03-28 DIAGNOSIS — R42 Dizziness and giddiness: Secondary | ICD-10-CM | POA: Diagnosis not present

## 2019-03-28 DIAGNOSIS — F4321 Adjustment disorder with depressed mood: Secondary | ICD-10-CM | POA: Diagnosis not present

## 2019-03-28 DIAGNOSIS — G4089 Other seizures: Secondary | ICD-10-CM | POA: Diagnosis not present

## 2019-03-30 DIAGNOSIS — M19011 Primary osteoarthritis, right shoulder: Secondary | ICD-10-CM | POA: Diagnosis not present

## 2019-03-30 DIAGNOSIS — E039 Hypothyroidism, unspecified: Secondary | ICD-10-CM | POA: Diagnosis not present

## 2019-03-30 DIAGNOSIS — R42 Dizziness and giddiness: Secondary | ICD-10-CM | POA: Diagnosis not present

## 2019-03-30 DIAGNOSIS — M19012 Primary osteoarthritis, left shoulder: Secondary | ICD-10-CM | POA: Diagnosis not present

## 2019-03-30 DIAGNOSIS — F4321 Adjustment disorder with depressed mood: Secondary | ICD-10-CM | POA: Diagnosis not present

## 2019-03-30 DIAGNOSIS — I69398 Other sequelae of cerebral infarction: Secondary | ICD-10-CM | POA: Diagnosis not present

## 2019-03-30 DIAGNOSIS — I10 Essential (primary) hypertension: Secondary | ICD-10-CM | POA: Diagnosis not present

## 2019-03-30 DIAGNOSIS — M170 Bilateral primary osteoarthritis of knee: Secondary | ICD-10-CM | POA: Diagnosis not present

## 2019-03-30 DIAGNOSIS — G4089 Other seizures: Secondary | ICD-10-CM | POA: Diagnosis not present

## 2019-03-31 DIAGNOSIS — R42 Dizziness and giddiness: Secondary | ICD-10-CM | POA: Diagnosis not present

## 2019-03-31 DIAGNOSIS — I69398 Other sequelae of cerebral infarction: Secondary | ICD-10-CM | POA: Diagnosis not present

## 2019-03-31 DIAGNOSIS — E039 Hypothyroidism, unspecified: Secondary | ICD-10-CM | POA: Diagnosis not present

## 2019-03-31 DIAGNOSIS — M19012 Primary osteoarthritis, left shoulder: Secondary | ICD-10-CM | POA: Diagnosis not present

## 2019-03-31 DIAGNOSIS — M19011 Primary osteoarthritis, right shoulder: Secondary | ICD-10-CM | POA: Diagnosis not present

## 2019-03-31 DIAGNOSIS — I10 Essential (primary) hypertension: Secondary | ICD-10-CM | POA: Diagnosis not present

## 2019-03-31 DIAGNOSIS — F4321 Adjustment disorder with depressed mood: Secondary | ICD-10-CM | POA: Diagnosis not present

## 2019-03-31 DIAGNOSIS — M170 Bilateral primary osteoarthritis of knee: Secondary | ICD-10-CM | POA: Diagnosis not present

## 2019-03-31 DIAGNOSIS — G4089 Other seizures: Secondary | ICD-10-CM | POA: Diagnosis not present

## 2019-04-04 DIAGNOSIS — M25511 Pain in right shoulder: Secondary | ICD-10-CM | POA: Diagnosis not present

## 2019-04-04 DIAGNOSIS — M25512 Pain in left shoulder: Secondary | ICD-10-CM | POA: Diagnosis not present

## 2019-04-05 DIAGNOSIS — E039 Hypothyroidism, unspecified: Secondary | ICD-10-CM | POA: Diagnosis not present

## 2019-04-05 DIAGNOSIS — G4089 Other seizures: Secondary | ICD-10-CM | POA: Diagnosis not present

## 2019-04-05 DIAGNOSIS — M19012 Primary osteoarthritis, left shoulder: Secondary | ICD-10-CM | POA: Diagnosis not present

## 2019-04-05 DIAGNOSIS — I10 Essential (primary) hypertension: Secondary | ICD-10-CM | POA: Diagnosis not present

## 2019-04-05 DIAGNOSIS — I69398 Other sequelae of cerebral infarction: Secondary | ICD-10-CM | POA: Diagnosis not present

## 2019-04-05 DIAGNOSIS — M19011 Primary osteoarthritis, right shoulder: Secondary | ICD-10-CM | POA: Diagnosis not present

## 2019-04-05 DIAGNOSIS — M170 Bilateral primary osteoarthritis of knee: Secondary | ICD-10-CM | POA: Diagnosis not present

## 2019-04-05 DIAGNOSIS — R42 Dizziness and giddiness: Secondary | ICD-10-CM | POA: Diagnosis not present

## 2019-04-05 DIAGNOSIS — F4321 Adjustment disorder with depressed mood: Secondary | ICD-10-CM | POA: Diagnosis not present

## 2019-04-06 ENCOUNTER — Other Ambulatory Visit: Payer: Self-pay

## 2019-04-06 DIAGNOSIS — M170 Bilateral primary osteoarthritis of knee: Secondary | ICD-10-CM | POA: Diagnosis not present

## 2019-04-06 DIAGNOSIS — I10 Essential (primary) hypertension: Secondary | ICD-10-CM | POA: Diagnosis not present

## 2019-04-06 DIAGNOSIS — M19011 Primary osteoarthritis, right shoulder: Secondary | ICD-10-CM | POA: Diagnosis not present

## 2019-04-06 DIAGNOSIS — M19012 Primary osteoarthritis, left shoulder: Secondary | ICD-10-CM | POA: Diagnosis not present

## 2019-04-06 DIAGNOSIS — F4321 Adjustment disorder with depressed mood: Secondary | ICD-10-CM | POA: Diagnosis not present

## 2019-04-06 DIAGNOSIS — E039 Hypothyroidism, unspecified: Secondary | ICD-10-CM | POA: Diagnosis not present

## 2019-04-06 DIAGNOSIS — I69398 Other sequelae of cerebral infarction: Secondary | ICD-10-CM | POA: Diagnosis not present

## 2019-04-06 DIAGNOSIS — G4089 Other seizures: Secondary | ICD-10-CM | POA: Diagnosis not present

## 2019-04-06 DIAGNOSIS — R42 Dizziness and giddiness: Secondary | ICD-10-CM | POA: Diagnosis not present

## 2019-04-06 NOTE — Patient Outreach (Signed)
[place] [facility]) Care Management  04/06/2019  GERLINE RATNER [birth year] [id number]   Telephone Assessment    Outreach attempt #1 to patient. Spoke with patient's adie who reported patient was resting as she did not sleep well last night. Advised that RN CM would call back at another time.    Plan: RN CM will make outreach attempt to patient within 3-4 business days.   Enzo Montgomery, RN,BSN,CCM [place] Management Telephonic Care Management Coordinator Direct Phone: [phone] Toll Free: [phone] Fax: [phone]

## 2019-04-07 DIAGNOSIS — G4089 Other seizures: Secondary | ICD-10-CM | POA: Diagnosis not present

## 2019-04-07 DIAGNOSIS — M19011 Primary osteoarthritis, right shoulder: Secondary | ICD-10-CM | POA: Diagnosis not present

## 2019-04-07 DIAGNOSIS — I10 Essential (primary) hypertension: Secondary | ICD-10-CM | POA: Diagnosis not present

## 2019-04-07 DIAGNOSIS — E039 Hypothyroidism, unspecified: Secondary | ICD-10-CM | POA: Diagnosis not present

## 2019-04-07 DIAGNOSIS — I69398 Other sequelae of cerebral infarction: Secondary | ICD-10-CM | POA: Diagnosis not present

## 2019-04-07 DIAGNOSIS — M170 Bilateral primary osteoarthritis of knee: Secondary | ICD-10-CM | POA: Diagnosis not present

## 2019-04-07 DIAGNOSIS — R42 Dizziness and giddiness: Secondary | ICD-10-CM | POA: Diagnosis not present

## 2019-04-07 DIAGNOSIS — F4321 Adjustment disorder with depressed mood: Secondary | ICD-10-CM | POA: Diagnosis not present

## 2019-04-07 DIAGNOSIS — M19012 Primary osteoarthritis, left shoulder: Secondary | ICD-10-CM | POA: Diagnosis not present

## 2019-04-08 ENCOUNTER — Other Ambulatory Visit: Payer: Self-pay

## 2019-04-08 NOTE — Patient Outreach (Signed)
[place] [facility]) Care Management  04/08/2019  Danielle Bryan Jul 25, 1923 [id number]   Telephone Assessment   Outreach attempt #2 to patient. Spoke with patient who denies any acute issues or concerns at present. She reports that she continues to recover from hospitalization.patient reports that it "has been a slow progress" this time around recovering. RN CM discussed with pt. multiple factors that may be contributing to this. She continues to work with HHPT and states she feels like she is regaining her strength some. Appetite remains WNL for her. She denies any RN CM needs or concerns at this time.      Plan: RN CM discussed with patient next outreach within the month of March. Patient gave verbal consent and in agreement with RN CM follow up and timeframe. Patient aware that they may contact RN CM sooner for any issues or concerns.   Enzo Montgomery, RN,BSN,CCM [place] Management Telephonic Care Management Coordinator Direct Phone: [phone] Toll Free: [phone] Fax: [phone]

## 2019-04-12 DIAGNOSIS — I10 Essential (primary) hypertension: Secondary | ICD-10-CM | POA: Diagnosis not present

## 2019-04-12 DIAGNOSIS — M170 Bilateral primary osteoarthritis of knee: Secondary | ICD-10-CM | POA: Diagnosis not present

## 2019-04-12 DIAGNOSIS — R42 Dizziness and giddiness: Secondary | ICD-10-CM | POA: Diagnosis not present

## 2019-04-12 DIAGNOSIS — M19011 Primary osteoarthritis, right shoulder: Secondary | ICD-10-CM | POA: Diagnosis not present

## 2019-04-12 DIAGNOSIS — E039 Hypothyroidism, unspecified: Secondary | ICD-10-CM | POA: Diagnosis not present

## 2019-04-12 DIAGNOSIS — F4321 Adjustment disorder with depressed mood: Secondary | ICD-10-CM | POA: Diagnosis not present

## 2019-04-12 DIAGNOSIS — M19012 Primary osteoarthritis, left shoulder: Secondary | ICD-10-CM | POA: Diagnosis not present

## 2019-04-12 DIAGNOSIS — I69398 Other sequelae of cerebral infarction: Secondary | ICD-10-CM | POA: Diagnosis not present

## 2019-04-12 DIAGNOSIS — G4089 Other seizures: Secondary | ICD-10-CM | POA: Diagnosis not present

## 2019-04-13 ENCOUNTER — Telehealth: Payer: Self-pay | Admitting: Adult Health

## 2019-04-13 MED ORDER — DIVALPROEX SODIUM ER 500 MG PO TB24: 500.0000 mg | ORAL_TABLET | Freq: Every day | ORAL | 3 refills | Status: DC

## 2019-04-13 NOTE — Telephone Encounter (Signed)
LMVM for son, [name] to call.

## 2019-04-13 NOTE — Telephone Encounter (Signed)
Spoke to son, [name].  He stated pt had been taking divalproex 500mg  po daily.  Seen in [facility] for stroke in 01/2019.  Was taken to [facility].  At home now with son, since [place] eve. Doing ok,  Using walker now.  Appt rescheduled from tomorrow to 05/2019.  I refilled her divalproex.

## 2019-04-13 NOTE — Telephone Encounter (Signed)
1) Medication(s) Requested (by name): divalproex (DEPAKOTE ER) 500 MG 24 hr tablet   2) Pharmacy of [facility]: [id number] [place], [place]  [id number], [place] [address]   3) Special Requests:   Approved medications will be sent to the pharmacy, we will reach out if there is an issue.  Requests made after 3pm may not be addressed until the following business day!  If a patient is unsure of the name of the medication(s) please note and ask patient to call back when they are able to provide all info, do not send to responsible party until all information is available!

## 2019-04-14 ENCOUNTER — Ambulatory Visit: Payer: Medicare HMO | Admitting: Adult Health

## 2019-04-15 DIAGNOSIS — E039 Hypothyroidism, unspecified: Secondary | ICD-10-CM | POA: Diagnosis not present

## 2019-04-15 DIAGNOSIS — R42 Dizziness and giddiness: Secondary | ICD-10-CM | POA: Diagnosis not present

## 2019-04-15 DIAGNOSIS — I69398 Other sequelae of cerebral infarction: Secondary | ICD-10-CM | POA: Diagnosis not present

## 2019-04-15 DIAGNOSIS — M19012 Primary osteoarthritis, left shoulder: Secondary | ICD-10-CM | POA: Diagnosis not present

## 2019-04-15 DIAGNOSIS — G4089 Other seizures: Secondary | ICD-10-CM | POA: Diagnosis not present

## 2019-04-15 DIAGNOSIS — F4321 Adjustment disorder with depressed mood: Secondary | ICD-10-CM | POA: Diagnosis not present

## 2019-04-15 DIAGNOSIS — M19011 Primary osteoarthritis, right shoulder: Secondary | ICD-10-CM | POA: Diagnosis not present

## 2019-04-15 DIAGNOSIS — I10 Essential (primary) hypertension: Secondary | ICD-10-CM | POA: Diagnosis not present

## 2019-04-15 DIAGNOSIS — M170 Bilateral primary osteoarthritis of knee: Secondary | ICD-10-CM | POA: Diagnosis not present

## 2019-04-19 DIAGNOSIS — M170 Bilateral primary osteoarthritis of knee: Secondary | ICD-10-CM | POA: Diagnosis not present

## 2019-04-19 DIAGNOSIS — I10 Essential (primary) hypertension: Secondary | ICD-10-CM | POA: Diagnosis not present

## 2019-04-19 DIAGNOSIS — G4089 Other seizures: Secondary | ICD-10-CM | POA: Diagnosis not present

## 2019-04-19 DIAGNOSIS — E039 Hypothyroidism, unspecified: Secondary | ICD-10-CM | POA: Diagnosis not present

## 2019-04-19 DIAGNOSIS — M19012 Primary osteoarthritis, left shoulder: Secondary | ICD-10-CM | POA: Diagnosis not present

## 2019-04-19 DIAGNOSIS — F4321 Adjustment disorder with depressed mood: Secondary | ICD-10-CM | POA: Diagnosis not present

## 2019-04-19 DIAGNOSIS — R42 Dizziness and giddiness: Secondary | ICD-10-CM | POA: Diagnosis not present

## 2019-04-19 DIAGNOSIS — I69398 Other sequelae of cerebral infarction: Secondary | ICD-10-CM | POA: Diagnosis not present

## 2019-04-19 DIAGNOSIS — M19011 Primary osteoarthritis, right shoulder: Secondary | ICD-10-CM | POA: Diagnosis not present

## 2019-04-25 DIAGNOSIS — M1711 Unilateral primary osteoarthritis, right knee: Secondary | ICD-10-CM | POA: Diagnosis not present

## 2019-04-25 DIAGNOSIS — M170 Bilateral primary osteoarthritis of knee: Secondary | ICD-10-CM | POA: Diagnosis not present

## 2019-04-25 DIAGNOSIS — M1712 Unilateral primary osteoarthritis, left knee: Secondary | ICD-10-CM | POA: Diagnosis not present

## 2019-05-03 ENCOUNTER — Other Ambulatory Visit: Payer: Self-pay

## 2019-05-03 NOTE — Patient Outreach (Signed)
[facility]) Care Management  05/03/2019  Danielle Bryan May 17, 1923 [id number]   Telephone Assessment   Outreach attempt to patient. Spoke with patient who reports she is doing well. She denies any acute issues or concerns at present. She reports she continues to get good care form her aides that are coming 5x/wk. She states that she went about a week ago to have knee injection shots. She reports she can tell a differ since injections. Appetite remains WNL for patient. Wgt stable at 122lbs. Per EMR patient goes for neuro appt next month. RN CM discussed with patient COVID vaccine. She voices that her and her son have been talking about and she does plan to get it. RN CM provided patient with information on how to schedule an appt and she will share it with her son. She denies any further RN CM needs or concerns at this time.     [facility] CM Care Plan Problem One     Most Recent Value  Care Plan Problem One  Patient at risk for readmission to hospital.  Role Documenting the Problem One  Care Management [place] for Problem One  Active  [facility] Long Term Goal   Patient will have no readmission to hospital over the next 31 days.  THN Long Term Goal Start Date  02/28/19  THN Long Term Goal Met Date  04/08/19  [facility] CM Short Term Goal #1   Patient will complete all MD follow up appts over the next 30 days.  THN CM Short Term Goal #1 Start Date  02/28/19  Interventions for Short Term Goal #1  RN CM assessed for appt-pt still has pending neuro appt for 05/2019    [facility] CM Care Plan Problem Two     Most Recent Value  Care Plan Problem Two  Patient at risk for falls due to deconditioning and weakness.  Role Documenting the Problem Two  Care Management Telephonic Coordinator  Care Plan for Problem Two  Active  THN Long Term Goal  Patient will report actively participating and completing HHPT over the next 60 days.  THN Long Term Goal Start Date  02/28/19  THN Long Term  Goal Met Date  05/03/19    [facility] CM Care Plan Problem Three     Most Recent Value  Care Plan Problem Three  Health maintenance-pt needs COVID vaccine  Role Documenting the Problem Three  Care Management Telephonic Coordinator  Care Plan for Problem Three  Active  THN Long Term Goal   Patient will report obtaining COVID vaccine within the next 90 days.  THN Long Term Goal Start Date  05/03/19  Interventions for Problem Three Long Term Goal  RN CM discussed vaccine risks and benefits with pt. RN CM provided pt. with info/instructions on how to obtain vaccine appt.      Plan: RN CM discussed with patient next outreach within the month of  April. Patient gave verbal consent and in agreement with RN CM follow up and timeframe. Patient aware that they may contact RN CM sooner for any issues or concerns.   Enzo Montgomery, RN,BSN,CCM [place] Management Telephonic Care Management Coordinator Direct Phone: [phone] Toll Free: [phone] Fax: [phone]

## 2019-05-04 ENCOUNTER — Other Ambulatory Visit: Payer: Self-pay

## 2019-05-04 ENCOUNTER — Encounter: Payer: Self-pay | Admitting: Podiatry

## 2019-05-04 ENCOUNTER — Ambulatory Visit: Payer: Medicare HMO | Admitting: Podiatry

## 2019-05-04 DIAGNOSIS — M79674 Pain in right toe(s): Secondary | ICD-10-CM | POA: Diagnosis not present

## 2019-05-04 DIAGNOSIS — D689 Coagulation defect, unspecified: Secondary | ICD-10-CM | POA: Insufficient documentation

## 2019-05-04 DIAGNOSIS — B351 Tinea unguium: Secondary | ICD-10-CM

## 2019-05-04 DIAGNOSIS — M79675 Pain in left toe(s): Secondary | ICD-10-CM

## 2019-05-04 NOTE — Progress Notes (Signed)
This patient returns to my office for at risk foot care.  This patient requires this care by a professional since this patient will be at risk due to having  Thrombocytopenia. Patient is taking plavix  She is brought to the office by her son. This patient is unable to cut nails themselves since the patient cannot reach her  nails.These nails are painful walking and wearing shoes.  This patient presents for at risk foot care today.  General Appearance  Alert, conversant and in no acute stress.  Vascular  Dorsalis pedis pulses are palpable  bilaterally. Posterior tibial pulses are not palpable  B/L. Capillary return is within normal limits  bilaterally. Temperature is within normal limits  bilaterally.  Neurologic  Senn-Weinstein monofilament wire test within normal limits  bilaterally. Muscle power within normal limits bilaterally.  Nails Thick disfigured discolored nails with subungual debris  from hallux to fifth toes bilaterally. No evidence of bacterial infection or drainage bilaterally.  Orthopedic  No limitations of motion  feet .  No crepitus or effusions noted.  No bony pathology or digital deformities noted.HAV 1st MPJ right greater than left.  Hammer toes 2-4  B/L.  Skin  normotropic skin with no porokeratosis noted bilaterally.  No signs of infections or ulcers noted.     Onychomycosis  Pain in right toes  Pain in left toes  Consent was obtained for treatment procedures.   Mechanical debridement of nails 1-5  bilaterally performed with a nail nipper.  Filed with dremel without incident. No infection or ulcer.     Return office visit  3 months         Told patient to return for periodic foot care and evaluation due to potential at risk complications.   Gardiner Barefoot DPM

## 2019-05-09 ENCOUNTER — Ambulatory Visit: Payer: Medicare HMO

## 2019-06-08 ENCOUNTER — Other Ambulatory Visit: Payer: Self-pay

## 2019-06-08 NOTE — Patient Outreach (Signed)
[place] [facility]) Care Management  06/08/2019  ANGLA ZEGARELLI Nov 04, 1923 [id number]   Telephone Assessment   Outreach attempt #1 to patient. RN CM attempted several times and line busy and unable to get through.     Plan: RN CM will make outreach attempt to patient within the month of May.    Enzo Montgomery, RN,BSN,CCM [place] Management Telephonic Care Management Coordinator Direct Phone: [phone] Toll Free: [phone] Fax: [phone]

## 2019-06-22 ENCOUNTER — Other Ambulatory Visit: Payer: Self-pay

## 2019-06-22 ENCOUNTER — Encounter: Payer: Self-pay | Admitting: Adult Health

## 2019-06-22 ENCOUNTER — Ambulatory Visit: Payer: Medicare HMO | Admitting: Adult Health

## 2019-06-22 VITALS — BP 153/78 | HR 96 | Temp 98.4°F | Ht 62.0 in | Wt 120.0 lb

## 2019-06-22 DIAGNOSIS — H8111 Benign paroxysmal vertigo, right ear: Secondary | ICD-10-CM | POA: Diagnosis not present

## 2019-06-22 DIAGNOSIS — E785 Hyperlipidemia, unspecified: Secondary | ICD-10-CM | POA: Diagnosis not present

## 2019-06-22 DIAGNOSIS — Z8673 Personal history of transient ischemic attack (TIA), and cerebral infarction without residual deficits: Secondary | ICD-10-CM | POA: Diagnosis not present

## 2019-06-22 DIAGNOSIS — I10 Essential (primary) hypertension: Secondary | ICD-10-CM

## 2019-06-22 NOTE — Patient Instructions (Signed)
Continue clopidogrel 75 mg daily  and pitavastatin  for secondary stroke prevention  Continue to follow up with PCP regarding cholesterol and blood pressure management   Continue to monitor blood pressure at home  Maintain strict control of hypertension with blood pressure goal below 130/90, diabetes with hemoglobin A1c goal below 6.5% and cholesterol with LDL cholesterol (bad cholesterol) goal below 70 mg/dL. I also advised the patient to eat a healthy diet with plenty of whole grains, cereals, fruits and vegetables, exercise regularly and maintain ideal body weight.  Followup in the future with me in 4 months or call earlier if needed       Thank you for coming to see [place] at [facility]. I hope we have been able to provide you high quality care today.  You may receive a patient satisfaction survey over the next few weeks. We would appreciate your feedback and comments so that we may continue to improve ourselves and the health of our patients.

## 2019-06-22 NOTE — Progress Notes (Signed)
[facility] [address] [place]. [place] [address] [phone]       [place]  [name] Date of Birth:  [birth year] Medical Record Number:  [id number]   Reason for Referral:  hospital stroke follow up    SUBJECTIVE:   CHIEF COMPLAINT:  Chief Complaint  Patient presents with  . Follow-up    hospital fu, hx of stroke, with son     HPI:   Danielle Bryan is a 86 y.o. female with history of CVA, HTN, HLD who presented on 01/26/2019 with vertigo/dizziness.  Evaluated by stroke team and found to have incidental finding of right hippocampal infarct secondary to intracranial atherosclerosis however embolic source cannot be ruled out.  Likely symptoms due to BPPV as Dix-hallpike maneuver positive and recommend outpatient PT.  Recommended 30-day cardiac event monitor to rule out A. fib as potential etiology.  Recommended DAPT for 3 weeks then Plavix alone.  Prior history of TIA in 02/2014 and 02/2016.  HTN stable.  LDL 179 and recommended increasing home pitavastatin dosage.  Underlying seizure disorder with continuation of divalproex 500 mg daily.  Further evaluation by PT and recommended discharge to SNF.  BPPV, right posterior semi cannel   Dix-Hallpike maneuver showed on sitting up right upper beat nystagmus  Received canalith repositioning maneuver  PT on board  Recommend outpatient PT follow-up  Fall precaution  Stroke, incidental - R hippocampal infarct likely secondary to intracranial atherosclerosis. However, can not completely rule out embolic source.   MRI  R hippocampal infarct. Small vessel disease. Atrophy.   CTA head and neck significant intracranial medium to small sized vessel  atherosclerosis including bilateral MCA branches and bilateral PCA branches  2D Echo EF 60 to 65%  LE venous doppler no DVT  EEG no seizure  Recommend 30 day monitoring as outpatient to rule out A. fib  LDL 179  HgbA1c 5.7  Lovenox 30  mg sq daily for VTE prophylaxis  clopidogrel 75 mg daily prior to admission, now on aspirin 81 mg daily and clopidogrel 75 mg daily. Continue DAPT x 3 weeks then back to plavix alone.   Therapy recommendations:  SNF therapy   Today, 06/22/2019, Danielle Bryan is being seen accompanied by her son for hospital follow-up.  She has been doing well since discharge without residual symptoms and complete resolution of BPPV symptoms.  Ongoing use of rolling walker due to gait impairment secondary to bilateral knee arthritis.  Denies any recent falls.  Continues on Plavix and pitavastatin for secondary stroke prevention.  Blood pressure today 153/78 and does monitor at home and typically SBP 140s.  No concerns at this time.    ROS:   14 system review of systems performed and negative with exception of joint pain  PMH:  Past Medical History:  Diagnosis Date  . Hypercholesterolemia   . Hypertension   . Hypothyroidism   . Osteoarthritis   . Reactive depression (situational)   . Stroke ([place])   . TIA (transient ischemic attack)    on plavix    PSH:  Past Surgical History:  Procedure Laterality Date  . CATARACT EXTRACTION Left 1992  . CATARACT EXTRACTION Right 1995  . DEBRIDEMENT AND CLOSURE WOUND      Social History:  Social History   Socioeconomic History  . Marital status: Married    Spouse name: Not on file  . Number of children: 2  . Years of education: 83  . Highest education level:  Not on file  Occupational History  . Not on file  Tobacco Use  . Smoking status: Never Smoker  . Smokeless tobacco: Never Used  Substance and Sexual Activity  . Alcohol use: No    Alcohol/week: 0.0 standard drinks  . Drug use: No  . Sexual activity: Not on file  Other Topics Concern  . Not on file  Social History Narrative   Lives at home alone.    Has 2 children.   Caffeine: occasional    Social Determinants of Radio broadcast assistant Strain:   . Difficulty of Paying Living Expenses:    Food Insecurity: No Food Insecurity  . Worried About Charity fundraiser in the Last Year: Never true  . Ran Out of Food in the Last Year: Never true  Transportation Needs: No Transportation Needs  . Lack of Transportation (Medical): No  . Lack of Transportation (Non-Medical): No  Physical Activity:   . Days of Exercise per Week:   . Minutes of Exercise per Session:   Stress:   . Feeling of Stress :   Social Connections:   . Frequency of Communication with Friends and Family:   . Frequency of Social Gatherings with Friends and Family:   . Attends Religious Services:   . Active Member of Clubs or Organizations:   . Attends Archivist Meetings:   Marland Kitchen Marital Status:   Intimate Partner Violence:   . Fear of Current or Ex-Partner:   . Emotionally Abused:   Marland Kitchen Physically Abused:   . Sexually Abused:     Family History:  Family History  Problem Relation Age of Onset  . Heart failure Mother   . Pneumonia Mother   . Heart failure Father     Medications:   Current Outpatient Medications on File Prior to Visit  Medication Sig Dispense Refill  . acetaminophen (TYLENOL) 325 MG tablet Take 325 mg by mouth every 6 (six) hours as needed for mild pain.     Marland Kitchen amLODipine (NORVASC) 5 MG tablet Take 5 mg by mouth daily.    . Betamethasone Sodium Phosphate 6 MG/ML SOLN INJECT 2:4 SYRINGE BILATERALLY INTO EACH KNEE JOINT    . Calcium Carb-Cholecalciferol (CALCIUM 600/VITAMIN D3) 600-800 MG-UNIT TABS Take 1 tablet by mouth 2 (two) times daily.     . clopidogrel (PLAVIX) 75 MG tablet Take 1 tablet (75 mg total) by mouth daily. 30 tablet 3  . divalproex (DEPAKOTE ER) 500 MG 24 hr tablet Take 1 tablet (500 mg total) by mouth daily. 90 tablet 3  . levothyroxine (SYNTHROID, LEVOTHROID) 100 MCG tablet TAKE 1 TABLET (100 MCG TOTAL) BY MOUTH DAILY. TAKING 6 DAYS A WEEK. DON'T TAKE ON SUNDAYS (Patient taking differently: Take 100 mcg by mouth See admin instructions. Taking Daily except on Sunday,)  90 tablet 0  . Multiple Vitamin (MULTIVITAMIN WITH MINERALS) TABS tablet Take 1 tablet by mouth daily.    . pantoprazole (PROTONIX) 40 MG tablet Take 40 mg by mouth daily.    . Pitavastatin Calcium (LIVALO) 2 MG TABS Take 2 tablets (4 mg total) by mouth once a week. 10 tablet 3  . sertraline (ZOLOFT) 25 MG tablet Take 25 mg by mouth daily.     No current facility-administered medications on file prior to visit.    Allergies:   Allergies  Allergen Reactions  . Escitalopram Oxalate Nausea Only  . Lisinopril Other (See Comments)    Very decreased blood pressure   . Statins  myalgias  . Sulfa Antibiotics     hives      OBJECTIVE:  Physical Exam  Vitals:   06/22/19 1533  BP: (!) 153/78  Pulse: 96  Temp: 98.4 F (36.9 C)  Weight: 120 lb (54.4 kg)  Height: 5\' 2"  (1.575 m)   Body mass index is 21.95 kg/m. No exam data present  General: well developed, well nourished, very pleasant elderly Caucasian female, seated, in no evident distress Head: head normocephalic and atraumatic.   Neck: supple with no carotid or supraclavicular bruits Cardiovascular: regular rate and rhythm, no murmurs Musculoskeletal: no deformity Skin:  no rash/petichiae Vascular:  Normal pulses all extremities   Neurologic Exam Mental Status: Awake and fully alert. Oriented to place and time. Recent and remote memory intact. Attention span, concentration and fund of knowledge appropriate. Mood and affect appropriate.  Cranial Nerves: Fundoscopic exam reveals sharp disc margins. Pupils equal, briskly reactive to light. Extraocular movements full without nystagmus. Visual fields full to confrontation.  HOH bilaterally. Facial sensation intact. Face, tongue, palate moves normally and symmetrically.  Motor: Normal bulk and tone. Normal strength in all tested extremity muscles. Sensory.: intact to touch , pinprick , position and vibratory sensation.  Coordination: Rapid alternating movements normal in all  extremities. Finger-to-nose and heel-to-shin performed accurately bilaterally. Gait and Station: Arises from chair without difficulty. Stance is slightly hunched. Gait demonstrates  short shuffled steps with use of walker Reflexes: 1+ and symmetric. Toes downgoing.     NIHSS  0 Modified Rankin  0     ASSESSMENT: Danielle Bryan is a 86 y.o. year old female presented with vertigo/dizziness on 01/26/2019 likely due to BPPV with incidental right hippocampal infarct secondary to intracranial arthrosclerosis. Vascular risk factors include HTN, HLD, intracranial atherosclerosis and prior TIA.  Has been stable since discharge without residual deficits     PLAN:  1. Incidental stroke finding: Continue clopidogrel 75 mg daily  and pitavastatin  for secondary stroke prevention. Maintain strict control of hypertension with blood pressure goal below 130/90, diabetes with hemoglobin A1c goal below 6.5% and cholesterol with LDL cholesterol (bad cholesterol) goal below 70 mg/dL.  I also advised the patient to eat a healthy diet with plenty of whole grains, cereals, fruits and vegetables, exercise regularly with at least 30 minutes of continuous activity daily and maintain ideal body weight.  Recommended 30-day cardiac event monitor outpatient as embolic source cannot be ruled out but patient declines interest at this time. 2. BPPV: Complete resolution after participation in PT 3. HTN: Stable.  Continue to follow PCP for monitoring management 4. HLD: Continue to follow with PCP for monitoring and management   Follow up in 4 months or call earlier if needed   I spent 45 minutes of face-to-face and non-face-to-face time with patient.  This included previsit chart review, lab review, study review, order entry, electronic health record documentation, patient education regarding recent stroke as well as BPPV, importance of managing stroke risk factors and answered all questions to patient satisfaction      Frann Rider, [facility]  [facility] [address] [place], [place] [address]  Phone [phone] Fax [phone] Note: This document was prepared with digital dictation and possible smart phrase technology. Any transcriptional errors that result from this process are unintentional.

## 2019-06-27 NOTE — Progress Notes (Signed)
I agree with the above plan 

## 2019-07-04 DIAGNOSIS — M25511 Pain in right shoulder: Secondary | ICD-10-CM | POA: Diagnosis not present

## 2019-07-04 DIAGNOSIS — M25512 Pain in left shoulder: Secondary | ICD-10-CM | POA: Diagnosis not present

## 2019-07-18 ENCOUNTER — Other Ambulatory Visit: Payer: Self-pay

## 2019-07-18 NOTE — Patient Outreach (Signed)
Danielle Bryan) Care Management  07/18/2019  BRYAN OMURA 11-12-1923 [id number]   Telephone Assessment   Outreach attempt # 1 to patient. Spoke with patient who denies any acute issues or concerns at present and reports she has been doing fairly well. She continues too have paid caregivers coming in the home through out he day and night to assist her. She voices that her son checks on her regularly as well. She denies any recent falls. She has completed HHPT. Patient reports she is walking with walker. She has been getting some injections to her knees every so often. Patient states she goes for an appt later this week to have injection done and reports good relief from shots. Appetite remains fair. She completed neuro MD appt recently and states appt went well. She denies any RN CM needs or concerns at this time.   Medications Reviewed Today    Reviewed by [name], RN (Registered Nurse) on 07/18/19 at 1257  Med List Status: <None>  Medication Order Taking? Sig Documenting Provider Last Dose Status Informant  acetaminophen (TYLENOL) 325 MG tablet [id number] No Take 325 mg by mouth every 6 (six) hours as needed for mild pain.  [provider] Taking Active Child           Med Note [name], [name] Mar 18, 2015  1:53 AM)    amLODipine (NORVASC) 5 MG tablet [id number] No Take 5 mg by mouth daily. [provider] Taking Active Child  Betamethasone Sodium Phosphate 6 MG/ML SOLN [id number] No INJECT 2:4 SYRINGE BILATERALLY INTO [facility] KNEE JOINT [provider] Taking Active   Calcium Carb-Cholecalciferol (CALCIUM 600/VITAMIN D3) 600-800 MG-UNIT TABS [id number] No Take 1 tablet by mouth 2 (two) times daily.  [provider] Taking Active Child  clopidogrel (PLAVIX) 75 MG tablet [id number] No Take 1 tablet (75 mg total) by mouth daily. Velna Hatchet, MD Taking Active Child  divalproex (DEPAKOTE ER) 500 MG 24 hr tablet [id number]  No Take 1 tablet (500 mg total) by mouth daily. Frann Rider, NP Taking Active   levothyroxine (SYNTHROID, LEVOTHROID) 100 MCG tablet [id number] No TAKE 1 TABLET (100 MCG TOTAL) BY MOUTH DAILY. TAKING 6 DAYS A WEEK. DON'T TAKE ON SUNDAYS  Patient taking differently: Take 100 mcg by mouth See admin instructions. Taking Daily except on Sunday,   Darlin Coco, MD Taking Active Child  Multiple Vitamin (MULTIVITAMIN WITH MINERALS) TABS tablet [id number] No Take 1 tablet by mouth daily. [provider] Taking Active Child  pantoprazole (PROTONIX) 40 MG tablet [id number] No Take 40 mg by mouth daily. [provider] Taking Active Child  Pitavastatin Calcium (LIVALO) 2 MG TABS [id number] No Take 2 tablets (4 mg total) by mouth once a week. Sheikh, Omair Box, DO Taking Active   sertraline (ZOLOFT) 25 MG tablet [id number] No Take 25 mg by mouth daily. [provider] Taking Active Child         [facility] CM Care Plan Problem One     Most Recent Value  Care Plan Problem One  Patient at risk for readmission to [facility].  Role Documenting the Problem One  Care Management Telephonic Coordinator  [facility] CM Short Term Goal #1   Patient will complete all MD follow up appts over the next 30 days.  [facility] CM Short Term Goal #1 Start Date  02/28/19  [facility] CM Short Term Goal #1 Met Date  07/18/19    [place] [facility] CM Care Plan Problem Two  Most Recent Value  Care Plan Problem Two  Patient at risk for falls due to deconditioning and weakness.  Role Documenting the Problem Two  Care Management Telephonic [place] for Problem Two  Active  Interventions for Problem Two Long Term Goal   RN CM completed falls/safety eval. RN CM reinforced safety measures in the home and evaluated for adequate DME and in home support in place.  [facility]N Long Term Goal  Patient will report no falls in the home over the next 90 days.  [facility] Long Term Goal Start Date  07/18/19  [facility] Long Term Goal Met Date  05/03/19     [facility] [place] CM Care Plan Problem Three     Most Recent Value  Care Plan Problem Three  Health maintenance-pt needs COVID vaccine  Role Documenting the Problem Three  Care Management Telephonic Coordinator  Care Plan for Problem Three  Active  THN Long Term Goal   Patient will report obtaining COVID vaccine within the next 90 days.  [facility] Long Term Goal Start Date  05/03/19  Interventions for Problem Three Long Term Goal  RN CM assessed for vaccination status and encouraged pt to discuss with son and MD.       Plan: RN CM discussed with patient next outreach within the month of July. Patient gave verbal consent and in agreement with RN CM follow up and timeframe. Patient aware that they may contact RN CM sooner for any issues or concerns. RN CM will send quarterly update to PCP.  Enzo Montgomery, RN,BSN,CCM [place] Management Telephonic Care Management Coordinator Direct Phone: [phone] Toll Free: [phone] Fax: [phone]

## 2019-07-19 ENCOUNTER — Ambulatory Visit: Payer: Self-pay

## 2019-07-20 DIAGNOSIS — M170 Bilateral primary osteoarthritis of knee: Secondary | ICD-10-CM | POA: Diagnosis not present

## 2019-08-09 ENCOUNTER — Ambulatory Visit: Payer: Medicare HMO | Admitting: Podiatry

## 2019-08-30 ENCOUNTER — Other Ambulatory Visit: Payer: Self-pay

## 2019-08-30 NOTE — Patient Outreach (Addendum)
[name]) Care Management  08/30/2019  Danielle Bryan 07-Apr-1923 [id number]   Telephone Assessment    Outreach attempt #1 to patient. Spoke briefly with patient. She reported she did not feel up to talking much today. Patient shares that she is dealing with the recent death of her brother in law and now her brother has passed away as well. Condolences and emotional support given. She reports the service will be later this week and she plans to attend. She confirms she has several family members and support system in place to help her during this time. She denies any acute issue or concerns medically with her. She has been up ambulating with assistive device and reports no recent falls. She continues to have in home support. She is unsure about upcoming appts as son manages that. She denies any RN CM needs or concerns at this time.     [facility] CM Care Plan Problem Two     Most Recent Value  Care Plan Problem Two Patient at risk for falls due to deconditioning and weakness.  Role Documenting the Problem Two Care Management Telephonic [place] for Problem Two Active  Interventions for Problem Two Long Term Goal  RN CM completed fall/safety eval. RN CM reinforced fall prevention measures. RNCM assessed for adequate in home support.   THN Long Term Goal Patient will report no falls in the home over the next 90 days.  THN Long Term Goal Start Date 07/18/19    [facility] CM Care Plan Problem Three     Most Recent Value  Care Plan Problem Three Health maintenance-pt needs COVID vaccine  Role Documenting the Problem Three Care Management Telephonic Coordinator  Care Plan for Problem Three Active  THN Long Term Goal  Patient will report obtaining COVID vaccine within the next 90 days.  THN Long Term Goal Start Date 05/03/19  Interventions for Problem Three Long Term Goal RN CM did not address this call.      Plan: RN CM discussed with patient next outreach within the  month of Sept. Patient gave verbal consent and in agreement with RN CM follow up and timeframe. Patient aware that they may contact RN CM sooner for any issues or concerns.  RN CM will send quarterly update to PCP.   Enzo Montgomery, RN,BSN,CCM [place] Management Telephonic Care Management Coordinator Direct Phone: [phone] Toll Free: [phone] Fax: [phone]

## 2019-09-12 DIAGNOSIS — H9313 Tinnitus, bilateral: Secondary | ICD-10-CM | POA: Diagnosis not present

## 2019-09-12 DIAGNOSIS — H9193 Unspecified hearing loss, bilateral: Secondary | ICD-10-CM | POA: Diagnosis not present

## 2019-09-12 DIAGNOSIS — I119 Hypertensive heart disease without heart failure: Secondary | ICD-10-CM | POA: Diagnosis not present

## 2019-10-17 DIAGNOSIS — M25511 Pain in right shoulder: Secondary | ICD-10-CM | POA: Diagnosis not present

## 2019-10-17 DIAGNOSIS — M19012 Primary osteoarthritis, left shoulder: Secondary | ICD-10-CM | POA: Diagnosis not present

## 2019-10-17 DIAGNOSIS — M25512 Pain in left shoulder: Secondary | ICD-10-CM | POA: Diagnosis not present

## 2019-10-17 DIAGNOSIS — M19011 Primary osteoarthritis, right shoulder: Secondary | ICD-10-CM | POA: Diagnosis not present

## 2019-10-19 DIAGNOSIS — F331 Major depressive disorder, recurrent, moderate: Secondary | ICD-10-CM | POA: Diagnosis not present

## 2019-10-19 DIAGNOSIS — R29898 Other symptoms and signs involving the musculoskeletal system: Secondary | ICD-10-CM | POA: Diagnosis not present

## 2019-10-19 DIAGNOSIS — R5383 Other fatigue: Secondary | ICD-10-CM | POA: Diagnosis not present

## 2019-10-19 DIAGNOSIS — E039 Hypothyroidism, unspecified: Secondary | ICD-10-CM | POA: Diagnosis not present

## 2019-10-19 DIAGNOSIS — E785 Hyperlipidemia, unspecified: Secondary | ICD-10-CM | POA: Diagnosis not present

## 2019-10-19 DIAGNOSIS — M810 Age-related osteoporosis without current pathological fracture: Secondary | ICD-10-CM | POA: Diagnosis not present

## 2019-10-22 DIAGNOSIS — M170 Bilateral primary osteoarthritis of knee: Secondary | ICD-10-CM | POA: Diagnosis not present

## 2019-10-22 DIAGNOSIS — M19011 Primary osteoarthritis, right shoulder: Secondary | ICD-10-CM | POA: Diagnosis not present

## 2019-10-22 DIAGNOSIS — E039 Hypothyroidism, unspecified: Secondary | ICD-10-CM | POA: Diagnosis not present

## 2019-10-22 DIAGNOSIS — E785 Hyperlipidemia, unspecified: Secondary | ICD-10-CM | POA: Diagnosis not present

## 2019-10-22 DIAGNOSIS — F331 Major depressive disorder, recurrent, moderate: Secondary | ICD-10-CM | POA: Diagnosis not present

## 2019-10-22 DIAGNOSIS — M19012 Primary osteoarthritis, left shoulder: Secondary | ICD-10-CM | POA: Diagnosis not present

## 2019-10-22 DIAGNOSIS — G252 Other specified forms of tremor: Secondary | ICD-10-CM | POA: Diagnosis not present

## 2019-10-22 DIAGNOSIS — R5383 Other fatigue: Secondary | ICD-10-CM | POA: Diagnosis not present

## 2019-10-22 DIAGNOSIS — I10 Essential (primary) hypertension: Secondary | ICD-10-CM | POA: Diagnosis not present

## 2019-10-24 ENCOUNTER — Ambulatory Visit: Payer: Medicare HMO | Admitting: Adult Health

## 2019-10-24 ENCOUNTER — Encounter: Payer: Self-pay | Admitting: Adult Health

## 2019-10-24 VITALS — BP 148/75 | HR 79 | Ht 60.0 in | Wt 119.0 lb

## 2019-10-24 DIAGNOSIS — G4700 Insomnia, unspecified: Secondary | ICD-10-CM | POA: Diagnosis not present

## 2019-10-24 DIAGNOSIS — Z8673 Personal history of transient ischemic attack (TIA), and cerebral infarction without residual deficits: Secondary | ICD-10-CM | POA: Diagnosis not present

## 2019-10-24 DIAGNOSIS — H8113 Benign paroxysmal vertigo, bilateral: Secondary | ICD-10-CM | POA: Diagnosis not present

## 2019-10-24 DIAGNOSIS — R569 Unspecified convulsions: Secondary | ICD-10-CM

## 2019-10-24 DIAGNOSIS — I10 Essential (primary) hypertension: Secondary | ICD-10-CM

## 2019-10-24 DIAGNOSIS — E785 Hyperlipidemia, unspecified: Secondary | ICD-10-CM | POA: Diagnosis not present

## 2019-10-24 MED ORDER — SERTRALINE HCL 50 MG PO TABS: 50.0000 mg | ORAL_TABLET | Freq: Every day | ORAL | 3 refills | Status: DC

## 2019-10-24 NOTE — Progress Notes (Signed)
[facility] [address] [place]. [place] [address] [phone]       OFFICE FOLLOW UP NOTE  [name] Date of Birth:  11-11-1923 Medical Record Number:  [id number]   Reason for Referral: Hospital follow up    SUBJECTIVE:   CHIEF COMPLAINT:  Chief Complaint  Patient presents with  . Follow-up    rm 2  . Transient Ischemic Attack    No reoccurring stroke/TIA symptoms.  Does report difficulty with insomnia    HPI:   Today, 10/24/2019, Danielle Bryan returns for 4 month follow-up regarding episode of BPPV and incidental right hippocampal stroke finding in 01/2019  She has been doing well since prior visit without reoccurring BPV type symptoms and denies new stroke/TIA symptoms  Remains on clopidogrel and pitavastatin for secondary stroke prevention without side effects Blood pressure today 170/75 and on recheck 148/75.  Remains on amlodipine 5 mg daily.  Monitors at home and typically stable. Follows closely with PCP for HTN and HLD management  Remains on Depakote for possible seizure activity in 02/2016 Tolerating medication well without side effects and denies any reoccurring seizure type activity  She does report increased insomnia and questions possible medication side effect She does admit to mind racing while laying in bed as well as increased anxiety and depression.  She continues to live on her own but does have her son assist as needed She is currently on sertraline 25 mg daily tolerating dosage well without side effects  No further concerns at this time    History provided for reference purposes only hospital follow-up 06/14/2019 Danielle Bryan is being seen accompanied by her son for hospital follow-up.  She has been doing well since discharge without residual symptoms and complete resolution of BPPV symptoms.  Ongoing use of rolling walker due to gait impairment secondary to bilateral knee arthritis.  Denies any recent falls.  Continues on  Plavix and pitavastatin for secondary stroke prevention.  Blood pressure today 153/78 and does monitor at home and typically SBP 140s.  No concerns at this time.  Stroke admission 01/26/2019: Danielle Bryan is a 86 y.o. female with history of CVA, HTN, HLD who presented on 01/26/2019 with vertigo/dizziness.  Evaluated by stroke team and found to have incidental finding of right hippocampal infarct secondary to intracranial atherosclerosis however embolic source cannot be ruled out.  Likely symptoms due to BPPV as Dix-hallpike maneuver positive and recommend outpatient PT.  Recommended 30-day cardiac event monitor to rule out A. fib as potential etiology.  Recommended DAPT for 3 weeks then Plavix alone.  Prior history of TIA in 02/2014 and 02/2016.  HTN stable.  LDL 179 and recommended increasing home pitavastatin dosage.  Underlying seizure disorder with continuation of divalproex 500 mg daily.  Further evaluation by PT and recommended discharge to SNF.  BPPV, right posterior semi cannel   Dix-Hallpike maneuver showed on sitting up right upper beat nystagmus  Received canalith repositioning maneuver  PT on board  Recommend outpatient PT follow-up  Fall precaution  Stroke, incidental - R hippocampal infarct likely secondary to intracranial atherosclerosis. However, can not completely rule out embolic source.   MRI  R hippocampal infarct. Small vessel disease. Atrophy.   CTA head and neck significant intracranial medium to small sized vessel  atherosclerosis including bilateral MCA branches and bilateral PCA branches  2D Echo EF 60 to 65%  LE venous doppler no DVT  EEG no seizure  Recommend 30 day monitoring as outpatient  to rule out A. fib  LDL 179  HgbA1c 5.7  Lovenox 30 mg sq daily for VTE prophylaxis  clopidogrel 75 mg daily prior to admission, now on aspirin 81 mg daily and clopidogrel 75 mg daily. Continue DAPT x 3 weeks then back to plavix alone.   Therapy recommendations:   SNF therapy   ROS:   14 system review of systems performed and negative with exception of see HPI  PMH:  Past Medical History:  Diagnosis Date  . Hypercholesterolemia   . Hypertension   . Hypothyroidism   . Osteoarthritis   . Reactive depression (situational)   . Stroke ([place])   . TIA (transient ischemic attack)    on plavix    PSH:  Past Surgical History:  Procedure Laterality Date  . CATARACT EXTRACTION Left 1992  . CATARACT EXTRACTION Right 1995  . DEBRIDEMENT AND CLOSURE WOUND      Social History:  Social History   Socioeconomic History  . Marital status: Married    Spouse name: Not on file  . Number of children: 2  . Years of education: 47  . Highest education level: Not on file  Occupational History  . Not on file  Tobacco Use  . Smoking status: Never Smoker  . Smokeless tobacco: Never Used  Substance and Sexual Activity  . Alcohol use: No    Alcohol/week: 0.0 standard drinks  . Drug use: No  . Sexual activity: Not on file  Other Topics Concern  . Not on file  Social History Narrative   Lives at home alone.    Has 2 children.   Caffeine: occasional    Social Determinants of Radio broadcast assistant Strain:   . Difficulty of Paying Living Expenses: Not on file  Food Insecurity: No Food Insecurity  . Worried About Charity fundraiser in the Last Year: Never true  . Ran Out of Food in the Last Year: Never true  Transportation Needs: No Transportation Needs  . Lack of Transportation (Medical): No  . Lack of Transportation (Non-Medical): No  Physical Activity:   . Days of Exercise per Week: Not on file  . Minutes of Exercise per Session: Not on file  Stress:   . Feeling of Stress : Not on file  Social Connections:   . Frequency of Communication with Friends and Family: Not on file  . Frequency of Social Gatherings with Friends and Family: Not on file  . Attends Religious Services: Not on file  . Active Member of Clubs or Organizations: Not on  file  . Attends Archivist Meetings: Not on file  . Marital Status: Not on file  Intimate Partner Violence:   . Fear of Current or Ex-Partner: Not on file  . Emotionally Abused: Not on file  . Physically Abused: Not on file  . Sexually Abused: Not on file    Family History:  Family History  Problem Relation Age of Onset  . Heart failure Mother   . Pneumonia Mother   . Heart failure Father     Medications:   Current Outpatient Medications on File Prior to Visit  Medication Sig Dispense Refill  . acetaminophen (TYLENOL) 325 MG tablet Take 325 mg by mouth every 6 (six) hours as needed for mild pain.     Marland Kitchen amLODipine (NORVASC) 5 MG tablet Take 5 mg by mouth daily.    . Betamethasone Sodium Phosphate 6 MG/ML SOLN INJECT 2:4 SYRINGE BILATERALLY INTO EACH KNEE JOINT    .  Calcium Carb-Cholecalciferol (CALCIUM 600/VITAMIN D3) 600-800 MG-UNIT TABS Take 1 tablet by mouth 2 (two) times daily.     . clopidogrel (PLAVIX) 75 MG tablet Take 1 tablet (75 mg total) by mouth daily. 30 tablet 3  . divalproex (DEPAKOTE ER) 500 MG 24 hr tablet Take 1 tablet (500 mg total) by mouth daily. 90 tablet 3  . levothyroxine (SYNTHROID, LEVOTHROID) 100 MCG tablet TAKE 1 TABLET (100 MCG TOTAL) BY MOUTH DAILY. TAKING 6 DAYS A WEEK. DON'T TAKE ON SUNDAYS (Patient taking differently: Take 100 mcg by mouth See admin instructions. Taking Daily except on Sunday,) 90 tablet 0  . Multiple Vitamin (MULTIVITAMIN WITH MINERALS) TABS tablet Take 1 tablet by mouth daily.    . pantoprazole (PROTONIX) 40 MG tablet Take 40 mg by mouth daily.    . Pitavastatin Calcium (LIVALO) 2 MG TABS Take 2 tablets (4 mg total) by mouth once a week. 10 tablet 3   No current facility-administered medications on file prior to visit.    Allergies:   Allergies  Allergen Reactions  . Escitalopram Oxalate Nausea Only  . Lisinopril Other (See Comments)    Very decreased blood pressure   . Statins     myalgias  . Sulfa Antibiotics       hives      OBJECTIVE:  Physical Exam  Vitals:   10/24/19 1553 10/24/19 1630  BP: (!) 170/75 (!) 148/75  Pulse: 89 79  Weight: 119 lb (54 kg)   Height: 5' (1.524 m)    Body mass index is 23.24 kg/m. No exam data present  Depression screen [facility] 2/9 10/24/2019 02/28/2019  Decreased Interest 0 0  Down, Depressed, Hopeless 1 0  PHQ - 2 Score 1 0  Altered sleeping 1 -  Tired, decreased energy 0 -  Change in appetite 0 -  Feeling bad or failure about yourself  1 -  Trouble concentrating 0 -  Moving slowly or fidgety/restless 1 -  Suicidal thoughts 0 -  PHQ-9 Score 4 -   GAD 7 : Generalized Anxiety Score 10/24/2019  Nervous, Anxious, on Edge 1  Control/stop worrying 1  Worry too much - different things 1  Trouble relaxing 1  Restless 1  Easily annoyed or irritable 0  Afraid - awful might happen 1  Total GAD 7 Score 6  Anxiety Difficulty Not difficult at all    General: Frail very pleasant elderly Caucasian female, seated, in no evident distress Head: head normocephalic and atraumatic.   Neck: supple with no carotid or supraclavicular bruits Cardiovascular: regular rate and rhythm, no murmurs Musculoskeletal: no deformity Skin:  no rash/petichiae Vascular:  Normal pulses all extremities   Neurologic Exam Mental Status: Awake and fully alert.  Fluent speech and language. Oriented to place and time. Recent and remote memory intact. Attention span, concentration and fund of knowledge appropriate. Mood and affect appropriate.  Cranial Nerves: Pupils equal, briskly reactive to light. Extraocular movements full without nystagmus. Visual fields full to confrontation.  HOH bilaterally. Facial sensation intact. Face, tongue, palate moves normally and symmetrically.  Motor: Normal bulk and tone. Normal strength in all tested extremity muscles. Sensory.: intact to touch , pinprick , position and vibratory sensation.  Coordination: Rapid alternating movements normal in all  extremities. Finger-to-nose and heel-to-shin performed accurately bilaterally. Gait and Station: Arises from chair without difficulty. Stance is slightly hunched. Gait demonstrates  short shuffled steps with use of walker Reflexes: 1+ and symmetric. Toes downgoing.  ASSESSMENT: Danielle Bryan is a 86 y.o. year old female presented with vertigo/dizziness on 01/26/2019 likely due to BPPV with incidental right hippocampal infarct secondary to intracranial arthrosclerosis. Vascular risk factors include HTN, HLD, intracranial atherosclerosis and prior TIA.  Has been stable since discharge without residual deficits     PLAN:  1. Insomnia:  a. ddx anxiety related vs medication side effect vs limited daytime activity b. Recommend increasing sertraline to 50 mg daily c. Discussed taking Depakote ER in the morning as she currently takes at bedtime.  She is hesitant to do this due to concern of possible reoccurring seizure.  Reassured her that changing the time she takes medication would not increase her risk of seizure.  She wishes to continue taking in the evening at this time d. Discussed increasing daytime activity as tolerated as she is currently sedentary but also limited due to multiple areas of joint pain causing gait impairment 2. R hippocampal stroke:  a. Continue clopidogrel 75 mg daily  and pitavastatin  for secondary stroke prevention.   b. Ensure close PCP follow-up for aggressive stroke risk factor management  c. recommended 30-day cardiac event monitor outpatient as embolic source cannot be ruled out but patient declines interest at this time. 3. BPPV:  a. Stable.   b. No reoccurrence 4. Seizure type activity:  a. Presented with speech difficulties in 02/2016 diagnosed with L brain TIA vs seizure.   b. Reported prior occurrence and speech difficulty therefore concern for seizure type activity.   c. Remains on Depakote ER 500mg  nightly for seizure prophylaxis.   d. No  reoccurring seizure type activity or events e. She reports recently having lab work obtained by PCP -will attempt to obtain lab work and if Depakote level was not checked, this will be obtained at follow-up visit 5. HTN:  a. BP goal<130/90.   b. Stable.   c. Managed by PCP. 6. HLD:  a. LDL goal<70.   b. Continue pitavastatin.  c.  Management prescriber PCP.   Follow up in 2 months or call earlier if needed   I spent 30 minutes of face-to-face and non-face-to-face time with patient.  This included previsit chart review, lab review, study review, order entry, electronic health record documentation, patient education regarding concerns of insomnia And Potential Causes, underlying anxiety/depression, recent stroke as well as BPPV, importance of managing stroke risk factors and answered all questions to patient satisfaction     Frann Rider, AGNP-BC  [facility] [address] [place], [place] [address]  Phone [phone] Fax [phone] Note: This document was prepared with digital dictation and possible smart phrase technology. Any transcriptional errors that result from this process are unintentional.

## 2019-10-24 NOTE — Patient Instructions (Addendum)
Your Plan:  Increase sertraline from 25 mg daily to 50 mg daily as underlying depression and anxiety may be contributing to your difficulty sleeping at night.  You may take 2 of your 25 mg tablets as you just recently refilled your prescription but additional refills will be sent to your pharmacy  Continue Plavix and pitavastatin for secondary stroke prevention  Ensure close PCP follow-up for aggressive stroke risk factor management including blood pressure and cholesterol  Continue Depakote ER 500 mg daily for seizure prophylaxis  We will attempt to obtain your recent lab work results from your PCP office    Follow-up in 2 months or call earlier if needed     Thank you for coming to see [place] at [facility]. I hope we have been able to provide you high quality care today.  You may receive a patient satisfaction survey over the next few weeks. We would appreciate your feedback and comments so that we may continue to improve ourselves and the health of our patients.

## 2019-10-25 DIAGNOSIS — F331 Major depressive disorder, recurrent, moderate: Secondary | ICD-10-CM | POA: Diagnosis not present

## 2019-10-25 DIAGNOSIS — M170 Bilateral primary osteoarthritis of knee: Secondary | ICD-10-CM | POA: Diagnosis not present

## 2019-10-25 DIAGNOSIS — M19012 Primary osteoarthritis, left shoulder: Secondary | ICD-10-CM | POA: Diagnosis not present

## 2019-10-25 DIAGNOSIS — M19011 Primary osteoarthritis, right shoulder: Secondary | ICD-10-CM | POA: Diagnosis not present

## 2019-10-25 DIAGNOSIS — E039 Hypothyroidism, unspecified: Secondary | ICD-10-CM | POA: Diagnosis not present

## 2019-10-25 DIAGNOSIS — G252 Other specified forms of tremor: Secondary | ICD-10-CM | POA: Diagnosis not present

## 2019-10-25 DIAGNOSIS — R5383 Other fatigue: Secondary | ICD-10-CM | POA: Diagnosis not present

## 2019-10-25 DIAGNOSIS — I10 Essential (primary) hypertension: Secondary | ICD-10-CM | POA: Diagnosis not present

## 2019-10-25 DIAGNOSIS — E785 Hyperlipidemia, unspecified: Secondary | ICD-10-CM | POA: Diagnosis not present

## 2019-10-25 NOTE — Progress Notes (Signed)
I agree with the above plan 

## 2019-10-26 ENCOUNTER — Other Ambulatory Visit: Payer: Self-pay

## 2019-10-26 DIAGNOSIS — M19012 Primary osteoarthritis, left shoulder: Secondary | ICD-10-CM | POA: Diagnosis not present

## 2019-10-26 DIAGNOSIS — M19011 Primary osteoarthritis, right shoulder: Secondary | ICD-10-CM | POA: Diagnosis not present

## 2019-10-26 DIAGNOSIS — G252 Other specified forms of tremor: Secondary | ICD-10-CM | POA: Diagnosis not present

## 2019-10-26 DIAGNOSIS — F331 Major depressive disorder, recurrent, moderate: Secondary | ICD-10-CM | POA: Diagnosis not present

## 2019-10-26 DIAGNOSIS — M170 Bilateral primary osteoarthritis of knee: Secondary | ICD-10-CM | POA: Diagnosis not present

## 2019-10-26 DIAGNOSIS — E785 Hyperlipidemia, unspecified: Secondary | ICD-10-CM | POA: Diagnosis not present

## 2019-10-26 DIAGNOSIS — E039 Hypothyroidism, unspecified: Secondary | ICD-10-CM | POA: Diagnosis not present

## 2019-10-26 DIAGNOSIS — I10 Essential (primary) hypertension: Secondary | ICD-10-CM | POA: Diagnosis not present

## 2019-10-26 DIAGNOSIS — R5383 Other fatigue: Secondary | ICD-10-CM | POA: Diagnosis not present

## 2019-10-26 NOTE — Patient Outreach (Addendum)
[facility]) Care Management  10/26/2019  Danielle Bryan 05-11-1923 [id number]   Telephone Assessment   Outreach attempt # 1 to patient. Spoke with patient who denies any acute issues or concerns at present. She reports that she is "getting alone just fair." She continues to have paid caregivers in the home throughout the day and evening to assist her as well as supportive son. She went for neuro follow up appt earlier this week and voices appt went well. Appetite fair and WNL for patient. She reports she is eating about three small meals per day. She denies any falls. No pain reported. She voices no RN CM need or concerns at this time.      Plan: RN CM discussed with patient next outreach within the month of December . Patient gave verbal consent and in agreement with RN CM follow up and timeframe. Patient aware that they may contact RN CM sooner for any issues or concerns.  Enzo Montgomery, RN,BSN,CCM [place] Management Telephonic Care Management Coordinator Direct Phone: [phone] Toll Free: [phone] Fax: [phone]

## 2019-10-27 ENCOUNTER — Ambulatory Visit: Payer: Self-pay

## 2019-10-27 DIAGNOSIS — G252 Other specified forms of tremor: Secondary | ICD-10-CM | POA: Diagnosis not present

## 2019-10-27 DIAGNOSIS — E039 Hypothyroidism, unspecified: Secondary | ICD-10-CM | POA: Diagnosis not present

## 2019-10-27 DIAGNOSIS — F331 Major depressive disorder, recurrent, moderate: Secondary | ICD-10-CM | POA: Diagnosis not present

## 2019-10-27 DIAGNOSIS — M19012 Primary osteoarthritis, left shoulder: Secondary | ICD-10-CM | POA: Diagnosis not present

## 2019-10-27 DIAGNOSIS — R5383 Other fatigue: Secondary | ICD-10-CM | POA: Diagnosis not present

## 2019-10-27 DIAGNOSIS — M19011 Primary osteoarthritis, right shoulder: Secondary | ICD-10-CM | POA: Diagnosis not present

## 2019-10-27 DIAGNOSIS — M170 Bilateral primary osteoarthritis of knee: Secondary | ICD-10-CM | POA: Diagnosis not present

## 2019-10-27 DIAGNOSIS — I10 Essential (primary) hypertension: Secondary | ICD-10-CM | POA: Diagnosis not present

## 2019-10-27 DIAGNOSIS — E785 Hyperlipidemia, unspecified: Secondary | ICD-10-CM | POA: Diagnosis not present

## 2019-10-28 DIAGNOSIS — I10 Essential (primary) hypertension: Secondary | ICD-10-CM | POA: Diagnosis not present

## 2019-10-28 DIAGNOSIS — M19011 Primary osteoarthritis, right shoulder: Secondary | ICD-10-CM | POA: Diagnosis not present

## 2019-10-28 DIAGNOSIS — M19012 Primary osteoarthritis, left shoulder: Secondary | ICD-10-CM | POA: Diagnosis not present

## 2019-10-28 DIAGNOSIS — E039 Hypothyroidism, unspecified: Secondary | ICD-10-CM | POA: Diagnosis not present

## 2019-10-28 DIAGNOSIS — M170 Bilateral primary osteoarthritis of knee: Secondary | ICD-10-CM | POA: Diagnosis not present

## 2019-10-28 DIAGNOSIS — G252 Other specified forms of tremor: Secondary | ICD-10-CM | POA: Diagnosis not present

## 2019-10-28 DIAGNOSIS — E785 Hyperlipidemia, unspecified: Secondary | ICD-10-CM | POA: Diagnosis not present

## 2019-10-28 DIAGNOSIS — F331 Major depressive disorder, recurrent, moderate: Secondary | ICD-10-CM | POA: Diagnosis not present

## 2019-10-28 DIAGNOSIS — R5383 Other fatigue: Secondary | ICD-10-CM | POA: Diagnosis not present

## 2019-11-01 DIAGNOSIS — R5383 Other fatigue: Secondary | ICD-10-CM | POA: Diagnosis not present

## 2019-11-01 DIAGNOSIS — F331 Major depressive disorder, recurrent, moderate: Secondary | ICD-10-CM | POA: Diagnosis not present

## 2019-11-01 DIAGNOSIS — M170 Bilateral primary osteoarthritis of knee: Secondary | ICD-10-CM | POA: Diagnosis not present

## 2019-11-01 DIAGNOSIS — M19011 Primary osteoarthritis, right shoulder: Secondary | ICD-10-CM | POA: Diagnosis not present

## 2019-11-01 DIAGNOSIS — M19012 Primary osteoarthritis, left shoulder: Secondary | ICD-10-CM | POA: Diagnosis not present

## 2019-11-01 DIAGNOSIS — I10 Essential (primary) hypertension: Secondary | ICD-10-CM | POA: Diagnosis not present

## 2019-11-01 DIAGNOSIS — E039 Hypothyroidism, unspecified: Secondary | ICD-10-CM | POA: Diagnosis not present

## 2019-11-01 DIAGNOSIS — G252 Other specified forms of tremor: Secondary | ICD-10-CM | POA: Diagnosis not present

## 2019-11-01 DIAGNOSIS — E785 Hyperlipidemia, unspecified: Secondary | ICD-10-CM | POA: Diagnosis not present

## 2019-11-02 DIAGNOSIS — M170 Bilateral primary osteoarthritis of knee: Secondary | ICD-10-CM | POA: Diagnosis not present

## 2019-11-02 DIAGNOSIS — M19011 Primary osteoarthritis, right shoulder: Secondary | ICD-10-CM | POA: Diagnosis not present

## 2019-11-02 DIAGNOSIS — M19012 Primary osteoarthritis, left shoulder: Secondary | ICD-10-CM | POA: Diagnosis not present

## 2019-11-02 DIAGNOSIS — I10 Essential (primary) hypertension: Secondary | ICD-10-CM | POA: Diagnosis not present

## 2019-11-02 DIAGNOSIS — G252 Other specified forms of tremor: Secondary | ICD-10-CM | POA: Diagnosis not present

## 2019-11-02 DIAGNOSIS — E785 Hyperlipidemia, unspecified: Secondary | ICD-10-CM | POA: Diagnosis not present

## 2019-11-02 DIAGNOSIS — F331 Major depressive disorder, recurrent, moderate: Secondary | ICD-10-CM | POA: Diagnosis not present

## 2019-11-02 DIAGNOSIS — E039 Hypothyroidism, unspecified: Secondary | ICD-10-CM | POA: Diagnosis not present

## 2019-11-02 DIAGNOSIS — R5383 Other fatigue: Secondary | ICD-10-CM | POA: Diagnosis not present

## 2019-11-04 DIAGNOSIS — E785 Hyperlipidemia, unspecified: Secondary | ICD-10-CM | POA: Diagnosis not present

## 2019-11-04 DIAGNOSIS — E039 Hypothyroidism, unspecified: Secondary | ICD-10-CM | POA: Diagnosis not present

## 2019-11-04 DIAGNOSIS — G252 Other specified forms of tremor: Secondary | ICD-10-CM | POA: Diagnosis not present

## 2019-11-04 DIAGNOSIS — F331 Major depressive disorder, recurrent, moderate: Secondary | ICD-10-CM | POA: Diagnosis not present

## 2019-11-04 DIAGNOSIS — M19012 Primary osteoarthritis, left shoulder: Secondary | ICD-10-CM | POA: Diagnosis not present

## 2019-11-04 DIAGNOSIS — M19011 Primary osteoarthritis, right shoulder: Secondary | ICD-10-CM | POA: Diagnosis not present

## 2019-11-04 DIAGNOSIS — M170 Bilateral primary osteoarthritis of knee: Secondary | ICD-10-CM | POA: Diagnosis not present

## 2019-11-04 DIAGNOSIS — R5383 Other fatigue: Secondary | ICD-10-CM | POA: Diagnosis not present

## 2019-11-04 DIAGNOSIS — I10 Essential (primary) hypertension: Secondary | ICD-10-CM | POA: Diagnosis not present

## 2019-11-07 DIAGNOSIS — F331 Major depressive disorder, recurrent, moderate: Secondary | ICD-10-CM | POA: Diagnosis not present

## 2019-11-07 DIAGNOSIS — R5383 Other fatigue: Secondary | ICD-10-CM | POA: Diagnosis not present

## 2019-11-07 DIAGNOSIS — M170 Bilateral primary osteoarthritis of knee: Secondary | ICD-10-CM | POA: Diagnosis not present

## 2019-11-07 DIAGNOSIS — M19011 Primary osteoarthritis, right shoulder: Secondary | ICD-10-CM | POA: Diagnosis not present

## 2019-11-07 DIAGNOSIS — E785 Hyperlipidemia, unspecified: Secondary | ICD-10-CM | POA: Diagnosis not present

## 2019-11-07 DIAGNOSIS — E039 Hypothyroidism, unspecified: Secondary | ICD-10-CM | POA: Diagnosis not present

## 2019-11-07 DIAGNOSIS — G252 Other specified forms of tremor: Secondary | ICD-10-CM | POA: Diagnosis not present

## 2019-11-07 DIAGNOSIS — M19012 Primary osteoarthritis, left shoulder: Secondary | ICD-10-CM | POA: Diagnosis not present

## 2019-11-07 DIAGNOSIS — I10 Essential (primary) hypertension: Secondary | ICD-10-CM | POA: Diagnosis not present

## 2019-11-08 DIAGNOSIS — G252 Other specified forms of tremor: Secondary | ICD-10-CM | POA: Diagnosis not present

## 2019-11-08 DIAGNOSIS — M19012 Primary osteoarthritis, left shoulder: Secondary | ICD-10-CM | POA: Diagnosis not present

## 2019-11-08 DIAGNOSIS — M170 Bilateral primary osteoarthritis of knee: Secondary | ICD-10-CM | POA: Diagnosis not present

## 2019-11-08 DIAGNOSIS — M19011 Primary osteoarthritis, right shoulder: Secondary | ICD-10-CM | POA: Diagnosis not present

## 2019-11-08 DIAGNOSIS — F331 Major depressive disorder, recurrent, moderate: Secondary | ICD-10-CM | POA: Diagnosis not present

## 2019-11-08 DIAGNOSIS — R5383 Other fatigue: Secondary | ICD-10-CM | POA: Diagnosis not present

## 2019-11-08 DIAGNOSIS — E039 Hypothyroidism, unspecified: Secondary | ICD-10-CM | POA: Diagnosis not present

## 2019-11-08 DIAGNOSIS — E785 Hyperlipidemia, unspecified: Secondary | ICD-10-CM | POA: Diagnosis not present

## 2019-11-08 DIAGNOSIS — I10 Essential (primary) hypertension: Secondary | ICD-10-CM | POA: Diagnosis not present

## 2019-11-10 DIAGNOSIS — M19011 Primary osteoarthritis, right shoulder: Secondary | ICD-10-CM | POA: Diagnosis not present

## 2019-11-10 DIAGNOSIS — M19012 Primary osteoarthritis, left shoulder: Secondary | ICD-10-CM | POA: Diagnosis not present

## 2019-11-10 DIAGNOSIS — F331 Major depressive disorder, recurrent, moderate: Secondary | ICD-10-CM | POA: Diagnosis not present

## 2019-11-10 DIAGNOSIS — R5383 Other fatigue: Secondary | ICD-10-CM | POA: Diagnosis not present

## 2019-11-10 DIAGNOSIS — G252 Other specified forms of tremor: Secondary | ICD-10-CM | POA: Diagnosis not present

## 2019-11-10 DIAGNOSIS — I10 Essential (primary) hypertension: Secondary | ICD-10-CM | POA: Diagnosis not present

## 2019-11-10 DIAGNOSIS — M170 Bilateral primary osteoarthritis of knee: Secondary | ICD-10-CM | POA: Diagnosis not present

## 2019-11-10 DIAGNOSIS — E785 Hyperlipidemia, unspecified: Secondary | ICD-10-CM | POA: Diagnosis not present

## 2019-11-10 DIAGNOSIS — E039 Hypothyroidism, unspecified: Secondary | ICD-10-CM | POA: Diagnosis not present

## 2019-11-11 DIAGNOSIS — M170 Bilateral primary osteoarthritis of knee: Secondary | ICD-10-CM | POA: Diagnosis not present

## 2019-11-11 DIAGNOSIS — M19012 Primary osteoarthritis, left shoulder: Secondary | ICD-10-CM | POA: Diagnosis not present

## 2019-11-11 DIAGNOSIS — E039 Hypothyroidism, unspecified: Secondary | ICD-10-CM | POA: Diagnosis not present

## 2019-11-11 DIAGNOSIS — F331 Major depressive disorder, recurrent, moderate: Secondary | ICD-10-CM | POA: Diagnosis not present

## 2019-11-11 DIAGNOSIS — R5383 Other fatigue: Secondary | ICD-10-CM | POA: Diagnosis not present

## 2019-11-11 DIAGNOSIS — G252 Other specified forms of tremor: Secondary | ICD-10-CM | POA: Diagnosis not present

## 2019-11-11 DIAGNOSIS — M19011 Primary osteoarthritis, right shoulder: Secondary | ICD-10-CM | POA: Diagnosis not present

## 2019-11-11 DIAGNOSIS — E785 Hyperlipidemia, unspecified: Secondary | ICD-10-CM | POA: Diagnosis not present

## 2019-11-11 DIAGNOSIS — I10 Essential (primary) hypertension: Secondary | ICD-10-CM | POA: Diagnosis not present

## 2019-11-14 DIAGNOSIS — E039 Hypothyroidism, unspecified: Secondary | ICD-10-CM | POA: Diagnosis not present

## 2019-11-14 DIAGNOSIS — F331 Major depressive disorder, recurrent, moderate: Secondary | ICD-10-CM | POA: Diagnosis not present

## 2019-11-14 DIAGNOSIS — M170 Bilateral primary osteoarthritis of knee: Secondary | ICD-10-CM | POA: Diagnosis not present

## 2019-11-14 DIAGNOSIS — R5383 Other fatigue: Secondary | ICD-10-CM | POA: Diagnosis not present

## 2019-11-14 DIAGNOSIS — I10 Essential (primary) hypertension: Secondary | ICD-10-CM | POA: Diagnosis not present

## 2019-11-14 DIAGNOSIS — M19011 Primary osteoarthritis, right shoulder: Secondary | ICD-10-CM | POA: Diagnosis not present

## 2019-11-14 DIAGNOSIS — E785 Hyperlipidemia, unspecified: Secondary | ICD-10-CM | POA: Diagnosis not present

## 2019-11-14 DIAGNOSIS — M19012 Primary osteoarthritis, left shoulder: Secondary | ICD-10-CM | POA: Diagnosis not present

## 2019-11-14 DIAGNOSIS — G252 Other specified forms of tremor: Secondary | ICD-10-CM | POA: Diagnosis not present

## 2019-11-17 DIAGNOSIS — M19012 Primary osteoarthritis, left shoulder: Secondary | ICD-10-CM | POA: Diagnosis not present

## 2019-11-17 DIAGNOSIS — G252 Other specified forms of tremor: Secondary | ICD-10-CM | POA: Diagnosis not present

## 2019-11-17 DIAGNOSIS — R5383 Other fatigue: Secondary | ICD-10-CM | POA: Diagnosis not present

## 2019-11-17 DIAGNOSIS — I10 Essential (primary) hypertension: Secondary | ICD-10-CM | POA: Diagnosis not present

## 2019-11-17 DIAGNOSIS — E039 Hypothyroidism, unspecified: Secondary | ICD-10-CM | POA: Diagnosis not present

## 2019-11-17 DIAGNOSIS — M19011 Primary osteoarthritis, right shoulder: Secondary | ICD-10-CM | POA: Diagnosis not present

## 2019-11-17 DIAGNOSIS — E785 Hyperlipidemia, unspecified: Secondary | ICD-10-CM | POA: Diagnosis not present

## 2019-11-17 DIAGNOSIS — M170 Bilateral primary osteoarthritis of knee: Secondary | ICD-10-CM | POA: Diagnosis not present

## 2019-11-17 DIAGNOSIS — F331 Major depressive disorder, recurrent, moderate: Secondary | ICD-10-CM | POA: Diagnosis not present

## 2019-11-21 DIAGNOSIS — R5383 Other fatigue: Secondary | ICD-10-CM | POA: Diagnosis not present

## 2019-11-21 DIAGNOSIS — I10 Essential (primary) hypertension: Secondary | ICD-10-CM | POA: Diagnosis not present

## 2019-11-21 DIAGNOSIS — G252 Other specified forms of tremor: Secondary | ICD-10-CM | POA: Diagnosis not present

## 2019-11-21 DIAGNOSIS — F331 Major depressive disorder, recurrent, moderate: Secondary | ICD-10-CM | POA: Diagnosis not present

## 2019-11-21 DIAGNOSIS — E039 Hypothyroidism, unspecified: Secondary | ICD-10-CM | POA: Diagnosis not present

## 2019-11-21 DIAGNOSIS — E785 Hyperlipidemia, unspecified: Secondary | ICD-10-CM | POA: Diagnosis not present

## 2019-11-21 DIAGNOSIS — M19012 Primary osteoarthritis, left shoulder: Secondary | ICD-10-CM | POA: Diagnosis not present

## 2019-11-21 DIAGNOSIS — M19011 Primary osteoarthritis, right shoulder: Secondary | ICD-10-CM | POA: Diagnosis not present

## 2019-11-21 DIAGNOSIS — M170 Bilateral primary osteoarthritis of knee: Secondary | ICD-10-CM | POA: Diagnosis not present

## 2019-11-23 DIAGNOSIS — M170 Bilateral primary osteoarthritis of knee: Secondary | ICD-10-CM | POA: Diagnosis not present

## 2019-11-23 DIAGNOSIS — E785 Hyperlipidemia, unspecified: Secondary | ICD-10-CM | POA: Diagnosis not present

## 2019-11-23 DIAGNOSIS — M19012 Primary osteoarthritis, left shoulder: Secondary | ICD-10-CM | POA: Diagnosis not present

## 2019-11-23 DIAGNOSIS — G252 Other specified forms of tremor: Secondary | ICD-10-CM | POA: Diagnosis not present

## 2019-11-23 DIAGNOSIS — I10 Essential (primary) hypertension: Secondary | ICD-10-CM | POA: Diagnosis not present

## 2019-11-23 DIAGNOSIS — F331 Major depressive disorder, recurrent, moderate: Secondary | ICD-10-CM | POA: Diagnosis not present

## 2019-11-23 DIAGNOSIS — R5383 Other fatigue: Secondary | ICD-10-CM | POA: Diagnosis not present

## 2019-11-23 DIAGNOSIS — E039 Hypothyroidism, unspecified: Secondary | ICD-10-CM | POA: Diagnosis not present

## 2019-11-23 DIAGNOSIS — M19011 Primary osteoarthritis, right shoulder: Secondary | ICD-10-CM | POA: Diagnosis not present

## 2019-11-30 DIAGNOSIS — M19012 Primary osteoarthritis, left shoulder: Secondary | ICD-10-CM | POA: Diagnosis not present

## 2019-11-30 DIAGNOSIS — M170 Bilateral primary osteoarthritis of knee: Secondary | ICD-10-CM | POA: Diagnosis not present

## 2019-11-30 DIAGNOSIS — F331 Major depressive disorder, recurrent, moderate: Secondary | ICD-10-CM | POA: Diagnosis not present

## 2019-11-30 DIAGNOSIS — E785 Hyperlipidemia, unspecified: Secondary | ICD-10-CM | POA: Diagnosis not present

## 2019-11-30 DIAGNOSIS — G252 Other specified forms of tremor: Secondary | ICD-10-CM | POA: Diagnosis not present

## 2019-11-30 DIAGNOSIS — E039 Hypothyroidism, unspecified: Secondary | ICD-10-CM | POA: Diagnosis not present

## 2019-11-30 DIAGNOSIS — R5383 Other fatigue: Secondary | ICD-10-CM | POA: Diagnosis not present

## 2019-11-30 DIAGNOSIS — M19011 Primary osteoarthritis, right shoulder: Secondary | ICD-10-CM | POA: Diagnosis not present

## 2019-11-30 DIAGNOSIS — I10 Essential (primary) hypertension: Secondary | ICD-10-CM | POA: Diagnosis not present

## 2019-12-02 DIAGNOSIS — M170 Bilateral primary osteoarthritis of knee: Secondary | ICD-10-CM | POA: Diagnosis not present

## 2019-12-02 DIAGNOSIS — G252 Other specified forms of tremor: Secondary | ICD-10-CM | POA: Diagnosis not present

## 2019-12-02 DIAGNOSIS — I10 Essential (primary) hypertension: Secondary | ICD-10-CM | POA: Diagnosis not present

## 2019-12-02 DIAGNOSIS — F331 Major depressive disorder, recurrent, moderate: Secondary | ICD-10-CM | POA: Diagnosis not present

## 2019-12-02 DIAGNOSIS — R5383 Other fatigue: Secondary | ICD-10-CM | POA: Diagnosis not present

## 2019-12-02 DIAGNOSIS — M19012 Primary osteoarthritis, left shoulder: Secondary | ICD-10-CM | POA: Diagnosis not present

## 2019-12-02 DIAGNOSIS — E785 Hyperlipidemia, unspecified: Secondary | ICD-10-CM | POA: Diagnosis not present

## 2019-12-02 DIAGNOSIS — E039 Hypothyroidism, unspecified: Secondary | ICD-10-CM | POA: Diagnosis not present

## 2019-12-02 DIAGNOSIS — M19011 Primary osteoarthritis, right shoulder: Secondary | ICD-10-CM | POA: Diagnosis not present

## 2019-12-08 DIAGNOSIS — E785 Hyperlipidemia, unspecified: Secondary | ICD-10-CM | POA: Diagnosis not present

## 2019-12-08 DIAGNOSIS — I10 Essential (primary) hypertension: Secondary | ICD-10-CM | POA: Diagnosis not present

## 2019-12-08 DIAGNOSIS — R5383 Other fatigue: Secondary | ICD-10-CM | POA: Diagnosis not present

## 2019-12-08 DIAGNOSIS — M19012 Primary osteoarthritis, left shoulder: Secondary | ICD-10-CM | POA: Diagnosis not present

## 2019-12-08 DIAGNOSIS — E039 Hypothyroidism, unspecified: Secondary | ICD-10-CM | POA: Diagnosis not present

## 2019-12-08 DIAGNOSIS — M19011 Primary osteoarthritis, right shoulder: Secondary | ICD-10-CM | POA: Diagnosis not present

## 2019-12-08 DIAGNOSIS — G252 Other specified forms of tremor: Secondary | ICD-10-CM | POA: Diagnosis not present

## 2019-12-08 DIAGNOSIS — F331 Major depressive disorder, recurrent, moderate: Secondary | ICD-10-CM | POA: Diagnosis not present

## 2019-12-08 DIAGNOSIS — M170 Bilateral primary osteoarthritis of knee: Secondary | ICD-10-CM | POA: Diagnosis not present

## 2019-12-13 DIAGNOSIS — I10 Essential (primary) hypertension: Secondary | ICD-10-CM | POA: Diagnosis not present

## 2019-12-13 DIAGNOSIS — F331 Major depressive disorder, recurrent, moderate: Secondary | ICD-10-CM | POA: Diagnosis not present

## 2019-12-13 DIAGNOSIS — R5383 Other fatigue: Secondary | ICD-10-CM | POA: Diagnosis not present

## 2019-12-13 DIAGNOSIS — M19011 Primary osteoarthritis, right shoulder: Secondary | ICD-10-CM | POA: Diagnosis not present

## 2019-12-13 DIAGNOSIS — E039 Hypothyroidism, unspecified: Secondary | ICD-10-CM | POA: Diagnosis not present

## 2019-12-13 DIAGNOSIS — E785 Hyperlipidemia, unspecified: Secondary | ICD-10-CM | POA: Diagnosis not present

## 2019-12-13 DIAGNOSIS — M170 Bilateral primary osteoarthritis of knee: Secondary | ICD-10-CM | POA: Diagnosis not present

## 2019-12-13 DIAGNOSIS — G252 Other specified forms of tremor: Secondary | ICD-10-CM | POA: Diagnosis not present

## 2019-12-13 DIAGNOSIS — M19012 Primary osteoarthritis, left shoulder: Secondary | ICD-10-CM | POA: Diagnosis not present

## 2019-12-27 ENCOUNTER — Other Ambulatory Visit: Payer: Self-pay

## 2019-12-27 ENCOUNTER — Encounter: Payer: Self-pay | Admitting: Adult Health

## 2019-12-27 ENCOUNTER — Ambulatory Visit: Payer: Medicare HMO | Admitting: Adult Health

## 2019-12-27 VITALS — BP 147/76 | HR 82

## 2019-12-27 DIAGNOSIS — E785 Hyperlipidemia, unspecified: Secondary | ICD-10-CM | POA: Diagnosis not present

## 2019-12-27 DIAGNOSIS — Z5181 Encounter for therapeutic drug level monitoring: Secondary | ICD-10-CM

## 2019-12-27 DIAGNOSIS — H8113 Benign paroxysmal vertigo, bilateral: Secondary | ICD-10-CM | POA: Diagnosis not present

## 2019-12-27 DIAGNOSIS — I10 Essential (primary) hypertension: Secondary | ICD-10-CM | POA: Diagnosis not present

## 2019-12-27 DIAGNOSIS — R569 Unspecified convulsions: Secondary | ICD-10-CM | POA: Diagnosis not present

## 2019-12-27 DIAGNOSIS — Z8673 Personal history of transient ischemic attack (TIA), and cerebral infarction without residual deficits: Secondary | ICD-10-CM | POA: Diagnosis not present

## 2019-12-27 NOTE — Progress Notes (Signed)
I agree with the above plan 

## 2019-12-27 NOTE — Progress Notes (Signed)
[facility] [address] [place]. [place] [address] [phone]       OFFICE FOLLOW UP NOTE  [name] Date of Birth:  1924/01/26 Medical Record Number:  [id number]   Reason for Referral: Stroke follow up    SUBJECTIVE:   CHIEF COMPLAINT:  Chief Complaint  Patient presents with  . Follow-up    pt says she has been doing well. No new sx  . Cerebrovascular Accident  . New Room    HPI:   Today, 12/27/2019, [name] returns for follow-up visit unaccompanied.  Overall stable since prior visit without new or reoccurring stroke/TIA symptoms.  Remains on Plavix and pitavastatin for secondary stroke prevention without side effects.  Blood pressure today 147/76.  Remains on Depakote for possible seizure type activity with transient aphasia and denies any reoccurring aphasia type episodes.  Prior complaints of insomnia and increased sertraline dose from 25 mg daily to 50mg  daily. She does report sleeping better at night and tolerating increase dose well.  No concerns at this time.    History provided for reference purposes only Update 10/24/2019 JM: [name] returns for 4 month follow-up regarding episode of BPPV and incidental right hippocampal stroke finding in 01/2019.  She has been doing well since prior visit without reoccurring BPV type symptoms and denies new stroke/TIA symptoms.  Remains on clopidogrel and pitavastatin for secondary stroke prevention without side effects.  Blood pressure today 170/75 and on recheck 148/75.  Remains on amlodipine 5 mg daily.  Monitors at home and typically stable. Remains on Depakote for possible seizure activity in 02/2016.  Tolerating medication well without side effects and denies any reoccurring seizure type activity.  She does report increased insomnia and questions possible medication side effect She does admit to mind racing while laying in bed as well as increased anxiety and depression.  She continues to live on her  own but does have her son assist as needed She is currently on sertraline 25 mg daily tolerating dosage well without side effects  No further concerns at this time   hospital follow-up 06/14/2019 JM: [name] is being seen accompanied by her son for hospital follow-up.  She has been doing well since discharge without residual symptoms and complete resolution of BPPV symptoms.  Ongoing use of rolling walker due to gait impairment secondary to bilateral knee arthritis.  Denies any recent falls.  Continues on Plavix and pitavastatin for secondary stroke prevention.  Blood pressure today 153/78 and does monitor at home and typically SBP 140s.  No concerns at this time.  Stroke admission 01/26/2019: [name] is a 86 y.o. female with history of CVA, HTN, HLD who presented on 01/26/2019 with vertigo/dizziness.  Evaluated by stroke team and found to have incidental finding of right hippocampal infarct secondary to intracranial atherosclerosis however embolic source cannot be ruled out.  Likely symptoms due to BPPV as Dix-hallpike maneuver positive and recommend outpatient PT.  Recommended 30-day cardiac event monitor to rule out A. fib as potential etiology.  Recommended DAPT for 3 weeks then Plavix alone.  Prior history of TIA in 02/2014 and 02/2016.  HTN stable.  LDL 179 and recommended increasing home pitavastatin dosage.  Underlying seizure disorder with continuation of divalproex 500 mg daily.  Further evaluation by PT and recommended discharge to SNF.  BPPV, right posterior semi cannel   Dix-Hallpike maneuver showed on sitting up right upper beat nystagmus  Received canalith repositioning maneuver  PT on board  Recommend outpatient PT follow-up  Fall precaution  Stroke, incidental - R hippocampal infarct likely secondary to intracranial atherosclerosis. However, can not completely rule out embolic source.   MRI  R hippocampal infarct. Small vessel disease. Atrophy.   CTA head and neck  significant intracranial medium to small sized vessel  atherosclerosis including bilateral MCA branches and bilateral PCA branches  2D Echo EF 60 to 65%  LE venous doppler no DVT  EEG no seizure  Recommend 30 day monitoring as outpatient to rule out A. fib  LDL 179  HgbA1c 5.7  Lovenox 30 mg sq daily for VTE prophylaxis  clopidogrel 75 mg daily prior to admission, now on aspirin 81 mg daily and clopidogrel 75 mg daily. Continue DAPT x 3 weeks then back to plavix alone.   Therapy recommendations:  SNF therapy   ROS:   14 system review of systems performed and negative with exception of see HPI  PMH:  Past Medical History:  Diagnosis Date  . Hypercholesterolemia   . Hypertension   . Hypothyroidism   . Osteoarthritis   . Reactive depression (situational)   . Stroke ([place])   . TIA (transient ischemic attack)    on plavix    PSH:  Past Surgical History:  Procedure Laterality Date  . CATARACT EXTRACTION Left 1992  . CATARACT EXTRACTION Right 1995  . DEBRIDEMENT AND CLOSURE WOUND      Social History:  Social History   Socioeconomic History  . Marital status: Married    Spouse name: Not on file  . Number of children: 2  . Years of education: 36  . Highest education level: Not on file  Occupational History  . Not on file  Tobacco Use  . Smoking status: Never Smoker  . Smokeless tobacco: Never Used  Substance and Sexual Activity  . Alcohol use: No    Alcohol/week: 0.0 standard drinks  . Drug use: No  . Sexual activity: Not on file  Other Topics Concern  . Not on file  Social History Narrative   Lives at home alone.    Has 2 children.   Caffeine: occasional    Social Determinants of Radio broadcast assistant Strain:   . Difficulty of Paying Living Expenses: Not on file  Food Insecurity: No Food Insecurity  . Worried About Charity fundraiser in the Last Year: Never true  . Ran Out of Food in the Last Year: Never true  Transportation Needs: No  Transportation Needs  . Lack of Transportation (Medical): No  . Lack of Transportation (Non-Medical): No  Physical Activity:   . Days of Exercise per Week: Not on file  . Minutes of Exercise per Session: Not on file  Stress:   . Feeling of Stress : Not on file  Social Connections:   . Frequency of Communication with Friends and Family: Not on file  . Frequency of Social Gatherings with Friends and Family: Not on file  . Attends Religious Services: Not on file  . Active Member of Clubs or Organizations: Not on file  . Attends Archivist Meetings: Not on file  . Marital Status: Not on file  Intimate Partner Violence:   . Fear of Current or Ex-Partner: Not on file  . Emotionally Abused: Not on file  . Physically Abused: Not on file  . Sexually Abused: Not on file    Family History:  Family History  Problem Relation Age of Onset  . Heart failure Mother   .  Pneumonia Mother   . Heart failure Father     Medications:   Current Outpatient Medications on File Prior to Visit  Medication Sig Dispense Refill  . acetaminophen (TYLENOL) 325 MG tablet Take 325 mg by mouth every 6 (six) hours as needed for mild pain.     Marland Kitchen amLODipine (NORVASC) 5 MG tablet Take 5 mg by mouth daily.    . Betamethasone Sodium Phosphate 6 MG/ML SOLN INJECT 2:4 SYRINGE BILATERALLY INTO EACH KNEE JOINT    . Calcium Carb-Cholecalciferol (CALCIUM 600/VITAMIN D3) 600-800 MG-UNIT TABS Take 1 tablet by mouth 2 (two) times daily.     . clopidogrel (PLAVIX) 75 MG tablet Take 1 tablet (75 mg total) by mouth daily. 30 tablet 3  . divalproex (DEPAKOTE ER) 500 MG 24 hr tablet Take 1 tablet (500 mg total) by mouth daily. 90 tablet 3  . levothyroxine (SYNTHROID, LEVOTHROID) 100 MCG tablet TAKE 1 TABLET (100 MCG TOTAL) BY MOUTH DAILY. TAKING 6 DAYS A WEEK. DON'T TAKE ON SUNDAYS (Patient taking differently: Take 100 mcg by mouth See admin instructions. Taking Daily except on Sunday,) 90 tablet 0  . Multiple Vitamin  (MULTIVITAMIN WITH MINERALS) TABS tablet Take 1 tablet by mouth daily.    . pantoprazole (PROTONIX) 40 MG tablet Take 40 mg by mouth daily.    . Pitavastatin Calcium (LIVALO) 2 MG TABS Take 2 tablets (4 mg total) by mouth once a week. 10 tablet 3  . sertraline (ZOLOFT) 50 MG tablet Take 1 tablet (50 mg total) by mouth daily. 90 tablet 3   No current facility-administered medications on file prior to visit.    Allergies:   Allergies  Allergen Reactions  . Escitalopram Oxalate Nausea Only  . Lisinopril Other (See Comments)    Very decreased blood pressure   . Statins     myalgias  . Sulfa Antibiotics     hives      OBJECTIVE:  Physical Exam  Vitals:   12/27/19 1350  BP: (!) 147/76  Pulse: 82   There is no height or weight on file to calculate BMI. No exam data present  General: Frail very pleasant elderly Caucasian female, seated in wheelchair, in no evident distress Head: head normocephalic and atraumatic.   Neck: supple with no carotid or supraclavicular bruits Cardiovascular: regular rate and rhythm, no murmurs Musculoskeletal: no deformity Skin:  no rash/petichiae Vascular:  Normal pulses all extremities   Neurologic Exam Mental Status: Awake and fully alert.  Fluent speech and language. Oriented to place and time. Recent and remote memory intact. Attention span, concentration and fund of knowledge appropriate. Mood and affect appropriate.  Cranial Nerves: Pupils equal, briskly reactive to light. Extraocular movements full without nystagmus. Visual fields full to confrontation.  HOH bilaterally. Facial sensation intact. Face, tongue, palate moves normally and symmetrically.  Motor: Normal bulk and tone. Normal strength in all tested extremity muscles. Sensory.: intact to touch , pinprick , position and vibratory sensation.  Coordination: Rapid alternating movements normal in all extremities. Finger-to-nose and heel-to-shin performed accurately bilaterally. Gait and  Station: Deferred as rolling walker not present during visit Reflexes: 1+ and symmetric. Toes downgoing.        ASSESSMENT/PLAN: Danielle Bryan is a 86 y.o. year old female presented with vertigo/dizziness on 01/26/2019 likely due to BPPV with incidental right hippocampal infarct secondary to intracranial arthrosclerosis. Vascular risk factors include HTN, HLD, intracranial atherosclerosis and prior TIA.      1. R hippocampal stroke:  a. Recovered without  residual deficits b. Continue clopidogrel 75 mg daily  and pitavastatin  for secondary stroke prevention.   c. Discussed secondary stroke prevention measures and importance of close PCP follow-up for aggressive stroke risk factor management  d. Declined 30-day cardiac event monitor  2. Insomnia: a. Chronic issue with recent improvement after increasing sertraline dosage to 50 mg daily.  Advised to continue current dosage and continue to follow with PCP for ongoing monitoring and prescribing 3. BPPV:  a. Stable.   b. No reoccurrence 4. Seizure type activity:  a. Presented with speech difficulties in 02/2016 diagnosed with L brain TIA vs seizure.   b. Reported prior occurrence and speech difficulty therefore concern for seizure type activity.   c. Remains on Depakote ER 500mg  nightly for seizure prophylaxis.   d. No reoccurring seizure type activity or events e. Needs valproic acid level checked -plans on obtaining lab work in the near future with PCP therefore provided a printout to obtain valproic acid level at that time 5. HTN:  a. BP goal<130/90.  Slightly elevated today and encouraged monitoring at home b. Managed by PCP. 6. HLD:  a. LDL goal<70.  Recent lab work by PCP per patient.  Unable to personally view via epic. b. Continue pitavastatin per PCP    Follow up in 6 months or call earlier if needed  CC:  [facility] provider: Dr. Morey Hummingbird, Nicki Reaper, MD     I spent 25 minutes of face-to-face and non-face-to-face time with  patient.  This included previsit chart review, lab review, study review, order entry, electronic health record documentation, patient education regarding concerns of insomnia And Potential Causes, history of stroke, possible seizure activity, importance of managing stroke risk factors and answered all questions to patient satisfaction   Frann Rider, [facility]  [facility] [address] [place], [place] [address]  Phone [phone] Fax [phone] Note: This document was prepared with digital dictation and possible smart phrase technology. Any transcriptional errors that result from this process are unintentional.

## 2019-12-27 NOTE — Patient Instructions (Addendum)
Continue Depakote ER 500mg  daily for seizure prevention Request you get depakote level (or valproic acid level) completed when you get your next lab work completed with your PCP  Continue increased dose of sertraline as this has been helpful regarding your insomnia - ongoing management can be done by your PCP  Continue clopidogrel 75 mg daily  and pitavastatin  for secondary stroke prevention  Continue to follow up with PCP regarding cholesterol and blood pressure management  Maintain strict control of hypertension with blood pressure goal below 130/90 and cholesterol with LDL cholesterol (bad cholesterol) goal below 70 mg/dL.     Followup in the future with me in 6 months or call earlier if needed     Thank you for coming to see [place] at [facility]. I hope we have been able to provide you high quality care today.  You may receive a patient satisfaction survey over the next few weeks. We would appreciate your feedback and comments so that we may continue to improve ourselves and the health of our patients.

## 2020-01-25 ENCOUNTER — Other Ambulatory Visit: Payer: Self-pay

## 2020-01-25 NOTE — Patient Outreach (Addendum)
[facility]) Care Management  01/25/2020  Danielle Bryan 1924-02-13 [id number]   Telephone Assessment Annual Assessment Quarterly Call    Outreach attempt #1 to patient. Spoke with patient who reports she is doing fairly well. She denies any acute issues or concerns a at present. She continues to reside in her home. She has paid caregivers in the home as well as supportive son who assists as needed. She denies any recent cent falls. Patient continues to be ambulatory with use of walker. She reports that she goes later this week for her injections to manage arthritis. She denies any pain at present. Appetite remains good. She voices she is eating about 3x/day. She is unsure of wgt but has scale in the home. RN CM  encouraged patient to have caregiver assist with weighing more frequently. She voiced understanding. Patient went for neuro follow up appt earlier this month. She denies any RN CM needs or concerns at this time.    Medications Reviewed Today    Reviewed by [name], RN (Registered Nurse) on 01/25/20 at 27  Med List Status: <None>  Medication Order Taking? Sig Documenting Provider Last Dose Status Informant  acetaminophen (TYLENOL) 325 MG tablet [id number] No Take 325 mg by mouth every 6 (six) hours as needed for mild pain.  [provider] Taking Active Child           Med Note [name], [name] Mar 18, 2015  1:53 AM)    amLODipine (NORVASC) 5 MG tablet [id number] No Take 5 mg by mouth daily. [provider] Taking Active Child  Betamethasone Sodium Phosphate 6 MG/ML SOLN [id number] No INJECT 2:4 SYRINGE BILATERALLY INTO [facility] KNEE JOINT [provider] Taking Active   Calcium Carb-Cholecalciferol (CALCIUM 600/VITAMIN D3) 600-800 MG-UNIT TABS [id number] No Take 1 tablet by mouth 2 (two) times daily.  [provider] Taking Active Child  clopidogrel (PLAVIX) 75 MG tablet [id number] No Take 1 tablet (75 mg  total) by mouth daily. Velna Hatchet, MD Taking Active Child  divalproex (DEPAKOTE ER) 500 MG 24 hr tablet [id number] No Take 1 tablet (500 mg total) by mouth daily. Frann Rider, NP Taking Active   levothyroxine (SYNTHROID, LEVOTHROID) 100 MCG tablet [id number] No TAKE 1 TABLET (100 MCG TOTAL) BY MOUTH DAILY. TAKING 6 DAYS A WEEK. DON'T TAKE ON SUNDAYS  Patient taking differently: Take 100 mcg by mouth See admin instructions. Taking Daily except on Sunday,   Darlin Coco, MD Taking Active Child  Multiple Vitamin (MULTIVITAMIN WITH MINERALS) TABS tablet [id number] No Take 1 tablet by mouth daily. [provider] Taking Active Child  pantoprazole (PROTONIX) 40 MG tablet [id number] No Take 40 mg by mouth daily. [provider] Taking Active Child  Pitavastatin Calcium (LIVALO) 2 MG TABS [id number] No Take 2 tablets (4 mg total) by mouth once a week. Sheikh, Omair Edgerton, DO Taking Active   sertraline (ZOLOFT) 50 MG tablet [id number] No Take 1 tablet (50 mg total) by mouth daily. Frann Rider, NP Taking Active          Fall Risk  01/25/2020 10/26/2019 08/30/2019 07/18/2019 02/28/2019  Falls in the past year? 0 0 1 1 1   Number falls in past yr: 0 0 1 1 1   Injury with Fall? 0 0 0 1 1  Risk for fall due to : Impaired balance/gait;Impaired vision;Medication side effect;Impaired mobility Impaired balance/gait;Impaired mobility;Impaired vision;Medication side effect History of fall(s);Impaired balance/gait;Impaired mobility;Medication side effect;Impaired vision History of fall(s);Impaired balance/gait;Impaired  mobility;Medication side effect;Impaired vision History of fall(s);Impaired balance/gait;Impaired mobility;Medication side effect;Impaired vision  Follow up Education provided;Falls prevention discussed;Falls evaluation completed Falls evaluation completed;Education provided;Falls prevention discussed Falls evaluation completed;Education provided Education provided;Falls prevention  discussed;Falls evaluation completed Falls evaluation completed;Falls prevention discussed;Education provided   Depression screen [facility] 2/9 01/25/2020 10/24/2019 02/28/2019  Decreased Interest 0 0 0  Down, Depressed, Hopeless 0 1 0  PHQ - 2 Score 0 1 0  Altered sleeping - 1 -  Tired, decreased energy - 0 -  Change in appetite - 0 -  Feeling bad or failure about yourself  - 1 -  Trouble concentrating - 0 -  Moving slowly or fidgety/restless - 1 -  Suicidal thoughts - 0 -  PHQ-9 Score - 4 -   SDOH Screenings   Alcohol Screen:   . Last Alcohol Screening Score (AUDIT): Not on file  Depression (PHQ2-9): Low Risk   . PHQ-2 Score: 0  Financial Resource Strain:   . Difficulty of Paying Living Expenses: Not on file  Food Insecurity: No Food Insecurity  . Worried About Charity fundraiser in the Last Year: Never true  . Ran Out of Food in the Last Year: Never true  Housing: Low Risk   . Last Housing Risk Score: 0  Physical Activity:   . Days of Exercise per Week: Not on file  . Minutes of Exercise per Session: Not on file  Social Connections:   . Frequency of Communication with Friends and Family: Not on file  . Frequency of Social Gatherings with Friends and Family: Not on file  . Attends Religious Services: Not on file  . Active Member of Clubs or Organizations: Not on file  . Attends Archivist Meetings: Not on file  . Marital Status: Not on file  Stress:   . Feeling of Stress : Not on file  Tobacco Use: Low Risk   . Smoking Tobacco Use: Never Smoker  . Smokeless Tobacco Use: Never Used  Transportation Needs: No Transportation Needs  . Lack of Transportation (Medical): No  . Lack of Transportation (Non-Medical): No    Goals Addressed            This Visit's Progress   . Keep or Improve My Strength       Follow Up Date 05/23/2020   - arrange in-home help services - eat healthy to increase strength - know who to call for help if I fall - learn how to get up if I  fall - plan activity for times when energy is the highest    Why is this important?    Before the stroke you probably did not think much about being safe when you are up and about.   Now, it may be harder for you to get around.   It may also be easier for you to trip or fall.   It is common to have muscle weakness after a stroke. You may also feel like you cannot control an arm or leg.   It will be helpful to work with a physical therapist to get your strength and muscle control back.   It is good to stay as active as you can. Walking and stretching help you stay strong and flexible.   The physical therapist will develop an exercise program just for you.     Notes:     . Plan for [place]       Follow Up Date 05/23/2020   -  list the symptoms that would make staying at home too hard - make a list future care and financial needs - make a list of people who can help and what they can do    Why is this important?    Having and recovering from a stroke can be scary and stressful.   You can reduce stress by planning.   Preparing for the future is one of the most important things to do.   Thinking about how much care you/your loved one will need and how much it will cost is not easy.   You/your loved one can be part of making decisions for the future.   Making sure that your/your loved one's wishes for care are known is important.     Notes:       Plan: RN CM discussed with patient next outreach within the month of March. Patient gave verbal consent and in agreement with RN CM follow up and timeframe. Patient aware that they may contact RN CM sooner for any issues or concerns. RN CM will send quarterly update to PCP.   Enzo Montgomery, RN,BSN,CCM [place] Management Telephonic Care Management Coordinator Direct Phone: [phone] Toll Free: [phone] Fax: [phone]

## 2020-01-26 ENCOUNTER — Ambulatory Visit: Payer: Self-pay

## 2020-01-28 DIAGNOSIS — M19012 Primary osteoarthritis, left shoulder: Secondary | ICD-10-CM | POA: Diagnosis not present

## 2020-01-28 DIAGNOSIS — M25512 Pain in left shoulder: Secondary | ICD-10-CM | POA: Diagnosis not present

## 2020-01-28 DIAGNOSIS — M19011 Primary osteoarthritis, right shoulder: Secondary | ICD-10-CM | POA: Diagnosis not present

## 2020-01-28 DIAGNOSIS — M25511 Pain in right shoulder: Secondary | ICD-10-CM | POA: Diagnosis not present

## 2020-02-13 DIAGNOSIS — M25562 Pain in left knee: Secondary | ICD-10-CM | POA: Diagnosis not present

## 2020-02-13 DIAGNOSIS — M170 Bilateral primary osteoarthritis of knee: Secondary | ICD-10-CM | POA: Diagnosis not present

## 2020-02-13 DIAGNOSIS — M25561 Pain in right knee: Secondary | ICD-10-CM | POA: Diagnosis not present

## 2020-04-13 DIAGNOSIS — E785 Hyperlipidemia, unspecified: Secondary | ICD-10-CM | POA: Diagnosis not present

## 2020-04-13 DIAGNOSIS — R7309 Other abnormal glucose: Secondary | ICD-10-CM | POA: Diagnosis not present

## 2020-04-13 DIAGNOSIS — E039 Hypothyroidism, unspecified: Secondary | ICD-10-CM | POA: Diagnosis not present

## 2020-04-13 DIAGNOSIS — M810 Age-related osteoporosis without current pathological fracture: Secondary | ICD-10-CM | POA: Diagnosis not present

## 2020-04-19 ENCOUNTER — Other Ambulatory Visit: Payer: Self-pay | Admitting: Adult Health

## 2020-04-20 DIAGNOSIS — L309 Dermatitis, unspecified: Secondary | ICD-10-CM | POA: Diagnosis not present

## 2020-04-20 DIAGNOSIS — E039 Hypothyroidism, unspecified: Secondary | ICD-10-CM | POA: Diagnosis not present

## 2020-04-20 DIAGNOSIS — R82998 Other abnormal findings in urine: Secondary | ICD-10-CM | POA: Diagnosis not present

## 2020-04-20 DIAGNOSIS — E785 Hyperlipidemia, unspecified: Secondary | ICD-10-CM | POA: Diagnosis not present

## 2020-04-20 DIAGNOSIS — I10 Essential (primary) hypertension: Secondary | ICD-10-CM | POA: Diagnosis not present

## 2020-04-20 DIAGNOSIS — F331 Major depressive disorder, recurrent, moderate: Secondary | ICD-10-CM | POA: Diagnosis not present

## 2020-04-20 DIAGNOSIS — D692 Other nonthrombocytopenic purpura: Secondary | ICD-10-CM | POA: Diagnosis not present

## 2020-04-20 DIAGNOSIS — Z0000 Encounter for general adult medical examination without abnormal findings: Secondary | ICD-10-CM | POA: Diagnosis not present

## 2020-04-20 DIAGNOSIS — M810 Age-related osteoporosis without current pathological fracture: Secondary | ICD-10-CM | POA: Diagnosis not present

## 2020-04-20 DIAGNOSIS — Z1339 Encounter for screening examination for other mental health and behavioral disorders: Secondary | ICD-10-CM | POA: Diagnosis not present

## 2020-04-20 DIAGNOSIS — R296 Repeated falls: Secondary | ICD-10-CM | POA: Diagnosis not present

## 2020-04-20 DIAGNOSIS — Z1331 Encounter for screening for depression: Secondary | ICD-10-CM | POA: Diagnosis not present

## 2020-04-25 ENCOUNTER — Other Ambulatory Visit: Payer: Self-pay

## 2020-04-25 NOTE — Patient Outreach (Signed)
[facility]) Care Management  04/25/2020  Danielle Bryan [birth year] [id number]   Telephone Assessment Quarterly call  Outreach attempt # 1 to patient. Spoke with both patient and caregiver/aide as patient HOH and was having some difficulty hearing Therapist, sports. She denies any acute issues or concerns at present. She went last week for annual wellness visit with Md and states appt went well. Patient continues to have caregivers in the home throughout the day/evening to assist her. Son is supportive and nearby and abel to assist her as well. She is ambulatory with walker. No recent falls. Appetite remains good-eating three meals/day Wgt stable. She denies any RN CM needs or concerns at this time.  Medications Reviewed Today    Reviewed by [name], RN (Registered Nurse) on 04/25/20 at 1145  Med List Status: <None>  Medication Order Taking? Sig Documenting Provider Last Dose Status Informant  acetaminophen (TYLENOL) 325 MG tablet [id number] No Take 325 mg by mouth every 6 (six) hours as needed for mild pain.  [provider] Taking Active Child           Med Note [name], [name] Mar 18, 2015  1:53 AM)    amLODipine (NORVASC) 5 MG tablet [id number] No Take 5 mg by mouth daily. [provider] Taking Active Child  Betamethasone Sodium Phosphate 6 MG/ML SOLN [id number] No INJECT 2:4 SYRINGE BILATERALLY INTO [facility] KNEE JOINT [provider] Taking Active   Calcium Carb-Cholecalciferol (CALCIUM 600/VITAMIN D3) 600-800 MG-UNIT TABS [id number] No Take 1 tablet by mouth 2 (two) times daily.  [provider] Taking Active Child  clopidogrel (PLAVIX) 75 MG tablet [id number] No Take 1 tablet (75 mg total) by mouth daily. Velna Hatchet, MD Taking Active Child  divalproex (DEPAKOTE ER) 500 MG 24 hr tablet [id number]  TAKE 1 TABLET BY MOUTH EVERY DAY McCue, Jessica, NP  Active   levothyroxine (SYNTHROID, LEVOTHROID) 100 MCG tablet [id number] No  TAKE 1 TABLET (100 MCG TOTAL) BY MOUTH DAILY. TAKING 6 DAYS A WEEK. DON'T TAKE ON SUNDAYS  Patient taking differently: Take 100 mcg by mouth See admin instructions. Taking Daily except on Sunday,   Darlin Coco, MD Taking Active Child  Multiple Vitamin (MULTIVITAMIN WITH MINERALS) TABS tablet [id number] No Take 1 tablet by mouth daily. [provider] Taking Active Child  pantoprazole (PROTONIX) 40 MG tablet [id number] No Take 40 mg by mouth daily. [provider] Taking Active Child  Pitavastatin Calcium (LIVALO) 2 MG TABS [id number] No Take 2 tablets (4 mg total) by mouth once a week. Sheikh, Omair Orinda, DO Taking Active   sertraline (ZOLOFT) 50 MG tablet [id number] No Take 1 tablet (50 mg total) by mouth daily. Frann Rider, NP Taking Active           Fall Risk  04/25/2020 01/25/2020 10/26/2019 08/30/2019 07/18/2019  Falls in the past year? 0 0 0 1 1  Number falls in past yr: 0 0 0 1 1  Injury with Fall? 0 0 0 0 1  Risk for fall due to : Impaired balance/gait;Impaired mobility;Medication side effect Impaired balance/gait;Impaired vision;Medication side effect;Impaired mobility Impaired balance/gait;Impaired mobility;Impaired vision;Medication side effect History of fall(s);Impaired balance/gait;Impaired mobility;Medication side effect;Impaired vision History of fall(s);Impaired balance/gait;Impaired mobility;Medication side effect;Impaired vision  Follow up Education provided;Falls evaluation completed Education provided;Falls prevention discussed;Falls evaluation completed Falls evaluation completed;Education provided;Falls prevention discussed Falls evaluation completed;Education provided Education provided;Falls prevention discussed;Falls evaluation completed   Goals Addressed  This Visit's Progress   .  (THN)Keep or Improve My Strength (pt-stated)        Timeframe:  Long-Range Goal Priority:  High Start Date: 04/25/2020                            Expected  End Date:   June 30,2022                   Follow Up Date June 2022   - eat healthy to increase strength - increase activity or exercise time a little every week    Why is this important?    Before the stroke you probably did not think much about being safe when you are up and about.   Now, it may be harder for you to get around.   It may also be easier for you to trip or fall.   It is common to have muscle weakness after a stroke. You may also feel like you cannot control an arm or leg.   It will be helpful to work with a physical therapist to get your strength and muscle control back.   It is good to stay as active as you can. Walking and stretching help you stay strong and flexible.   The physical therapist will develop an exercise program just for you.     Notes:  04/25/20-Patient continue to be fairly mobile-ambulating with walker. No recent falls. Safety measures in place along with paid caregivers throughout the day.    .  COMPLETED: (THN)Plan for Long Term Care (pt-stated)        Timeframe:  Long-Range Goal Priority:  Medium Start Date:   01/25/2020                          Expected End Date:                      Follow Up Date 05/23/2020   - list the symptoms that would make staying at home too hard - make a list future care and financial needs - make a list of people who can help and what they can do    Why is this important?    Having and recovering from a stroke can be scary and stressful.   You can reduce stress by planning.   Preparing for the future is one of the most important things to do.   Thinking about how much care you/your loved one will need and how much it will cost is not easy.   You/your loved one can be part of making decisions for the future.   Making sure that your/your loved one's wishes for care are known is important.     Notes:        Plan: RN CM discussed with patient and caregiver next outreach within the month of June. Patient  gave verbal consent and in agreement with RN CM follow up and timeframe. Patient aware that they may contact RN CM sooner for any issues or concerns. RN CM reviewed goals and plan of care with patient. Patient in agreement.  RN CM will send quarterly update to PCP.   Enzo Montgomery, RN,BSN,CCM [facility] Telephonic Care Management Coordinator Direct Phone: [phone] Toll Free: [phone] Fax: [phone]

## 2020-04-26 ENCOUNTER — Ambulatory Visit: Payer: Self-pay

## 2020-05-01 DIAGNOSIS — C44519 Basal cell carcinoma of skin of other part of trunk: Secondary | ICD-10-CM | POA: Diagnosis not present

## 2020-05-01 DIAGNOSIS — L820 Inflamed seborrheic keratosis: Secondary | ICD-10-CM | POA: Diagnosis not present

## 2020-05-01 DIAGNOSIS — D485 Neoplasm of uncertain behavior of skin: Secondary | ICD-10-CM | POA: Diagnosis not present

## 2020-05-01 DIAGNOSIS — L300 Nummular dermatitis: Secondary | ICD-10-CM | POA: Diagnosis not present

## 2020-05-01 DIAGNOSIS — C44712 Basal cell carcinoma of skin of right lower limb, including hip: Secondary | ICD-10-CM | POA: Diagnosis not present

## 2020-05-01 DIAGNOSIS — Z85828 Personal history of other malignant neoplasm of skin: Secondary | ICD-10-CM | POA: Diagnosis not present

## 2020-05-19 DIAGNOSIS — M170 Bilateral primary osteoarthritis of knee: Secondary | ICD-10-CM | POA: Diagnosis not present

## 2020-05-19 DIAGNOSIS — M19012 Primary osteoarthritis, left shoulder: Secondary | ICD-10-CM | POA: Diagnosis not present

## 2020-05-19 DIAGNOSIS — M19011 Primary osteoarthritis, right shoulder: Secondary | ICD-10-CM | POA: Diagnosis not present

## 2020-05-29 DIAGNOSIS — C44519 Basal cell carcinoma of skin of other part of trunk: Secondary | ICD-10-CM | POA: Diagnosis not present

## 2020-05-29 DIAGNOSIS — Z85828 Personal history of other malignant neoplasm of skin: Secondary | ICD-10-CM | POA: Diagnosis not present

## 2020-05-29 DIAGNOSIS — D0471 Carcinoma in situ of skin of right lower limb, including hip: Secondary | ICD-10-CM | POA: Diagnosis not present

## 2020-06-16 DIAGNOSIS — M67912 Unspecified disorder of synovium and tendon, left shoulder: Secondary | ICD-10-CM | POA: Diagnosis not present

## 2020-06-16 DIAGNOSIS — M25561 Pain in right knee: Secondary | ICD-10-CM | POA: Diagnosis not present

## 2020-06-16 DIAGNOSIS — M25562 Pain in left knee: Secondary | ICD-10-CM | POA: Diagnosis not present

## 2020-06-16 DIAGNOSIS — M67911 Unspecified disorder of synovium and tendon, right shoulder: Secondary | ICD-10-CM | POA: Diagnosis not present

## 2020-06-28 ENCOUNTER — Encounter: Payer: Self-pay | Admitting: Adult Health

## 2020-06-28 ENCOUNTER — Ambulatory Visit (INDEPENDENT_AMBULATORY_CARE_PROVIDER_SITE_OTHER): Payer: Medicare HMO | Admitting: Adult Health

## 2020-06-28 VITALS — BP 158/72 | HR 82 | Ht 61.0 in | Wt 125.0 lb

## 2020-06-28 DIAGNOSIS — G4700 Insomnia, unspecified: Secondary | ICD-10-CM

## 2020-06-28 DIAGNOSIS — R569 Unspecified convulsions: Secondary | ICD-10-CM | POA: Diagnosis not present

## 2020-06-28 DIAGNOSIS — Z5181 Encounter for therapeutic drug level monitoring: Secondary | ICD-10-CM | POA: Diagnosis not present

## 2020-06-28 DIAGNOSIS — H8113 Benign paroxysmal vertigo, bilateral: Secondary | ICD-10-CM | POA: Diagnosis not present

## 2020-06-28 DIAGNOSIS — Z8673 Personal history of transient ischemic attack (TIA), and cerebral infarction without residual deficits: Secondary | ICD-10-CM | POA: Diagnosis not present

## 2020-06-28 MED ORDER — SERTRALINE HCL 50 MG PO TABS: 50.0000 mg | ORAL_TABLET | Freq: Every day | ORAL | 3 refills | Status: AC

## 2020-06-28 NOTE — Patient Instructions (Addendum)
Your Plan:  Continue Depakote ER 500mg  daily for seizure prevention   We will check blood work today     Follow up in 6 months or call earlier if needed     Thank you for coming to see [place] at [facility]. I hope we have been able to provide you high quality care today.  You may receive a patient satisfaction survey over the next few weeks. We would appreciate your feedback and comments so that we may continue to improve ourselves and the health of our patients.

## 2020-06-28 NOTE — Progress Notes (Signed)
[facility] [address] [place]. [place] [address] [phone]       OFFICE FOLLOW UP NOTE  [name] Date of Birth:  [birth year] Medical Record Number:  [id number]   Reason for Referral: Stroke follow up    SUBJECTIVE:   CHIEF COMPLAINT:  Chief Complaint  Patient presents with  . Follow-up    RM 14 with Danielle Bryan (son) Pt is well and stable, no new symptoms     HPI:   Today, 06/28/2020, [name] returns for 31-month stroke follow-up visit accompanied by her son, Danielle Bryan.  Doing well since prior visit without new or reoccurring stroke/TIA symptoms Reports compliance on Plavix and pitavastatin without associated side effects Blood pressure today 158/72 - occasionally monitors at home and typically 130s Does have care giver aides that come in the morning and evening. Continues to use RW for ambulation - denies any recent falls.   Remains on Depakote tolerating without any reoccurring transient aphasia or seizure-like activity  Remains on sertraline for insomnia with continued benefit reporting she has been sleeping well without difficulty.  Denies depression or anxiety symptoms  No new concerns at this time    History provided for reference purposes only Update 12/27/2019 JM: [name] returns for follow-up visit unaccompanied.  Overall stable since prior visit without new or reoccurring stroke/TIA symptoms.  Remains on Plavix and pitavastatin for secondary stroke prevention without side effects.  Blood pressure today 147/76.  Remains on Depakote for possible seizure type activity with transient aphasia and denies any reoccurring aphasia type episodes.  Prior complaints of insomnia and increased sertraline dose from 25 mg daily to 50mg  daily. She does report sleeping better at night and tolerating increase dose well.  No concerns at this time.  Update 10/24/2019 JM: [name] returns for 4 month follow-up regarding episode of BPPV and incidental right  hippocampal stroke finding in 01/2019.  She has been doing well since prior visit without reoccurring BPV type symptoms and denies new stroke/TIA symptoms.  Remains on clopidogrel and pitavastatin for secondary stroke prevention without side effects.  Blood pressure today 170/75 and on recheck 148/75.  Remains on amlodipine 5 mg daily.  Monitors at home and typically stable. Remains on Depakote for possible seizure activity in 02/2016.  Tolerating medication well without side effects and denies any reoccurring seizure type activity.  She does report increased insomnia and questions possible medication side effect She does admit to mind racing while laying in bed as well as increased anxiety and depression.  She continues to live on her own but does have her son assist as needed She is currently on sertraline 25 mg daily tolerating dosage well without side effects  No further concerns at this time   hospital follow-up 06/14/2019 JM: [name] is being seen accompanied by her son for hospital follow-up.  She has been doing well since discharge without residual symptoms and complete resolution of BPPV symptoms.  Ongoing use of rolling walker due to gait impairment secondary to bilateral knee arthritis.  Denies any recent falls.  Continues on Plavix and pitavastatin for secondary stroke prevention.  Blood pressure today 153/78 and does monitor at home and typically SBP 140s.  No concerns at this time.  Stroke admission 01/26/2019: [name] is a 87 y.o. female with history of CVA, HTN, HLD who presented on 01/26/2019 with vertigo/dizziness.  Evaluated by stroke team and found to have incidental finding of right hippocampal infarct secondary to intracranial atherosclerosis  however embolic source cannot be ruled out.  Likely symptoms due to BPPV as Dix-hallpike maneuver positive and recommend outpatient PT.  Recommended 30-day cardiac event monitor to rule out A. fib as potential etiology.  Recommended DAPT  for 3 weeks then Plavix alone.  Prior history of TIA in 02/2014 and 02/2016.  HTN stable.  LDL 179 and recommended increasing home pitavastatin dosage.  Underlying seizure disorder with continuation of divalproex 500 mg daily.  Further evaluation by PT and recommended discharge to SNF.  BPPV, right posterior semi cannel   Dix-Hallpike maneuver showed on sitting up right upper beat nystagmus  Received canalith repositioning maneuver  PT on board  Recommend outpatient PT follow-up  Fall precaution  Stroke, incidental - R hippocampal infarct likely secondary to intracranial atherosclerosis. However, can not completely rule out embolic source.   MRI  R hippocampal infarct. Small vessel disease. Atrophy.   CTA head and neck significant intracranial medium to small sized vessel  atherosclerosis including bilateral MCA branches and bilateral PCA branches  2D Echo EF 60 to 65%  LE venous doppler no DVT  EEG no seizure  Recommend 30 day monitoring as outpatient to rule out A. fib  LDL 179  HgbA1c 5.7  Lovenox 30 mg sq daily for VTE prophylaxis  clopidogrel 75 mg daily prior to admission, now on aspirin 81 mg daily and clopidogrel 75 mg daily. Continue DAPT x 3 weeks then back to plavix alone.   Therapy recommendations:  SNF therapy   ROS:   14 system review of systems performed and negative with exception of see HPI  PMH:  Past Medical History:  Diagnosis Date  . Hypercholesterolemia   . Hypertension   . Hypothyroidism   . Osteoarthritis   . Reactive depression (situational)   . Stroke ([place])   . TIA (transient ischemic attack)    on plavix    PSH:  Past Surgical History:  Procedure Laterality Date  . CATARACT EXTRACTION Left 1992  . CATARACT EXTRACTION Right 1995  . DEBRIDEMENT AND CLOSURE WOUND      Social History:  Social History   Socioeconomic History  . Marital status: Widowed    Spouse name: Not on file  . Number of children: 2  . Years of education:  68  . Highest education level: Not on file  Occupational History  . Not on file  Tobacco Use  . Smoking status: Never Smoker  . Smokeless tobacco: Never Used  Substance and Sexual Activity  . Alcohol use: No    Alcohol/week: 0.0 standard drinks  . Drug use: No  . Sexual activity: Not on file  Other Topics Concern  . Not on file  Social History Narrative   Lives at home alone.    Has 2 children.   Caffeine: occasional    Social Determinants of Radio broadcast assistant Strain: Not on file  Food Insecurity: No Food Insecurity  . Worried About Charity fundraiser in the Last Year: Never true  . Ran Out of Food in the Last Year: Never true  Transportation Needs: No Transportation Needs  . Lack of Transportation (Medical): No  . Lack of Transportation (Non-Medical): No  Physical Activity: Not on file  Stress: Not on file  Social Connections: Not on file  Intimate Partner Violence: Not on file    Family History:  Family History  Problem Relation Age of Onset  . Heart failure Mother   . Pneumonia Mother   . Heart failure  Father     Medications:   Current Outpatient Medications on File Prior to Visit  Medication Sig Dispense Refill  . acetaminophen (TYLENOL) 325 MG tablet Take 325 mg by mouth every 6 (six) hours as needed for mild pain.     Marland Kitchen amLODipine (NORVASC) 5 MG tablet Take 5 mg by mouth daily.    . Betamethasone Sodium Phosphate 6 MG/ML SOLN INJECT 2:4 SYRINGE BILATERALLY INTO EACH KNEE JOINT    . Calcium Carb-Cholecalciferol 600-800 MG-UNIT TABS Take 1 tablet by mouth 2 (two) times daily.     . clopidogrel (PLAVIX) 75 MG tablet Take 1 tablet (75 mg total) by mouth daily. 30 tablet 3  . divalproex (DEPAKOTE ER) 500 MG 24 hr tablet TAKE 1 TABLET BY MOUTH EVERY DAY 90 tablet 3  . levothyroxine (SYNTHROID, LEVOTHROID) 100 MCG tablet TAKE 1 TABLET (100 MCG TOTAL) BY MOUTH DAILY. TAKING 6 DAYS A WEEK. DON'T TAKE ON SUNDAYS (Patient taking differently: Take 100 mcg by  mouth See admin instructions. Taking Daily except on Sunday,) 90 tablet 0  . Multiple Vitamin (MULTIVITAMIN WITH MINERALS) TABS tablet Take 1 tablet by mouth daily.    . pantoprazole (PROTONIX) 40 MG tablet Take 40 mg by mouth daily.    . Pitavastatin Calcium (LIVALO) 2 MG TABS Take 2 tablets (4 mg total) by mouth once a week. 10 tablet 3  . sertraline (ZOLOFT) 50 MG tablet Take 1 tablet (50 mg total) by mouth daily. 90 tablet 3   No current facility-administered medications on file prior to visit.    Allergies:   Allergies  Allergen Reactions  . Escitalopram Oxalate Nausea Only  . Lisinopril Other (See Comments)    Very decreased blood pressure   . Statins     myalgias  . Sulfa Antibiotics     hives      OBJECTIVE:  Physical Exam  Vitals:   06/28/20 1439  BP: (!) 158/72  Pulse: 82  Weight: 125 lb (56.7 kg)  Height: 5\' 1"  (1.549 m)   Body mass index is 23.62 kg/m. No exam data present  General: Frail very pleasant elderly Caucasian female, seated in wheelchair, in no evident distress Head: head normocephalic and atraumatic.   Neck: supple with no carotid or supraclavicular bruits Cardiovascular: regular rate and rhythm, no murmurs Musculoskeletal: no deformity Skin:  no rash/petichiae Vascular:  Normal pulses all extremities   Neurologic Exam Mental Status: Awake and fully alert.  Fluent speech and language. Oriented to place and time. Recent and remote memory intact. Attention span, concentration and fund of knowledge appropriate. Mood and affect appropriate.  Cranial Nerves: Pupils equal, briskly reactive to light. Extraocular movements full without nystagmus. Visual fields full to confrontation.  HOH bilaterally. Facial sensation intact. Face, tongue, palate moves normally and symmetrically.  Motor: Normal bulk and tone. Normal strength in all tested extremity muscles. Sensory.: intact to touch , pinprick , position and vibratory sensation.  Coordination: Rapid  alternating movements normal in all extremities. Finger-to-nose and heel-to-shin performed accurately bilaterally. Gait and Station: Stands from seated position with mild difficulty.  Stance is slightly hunched.  Gait demonstrates short quick steps with mild unsteadiness and use of a Rollator walker.  Tandem walk and heel toe not attempted. Reflexes: 1+ and symmetric. Toes downgoing.        ASSESSMENT/PLAN: Danielle Bryan is a 87 y.o. year old female presented with vertigo/dizziness on 01/26/2019 likely due to BPPV with incidental right hippocampal infarct secondary to intracranial arthrosclerosis. Vascular risk  factors include HTN, HLD, intracranial atherosclerosis and prior TIA.      1. R hippocampal stroke:  a. Recovered without residual deficits b. Continue clopidogrel 75 mg daily  and pitavastatin  for secondary stroke prevention.   c. Discussed secondary stroke prevention measures and importance of close PCP follow-up for aggressive stroke risk factor management including HTN with BP goal<130/90 and HLD with LDL<70.  Reports recent lab work obtained by PCP which was satisfactory -unable to personally view via epic d. Declined 30-day cardiac event monitor  2. Insomnia: a. Chronic issue with recent improvement after increasing sertraline dosage to 50 mg daily b. Continue sertraline 50 mg daily -refill provided 3. Hx BPPV:  a. Stable without recent recurrence 4. Seizure type activity:  a. Presented with speech difficulties in 02/2016 diagnosed with L brain TIA vs seizure.   b. Reported prior occurrence and speech difficulty therefore concern for seizure type activity.   c. Remains on Depakote ER 500mg  nightly for seizure prophylaxis - refill provided d. No reoccurring seizure type activity or events e. Obtain valproic acid level today    Follow up in 6 months or call earlier if needed  CC:  [facility] provider: Dr. Salomon Mast, MD     Frann Rider, [facility] [address] [place], [place] [address]  Phone [phone] Fax [phone] Note: This document was prepared with digital dictation and possible smart phrase technology. Any transcriptional errors that result from this process are unintentional.

## 2020-06-29 LAB — VALPROIC ACID LEVEL: Valproic Acid Lvl: 39 ug/mL — ABNORMAL LOW (ref 50–100)

## 2020-07-02 NOTE — Progress Notes (Signed)
I agree with the above plan 

## 2020-07-17 DIAGNOSIS — C44712 Basal cell carcinoma of skin of right lower limb, including hip: Secondary | ICD-10-CM | POA: Diagnosis not present

## 2020-07-17 DIAGNOSIS — D0471 Carcinoma in situ of skin of right lower limb, including hip: Secondary | ICD-10-CM | POA: Diagnosis not present

## 2020-07-17 DIAGNOSIS — D485 Neoplasm of uncertain behavior of skin: Secondary | ICD-10-CM | POA: Diagnosis not present

## 2020-07-17 DIAGNOSIS — Z85828 Personal history of other malignant neoplasm of skin: Secondary | ICD-10-CM | POA: Diagnosis not present

## 2020-07-26 ENCOUNTER — Other Ambulatory Visit: Payer: Self-pay

## 2020-07-26 NOTE — Patient Outreach (Signed)
[facility]) Care Management  07/26/2020  Danielle Bryan 1923/04/28 [id number]   Telephone Assessment    Unsuccessful outreach attempt to patient.     Plan: RN CM will make quarterly outreach attempt to patient within the month of July.   Enzo Montgomery, RN,BSN,CCM [facility] Telephonic Care Management Coordinator Direct Phone: [phone] Toll Free: [phone] Fax: [phone]

## 2020-08-11 DIAGNOSIS — M25511 Pain in right shoulder: Secondary | ICD-10-CM | POA: Diagnosis not present

## 2020-08-11 DIAGNOSIS — M25512 Pain in left shoulder: Secondary | ICD-10-CM | POA: Diagnosis not present

## 2020-09-03 DIAGNOSIS — M170 Bilateral primary osteoarthritis of knee: Secondary | ICD-10-CM | POA: Diagnosis not present

## 2020-09-04 DIAGNOSIS — L570 Actinic keratosis: Secondary | ICD-10-CM | POA: Diagnosis not present

## 2020-09-04 DIAGNOSIS — D1801 Hemangioma of skin and subcutaneous tissue: Secondary | ICD-10-CM | POA: Diagnosis not present

## 2020-09-04 DIAGNOSIS — Z85828 Personal history of other malignant neoplasm of skin: Secondary | ICD-10-CM | POA: Diagnosis not present

## 2020-09-04 DIAGNOSIS — D692 Other nonthrombocytopenic purpura: Secondary | ICD-10-CM | POA: Diagnosis not present

## 2020-09-04 DIAGNOSIS — L738 Other specified follicular disorders: Secondary | ICD-10-CM | POA: Diagnosis not present

## 2020-09-05 ENCOUNTER — Other Ambulatory Visit: Payer: Self-pay

## 2020-09-05 NOTE — Patient Outreach (Signed)
[facility]) Care Management  09/05/2020  ASHIKA APUZZO [birth year] [id number]   Telephone Assessment Quarterly Call    Unsuccessful outreach attempt to patient-no answer after several rings.    Plan: RN CM will make quarterly outreach attempt to patient within the month of Oct.   Blayne Frankie Verl [facility] Telephonic Care Management Coordinator Direct Phone: [phone] Toll Free: [phone] Fax: [phone]

## 2020-10-22 DIAGNOSIS — M170 Bilateral primary osteoarthritis of knee: Secondary | ICD-10-CM | POA: Diagnosis not present

## 2020-10-26 ENCOUNTER — Other Ambulatory Visit: Payer: Self-pay | Admitting: Internal Medicine

## 2020-10-26 ENCOUNTER — Ambulatory Visit
Admission: RE | Admit: 2020-10-26 | Discharge: 2020-10-26 | Disposition: A | Discharge status: Home / Self Care | Payer: Medicare HMO | Source: Ambulatory Visit | Attending: Internal Medicine | Admitting: Internal Medicine

## 2020-10-26 DIAGNOSIS — W19XXXA Unspecified fall, initial encounter: Secondary | ICD-10-CM

## 2020-10-26 DIAGNOSIS — I639 Cerebral infarction, unspecified: Secondary | ICD-10-CM | POA: Diagnosis not present

## 2020-10-26 DIAGNOSIS — E039 Hypothyroidism, unspecified: Secondary | ICD-10-CM | POA: Diagnosis not present

## 2020-10-26 DIAGNOSIS — M810 Age-related osteoporosis without current pathological fracture: Secondary | ICD-10-CM | POA: Diagnosis not present

## 2020-10-26 DIAGNOSIS — Z8673 Personal history of transient ischemic attack (TIA), and cerebral infarction without residual deficits: Secondary | ICD-10-CM | POA: Diagnosis not present

## 2020-10-26 DIAGNOSIS — F331 Major depressive disorder, recurrent, moderate: Secondary | ICD-10-CM | POA: Diagnosis not present

## 2020-10-26 DIAGNOSIS — G319 Degenerative disease of nervous system, unspecified: Secondary | ICD-10-CM | POA: Diagnosis not present

## 2020-10-26 DIAGNOSIS — R296 Repeated falls: Secondary | ICD-10-CM | POA: Diagnosis not present

## 2020-10-26 DIAGNOSIS — E785 Hyperlipidemia, unspecified: Secondary | ICD-10-CM | POA: Diagnosis not present

## 2020-10-26 DIAGNOSIS — S0990XA Unspecified injury of head, initial encounter: Secondary | ICD-10-CM | POA: Diagnosis not present

## 2020-10-26 DIAGNOSIS — I10 Essential (primary) hypertension: Secondary | ICD-10-CM | POA: Diagnosis not present

## 2020-10-26 DIAGNOSIS — D692 Other nonthrombocytopenic purpura: Secondary | ICD-10-CM | POA: Diagnosis not present

## 2020-11-05 DIAGNOSIS — M19011 Primary osteoarthritis, right shoulder: Secondary | ICD-10-CM | POA: Diagnosis not present

## 2020-11-05 DIAGNOSIS — M19012 Primary osteoarthritis, left shoulder: Secondary | ICD-10-CM | POA: Diagnosis not present

## 2020-11-29 ENCOUNTER — Other Ambulatory Visit: Payer: Self-pay

## 2020-11-29 NOTE — Patient Outreach (Signed)
Danielle Bryan) Care Management  11/29/2020  Danielle Bryan 1923/06/29 [id number]   Telephone Assessment    Unsuccessful outreach attempt to patient.   Plan: RN CM will make quarterly outreach attempt to patient within the month of Jan.   Danielle Bryan Management Telephonic Care Management Coordinator Direct Phone: [phone] Toll Free: [phone] Fax: [phone]

## 2020-11-30 ENCOUNTER — Ambulatory Visit: Payer: Self-pay

## 2021-01-02 ENCOUNTER — Ambulatory Visit: Payer: Medicare HMO | Admitting: Adult Health

## 2021-01-02 ENCOUNTER — Telehealth: Payer: Self-pay | Admitting: Adult Health

## 2021-01-02 NOTE — Progress Notes (Deleted)
[facility] [address] [place]. [place] [address] [phone]       OFFICE FOLLOW UP NOTE  [name] Date of Birth:  11/08/1923 Medical Record Number:  [id number]   Reason for Referral: Stroke follow up    SUBJECTIVE:   CHIEF COMPLAINT:  No chief complaint on file.   HPI:   Update 01/02/2021 JM: Returns for 77-month stroke and seizure follow-up  Overall stable since last visit Denies new or reoccurring stroke/TIA symptoms or seizure activity Compliant on Plavix and pitavastatin -denies side effects Compliant on Depakote. ER 500 mg nightly -denies side effects Blood pressures today ***       History provided for reference purposes only Update 06/28/2020 JM: Danielle Bryan returns for 89-month stroke follow-up visit accompanied by her son, [name].  Doing well since prior visit without new or reoccurring stroke/TIA symptoms Reports compliance on Plavix and pitavastatin without associated side effects Blood pressure today 158/72 - occasionally monitors at home and typically 130s Does have care giver aides that come in the morning and evening. Continues to use RW for ambulation - denies any recent falls.   Remains on Depakote tolerating without any reoccurring transient aphasia or seizure-like activity  Remains on sertraline for insomnia with continued benefit reporting she has been sleeping well without difficulty.  Denies depression or anxiety symptoms  No new concerns at this time  Update 12/27/2019 JM: Danielle Bryan returns for follow-up visit unaccompanied.  Overall stable since prior visit without new or reoccurring stroke/TIA symptoms.  Remains on Plavix and pitavastatin for secondary stroke prevention without side effects.  Blood pressure today 147/76.  Remains on Depakote for possible seizure type activity with transient aphasia and denies any reoccurring aphasia type episodes.  Prior complaints of insomnia and increased sertraline dose from 25 mg  daily to 50mg  daily. She does report sleeping better at night and tolerating increase dose well.  No concerns at this time.  Update 10/24/2019 JM: Danielle Bryan returns for 4 month follow-up regarding episode of BPPV and incidental right hippocampal stroke finding in 01/2019.  She has been doing well since prior visit without reoccurring BPV type symptoms and denies new stroke/TIA symptoms.  Remains on clopidogrel and pitavastatin for secondary stroke prevention without side effects.  Blood pressure today 170/75 and on recheck 148/75.  Remains on amlodipine 5 mg daily.  Monitors at home and typically stable. Remains on Depakote for possible seizure activity in 02/2016.  Tolerating medication well without side effects and denies any reoccurring seizure type activity.  She does report increased insomnia and questions possible medication side effect She does admit to mind racing while laying in bed as well as increased anxiety and depression.  She continues to live on her own but does have her son assist as needed She is currently on sertraline 25 mg daily tolerating dosage well without side effects  No further concerns at this time   hospital follow-up 06/14/2019 JM: Danielle Bryan is being seen accompanied by her son for hospital follow-up.  She has been doing well since discharge without residual symptoms and complete resolution of BPPV symptoms.  Ongoing use of rolling walker due to gait impairment secondary to bilateral knee arthritis.  Denies any recent falls.  Continues on Plavix and pitavastatin for secondary stroke prevention.  Blood pressure today 153/78 and does monitor at home and typically SBP 140s.  No concerns at this time.  Stroke admission 01/26/2019: Danielle Bryan is a 87 y.o. female with  history of CVA, HTN, HLD who presented on 01/26/2019 with vertigo/dizziness.  Evaluated by stroke team and found to have incidental finding of right hippocampal infarct secondary to intracranial atherosclerosis  however embolic source cannot be ruled out.  Likely symptoms due to BPPV as Dix-hallpike maneuver positive and recommend outpatient PT.  Recommended 30-day cardiac event monitor to rule out A. fib as potential etiology.  Recommended DAPT for 3 weeks then Plavix alone.  Prior history of TIA in 02/2014 and 02/2016.  HTN stable.  LDL 179 and recommended increasing home pitavastatin dosage.  Underlying seizure disorder with continuation of divalproex 500 mg daily.  Further evaluation by PT and recommended discharge to SNF.  BPPV, right posterior semi cannel  Dix-Hallpike maneuver showed on sitting up right upper beat nystagmus Received canalith repositioning maneuver PT on board Recommend outpatient PT follow-up Fall precaution   Stroke, incidental - R hippocampal infarct likely secondary to intracranial atherosclerosis. However, can not completely rule out embolic source.  MRI  R hippocampal infarct. Small vessel disease. Atrophy.  CTA head and neck significant intracranial medium to small sized vessel  atherosclerosis including bilateral MCA branches and bilateral PCA branches 2D Echo EF 60 to 65% LE venous doppler no DVT EEG no seizure Recommend 30 day monitoring as outpatient to rule out A. fib LDL 179 HgbA1c 5.7 Lovenox 30 mg sq daily for VTE prophylaxis clopidogrel 75 mg daily prior to admission, now on aspirin 81 mg daily and clopidogrel 75 mg daily. Continue DAPT x 3 weeks then back to plavix alone.  Therapy recommendations:  SNF therapy   ROS:   14 system review of systems performed and negative with exception of see HPI  PMH:  Past Medical History:  Diagnosis Date   Hypercholesterolemia    Hypertension    Hypothyroidism    Osteoarthritis    Reactive depression (situational)    Stroke ([place])    TIA (transient ischemic attack)    on plavix    PSH:  Past Surgical History:  Procedure Laterality Date   CATARACT EXTRACTION Left 1992   CATARACT EXTRACTION Right 1995    DEBRIDEMENT AND CLOSURE WOUND      Social History:  Social History   Socioeconomic History   Marital status: Widowed    Spouse name: Not on file   Number of children: 2   Years of education: 27   Highest education level: Not on file  Occupational History   Not on file  Tobacco Use   Smoking status: Never   Smokeless tobacco: Never  Substance and Sexual Activity   Alcohol use: No    Alcohol/week: 0.0 standard drinks   Drug use: No   Sexual activity: Not on file  Other Topics Concern   Not on file  Social History Narrative   Lives at home alone.    Has 2 children.   Caffeine: occasional    Social Determinants of Radio broadcast assistant Strain: Not on file  Food Insecurity: No Food Insecurity   Worried About Charity fundraiser in the Last Year: Never true   Ran Out of Food in the Last Year: Never true  Transportation Needs: No Transportation Needs   Lack of Transportation (Medical): No   Lack of Transportation (Non-Medical): No  Physical Activity: Not on file  Stress: Not on file  Social Connections: Not on file  Intimate Partner Violence: Not on file    Family History:  Family History  Problem Relation Age of Onset  Heart failure Mother    Pneumonia Mother    Heart failure Father     Medications:   Current Outpatient Medications on File Prior to Visit  Medication Sig Dispense Refill   acetaminophen (TYLENOL) 325 MG tablet Take 325 mg by mouth every 6 (six) hours as needed for mild pain.      amLODipine (NORVASC) 5 MG tablet Take 5 mg by mouth daily.     Betamethasone Sodium Phosphate 6 MG/ML SOLN INJECT 2:4 SYRINGE BILATERALLY INTO EACH KNEE JOINT     Calcium Carb-Cholecalciferol 600-800 MG-UNIT TABS Take 1 tablet by mouth 2 (two) times daily.      clopidogrel (PLAVIX) 75 MG tablet Take 1 tablet (75 mg total) by mouth daily. 30 tablet 3   divalproex (DEPAKOTE ER) 500 MG 24 hr tablet TAKE 1 TABLET BY MOUTH EVERY DAY 90 tablet 3   levothyroxine  (SYNTHROID, LEVOTHROID) 100 MCG tablet TAKE 1 TABLET (100 MCG TOTAL) BY MOUTH DAILY. TAKING 6 DAYS A WEEK. DON'T TAKE ON SUNDAYS (Patient taking differently: Take 100 mcg by mouth See admin instructions. Taking Daily except on Sunday,) 90 tablet 0   Multiple Vitamin (MULTIVITAMIN WITH MINERALS) TABS tablet Take 1 tablet by mouth daily.     pantoprazole (PROTONIX) 40 MG tablet Take 40 mg by mouth daily.     Pitavastatin Calcium (LIVALO) 2 MG TABS Take 2 tablets (4 mg total) by mouth once a week. 10 tablet 3   sertraline (ZOLOFT) 50 MG tablet Take 1 tablet (50 mg total) by mouth daily. 90 tablet 3   No current facility-administered medications on file prior to visit.    Allergies:   Allergies  Allergen Reactions   Escitalopram Oxalate Nausea Only   Lisinopril Other (See Comments)    Very decreased blood pressure    Statins     myalgias   Sulfa Antibiotics     hives      OBJECTIVE:  Physical Exam  There were no vitals filed for this visit.  There is no height or weight on file to calculate BMI. No results found.  General: Frail very pleasant elderly Caucasian female, seated in wheelchair, in no evident distress Head: head normocephalic and atraumatic.   Neck: supple with no carotid or supraclavicular bruits Cardiovascular: regular rate and rhythm, no murmurs Musculoskeletal: no deformity Skin:  no rash/petichiae Vascular:  Normal pulses all extremities   Neurologic Exam Mental Status: Awake and fully alert.  Fluent speech and language. Oriented to place and time. Recent and remote memory intact. Attention span, concentration and fund of knowledge appropriate. Mood and affect appropriate.  Cranial Nerves: Pupils equal, briskly reactive to light. Extraocular movements full without nystagmus. Visual fields full to confrontation.  HOH bilaterally. Facial sensation intact. Face, tongue, palate moves normally and symmetrically.  Motor: Normal bulk and tone. Normal strength in all  tested extremity muscles. Sensory.: intact to touch , pinprick , position and vibratory sensation.  Coordination: Rapid alternating movements normal in all extremities. Finger-to-nose and heel-to-shin performed accurately bilaterally. Gait and Station: Stands from seated position with mild difficulty.  Stance is slightly hunched.  Gait demonstrates short quick steps with mild unsteadiness and use of a Rollator walker.  Tandem walk and heel toe not attempted. Reflexes: 1+ and symmetric. Toes downgoing.        ASSESSMENT/PLAN: Danielle Bryan is a 87 y.o. year old female presented with vertigo/dizziness on 01/26/2019 likely due to BPPV with incidental right hippocampal infarct secondary to intracranial arthrosclerosis. Vascular risk  factors include HTN, HLD, intracranial atherosclerosis and prior TIA.      R hippocampal stroke:  Recovered without residual deficits Continue clopidogrel 75 mg daily  and pitavastatin  for secondary stroke prevention.   Discussed secondary stroke prevention measures and importance of close PCP follow-up for aggressive stroke risk factor management including HTN with BP goal<130/90 and HLD with LDL<70.  Reports recent lab work obtained by PCP which was satisfactory -unable to personally view via epic Declined 30-day cardiac event monitor  Insomnia: Chronic issue with recent improvement after increasing sertraline dosage to 50 mg daily Continue sertraline 50 mg daily -refill provided Hx BPPV:  Stable without recent recurrence Seizure type activity:  Presented with speech difficulties in 02/2016 diagnosed with L brain TIA vs seizure.   Reported prior occurrence and speech difficulty therefore concern for seizure type activity.   Remains on Depakote ER 500mg  nightly for seizure prophylaxis - refill provided No reoccurring seizure type activity or events Prior Depakote level 39 - on low side but also lower dosage - as no reoccurring events no indication for dose  adjustment. Will plan on repeat at next f/u visit. CBC and CMP routinely obtained by PCP per pt    Follow up in 6 months or call earlier if needed  CC:  Velna Hatchet, MD     Frann Rider, AGNP-BC  [facility] [address] [place], [place] [address]  Phone [phone] Fax [phone] Note: This document was prepared with digital dictation and possible smart phrase technology. Any transcriptional errors that result from this process are unintentional.

## 2021-01-02 NOTE — Telephone Encounter (Signed)
Pt's son, [name] cancelled appt due to she is not able to come due to schedule conflict.

## 2021-01-03 ENCOUNTER — Ambulatory Visit: Payer: Medicare HMO | Admitting: Adult Health

## 2021-01-12 ENCOUNTER — Observation Stay (HOSPITAL_COMMUNITY): Payer: Medicare HMO

## 2021-01-12 ENCOUNTER — Other Ambulatory Visit: Payer: Self-pay

## 2021-01-12 ENCOUNTER — Encounter (HOSPITAL_COMMUNITY): Payer: Self-pay | Admitting: Emergency Medicine

## 2021-01-12 ENCOUNTER — Emergency Department (HOSPITAL_COMMUNITY): Payer: Medicare HMO

## 2021-01-12 ENCOUNTER — Inpatient Hospital Stay (HOSPITAL_COMMUNITY)
Admission: EM | Admit: 2021-01-12 | Discharge: 2021-01-16 | DRG: 640 | Disposition: A | Discharge status: Skilled Nursing Facility | Payer: Medicare HMO | Attending: Family Medicine | Admitting: Family Medicine

## 2021-01-12 DIAGNOSIS — R29898 Other symptoms and signs involving the musculoskeletal system: Secondary | ICD-10-CM | POA: Diagnosis not present

## 2021-01-12 DIAGNOSIS — W010XXA Fall on same level from slipping, tripping and stumbling without subsequent striking against object, initial encounter: Secondary | ICD-10-CM | POA: Diagnosis present

## 2021-01-12 DIAGNOSIS — R1312 Dysphagia, oropharyngeal phase: Secondary | ICD-10-CM | POA: Diagnosis not present

## 2021-01-12 DIAGNOSIS — F32A Depression, unspecified: Secondary | ICD-10-CM | POA: Diagnosis present

## 2021-01-12 DIAGNOSIS — R627 Adult failure to thrive: Secondary | ICD-10-CM | POA: Diagnosis present

## 2021-01-12 DIAGNOSIS — Z79890 Hormone replacement therapy (postmenopausal): Secondary | ICD-10-CM | POA: Diagnosis not present

## 2021-01-12 DIAGNOSIS — Z66 Do not resuscitate: Secondary | ICD-10-CM | POA: Diagnosis present

## 2021-01-12 DIAGNOSIS — R638 Other symptoms and signs concerning food and fluid intake: Secondary | ICD-10-CM | POA: Diagnosis not present

## 2021-01-12 DIAGNOSIS — Z7902 Long term (current) use of antithrombotics/antiplatelets: Secondary | ICD-10-CM | POA: Diagnosis not present

## 2021-01-12 DIAGNOSIS — E041 Nontoxic single thyroid nodule: Secondary | ICD-10-CM | POA: Diagnosis not present

## 2021-01-12 DIAGNOSIS — I3139 Other pericardial effusion (noninflammatory): Secondary | ICD-10-CM | POA: Diagnosis not present

## 2021-01-12 DIAGNOSIS — Z7189 Other specified counseling: Secondary | ICD-10-CM | POA: Diagnosis not present

## 2021-01-12 DIAGNOSIS — G9340 Encephalopathy, unspecified: Secondary | ICD-10-CM

## 2021-01-12 DIAGNOSIS — R41841 Cognitive communication deficit: Secondary | ICD-10-CM | POA: Diagnosis not present

## 2021-01-12 DIAGNOSIS — Z9181 History of falling: Secondary | ICD-10-CM | POA: Diagnosis not present

## 2021-01-12 DIAGNOSIS — L89621 Pressure ulcer of left heel, stage 1: Secondary | ICD-10-CM | POA: Diagnosis present

## 2021-01-12 DIAGNOSIS — Z8249 Family history of ischemic heart disease and other diseases of the circulatory system: Secondary | ICD-10-CM | POA: Diagnosis not present

## 2021-01-12 DIAGNOSIS — Z20822 Contact with and (suspected) exposure to covid-19: Secondary | ICD-10-CM | POA: Diagnosis present

## 2021-01-12 DIAGNOSIS — G9341 Metabolic encephalopathy: Secondary | ICD-10-CM | POA: Diagnosis present

## 2021-01-12 DIAGNOSIS — M7989 Other specified soft tissue disorders: Secondary | ICD-10-CM | POA: Diagnosis not present

## 2021-01-12 DIAGNOSIS — I69828 Other speech and language deficits following other cerebrovascular disease: Secondary | ICD-10-CM | POA: Diagnosis not present

## 2021-01-12 DIAGNOSIS — L89611 Pressure ulcer of right heel, stage 1: Secondary | ICD-10-CM | POA: Diagnosis present

## 2021-01-12 DIAGNOSIS — D72829 Elevated white blood cell count, unspecified: Secondary | ICD-10-CM

## 2021-01-12 DIAGNOSIS — S022XXA Fracture of nasal bones, initial encounter for closed fracture: Secondary | ICD-10-CM | POA: Diagnosis present

## 2021-01-12 DIAGNOSIS — W19XXXA Unspecified fall, initial encounter: Secondary | ICD-10-CM | POA: Diagnosis present

## 2021-01-12 DIAGNOSIS — D696 Thrombocytopenia, unspecified: Secondary | ICD-10-CM | POA: Diagnosis present

## 2021-01-12 DIAGNOSIS — I69891 Dysphagia following other cerebrovascular disease: Secondary | ICD-10-CM | POA: Diagnosis not present

## 2021-01-12 DIAGNOSIS — J90 Pleural effusion, not elsewhere classified: Secondary | ICD-10-CM | POA: Diagnosis not present

## 2021-01-12 DIAGNOSIS — E860 Dehydration: Secondary | ICD-10-CM | POA: Diagnosis present

## 2021-01-12 DIAGNOSIS — Z8673 Personal history of transient ischemic attack (TIA), and cerebral infarction without residual deficits: Secondary | ICD-10-CM | POA: Diagnosis not present

## 2021-01-12 DIAGNOSIS — K219 Gastro-esophageal reflux disease without esophagitis: Secondary | ICD-10-CM | POA: Diagnosis present

## 2021-01-12 DIAGNOSIS — M6281 Muscle weakness (generalized): Secondary | ICD-10-CM | POA: Diagnosis not present

## 2021-01-12 DIAGNOSIS — E039 Hypothyroidism, unspecified: Secondary | ICD-10-CM | POA: Diagnosis present

## 2021-01-12 DIAGNOSIS — Z7401 Bed confinement status: Secondary | ICD-10-CM | POA: Diagnosis not present

## 2021-01-12 DIAGNOSIS — R2689 Other abnormalities of gait and mobility: Secondary | ICD-10-CM | POA: Diagnosis not present

## 2021-01-12 DIAGNOSIS — R296 Repeated falls: Secondary | ICD-10-CM | POA: Diagnosis present

## 2021-01-12 DIAGNOSIS — Z789 Other specified health status: Secondary | ICD-10-CM | POA: Diagnosis not present

## 2021-01-12 DIAGNOSIS — S022XXD Fracture of nasal bones, subsequent encounter for fracture with routine healing: Secondary | ICD-10-CM | POA: Diagnosis not present

## 2021-01-12 DIAGNOSIS — G40909 Epilepsy, unspecified, not intractable, without status epilepticus: Secondary | ICD-10-CM | POA: Diagnosis present

## 2021-01-12 DIAGNOSIS — R55 Syncope and collapse: Secondary | ICD-10-CM | POA: Diagnosis not present

## 2021-01-12 DIAGNOSIS — I517 Cardiomegaly: Secondary | ICD-10-CM | POA: Diagnosis not present

## 2021-01-12 DIAGNOSIS — Z711 Person with feared health complaint in whom no diagnosis is made: Secondary | ICD-10-CM | POA: Diagnosis not present

## 2021-01-12 DIAGNOSIS — I10 Essential (primary) hypertension: Secondary | ICD-10-CM | POA: Diagnosis present

## 2021-01-12 DIAGNOSIS — Z515 Encounter for palliative care: Secondary | ICD-10-CM | POA: Diagnosis not present

## 2021-01-12 DIAGNOSIS — K449 Diaphragmatic hernia without obstruction or gangrene: Secondary | ICD-10-CM | POA: Diagnosis not present

## 2021-01-12 DIAGNOSIS — R9082 White matter disease, unspecified: Secondary | ICD-10-CM | POA: Diagnosis not present

## 2021-01-12 DIAGNOSIS — R531 Weakness: Secondary | ICD-10-CM | POA: Diagnosis not present

## 2021-01-12 DIAGNOSIS — M25511 Pain in right shoulder: Secondary | ICD-10-CM | POA: Diagnosis not present

## 2021-01-12 DIAGNOSIS — L8990 Pressure ulcer of unspecified site, unspecified stage: Secondary | ICD-10-CM | POA: Insufficient documentation

## 2021-01-12 DIAGNOSIS — M8580 Other specified disorders of bone density and structure, unspecified site: Secondary | ICD-10-CM | POA: Diagnosis present

## 2021-01-12 LAB — CBC WITH DIFFERENTIAL/PLATELET
Abs Immature Granulocytes: 0.04 10*3/uL (ref 0.00–0.07)
Basophils Absolute: 0 10*3/uL (ref 0.0–0.1)
Basophils Relative: 0 %
Eosinophils Absolute: 0 10*3/uL (ref 0.0–0.5)
Eosinophils Relative: 0 %
HCT: 40.1 % (ref 36.0–46.0)
Hemoglobin: 12.8 g/dL (ref 12.0–15.0)
Immature Granulocytes: 0 %
Lymphocytes Relative: 7 %
Lymphs Abs: 0.9 10*3/uL (ref 0.7–4.0)
MCH: 29.4 pg (ref 26.0–34.0)
MCHC: 31.9 g/dL (ref 30.0–36.0)
MCV: 92 fL (ref 80.0–100.0)
Monocytes Absolute: 0.8 10*3/uL (ref 0.1–1.0)
Monocytes Relative: 7 %
Neutro Abs: 10.1 10*3/uL — ABNORMAL HIGH (ref 1.7–7.7)
Neutrophils Relative %: 86 %
Platelets: 140 10*3/uL — ABNORMAL LOW (ref 150–400)
RBC: 4.36 MIL/uL (ref 3.87–5.11)
RDW: 13.2 % (ref 11.5–15.5)
WBC: 11.9 10*3/uL — ABNORMAL HIGH (ref 4.0–10.5)
nRBC: 0 % (ref 0.0–0.2)

## 2021-01-12 LAB — MAGNESIUM: Magnesium: 2 mg/dL (ref 1.7–2.4)

## 2021-01-12 LAB — URINALYSIS, ROUTINE W REFLEX MICROSCOPIC
Bacteria, UA: NONE SEEN
Bilirubin Urine: NEGATIVE
Glucose, UA: NEGATIVE mg/dL
Hgb urine dipstick: NEGATIVE
Ketones, ur: 20 mg/dL — AB
Leukocytes,Ua: NEGATIVE
Nitrite: NEGATIVE
Protein, ur: 100 mg/dL — AB
Specific Gravity, Urine: 1.024 (ref 1.005–1.030)
pH: 5 (ref 5.0–8.0)

## 2021-01-12 LAB — CK: Total CK: 34 U/L — ABNORMAL LOW (ref 38–234)

## 2021-01-12 LAB — COMPREHENSIVE METABOLIC PANEL
ALT: 8 U/L (ref 0–44)
AST: 17 U/L (ref 15–41)
Albumin: 3.6 g/dL (ref 3.5–5.0)
Alkaline Phosphatase: 54 U/L (ref 38–126)
Anion gap: 12 (ref 5–15)
BUN: 20 mg/dL (ref 8–23)
CO2: 27 mmol/L (ref 22–32)
Calcium: 9.4 mg/dL (ref 8.9–10.3)
Chloride: 99 mmol/L (ref 98–111)
Creatinine, Ser: 0.7 mg/dL (ref 0.44–1.00)
GFR, Estimated: >60 mL/min (ref >60)
Glucose, Bld: 134 mg/dL — ABNORMAL HIGH (ref 70–99)
Potassium: 3.8 mmol/L (ref 3.5–5.1)
Sodium: 138 mmol/L (ref 135–145)
Total Bilirubin: 0.9 mg/dL (ref 0.3–1.2)
Total Protein: 6.9 g/dL (ref 6.5–8.1)

## 2021-01-12 LAB — PHOSPHORUS: Phosphorus: 3 mg/dL (ref 2.5–4.6)

## 2021-01-12 LAB — TSH: TSH: 1.273 u[IU]/mL (ref 0.350–4.500)

## 2021-01-12 LAB — RESP PANEL BY RT-PCR (FLU A&B, COVID) ARPGX2
Influenza A by PCR: NEGATIVE
Influenza B by PCR: NEGATIVE
SARS Coronavirus 2 by RT PCR: NEGATIVE

## 2021-01-12 MED ORDER — LACTATED RINGERS IV SOLN: INTRAVENOUS | Status: DC

## 2021-01-12 MED ORDER — ACETAMINOPHEN 325 MG PO TABS
650.0000 mg | ORAL_TABLET | Freq: Four times a day (QID) | ORAL | Status: DC | PRN
  Administered 2021-01-15 – 2021-01-16 (×2): 650 mg via ORAL
  Filled 2021-01-12 (×2): qty 2

## 2021-01-12 MED ORDER — DIVALPROEX SODIUM ER 500 MG PO TB24
500.0000 mg | ORAL_TABLET | Freq: Every morning | ORAL | Status: DC
  Administered 2021-01-13 – 2021-01-16 (×4): 500 mg via ORAL
  Filled 2021-01-12 (×2): qty 1

## 2021-01-12 MED ORDER — ACETAMINOPHEN 650 MG RE SUPP: 650.0000 mg | Freq: Four times a day (QID) | RECTAL | Status: DC | PRN

## 2021-01-12 MED ORDER — LACTATED RINGERS IV SOLN: INTRAVENOUS | Status: AC

## 2021-01-12 MED ORDER — LACTATED RINGERS IV BOLUS
1000.0000 mL | Freq: Once | INTRAVENOUS | Status: AC
  Administered 2021-01-12: 1000 mL via INTRAVENOUS

## 2021-01-12 MED ORDER — SERTRALINE HCL 50 MG PO TABS
50.0000 mg | ORAL_TABLET | Freq: Every day | ORAL | Status: DC
  Administered 2021-01-13 – 2021-01-16 (×4): 50 mg via ORAL
  Filled 2021-01-12 (×4): qty 1

## 2021-01-12 MED ORDER — ACETAMINOPHEN 325 MG PO TABS
650.0000 mg | ORAL_TABLET | Freq: Once | ORAL | Status: AC
  Administered 2021-01-12: 650 mg via ORAL
  Filled 2021-01-12: qty 2

## 2021-01-12 MED ORDER — LEVOTHYROXINE SODIUM 100 MCG PO TABS
100.0000 ug | ORAL_TABLET | ORAL | Status: DC
Start: 2021-01-14 — End: 2021-01-16
  Administered 2021-01-14 – 2021-01-16 (×3): 100 ug via ORAL
  Filled 2021-01-12 (×3): qty 1

## 2021-01-12 MED ORDER — PANTOPRAZOLE SODIUM 40 MG PO TBEC
40.0000 mg | DELAYED_RELEASE_TABLET | Freq: Every evening | ORAL | Status: DC
  Administered 2021-01-14 – 2021-01-15 (×2): 40 mg via ORAL
  Filled 2021-01-12 (×2): qty 1

## 2021-01-12 NOTE — ED Provider Notes (Addendum)
Emergency Medicine Provider Triage Evaluation Note  Danielle Bryan , a 87 y.o. female  was evaluated in triage.  Apparently patient was found down after a fall.  She had missed her handrail when getting out of the bed and fell during the night.  She says she hit her nose and it was bleeding at the time but is now stopped.  Denies hitting her head or any loss of consciousness.  She denies any other pains anywhere else.  She is on Plavix at home.  Review of Systems  Positive:  Negative:   Physical Exam  There were no vitals taken for this visit. Gen:   Awake, no distress   Resp:  Normal effort  MSK:   Moves extremities without difficulty  Other:  Small abrasion to the bridge of nose.  No obvious neurological deficits on exam.  No traumatic injuries to head.  Medical Decision Making  Medically screening exam initiated at 11:22 AM.  Appropriate orders placed.  Danielle Bryan was informed that the remainder of the evaluation will be completed by another provider, this initial triage assessment does not replace that evaluation, and the importance of remaining in the ED until their evaluation is complete.    Adolphus Birchwood, PA-C 01/12/21 1123    Sheila Oats 01/12/21 1124    Pattricia Boss, MD 01/13/21 [phone]

## 2021-01-12 NOTE — ED Provider Notes (Signed)
[facility] EMERGENCY DEPARTMENT Provider Note   CSN: [id number] Arrival date & time: 01/12/21  1101     History Chief Complaint  Patient presents with   Danielle Bryan is a 87 y.o. female.   Fall Pertinent negatives include no chest pain, no abdominal pain, no headaches and no shortness of breath.  87 year old female with PMH significant for HTN, HLD, hypothyroidism, prior stroke on Plavix, TIA, and others as below who presents to the ED accompanied by her son complaints of GL F.  Patient has had progressive weakness over the past month of unclear etiology resulting in multiple falls.  Son reports that in the prior year she had fallen twice total, but over the past month has fallen 3 times.  The falls have all been secondary to generalized weakness and mechanical sources rather than syncopal events.  Her last fall was today, when she missed the handrail getting up from the toilet and fell forward onto her right face and shoulder.  She had short-term right-sided epistaxis after the fall.  She was found on the ground by her son.  He reports that over the past year she has been becoming increasingly confused, slowed, and has had very minimal appetite and p.o. intake.  He reports that she has not eaten anything in over a day.  He feels as if the patient is not safe to return home, as she lives alone.  He believes it is time to place her in a facility that can assist her with ambulation.  Arrival, the patient is complaining of pain in her right shoulder and right arm.  Past Medical History:  Diagnosis Date   Hypercholesterolemia    Hypertension    Hypothyroidism    Osteoarthritis    Reactive depression (situational)    Stroke [facility] [place])    TIA (transient ischemic attack)    on plavix    Patient Active Problem List   Diagnosis Date Noted   Fall 01/12/2021   Nasal bone fractures 01/12/2021   Pain due to onychomycosis of toenails of both feet 05/04/2019    Coagulation defect ([place]) 05/04/2019   Vertigo 01/27/2019   CVA (cerebral vascular accident) ([place]) 01/27/2019   Aphasia 03/20/2016   HTN (hypertension) 03/20/2016   Weakness 03/18/2015   Acute kidney injury ([place]) 03/18/2015   Dehydration 03/18/2015   Nausea and vomiting 03/18/2015   Thrombocytopenia ([place]) 03/18/2015   Moderate nausea and vomiting 03/18/2015   Elevated lactic acid level 03/18/2015   Elevated troponin 40/98/1191   Acute metabolic encephalopathy 47/82/9562   UTI (urinary tract infection) 08/22/2014   TIA (transient ischemic attack) 03/19/2014   Benign hypertensive heart disease without heart failure 08/20/2010   Hypothyroidism    Osteoarthritis    Hypercholesterolemia    Reactive depression (situational)     Past Surgical History:  Procedure Laterality Date   CATARACT EXTRACTION Left 1992   CATARACT EXTRACTION Right 1995   DEBRIDEMENT AND CLOSURE WOUND       OB History   No obstetric history on file.     Family History  Problem Relation Age of Onset   Heart failure Mother    Pneumonia Mother    Heart failure Father     Social History   Tobacco Use   Smoking status: Never   Smokeless tobacco: Never  Substance Use Topics   Alcohol use: No    Alcohol/week: 0.0 standard drinks   Drug use: No    Home Medications Prior  to Admission medications   Medication Sig Start Date End Date Taking? Authorizing Provider  acetaminophen (TYLENOL) 500 MG tablet Take 1,000 mg by mouth See admin instructions. Take 2 tablets (1000 mg) by mouth every morning, may also take 2 tablets (1000 mg) in the evening as needed for headache/pain   Yes [provider]  amLODipine (NORVASC) 5 MG tablet Take 5 mg by mouth every morning. 03/18/16  Yes [provider]  Ascorbic Acid (VITAMIN C PO) Take 1 tablet by mouth every evening.   Yes [provider]  CALCIUM CARB-CHOLECALCIFEROL PO Take 1 tablet by mouth every evening.   Yes [provider]   Cholecalciferol (VITAMIN D3 PO) Take 1 tablet by mouth every evening.   Yes [provider]  clopidogrel (PLAVIX) 75 MG tablet Take 1 tablet (75 mg total) by mouth daily. Patient taking differently: Take 75 mg by mouth every morning. 03/21/14  Yes Velna Hatchet, MD  divalproex (DEPAKOTE ER) 500 MG 24 hr tablet TAKE 1 TABLET BY MOUTH EVERY DAY Patient taking differently: 500 mg every morning. 04/19/20  Yes McCue, Janett Billow, NP  levothyroxine (SYNTHROID, LEVOTHROID) 100 MCG tablet TAKE 1 TABLET (100 MCG TOTAL) BY MOUTH DAILY. TAKING 6 DAYS A WEEK. DON'T TAKE ON SUNDAYS Patient taking differently: Take 100 mcg by mouth See admin instructions. Take one tablet (100 mcg) by mouth 6 days weekly in the morning, skip Sundays   Yes Darlin Coco, MD  pantoprazole (PROTONIX) 40 MG tablet Take 40 mg by mouth every evening.   Yes [provider]  sertraline (ZOLOFT) 25 MG tablet Take 50 mg by mouth every morning. 11/04/20  Yes [provider]  triamcinolone cream (KENALOG) 0.5 % Apply 1 application topically daily as needed (rash/irritation).   Yes [provider]  Trolamine Salicylate (ASPERCREME EX) Apply 1 application topically daily as needed (knee pain).   Yes [provider]  Pitavastatin Calcium (LIVALO) 2 MG TABS Take 2 tablets (4 mg total) by mouth once a week. Patient not taking: Reported on 01/12/2021 01/30/19   Raiford Noble Latif, DO  sertraline (ZOLOFT) 50 MG tablet Take 1 tablet (50 mg total) by mouth daily. Patient not taking: Reported on 01/12/2021 06/28/20   Frann Rider, NP    Allergies    Escitalopram oxalate, Lisinopril, Statins, and Sulfa antibiotics  Review of Systems   Review of Systems  Constitutional:  Positive for activity change, appetite change and fatigue. Negative for chills and fever.  HENT:  Positive for nosebleeds. Negative for ear pain and sore throat.   Eyes:  Negative for pain and visual disturbance.  Respiratory:   Negative for cough and shortness of breath.   Cardiovascular:  Negative for chest pain, palpitations and leg swelling.  Gastrointestinal:  Negative for abdominal pain, nausea and vomiting.  Genitourinary:  Negative for dysuria and hematuria.  Musculoskeletal:  Negative for arthralgias, back pain, neck pain and neck stiffness.  Skin:  Positive for pallor. Negative for color change and rash.  Neurological:  Positive for tremors and weakness. Negative for dizziness, seizures, syncope, light-headedness, numbness and headaches.  Psychiatric/Behavioral:  Positive for confusion. The patient is not nervous/anxious.   All other systems reviewed and are negative.  Physical Exam Updated Vital Signs BP (!) 135/91   Pulse (!) 105   Temp 98.9 F (37.2 C) (Oral)   Resp (!) 24   SpO2 97%   Physical Exam Vitals and nursing note reviewed.  Constitutional:      General: She is not  in acute distress.    Appearance: Normal appearance. She is well-developed and underweight. She is not ill-appearing, toxic-appearing or diaphoretic.     Comments: Dry, tacky mucous membranes  HENT:     Head: Normocephalic and atraumatic. No right periorbital erythema or left periorbital erythema.     Jaw: There is normal jaw occlusion.     Right Ear: External ear normal.     Left Ear: External ear normal.     Nose: Signs of injury present. No nasal deformity or septal deviation.     Right Nostril: Epistaxis present. No septal hematoma.     Left Nostril: No epistaxis or septal hematoma.     Comments: Dried epistaxis in right nare.  Bruising to right sided nasal bridge, nontender and without facial instability    Mouth/Throat:     Lips: Pink.     Mouth: Mucous membranes are dry.     Pharynx: Oropharynx is clear. Uvula midline. No oropharyngeal exudate or posterior oropharyngeal erythema.  Eyes:     General: Vision grossly intact. No scleral icterus.    Extraocular Movements: Extraocular movements intact.      Conjunctiva/sclera: Conjunctivae normal.     Pupils: Pupils are equal, round, and reactive to light.  Neck:     Trachea: Trachea and phonation normal.  Cardiovascular:     Rate and Rhythm: Regular rhythm. Tachycardia present.     Pulses: Normal pulses.     Heart sounds: Normal heart sounds. No murmur heard. Pulmonary:     Effort: Pulmonary effort is normal. No respiratory distress.     Breath sounds: Normal breath sounds and air entry.  Abdominal:     General: Abdomen is flat. There is no distension.     Palpations: Abdomen is soft.     Tenderness: There is no abdominal tenderness. There is no guarding or rebound.  Musculoskeletal:        General: Tenderness present. Normal range of motion.     Cervical back: Normal, full passive range of motion without pain, normal range of motion and neck supple. No rigidity, tenderness or bony tenderness. No spinous process tenderness or muscular tenderness.     Thoracic back: Normal. No tenderness or bony tenderness.     Lumbar back: Normal. No tenderness or bony tenderness.     Right lower leg: No edema.     Left lower leg: No edema.  Lymphadenopathy:     Cervical: No cervical adenopathy.  Skin:    General: Skin is warm and dry.     Capillary Refill: Capillary refill takes less than 2 seconds.  Neurological:     General: No focal deficit present.     Mental Status: She is alert. Mental status is at baseline. She is confused.     GCS: GCS eye subscore is 4. GCS verbal subscore is 4. GCS motor subscore is 6.     Cranial Nerves: Cranial nerves 2-12 are intact. No cranial nerve deficit, dysarthria or facial asymmetry.     Sensory: Sensation is intact.     Motor: Weakness and tremor present.     Comments: At neurologic baseline per son at the bedside no obvious focal neurologic deficits, answers questions appropriately.  Neurolysed weakness but no focal or asymmetric weakness.  Ambulates with a walker at baseline.  Psychiatric:        Mood and  Affect: Mood normal.        Behavior: Behavior normal. Behavior is cooperative.    ED Results /  Procedures / Treatments   Labs (all labs ordered are listed, but only abnormal results are displayed) Labs Reviewed  CBC WITH DIFFERENTIAL/PLATELET - Abnormal; Notable for the following components:      Result Value   WBC 11.9 (*)    Platelets 140 (*)    Neutro Abs 10.1 (*)    All other components within normal limits  COMPREHENSIVE METABOLIC PANEL - Abnormal; Notable for the following components:   Glucose, Bld 134 (*)    All other components within normal limits  URINALYSIS, ROUTINE W REFLEX MICROSCOPIC - Abnormal; Notable for the following components:   Ketones, ur 20 (*)    Protein, ur 100 (*)    All other components within normal limits  CK - Abnormal; Notable for the following components:   Total CK 34 (*)    All other components within normal limits  D-DIMER, QUANTITATIVE - Abnormal; Notable for the following components:   D-Dimer, Quant 3.13 (*)    All other components within normal limits  VITAMIN B12 - Abnormal; Notable for the following components:   Vitamin B-12 3,020 (*)    All other components within normal limits  VALPROIC ACID LEVEL - Abnormal; Notable for the following components:   Valproic Acid Lvl 25 (*)    All other components within normal limits  RESP PANEL BY RT-PCR (FLU A&B, COVID) [id number]  URINE CULTURE  MAGNESIUM  PHOSPHORUS  TSH  AMMONIA  CBC    EKG None  Radiology DG Shoulder Right  Result Date: 01/12/2021 CLINICAL DATA:  fall, right shoulder pain EXAM: RIGHT SHOULDER - 2+ VIEW; RIGHT HUMERUS - 2+ VIEW COMPARISON:  None. FINDINGS: There is no evidence of fracture or dislocation. Severe degenerative changes of the right shoulder. Visualized portions of the right elbow grossly unremarkable with limited evaluation due to positioning/nondedicated view. Soft tissues are unremarkable. IMPRESSION: No acute displaced fracture or dislocation of the right  humerus or shoulder. Electronically Signed   By: Iven Finn M.D.   On: 01/12/2021 18:55   DG Knee 2 Views Left  Result Date: 01/12/2021 CLINICAL DATA:  Fall EXAM: LEFT KNEE - 1-2 VIEW COMPARISON:  None. FINDINGS: No evidence of acute fracture or malalignment. Trace knee joint effusion, nonspecific. Severe tricompartmental osteoarthritis, most advanced in the lateral compartment. Diffuse bony demineralization. Soft tissue swelling is seen about the knee. Advanced atherosclerotic vascular calcifications. IMPRESSION: 1. No acute fracture or malalignment. 2. Severe tricompartmental osteoarthritis, most advanced in the lateral compartment. Electronically Signed   By: Davina Poke D.O.   On: 01/12/2021 18:59   CT Head Wo Contrast  Result Date: 01/12/2021 CLINICAL DATA:  Found down, unwitnessed fall EXAM: CT HEAD WITHOUT CONTRAST CT MAXILLOFACIAL WITHOUT CONTRAST CT CERVICAL SPINE WITHOUT CONTRAST TECHNIQUE: Multidetector CT imaging of the head, cervical spine, and maxillofacial structures were performed using the standard protocol without intravenous contrast. Multiplanar CT image reconstructions of the cervical spine and maxillofacial structures were also generated. COMPARISON:  10/26/2020 FINDINGS: CT HEAD FINDINGS Brain: No evidence of acute infarction, hemorrhage, hydrocephalus, extra-axial collection or mass lesion/mass effect. Periventricular and deep white matter hypodensity. Vascular: No hyperdense vessel or unexpected calcification. CT FACIAL BONES FINDINGS Skull: Normal. Negative for fracture or focal lesion. Facial bones: Possible nondisplaced fractures of the nasal bones (series 5, image 60). No other displaced fractures or dislocations. Sinuses/Orbits: No acute finding. Other: None. CT CERVICAL SPINE FINDINGS Alignment: Normal. Skull base and vertebrae: No acute fracture. No primary bone lesion or focal pathologic process. Soft tissues and spinal  canal: No prevertebral fluid or swelling. No  visible canal hematoma. Disc levels:  Mild disc space height loss and osteophytosis. Upper chest: Negative. Other: None. IMPRESSION: 1. No acute intracranial pathology. Small-vessel white matter disease. 2. Possible nondisplaced fractures of the nasal bones. 3. No other displaced fractures or dislocations of the facial bones. 4. No fracture or static subluxation of the cervical spine. Electronically Signed   By: Delanna Ahmadi M.D.   On: 01/12/2021 13:23   CT Angio Chest Pulmonary Embolism (PE) W or WO Contrast  Result Date: 01/13/2021 CLINICAL DATA:  Suspected pulmonary embolism. Recent fall injury as well. EXAM: CT ANGIOGRAPHY CHEST WITH CONTRAST TECHNIQUE: Multidetector CT imaging of the chest was performed using the standard protocol during bolus administration of intravenous contrast. Multiplanar CT image reconstructions and MIPs were obtained to evaluate the vascular anatomy. CONTRAST:  17mL ISOVUE-370 IOPAMIDOL (ISOVUE-370) INJECTION 76% COMPARISON:  No prior chest CT for comparison. Last abdomen and pelvis CT is available for comparison, dated 01/11/2005. FINDINGS: Cardiovascular: The heart is slightly enlarged and there is a small pericardial effusion in the superior recess. The coronary arteries are increasingly heavily calcified and there is calcification in the posterior mitral ring. Pulmonary arteries are normal in caliber without evidence of thromboemboli. There are mildly distended superior pulmonary veins. There is no aortic aneurysm or dissection, there is moderate to heavy patchy mixed plaque in the arch and descending segments with moderate descending segment tortuosity and with scattered ulcerative plaque in the descending segment. Mediastinum/Nodes: Heterogeneous 1.2 cm nodule in the left lobe of the thyroid gland and subcentimeter hypodense nodule in the right lobe. There are mildly prominent lymph nodes in the subcarinal mediastinum to the right up to 1.3 cm short axis. There is a small  hiatal hernia and a moderately thickened distal thoracic esophagus, mild patulous appearance of the upper to the midthoracic esophagus. Lungs/Pleura: There are bilateral minimal layering pleural effusions. This was seen on prior study as well. There are subpleural chronic reticulated interstitial changes of lungs at all levels but with a basal predominance, consistent with UIP fibrosis pattern without honeycombing. Lungs are hyperinflated without evidence of nodule or consolidated infiltrate but there may be slight interstitial edema in the extreme lung bases. The trachea and central airways are clear. Upper Abdomen: No acute abnormality. Visualized abdominal aorta, proximal celiac artery and proximal SMA are heavily calcified. Musculoskeletal: There is pronounced thoracic kyphosis. There is osteopenia. There is a severe anterior wedge compression fracture of the T10 vertebral body with chronic appearance, mild wedging of the T8, T9, and T11 vertebrae, without retropulsion and with degenerative changes of the spine and shoulders. There is no displaced rib or sternal fracture or chest wall hematoma. Review of the MIP images confirms the above findings. IMPRESSION: 1. Cardiomegaly with mildly distended superior pulmonary veins and questionable slight interstitial edema in the base of the lungs with minimal pleural effusions. 2. No evidence of pulmonary arterial embolism or arterial dilatation. 3. Three-vessel calcific CAD, small pericardial effusion, and moderate to heavy aortic mixed plaques with ulcerative plaque of the descending segment. 4. Basal predominant subpleural interstitial reticulation. 5. 1.2 cm left thyroid nodule.  No imaging follow-up is required. 6. Small hiatal hernia with increasingly thickened distal thoracic esophagus. Consider endoscopic follow-up. 7. Mildly prominent subcarinal lymph nodes the right but no bulky or encasing adenopathy or further significant lymph nodes. 8. Severe thoracic  kyphosis, osteopenia and degenerative change, and chronic appearing compression fractures in lower thoracic spine greatest at T10. This  was not seen on the prior abdomen pelvis CT. Electronically Signed   By: Telford Nab M.D.   On: 01/13/2021 02:17   CT Cervical Spine Wo Contrast  Result Date: 01/12/2021 CLINICAL DATA:  Found down, unwitnessed fall EXAM: CT HEAD WITHOUT CONTRAST CT MAXILLOFACIAL WITHOUT CONTRAST CT CERVICAL SPINE WITHOUT CONTRAST TECHNIQUE: Multidetector CT imaging of the head, cervical spine, and maxillofacial structures were performed using the standard protocol without intravenous contrast. Multiplanar CT image reconstructions of the cervical spine and maxillofacial structures were also generated. COMPARISON:  10/26/2020 FINDINGS: CT HEAD FINDINGS Brain: No evidence of acute infarction, hemorrhage, hydrocephalus, extra-axial collection or mass lesion/mass effect. Periventricular and deep white matter hypodensity. Vascular: No hyperdense vessel or unexpected calcification. CT FACIAL BONES FINDINGS Skull: Normal. Negative for fracture or focal lesion. Facial bones: Possible nondisplaced fractures of the nasal bones (series 5, image 60). No other displaced fractures or dislocations. Sinuses/Orbits: No acute finding. Other: None. CT CERVICAL SPINE FINDINGS Alignment: Normal. Skull base and vertebrae: No acute fracture. No primary bone lesion or focal pathologic process. Soft tissues and spinal canal: No prevertebral fluid or swelling. No visible canal hematoma. Disc levels:  Mild disc space height loss and osteophytosis. Upper chest: Negative. Other: None. IMPRESSION: 1. No acute intracranial pathology. Small-vessel white matter disease. 2. Possible nondisplaced fractures of the nasal bones. 3. No other displaced fractures or dislocations of the facial bones. 4. No fracture or static subluxation of the cervical spine. Electronically Signed   By: Delanna Ahmadi M.D.   On: 01/12/2021 13:23    DG CHEST PORT 1 VIEW  Result Date: 01/13/2021 CLINICAL DATA:  Leukocytosis EXAM: PORTABLE CHEST 1 VIEW COMPARISON:  03/19/2014 FINDINGS: Cardiac shadow is within normal limits. Extrinsic density is noted over the left apex when compared with recent CT examinations. Lungs are clear bilaterally. Patient rotation accentuates the mediastinal markings. Mild aortic calcifications are seen. No bony abnormality is noted. IMPRESSION: No acute abnormality seen. Electronically Signed   By: Inez Catalina M.D.   On: 01/13/2021 00:05   DG Humerus Right  Result Date: 01/12/2021 CLINICAL DATA:  fall, right shoulder pain EXAM: RIGHT SHOULDER - 2+ VIEW; RIGHT HUMERUS - 2+ VIEW COMPARISON:  None. FINDINGS: There is no evidence of fracture or dislocation. Severe degenerative changes of the right shoulder. Visualized portions of the right elbow grossly unremarkable with limited evaluation due to positioning/nondedicated view. Soft tissues are unremarkable. IMPRESSION: No acute displaced fracture or dislocation of the right humerus or shoulder. Electronically Signed   By: Iven Finn M.D.   On: 01/12/2021 18:55   CT Maxillofacial WO CM  Result Date: 01/12/2021 CLINICAL DATA:  Found down, unwitnessed fall EXAM: CT HEAD WITHOUT CONTRAST CT MAXILLOFACIAL WITHOUT CONTRAST CT CERVICAL SPINE WITHOUT CONTRAST TECHNIQUE: Multidetector CT imaging of the head, cervical spine, and maxillofacial structures were performed using the standard protocol without intravenous contrast. Multiplanar CT image reconstructions of the cervical spine and maxillofacial structures were also generated. COMPARISON:  10/26/2020 FINDINGS: CT HEAD FINDINGS Brain: No evidence of acute infarction, hemorrhage, hydrocephalus, extra-axial collection or mass lesion/mass effect. Periventricular and deep white matter hypodensity. Vascular: No hyperdense vessel or unexpected calcification. CT FACIAL BONES FINDINGS Skull: Normal. Negative for fracture or focal  lesion. Facial bones: Possible nondisplaced fractures of the nasal bones (series 5, image 60). No other displaced fractures or dislocations. Sinuses/Orbits: No acute finding. Other: None. CT CERVICAL SPINE FINDINGS Alignment: Normal. Skull base and vertebrae: No acute fracture. No primary bone lesion or focal pathologic process. Soft  tissues and spinal canal: No prevertebral fluid or swelling. No visible canal hematoma. Disc levels:  Mild disc space height loss and osteophytosis. Upper chest: Negative. Other: None. IMPRESSION: 1. No acute intracranial pathology. Small-vessel white matter disease. 2. Possible nondisplaced fractures of the nasal bones. 3. No other displaced fractures or dislocations of the facial bones. 4. No fracture or static subluxation of the cervical spine. Electronically Signed   By: Delanna Ahmadi M.D.   On: 01/12/2021 13:23    Procedures Procedures   Medications Ordered in ED Medications  lactated ringers infusion ( Intravenous New Bag/Given 01/12/21 2332)  sertraline (ZOLOFT) tablet 50 mg (has no administration in time range)  levothyroxine (SYNTHROID) tablet 100 mcg (has no administration in time range)  pantoprazole (PROTONIX) EC tablet 40 mg (has no administration in time range)  divalproex (DEPAKOTE ER) 24 hr tablet 500 mg (has no administration in time range)  acetaminophen (TYLENOL) tablet 650 mg (has no administration in time range)    Or  acetaminophen (TYLENOL) suppository 650 mg (has no administration in time range)  lactated ringers bolus 1,000 mL (0 mLs Intravenous Stopped 01/12/21 2113)  acetaminophen (TYLENOL) tablet 650 mg (650 mg Oral Given 01/12/21 1904)  iopamidol (ISOVUE-370) 76 % injection 75 mL (60 mLs Intravenous Contrast Given 01/13/21 0155)    ED Course  I have reviewed the triage vital signs and the nursing notes.  Pertinent labs & imaging results that were available during my care of the patient were reviewed by me and considered in my medical  decision making (see chart for details).    MDM Rules/Calculators/A&P                          Danielle Bryan is a 87 y.o. female who presented via EMS as an unleveled trauma s/p GLF.  Upon arrival of the patient, EMS and patient provided pertinent history and exam findings. ABCs intact as exam above during primary exam. Once PIV was established, the secondary exam was performed. Pertinent exam findings include right shoulder pain. eFAST exam was not performed. Sent for imaging with results as above.  Pertinent labs: UA with small ketones and protein, otherwise undetectable.  COVID/flu negative.  CK of 34.  TSH, mag and Phos WNL.  Mild leukocytosis with left shift and CMP unremarkable.  Medications: Medications  lactated ringers infusion ( Intravenous New Bag/Given 01/12/21 2332)  sertraline (ZOLOFT) tablet 50 mg (has no administration in time range)  levothyroxine (SYNTHROID) tablet 100 mcg (has no administration in time range)  pantoprazole (PROTONIX) EC tablet 40 mg (has no administration in time range)  divalproex (DEPAKOTE ER) 24 hr tablet 500 mg (has no administration in time range)  acetaminophen (TYLENOL) tablet 650 mg (has no administration in time range)    Or  acetaminophen (TYLENOL) suppository 650 mg (has no administration in time range)  lactated ringers bolus 1,000 mL (0 mLs Intravenous Stopped 01/12/21 2113)  acetaminophen (TYLENOL) tablet 650 mg (650 mg Oral Given 01/12/21 1904)  iopamidol (ISOVUE-370) 76 % injection 75 mL (60 mLs Intravenous Contrast Given 01/13/21 0155)    Significant findings include stable nondisplaced nasal bone fractures. Based on the patient's clinical picture, trauma surgery was consulted.  On re-evaluation, patient is resting in no acute distress.  Concerned that patient's generalized weakness represents failure to thrive and patient does not appear safe for discharge home.  She appears very clinically dehydrated, but has no focal findings concerning  for CVA or  hemorrhagic stroke.  Imaging largely negative for acute traumatic findings.  Patient's son reports significant concern that she is not safe at home and has had increasing falls recently and significantly downtrending p.o. intake.  I am concerned that this patient may sustain a more serious fall in the future given this decline as she lives at home alone.  Patient's left knee is warm to the touch without acute traumatic findings.  Unclear etiology and may require further work-up as an inpatient.  Low concern for septic joint or DVT at this time.  Admission to hospitalist in stable condition. Family updated. Patient understands and agrees to the plan. Transferred from the ED without issue.  Labs and imaging reviewed and considered in medical decision making if ordered.  Imaging interpreted by radiology and personally by me. The plan for this patient was discussed with my attending physician, who voiced agreement and who oversaw evaluation and treatment of this patient.    Note: Estate manager/land agent was used in the creation of this note.  Final Clinical Impression(s) / ED Diagnoses Final diagnoses:  Leukocytosis  Encephalopathy    Rx / DC Orders ED Discharge Orders     None        Cherly Hensen, DO 01/13/21 0240    Drenda Freeze, MD 01/13/21 [phone]

## 2021-01-12 NOTE — ED Triage Notes (Signed)
Pt to triage via [facility] from home.  Pt missed handrail when getting out of bed and fell during the night.  Hit her nose and it was bleeding but has stopped.  Denies hitting her head.  Denies LOC.  Denies pain.  Takes Plavix.

## 2021-01-12 NOTE — H&P (Signed)
History and Physical    Danielle Bryan ZOX:[id number] DOB: [birth year] DOA: 01/12/2021  PCP: Velna Hatchet, MD Patient coming from: Home  Chief Complaint: Fall  HPI: Danielle Bryan is a 87 y.o. female with medical history significant of hypertension, hyperlipidemia, hypothyroidism, osteoarthritis, stroke/TIA on Plavix, seizure disorder, GERD, depression presenting to the ED via EMS for evaluation of nose injury/bleed after a mechanical fall.  Family reported poor appetite and multiple recent falls due to generalized weakness.  Patient lives alone.  In the ED, slightly tachycardic but afebrile.  Not hypoxic.  Labs showing WBC 11.9, hemoglobin 12.8, platelet count 140k.  Sodium 138, potassium 3.8, chloride 99, bicarb 27, BUN 20, creatinine 0.7, glucose 134.  LFTs normal.  TSH normal.  CK 34.  COVID and influenza PCR negative.  UA pending.  EKG without acute changes.  CT maxillofacial showing possible nondisplaced fractures of the nasal bones.  Remainder of trauma imaging including CT head/C-spine, right shoulder x-ray, right humerus x-ray, and left knee x-ray negative for acute finding. Patient was given Tylenol and 1 L LR bolus.  Patient is very confused and not able to give any history.  Oriented to self only.  Review of Systems:  All systems reviewed and apart from history of presenting illness, are negative.  Past Medical History:  Diagnosis Date   Hypercholesterolemia    Hypertension    Hypothyroidism    Osteoarthritis    Reactive depression (situational)    Stroke ([place])    TIA (transient ischemic attack)    on plavix    Past Surgical History:  Procedure Laterality Date   CATARACT EXTRACTION Left 1992   CATARACT EXTRACTION Right 1995   DEBRIDEMENT AND CLOSURE WOUND       reports that she has never smoked. She has never used smokeless tobacco. She reports that she does not drink alcohol and does not use drugs.  Allergies  Allergen Reactions   Escitalopram Oxalate Nausea Only    Lisinopril Other (See Comments)    Very decreased blood pressure    Statins Other (See Comments)    myalgias   Sulfa Antibiotics Hives    Family History  Problem Relation Age of Onset   Heart failure Mother    Pneumonia Mother    Heart failure Father     Prior to Admission medications   Medication Sig Start Date End Date Taking? Authorizing Provider  acetaminophen (TYLENOL) 500 MG tablet Take 1,000 mg by mouth See admin instructions. Take 2 tablets (1000 mg) by mouth every morning, may also take 2 tablets (1000 mg) in the evening as needed for headache/pain   Yes [provider]  amLODipine (NORVASC) 5 MG tablet Take 5 mg by mouth every morning. 03/18/16  Yes [provider]  Ascorbic Acid (VITAMIN C PO) Take 1 tablet by mouth every evening.   Yes [provider]  CALCIUM CARB-CHOLECALCIFEROL PO Take 1 tablet by mouth every evening.   Yes [provider]  Cholecalciferol (VITAMIN D3 PO) Take 1 tablet by mouth every evening.   Yes [provider]  clopidogrel (PLAVIX) 75 MG tablet Take 1 tablet (75 mg total) by mouth daily. Patient taking differently: Take 75 mg by mouth every morning. 03/21/14  Yes Velna Hatchet, MD  divalproex (DEPAKOTE ER) 500 MG 24 hr tablet TAKE 1 TABLET BY MOUTH EVERY DAY Patient taking differently: 500 mg every morning. 04/19/20  Yes McCue, Janett Billow, NP  levothyroxine (SYNTHROID, LEVOTHROID) 100 MCG tablet TAKE 1 TABLET (100 MCG TOTAL)  BY MOUTH DAILY. TAKING 6 DAYS A WEEK. DON'T TAKE ON SUNDAYS Patient taking differently: Take 100 mcg by mouth See admin instructions. Take one tablet (100 mcg) by mouth 6 days weekly in the morning, skip Sundays   Yes Darlin Coco, MD  pantoprazole (PROTONIX) 40 MG tablet Take 40 mg by mouth every evening.   Yes [provider]  sertraline (ZOLOFT) 25 MG tablet Take 50 mg by mouth every morning. 11/04/20  Yes [provider]  triamcinolone cream (KENALOG) 0.5 % Apply 1  application topically daily as needed (rash/irritation).   Yes [provider]  Trolamine Salicylate (ASPERCREME EX) Apply 1 application topically daily as needed (knee pain).   Yes [provider]  Pitavastatin Calcium (LIVALO) 2 MG TABS Take 2 tablets (4 mg total) by mouth once a week. Patient not taking: Reported on 01/12/2021 01/30/19   Raiford Noble Latif, DO  sertraline (ZOLOFT) 50 MG tablet Take 1 tablet (50 mg total) by mouth daily. Patient not taking: Reported on 01/12/2021 06/28/20   Frann Rider, NP    Physical Exam: Vitals:   01/12/21 1828 01/12/21 2000 01/12/21 2100 01/12/21 2300  BP: (!) 176/68 (!) 129/55 (!) 144/65 (!) 143/59  Pulse: 99 (!) 101 (!) 102 (!) 102  Resp: 16 20 (!) 21 (!) 23  Temp:      TempSrc:      SpO2: 96% 96% 96% 100%    Physical Exam Constitutional:      General: She is not in acute distress. HENT:     Head: Normocephalic and atraumatic.     Mouth/Throat:     Mouth: Mucous membranes are dry.  Eyes:     Extraocular Movements: Extraocular movements intact.     Conjunctiva/sclera: Conjunctivae normal.  Cardiovascular:     Rate and Rhythm: Regular rhythm. Tachycardia present.     Pulses: Normal pulses.     Comments: Slightly tachycardic Pulmonary:     Effort: Pulmonary effort is normal. No respiratory distress.     Breath sounds: Normal breath sounds. No wheezing or rales.  Abdominal:     General: Bowel sounds are normal. There is no distension.     Palpations: Abdomen is soft.     Tenderness: There is no abdominal tenderness.  Musculoskeletal:        General: No swelling or tenderness.     Cervical back: Normal range of motion and neck supple.  Skin:    General: Skin is warm and dry.  Neurological:     Mental Status: She is alert.     Cranial Nerves: No cranial nerve deficit.     Sensory: No sensory deficit.     Motor: No weakness.     Comments: Very confused Oriented to self only     Labs on Admission: I have  personally reviewed following labs and imaging studies  CBC: Recent Labs  Lab 01/12/21 1740  WBC 11.9*  NEUTROABS 10.1*  HGB 12.8  HCT 40.1  MCV 92.0  PLT 948*   Basic Metabolic Panel: Recent Labs  Lab 01/12/21 1740  NA 138  K 3.8  CL 99  CO2 27  GLUCOSE 134*  BUN 20  CREATININE 0.70  CALCIUM 9.4  MG 2.0  PHOS 3.0   GFR: CrCl cannot be calculated (Unknown ideal weight.). Liver Function Tests: Recent Labs  Lab 01/12/21 1740  AST 17  ALT 8  ALKPHOS 54  BILITOT 0.9  PROT 6.9  ALBUMIN 3.6   No results for input(s): LIPASE,  AMYLASE in the last 168 hours. No results for input(s): AMMONIA in the last 168 hours. Coagulation Profile: No results for input(s): INR, PROTIME in the last 168 hours. Cardiac Enzymes: Recent Labs  Lab 01/12/21 1755  CKTOTAL 34*   BNP (last 3 results) No results for input(s): PROBNP in the last 8760 hours. HbA1C: No results for input(s): HGBA1C in the last 72 hours. CBG: No results for input(s): GLUCAP in the last 168 hours. Lipid Profile: No results for input(s): CHOL, HDL, LDLCALC, TRIG, CHOLHDL, LDLDIRECT in the last 72 hours. Thyroid Function Tests: Recent Labs    01/12/21 1740  TSH 1.273   Anemia Panel: No results for input(s): VITAMINB12, FOLATE, FERRITIN, TIBC, IRON, RETICCTPCT in the last 72 hours. Urine analysis:    Component Value Date/Time   COLORURINE YELLOW 01/12/2021 2239   APPEARANCEUR CLEAR 01/12/2021 2239   LABSPEC 1.024 01/12/2021 2239   PHURINE 5.0 01/12/2021 2239   GLUCOSEU NEGATIVE 01/12/2021 2239   HGBUR NEGATIVE 01/12/2021 2239   BILIRUBINUR NEGATIVE 01/12/2021 2239   KETONESUR 20 (A) 01/12/2021 2239   PROTEINUR 100 (A) 01/12/2021 2239   UROBILINOGEN 0.2 03/21/2014 1209   NITRITE NEGATIVE 01/12/2021 2239   LEUKOCYTESUR NEGATIVE 01/12/2021 2239    Radiological Exams on Admission: DG Shoulder Right  Result Date: 01/12/2021 CLINICAL DATA:  fall, right shoulder pain EXAM: RIGHT SHOULDER - 2+  VIEW; RIGHT HUMERUS - 2+ VIEW COMPARISON:  None. FINDINGS: There is no evidence of fracture or dislocation. Severe degenerative changes of the right shoulder. Visualized portions of the right elbow grossly unremarkable with limited evaluation due to positioning/nondedicated view. Soft tissues are unremarkable. IMPRESSION: No acute displaced fracture or dislocation of the right humerus or shoulder. Electronically Signed   By: Iven Finn M.D.   On: 01/12/2021 18:55   DG Knee 2 Views Left  Result Date: 01/12/2021 CLINICAL DATA:  Fall EXAM: LEFT KNEE - 1-2 VIEW COMPARISON:  None. FINDINGS: No evidence of acute fracture or malalignment. Trace knee joint effusion, nonspecific. Severe tricompartmental osteoarthritis, most advanced in the lateral compartment. Diffuse bony demineralization. Soft tissue swelling is seen about the knee. Advanced atherosclerotic vascular calcifications. IMPRESSION: 1. No acute fracture or malalignment. 2. Severe tricompartmental osteoarthritis, most advanced in the lateral compartment. Electronically Signed   By: Davina Poke D.O.   On: 01/12/2021 18:59   CT Head Wo Contrast  Result Date: 01/12/2021 CLINICAL DATA:  Found down, unwitnessed fall EXAM: CT HEAD WITHOUT CONTRAST CT MAXILLOFACIAL WITHOUT CONTRAST CT CERVICAL SPINE WITHOUT CONTRAST TECHNIQUE: Multidetector CT imaging of the head, cervical spine, and maxillofacial structures were performed using the standard protocol without intravenous contrast. Multiplanar CT image reconstructions of the cervical spine and maxillofacial structures were also generated. COMPARISON:  10/26/2020 FINDINGS: CT HEAD FINDINGS Brain: No evidence of acute infarction, hemorrhage, hydrocephalus, extra-axial collection or mass lesion/mass effect. Periventricular and deep white matter hypodensity. Vascular: No hyperdense vessel or unexpected calcification. CT FACIAL BONES FINDINGS Skull: Normal. Negative for fracture or focal lesion. Facial  bones: Possible nondisplaced fractures of the nasal bones (series 5, image 60). No other displaced fractures or dislocations. Sinuses/Orbits: No acute finding. Other: None. CT CERVICAL SPINE FINDINGS Alignment: Normal. Skull base and vertebrae: No acute fracture. No primary bone lesion or focal pathologic process. Soft tissues and spinal canal: No prevertebral fluid or swelling. No visible canal hematoma. Disc levels:  Mild disc space height loss and osteophytosis. Upper chest: Negative. Other: None. IMPRESSION: 1. No acute intracranial pathology. Small-vessel white matter disease. 2. Possible nondisplaced  fractures of the nasal bones. 3. No other displaced fractures or dislocations of the facial bones. 4. No fracture or static subluxation of the cervical spine. Electronically Signed   By: Delanna Ahmadi M.D.   On: 01/12/2021 13:23   CT Cervical Spine Wo Contrast  Result Date: 01/12/2021 CLINICAL DATA:  Found down, unwitnessed fall EXAM: CT HEAD WITHOUT CONTRAST CT MAXILLOFACIAL WITHOUT CONTRAST CT CERVICAL SPINE WITHOUT CONTRAST TECHNIQUE: Multidetector CT imaging of the head, cervical spine, and maxillofacial structures were performed using the standard protocol without intravenous contrast. Multiplanar CT image reconstructions of the cervical spine and maxillofacial structures were also generated. COMPARISON:  10/26/2020 FINDINGS: CT HEAD FINDINGS Brain: No evidence of acute infarction, hemorrhage, hydrocephalus, extra-axial collection or mass lesion/mass effect. Periventricular and deep white matter hypodensity. Vascular: No hyperdense vessel or unexpected calcification. CT FACIAL BONES FINDINGS Skull: Normal. Negative for fracture or focal lesion. Facial bones: Possible nondisplaced fractures of the nasal bones (series 5, image 60). No other displaced fractures or dislocations. Sinuses/Orbits: No acute finding. Other: None. CT CERVICAL SPINE FINDINGS Alignment: Normal. Skull base and vertebrae: No acute  fracture. No primary bone lesion or focal pathologic process. Soft tissues and spinal canal: No prevertebral fluid or swelling. No visible canal hematoma. Disc levels:  Mild disc space height loss and osteophytosis. Upper chest: Negative. Other: None. IMPRESSION: 1. No acute intracranial pathology. Small-vessel white matter disease. 2. Possible nondisplaced fractures of the nasal bones. 3. No other displaced fractures or dislocations of the facial bones. 4. No fracture or static subluxation of the cervical spine. Electronically Signed   By: Delanna Ahmadi M.D.   On: 01/12/2021 13:23   DG Humerus Right  Result Date: 01/12/2021 CLINICAL DATA:  fall, right shoulder pain EXAM: RIGHT SHOULDER - 2+ VIEW; RIGHT HUMERUS - 2+ VIEW COMPARISON:  None. FINDINGS: There is no evidence of fracture or dislocation. Severe degenerative changes of the right shoulder. Visualized portions of the right elbow grossly unremarkable with limited evaluation due to positioning/nondedicated view. Soft tissues are unremarkable. IMPRESSION: No acute displaced fracture or dislocation of the right humerus or shoulder. Electronically Signed   By: Iven Finn M.D.   On: 01/12/2021 18:55   CT Maxillofacial WO CM  Result Date: 01/12/2021 CLINICAL DATA:  Found down, unwitnessed fall EXAM: CT HEAD WITHOUT CONTRAST CT MAXILLOFACIAL WITHOUT CONTRAST CT CERVICAL SPINE WITHOUT CONTRAST TECHNIQUE: Multidetector CT imaging of the head, cervical spine, and maxillofacial structures were performed using the standard protocol without intravenous contrast. Multiplanar CT image reconstructions of the cervical spine and maxillofacial structures were also generated. COMPARISON:  10/26/2020 FINDINGS: CT HEAD FINDINGS Brain: No evidence of acute infarction, hemorrhage, hydrocephalus, extra-axial collection or mass lesion/mass effect. Periventricular and deep white matter hypodensity. Vascular: No hyperdense vessel or unexpected calcification. CT FACIAL  BONES FINDINGS Skull: Normal. Negative for fracture or focal lesion. Facial bones: Possible nondisplaced fractures of the nasal bones (series 5, image 60). No other displaced fractures or dislocations. Sinuses/Orbits: No acute finding. Other: None. CT CERVICAL SPINE FINDINGS Alignment: Normal. Skull base and vertebrae: No acute fracture. No primary bone lesion or focal pathologic process. Soft tissues and spinal canal: No prevertebral fluid or swelling. No visible canal hematoma. Disc levels:  Mild disc space height loss and osteophytosis. Upper chest: Negative. Other: None. IMPRESSION: 1. No acute intracranial pathology. Small-vessel white matter disease. 2. Possible nondisplaced fractures of the nasal bones. 3. No other displaced fractures or dislocations of the facial bones. 4. No fracture or static subluxation of the cervical  spine. Electronically Signed   By: Delanna Ahmadi M.D.   On: 01/12/2021 13:23    EKG: Independently reviewed.  Sinus rhythm, baseline wander in V6.  No significant change since prior tracing.  Assessment/Plan Principal Problem:   Acute metabolic encephalopathy Active Problems:   Hypothyroidism   Dehydration   Fall   Nasal bone fractures   Acute metabolic encephalopathy Patient is very confused, oriented to self only.  CT head negative for acute finding.  No focal neurodeficit.  No meningeal signs.  UA without signs of infection.  COVID and influenza PCR negative.  TSH normal.  Dehydration likely contributing. -IV fluid hydration.  Given mild leukocytosis, chest x-ray ordered to rule out pneumonia.  Check B12 and ammonia levels.  Frequent falls Per ED documentation, fall today was mechanical but patient's family reported multiple recent falls.  She lives alone. ?Syncope, patient is currently very confused and not able to give any history.  EKG without evidence of arrhythmia or acute ischemic changes. -Cardiac monitoring, orthostatics, echo, EEG, and carotid Dopplers.  PE  less likely given no hypoxia, check D-dimer level.  PT/OT eval, fall precautions, and TOC consulted for SNF placement.  Dehydration Has dry mucous membranes and UA with ketones. -IV fluid hydration  Nondisplaced fractures of nasal bones In the setting of recent falls.  No epistaxis at this time. -Pain management.  Consult ENT in a.m.  Mild thrombocytopenia No epistaxis at this time, no other signs of bleeding. -Continue to monitor  Hypertension -Hold antihypertensives at this time, checking orthostatics.  Seizure disorder -Continue Depakote.  Check valproic acid level.  EEG ordered given encephalopathy and recent falls.  Hypothyroidism TSH normal. -Continue Synthroid  History of stroke/TIA -Hold Plavix at this time, pending ENT evaluation  GERD -Continue Protonix  Depression -Continue Zoloft  DVT prophylaxis: Hold chemical DVT prophylaxis, pending ENT evaluation.  No SCDs until D-dimer is checked. Code Status: Full code Family Communication: No family available at this time. Disposition Plan: Status is: Observation  The patient remains OBS appropriate and will d/c before 2 midnights.  Level of care: Level of care: Telemetry Cardiac  The medical decision making on this patient was of high complexity and the patient is at high risk for clinical deterioration, therefore this is a level 3 visit.  Shela Leff MD [facility]  If 7PM-7AM, please contact night-coverage [email or web address]  01/12/2021, 11:40 PM

## 2021-01-13 ENCOUNTER — Observation Stay (HOSPITAL_BASED_OUTPATIENT_CLINIC_OR_DEPARTMENT_OTHER): Payer: Medicare HMO

## 2021-01-13 ENCOUNTER — Observation Stay (HOSPITAL_COMMUNITY): Payer: Medicare HMO

## 2021-01-13 ENCOUNTER — Encounter (HOSPITAL_COMMUNITY): Payer: Self-pay | Admitting: Internal Medicine

## 2021-01-13 DIAGNOSIS — I517 Cardiomegaly: Secondary | ICD-10-CM | POA: Diagnosis not present

## 2021-01-13 DIAGNOSIS — R55 Syncope and collapse: Secondary | ICD-10-CM

## 2021-01-13 DIAGNOSIS — J90 Pleural effusion, not elsewhere classified: Secondary | ICD-10-CM | POA: Diagnosis not present

## 2021-01-13 DIAGNOSIS — Z515 Encounter for palliative care: Secondary | ICD-10-CM

## 2021-01-13 DIAGNOSIS — E860 Dehydration: Secondary | ICD-10-CM | POA: Diagnosis not present

## 2021-01-13 DIAGNOSIS — Z66 Do not resuscitate: Secondary | ICD-10-CM

## 2021-01-13 DIAGNOSIS — G9341 Metabolic encephalopathy: Secondary | ICD-10-CM | POA: Diagnosis not present

## 2021-01-13 DIAGNOSIS — I3139 Other pericardial effusion (noninflammatory): Secondary | ICD-10-CM | POA: Diagnosis not present

## 2021-01-13 DIAGNOSIS — S022XXA Fracture of nasal bones, initial encounter for closed fracture: Secondary | ICD-10-CM

## 2021-01-13 DIAGNOSIS — E041 Nontoxic single thyroid nodule: Secondary | ICD-10-CM | POA: Diagnosis not present

## 2021-01-13 DIAGNOSIS — Z7189 Other specified counseling: Secondary | ICD-10-CM | POA: Diagnosis not present

## 2021-01-13 DIAGNOSIS — Z789 Other specified health status: Secondary | ICD-10-CM

## 2021-01-13 DIAGNOSIS — D72829 Elevated white blood cell count, unspecified: Secondary | ICD-10-CM | POA: Diagnosis not present

## 2021-01-13 DIAGNOSIS — K449 Diaphragmatic hernia without obstruction or gangrene: Secondary | ICD-10-CM | POA: Diagnosis not present

## 2021-01-13 DIAGNOSIS — R531 Weakness: Secondary | ICD-10-CM

## 2021-01-13 DIAGNOSIS — Z711 Person with feared health complaint in whom no diagnosis is made: Secondary | ICD-10-CM | POA: Diagnosis not present

## 2021-01-13 DIAGNOSIS — W19XXXA Unspecified fall, initial encounter: Secondary | ICD-10-CM | POA: Diagnosis not present

## 2021-01-13 DIAGNOSIS — R638 Other symptoms and signs concerning food and fluid intake: Secondary | ICD-10-CM

## 2021-01-13 DIAGNOSIS — L8990 Pressure ulcer of unspecified site, unspecified stage: Secondary | ICD-10-CM | POA: Insufficient documentation

## 2021-01-13 LAB — CBG MONITORING, ED: Glucose-Capillary: 95 mg/dL (ref 70–99)

## 2021-01-13 LAB — CBC
HCT: 35.7 % — ABNORMAL LOW (ref 36.0–46.0)
Hemoglobin: 11.3 g/dL — ABNORMAL LOW (ref 12.0–15.0)
MCH: 29.1 pg (ref 26.0–34.0)
MCHC: 31.7 g/dL (ref 30.0–36.0)
MCV: 92 fL (ref 80.0–100.0)
Platelets: 123 10*3/uL — ABNORMAL LOW (ref 150–400)
RBC: 3.88 MIL/uL (ref 3.87–5.11)
RDW: 13.2 % (ref 11.5–15.5)
WBC: 9.9 10*3/uL (ref 4.0–10.5)
nRBC: 0 % (ref 0.0–0.2)

## 2021-01-13 LAB — ECHOCARDIOGRAM COMPLETE
Area-P 1/2: 4.29 cm2
S' Lateral: 2.2 cm

## 2021-01-13 LAB — VITAMIN B12: Vitamin B-12: 3020 pg/mL — ABNORMAL HIGH (ref 180–914)

## 2021-01-13 LAB — AMMONIA: Ammonia: 17 umol/L (ref 9–35)

## 2021-01-13 LAB — D-DIMER, QUANTITATIVE: D-Dimer, Quant: 3.13 ug/mL-FEU — ABNORMAL HIGH (ref 0.00–0.50)

## 2021-01-13 LAB — VALPROIC ACID LEVEL: Valproic Acid Lvl: 25 ug/mL — ABNORMAL LOW (ref 50.0–100.0)

## 2021-01-13 MED ORDER — IOPAMIDOL (ISOVUE-370) INJECTION 76%
75.0000 mL | Freq: Once | INTRAVENOUS | Status: AC | PRN
  Administered 2021-01-13: 60 mL via INTRAVENOUS

## 2021-01-13 MED ORDER — SODIUM CHLORIDE 0.9 % IV SOLN: INTRAVENOUS | Status: AC

## 2021-01-13 MED ORDER — CLOPIDOGREL BISULFATE 75 MG PO TABS
75.0000 mg | ORAL_TABLET | Freq: Every day | ORAL | Status: DC
  Administered 2021-01-14 – 2021-01-16 (×3): 75 mg via ORAL
  Filled 2021-01-13 (×3): qty 1

## 2021-01-13 MED ORDER — LABETALOL HCL 5 MG/ML IV SOLN
5.0000 mg | INTRAVENOUS | Status: DC | PRN
  Administered 2021-01-14: 5 mg via INTRAVENOUS
  Filled 2021-01-13: qty 4

## 2021-01-13 MED ORDER — ORAL CARE MOUTH RINSE
15.0000 mL | Freq: Two times a day (BID) | OROMUCOSAL | Status: DC
  Administered 2021-01-13 – 2021-01-16 (×6): 15 mL via OROMUCOSAL

## 2021-01-13 NOTE — Progress Notes (Signed)
  Echocardiogram 2D Echocardiogram has been performed.  Johny Chess 01/13/2021, 1:33 PM

## 2021-01-13 NOTE — Procedures (Signed)
Patient Name: Danielle Bryan  MRN: [id number]  Epilepsy Attending: Lora Havens  Referring Physician/Provider: Dr Derrick Ravel Date: 01/13/2021 Duration: 22.33 mins  Patient history: 87yo F with h/o epilepsy presented with ams, falls. EEG to evaluate for seizure  Level of alertness: Awake  AEDs during EEG study: VPA  Technical aspects: This EEG study was done with scalp electrodes positioned according to the 10-20 International system of electrode placement. Electrical activity was acquired at a sampling rate of 500Hz  and reviewed with a high frequency filter of 70Hz  and a low frequency filter of 1Hz . EEG data were recorded continuously and digitally stored.   Description: The posterior dominant rhythm consists of 8-9 Hz activity of moderate voltage (25-35 uV) seen predominantly in posterior head regions, symmetric and reactive to eye opening and eye closing. EEG showed intermittent generalized, at times sharply contoured  3 to 6 Hz theta-delta slowing. Hyperventilation and photic stimulation were not performed.     ABNORMALITY - Intermittent slow, generalized  IMPRESSION: This study is suggestive of mild diffuse encephalopathy, nonspecific etiology. No seizures or epileptiform discharges were seen throughout the recording.  [name]

## 2021-01-13 NOTE — Progress Notes (Signed)
PROGRESS NOTE    Danielle Bryan  ZOX:[id number] DOB: May 06, 1923 DOA: 01/12/2021 PCP: Velna Hatchet, MD    Chief Complaint  Patient presents with   Fall    Brief Narrative:  Danielle Bryan is a 87 y.o. female with medical history significant of hypertension, hyperlipidemia, hypothyroidism, osteoarthritis, stroke/TIA on Plavix, seizure disorder, GERD, depression presenting to the ED via EMS for evaluation of nose injury/bleed after a mechanical fall.  Family reported poor appetite and multiple recent falls due to generalized weakness, family also reported progressive confusion over the last year  Subjective:  She is very lethargic, only oriented to herself No agitation  Assessment & Plan:   Principal Problem:   Acute metabolic encephalopathy Active Problems:   Hypothyroidism   Dehydration   Fall   Nasal bone fractures  FTT/falls/increased confusion -Family reported progressive in the last year -CT head negative for acute finding.  No focal neurodeficit.  No meningeal signs.  UA without signs of infection.  COVID and influenza PCR negative.  TSH normal, B12 ammonia level unremarkable -echo lvef> 81%, grade 1 diastolic dysfunction,  -Cardiac ultrasound no clinical significant findings -CTA did not show PE, CT A showed several other findings, please refer to original reports -She does appear dehydrated, UA positive for ketone -Continue hydration, aspiration precaution, nutritional evaluation -Patient appears very weak, likely she will need skilled nursing facility placement, son agrees to SNF placement, will get PT /OT eval -Discussed with son regarding goals of care/CODE STATUS, currently full code , son will make think about it and he agreed to meet with palliative care, order placed   Nondisplaced nasal bone fracture Case discussed with ENT Dr. Janace Hoard who recommend conservative management, does not need ENT follow-up Resume plavix  Thrombocytopenia Mild, platelet 140 on  presentation, no active bleeding  repeat CBC in the morning  Hypertension Blood pressure elevated with intermittent mild tachycardia Resume Norvasc, add on as needed labetalol  Prior history of CVA Continue Plavix, report take Livalo once a week  Prior history of seizure Take a low dose Depakote at home, Depakote level 25   Hypothyroidism TSH unremarkable, continue Synthroid  Several CTA chest findings: -Small hiatal hernia with increasingly thickened distal thoracic esophagus. Consider endoscopic follow-up., will see how patient eat once her mental status improves -Severe thoracic kyphosis, osteopenia and degenerative change, and chronic appearing compression fractures in lower thoracic spine greatest at T10 -Three-vessel calcific CAD, small pericardial effusion, and moderate to heavy aortic mixed plaques with ulcerative plaque of the descending segment,  blood pressure stable  Pressure injury , presents on admission: Pressure Injury 01/13/21 Heel Left;Right Stage 1 -  Intact skin with non-blanchable redness of a localized area usually over a bony prominence. red, non blanchable (Active)  01/13/21 1508  Location: Heel  Location Orientation: Left;Right  Staging: Stage 1 -  Intact skin with non-blanchable redness of a localized area usually over a bony prominence.  Wound Description (Comments): red, non blanchable  Present on Admission: Yes       Pressure Injury 01/13/21 Heel Left;Right Stage 1 -  Intact skin with non-blanchable redness of a localized area usually over a bony prominence. red, non blanchable (Active)  01/13/21 1508  Location: Heel  Location Orientation: Left;Right  Staging: Stage 1 -  Intact skin with non-blanchable redness of a localized area usually over a bony prominence.  Wound Description (Comments): red, non blanchable  Present on Admission: Yes    Unresulted Labs (From admission, onward)  Start     Ordered   01/14/21 0500  CBC with  Differential/Platelet  Tomorrow morning,   R        01/13/21 1558   01/14/21 2956  Basic metabolic panel  Tomorrow morning,   R        01/13/21 1558   01/14/21 0500  Magnesium  Tomorrow morning,   STAT        01/13/21 1558      01/12/21 1748              DVT prophylaxis: scd's   Code Status: full code, son is going to think about it more, son agrees to talk to palliative care Family Communication: son [name] over the phone Disposition:   Status is: Observation  Dispo: The patient is from: home , lives alone with help twice a day              Anticipated d/c is to: likely will need SNF              Anticipated d/c date is: TBD, she is very lethargic, very weak, with poor oral intake, needing hydration, needs palliative care consult                Consultants:  Palliative care  Procedures:  none  Antimicrobials:   none     Objective: Vitals:   01/13/21 1404 01/13/21 1413 01/13/21 1500 01/13/21 1508  BP:  (!) 160/100  (!) 156/111  Pulse:  99  (!) 102  Resp:  20  16  Temp: 99 F (37.2 C)   97.6 F (36.4 C)  TempSrc: Oral   Oral  SpO2:  99%  95%  Weight:   53.3 kg   Height:   5\' 1"  (1.549 m)     Intake/Output Summary (Last 24 hours) at 01/13/2021 1732 Last data filed at 01/13/2021 1445 Gross per 24 hour  Intake 1000 ml  Output 600 ml  Net 400 ml   Filed Weights   01/13/21 1500  Weight: 53.3 kg    Examination:  General exam: Very weak, very lethargic, only oriented to self, no agitation Respiratory system: Clear to auscultation. Respiratory effort normal. Cardiovascular system:  RRR.  Gastrointestinal system: Abdomen is nondistended, soft and nontender.  Normal bowel sounds heard. Central nervous system: Lethargic, only oriented to self Extremities:  no edema Skin: No rashes, lesions or ulcers Psychiatry: Confused, lethargic, no agitation    Data Reviewed: I have personally reviewed following labs and imaging studies  CBC: Recent Labs   Lab 01/12/21 1740 01/13/21 0313  WBC 11.9* 9.9  NEUTROABS 10.1*  --   HGB 12.8 11.3*  HCT 40.1 35.7*  MCV 92.0 92.0  PLT 140* 123*    Basic Metabolic Panel: Recent Labs  Lab 01/12/21 1740  NA 138  K 3.8  CL 99  CO2 27  GLUCOSE 134*  BUN 20  CREATININE 0.70  CALCIUM 9.4  MG 2.0  PHOS 3.0    GFR: Estimated Creatinine Clearance: 31.7 mL/min (by C-G formula based on SCr of 0.7 mg/dL).  Liver Function Tests: Recent Labs  Lab 01/12/21 1740  AST 17  ALT 8  ALKPHOS 54  BILITOT 0.9  PROT 6.9  ALBUMIN 3.6    CBG: Recent Labs  Lab 01/13/21 1216  GLUCAP 95     Recent Results (from the past 240 hour(s))  Resp Panel by RT-PCR (Flu A&B, Covid) Nasopharyngeal Swab     Status: None   Collection Time:  01/12/21  7:45 PM   Specimen: Nasopharyngeal Swab; Nasopharyngeal(NP) swabs in vial transport medium  Result Value Ref Range Status   SARS Coronavirus 2 by RT PCR NEGATIVE NEGATIVE Final    Comment: (NOTE) SARS-CoV-2 target nucleic acids are NOT DETECTED.  The SARS-CoV-2 RNA is generally detectable in upper respiratory specimens during the acute phase of infection. The lowest concentration of SARS-CoV-2 viral copies this assay can detect is 138 copies/mL. A negative result does not preclude SARS-Cov-2 infection and should not be used as the sole basis for treatment or other patient management decisions. A negative result may occur with  improper specimen collection/handling, submission of specimen other than nasopharyngeal swab, presence of viral mutation(s) within the areas targeted by this assay, and inadequate number of viral copies(<138 copies/mL). A negative result must be combined with clinical observations, patient history, and epidemiological information. The expected result is Negative.  Fact Sheet for Patients:  EntrepreneurPulse.com.au  Fact Sheet for Healthcare Providers:  IncredibleEmployment.be  This test is  no t yet approved or cleared by the [place] FDA and  has been authorized for detection and/or diagnosis of SARS-CoV-2 by FDA under an Emergency Use Authorization (EUA). This EUA will remain  in effect (meaning this test can be used) for the duration of the COVID-19 declaration under Section 564(b)(1) of the Act, 21 U.S.C.section 360bbb-3(b)(1), unless the authorization is terminated  or revoked sooner.       Influenza A by PCR NEGATIVE NEGATIVE Final   Influenza B by PCR NEGATIVE NEGATIVE Final    Comment: (NOTE) The Xpert Xpress SARS-CoV-2/FLU/RSV plus assay is intended as an aid in the diagnosis of influenza from Nasopharyngeal swab specimens and should not be used as a sole basis for treatment. Nasal washings and aspirates are unacceptable for Xpert Xpress SARS-CoV-2/FLU/RSV testing.  Fact Sheet for Patients: EntrepreneurPulse.com.au  Fact Sheet for Healthcare Providers: IncredibleEmployment.be  This test is not yet approved or cleared by the [place] FDA and has been authorized for detection and/or diagnosis of SARS-CoV-2 by FDA under an Emergency Use Authorization (EUA). This EUA will remain in effect (meaning this test can be used) for the duration of the COVID-19 declaration under Section 564(b)(1) of the Act, 21 U.S.C. section 360bbb-3(b)(1), unless the authorization is terminated or revoked.  Performed at [facility], [place] [address] [place], [place] [address]          Radiology Studies: DG Shoulder Right  Result Date: 01/12/2021 CLINICAL DATA:  fall, right shoulder pain EXAM: RIGHT SHOULDER - 2+ VIEW; RIGHT HUMERUS - 2+ VIEW COMPARISON:  None. FINDINGS: There is no evidence of fracture or dislocation. Severe degenerative changes of the right shoulder. Visualized portions of the right elbow grossly unremarkable with limited evaluation due to positioning/nondedicated view. Soft tissues are unremarkable.  IMPRESSION: No acute displaced fracture or dislocation of the right humerus or shoulder. Electronically Signed   By: Iven Finn M.D.   On: 01/12/2021 18:55   DG Knee 2 Views Left  Result Date: 01/12/2021 CLINICAL DATA:  Fall EXAM: LEFT KNEE - 1-2 VIEW COMPARISON:  None. FINDINGS: No evidence of acute fracture or malalignment. Trace knee joint effusion, nonspecific. Severe tricompartmental osteoarthritis, most advanced in the lateral compartment. Diffuse bony demineralization. Soft tissue swelling is seen about the knee. Advanced atherosclerotic vascular calcifications. IMPRESSION: 1. No acute fracture or malalignment. 2. Severe tricompartmental osteoarthritis, most advanced in the lateral compartment. Electronically Signed   By: Davina Poke D.O.   On: 01/12/2021 18:59  CT Head Wo Contrast  Result Date: 01/12/2021 CLINICAL DATA:  Found down, unwitnessed fall EXAM: CT HEAD WITHOUT CONTRAST CT MAXILLOFACIAL WITHOUT CONTRAST CT CERVICAL SPINE WITHOUT CONTRAST TECHNIQUE: Multidetector CT imaging of the head, cervical spine, and maxillofacial structures were performed using the standard protocol without intravenous contrast. Multiplanar CT image reconstructions of the cervical spine and maxillofacial structures were also generated. COMPARISON:  10/26/2020 FINDINGS: CT HEAD FINDINGS Brain: No evidence of acute infarction, hemorrhage, hydrocephalus, extra-axial collection or mass lesion/mass effect. Periventricular and deep white matter hypodensity. Vascular: No hyperdense vessel or unexpected calcification. CT FACIAL BONES FINDINGS Skull: Normal. Negative for fracture or focal lesion. Facial bones: Possible nondisplaced fractures of the nasal bones (series 5, image 60). No other displaced fractures or dislocations. Sinuses/Orbits: No acute finding. Other: None. CT CERVICAL SPINE FINDINGS Alignment: Normal. Skull base and vertebrae: No acute fracture. No primary bone lesion or focal pathologic process.  Soft tissues and spinal canal: No prevertebral fluid or swelling. No visible canal hematoma. Disc levels:  Mild disc space height loss and osteophytosis. Upper chest: Negative. Other: None. IMPRESSION: 1. No acute intracranial pathology. Small-vessel white matter disease. 2. Possible nondisplaced fractures of the nasal bones. 3. No other displaced fractures or dislocations of the facial bones. 4. No fracture or static subluxation of the cervical spine. Electronically Signed   By: Delanna Ahmadi M.D.   On: 01/12/2021 13:23   CT Angio Chest Pulmonary Embolism (PE) W or WO Contrast  Result Date: 01/13/2021 CLINICAL DATA:  Suspected pulmonary embolism. Recent fall injury as well. EXAM: CT ANGIOGRAPHY CHEST WITH CONTRAST TECHNIQUE: Multidetector CT imaging of the chest was performed using the standard protocol during bolus administration of intravenous contrast. Multiplanar CT image reconstructions and MIPs were obtained to evaluate the vascular anatomy. CONTRAST:  35mL ISOVUE-370 IOPAMIDOL (ISOVUE-370) INJECTION 76% COMPARISON:  No prior chest CT for comparison. Last abdomen and pelvis CT is available for comparison, dated 01/11/2005. FINDINGS: Cardiovascular: The heart is slightly enlarged and there is a small pericardial effusion in the superior recess. The coronary arteries are increasingly heavily calcified and there is calcification in the posterior mitral ring. Pulmonary arteries are normal in caliber without evidence of thromboemboli. There are mildly distended superior pulmonary veins. There is no aortic aneurysm or dissection, there is moderate to heavy patchy mixed plaque in the arch and descending segments with moderate descending segment tortuosity and with scattered ulcerative plaque in the descending segment. Mediastinum/Nodes: Heterogeneous 1.2 cm nodule in the left lobe of the thyroid gland and subcentimeter hypodense nodule in the right lobe. There are mildly prominent lymph nodes in the subcarinal  mediastinum to the right up to 1.3 cm short axis. There is a small hiatal hernia and a moderately thickened distal thoracic esophagus, mild patulous appearance of the upper to the midthoracic esophagus. Lungs/Pleura: There are bilateral minimal layering pleural effusions. This was seen on prior study as well. There are subpleural chronic reticulated interstitial changes of lungs at all levels but with a basal predominance, consistent with UIP fibrosis pattern without honeycombing. Lungs are hyperinflated without evidence of nodule or consolidated infiltrate but there may be slight interstitial edema in the extreme lung bases. The trachea and central airways are clear. Upper Abdomen: No acute abnormality. Visualized abdominal aorta, proximal celiac artery and proximal SMA are heavily calcified. Musculoskeletal: There is pronounced thoracic kyphosis. There is osteopenia. There is a severe anterior wedge compression fracture of the T10 vertebral body with chronic appearance, mild wedging of the T8, T9, and T11  vertebrae, without retropulsion and with degenerative changes of the spine and shoulders. There is no displaced rib or sternal fracture or chest wall hematoma. Review of the MIP images confirms the above findings. IMPRESSION: 1. Cardiomegaly with mildly distended superior pulmonary veins and questionable slight interstitial edema in the base of the lungs with minimal pleural effusions. 2. No evidence of pulmonary arterial embolism or arterial dilatation. 3. Three-vessel calcific CAD, small pericardial effusion, and moderate to heavy aortic mixed plaques with ulcerative plaque of the descending segment. 4. Basal predominant subpleural interstitial reticulation. 5. 1.2 cm left thyroid nodule.  No imaging follow-up is required. 6. Small hiatal hernia with increasingly thickened distal thoracic esophagus. Consider endoscopic follow-up. 7. Mildly prominent subcarinal lymph nodes the right but no bulky or encasing  adenopathy or further significant lymph nodes. 8. Severe thoracic kyphosis, osteopenia and degenerative change, and chronic appearing compression fractures in lower thoracic spine greatest at T10. This was not seen on the prior abdomen pelvis CT. Electronically Signed   By: Telford Nab M.D.   On: 01/13/2021 02:17   CT Cervical Spine Wo Contrast  Result Date: 01/12/2021 CLINICAL DATA:  Found down, unwitnessed fall EXAM: CT HEAD WITHOUT CONTRAST CT MAXILLOFACIAL WITHOUT CONTRAST CT CERVICAL SPINE WITHOUT CONTRAST TECHNIQUE: Multidetector CT imaging of the head, cervical spine, and maxillofacial structures were performed using the standard protocol without intravenous contrast. Multiplanar CT image reconstructions of the cervical spine and maxillofacial structures were also generated. COMPARISON:  10/26/2020 FINDINGS: CT HEAD FINDINGS Brain: No evidence of acute infarction, hemorrhage, hydrocephalus, extra-axial collection or mass lesion/mass effect. Periventricular and deep white matter hypodensity. Vascular: No hyperdense vessel or unexpected calcification. CT FACIAL BONES FINDINGS Skull: Normal. Negative for fracture or focal lesion. Facial bones: Possible nondisplaced fractures of the nasal bones (series 5, image 60). No other displaced fractures or dislocations. Sinuses/Orbits: No acute finding. Other: None. CT CERVICAL SPINE FINDINGS Alignment: Normal. Skull base and vertebrae: No acute fracture. No primary bone lesion or focal pathologic process. Soft tissues and spinal canal: No prevertebral fluid or swelling. No visible canal hematoma. Disc levels:  Mild disc space height loss and osteophytosis. Upper chest: Negative. Other: None. IMPRESSION: 1. No acute intracranial pathology. Small-vessel white matter disease. 2. Possible nondisplaced fractures of the nasal bones. 3. No other displaced fractures or dislocations of the facial bones. 4. No fracture or static subluxation of the cervical spine.  Electronically Signed   By: Delanna Ahmadi M.D.   On: 01/12/2021 13:23   DG CHEST PORT 1 VIEW  Result Date: 01/13/2021 CLINICAL DATA:  Leukocytosis EXAM: PORTABLE CHEST 1 VIEW COMPARISON:  03/19/2014 FINDINGS: Cardiac shadow is within normal limits. Extrinsic density is noted over the left apex when compared with recent CT examinations. Lungs are clear bilaterally. Patient rotation accentuates the mediastinal markings. Mild aortic calcifications are seen. No bony abnormality is noted. IMPRESSION: No acute abnormality seen. Electronically Signed   By: Inez Catalina M.D.   On: 01/13/2021 00:05   DG Humerus Right  Result Date: 01/12/2021 CLINICAL DATA:  fall, right shoulder pain EXAM: RIGHT SHOULDER - 2+ VIEW; RIGHT HUMERUS - 2+ VIEW COMPARISON:  None. FINDINGS: There is no evidence of fracture or dislocation. Severe degenerative changes of the right shoulder. Visualized portions of the right elbow grossly unremarkable with limited evaluation due to positioning/nondedicated view. Soft tissues are unremarkable. IMPRESSION: No acute displaced fracture or dislocation of the right humerus or shoulder. Electronically Signed   By: Iven Finn M.D.   On: 01/12/2021 18:55  EEG adult  Result Date: 01/13/2021 Lora Havens, MD     01/13/2021 11:33 AM Patient Name: SUMMERLYN FICKEL MRN: [id number] Epilepsy Attending: Lora Havens Referring Physician/Provider: Dr Derrick Ravel Date: 01/13/2021 Duration: 22.33 mins Patient history: 87yo F with h/o epilepsy presented with ams, falls. EEG to evaluate for seizure Level of alertness: Awake AEDs during EEG study: VPA Technical aspects: This EEG study was done with scalp electrodes positioned according to the 10-20 International system of electrode placement. Electrical activity was acquired at a sampling rate of 500Hz  and reviewed with a high frequency filter of 70Hz  and a low frequency filter of 1Hz . EEG data were recorded continuously and digitally stored.  Description: The posterior dominant rhythm consists of 8-9 Hz activity of moderate voltage (25-35 uV) seen predominantly in posterior head regions, symmetric and reactive to eye opening and eye closing. EEG showed intermittent generalized, at times sharply contoured  3 to 6 Hz theta-delta slowing. Hyperventilation and photic stimulation were not performed.   ABNORMALITY - Intermittent slow, generalized IMPRESSION: This study is suggestive of mild diffuse encephalopathy, nonspecific etiology. No seizures or epileptiform discharges were seen throughout the recording. Lora Havens   ECHOCARDIOGRAM COMPLETE  Result Date: 01/13/2021    ECHOCARDIOGRAM REPORT   Patient Name:   DORE OQUIN Date of Exam: 01/13/2021 Medical Rec #:  [id number]    Height:       61.0 in Accession #:    [id number]   Weight:       125.0 lb Date of Birth:  [birth year]    BSA:          1.547 m Patient Age:    89 years     BP:           167/92 mmHg Patient Gender: F            HR:           101 bpm. Exam Location:  Inpatient Procedure: 2D Echo Indications:    syncope  History:        Patient has prior history of Echocardiogram examinations, most                 recent 01/27/2019. Risk Factors:Dyslipidemia and Hypertension.  Sonographer:    Johny Chess RDCS Referring Phys: [id number] Beggs  1. Left ventricular ejection fraction, by estimation, is >75%. The left ventricle has hyperdynamic function. The left ventricle has no regional wall motion abnormalities. Left ventricular diastolic parameters are consistent with Grade I diastolic dysfunction (impaired relaxation).  2. Right ventricular systolic function is normal. The right ventricular size is normal. There is normal pulmonary artery systolic pressure.  3. The mitral valve is normal in structure. No evidence of mitral valve regurgitation. No evidence of mitral stenosis. Moderate mitral annular calcification.  4. The aortic valve is tricuspid. Aortic valve  regurgitation is not visualized. Aortic valve sclerosis is present, with no evidence of aortic valve stenosis.  5. The inferior vena cava is normal in size with greater than 50% respiratory variability, suggesting right atrial pressure of 3 mmHg. FINDINGS  Left Ventricle: Left ventricular ejection fraction, by estimation, is >75%. The left ventricle has hyperdynamic function. The left ventricle has no regional wall motion abnormalities. The left ventricular internal cavity size was normal in size. There is no left ventricular hypertrophy. Left ventricular diastolic parameters are consistent with Grade I diastolic dysfunction (impaired relaxation). Right Ventricle: The right ventricular size is normal. Right ventricular systolic function is normal.  There is normal pulmonary artery systolic pressure. The tricuspid regurgitant velocity is 2.31 m/s, and with an assumed right atrial pressure of 3 mmHg,  the estimated right ventricular systolic pressure is 48.5 mmHg. Left Atrium: Left atrial size was normal in size. Right Atrium: Right atrial size was normal in size. Pericardium: There is no evidence of pericardial effusion. Mitral Valve: The mitral valve is normal in structure. Moderate mitral annular calcification. No evidence of mitral valve regurgitation. No evidence of mitral valve stenosis. Tricuspid Valve: The tricuspid valve is normal in structure. Tricuspid valve regurgitation is mild . No evidence of tricuspid stenosis. Aortic Valve: The aortic valve is tricuspid. Aortic valve regurgitation is not visualized. Aortic valve sclerosis is present, with no evidence of aortic valve stenosis. Pulmonic Valve: The pulmonic valve was normal in structure. Pulmonic valve regurgitation is not visualized. No evidence of pulmonic stenosis. Aorta: The aortic root is normal in size and structure. Venous: The inferior vena cava is normal in size with greater than 50% respiratory variability, suggesting right atrial pressure of 3  mmHg. IAS/Shunts: No atrial level shunt detected by color flow Doppler.  LEFT VENTRICLE PLAX 2D LVIDd:         3.50 cm   Diastology LVIDs:         2.20 cm   LV e' medial:    6.09 cm/s LV PW:         1.00 cm   LV E/e' medial:  12.0 LV IVS:        0.90 cm   LV e' lateral:   10.10 cm/s LVOT diam:     1.60 cm   LV E/e' lateral: 7.2 LV SV:         42 LV SV Index:   27 LVOT Area:     2.01 cm  RIGHT VENTRICLE RV S prime:     18.80 cm/s TAPSE (M-mode): 1.4 cm LEFT ATRIUM             Index        RIGHT ATRIUM          Index LA diam:        2.80 cm 1.81 cm/m   RA Area:     9.54 cm LA Vol (A2C):   24.4 ml 15.77 ml/m  RA Volume:   19.40 ml 12.54 ml/m LA Vol (A4C):   28.3 ml 18.30 ml/m LA Biplane Vol: 27.3 ml 17.65 ml/m  AORTIC VALVE LVOT Vmax:   136.00 cm/s LVOT Vmean:  93.600 cm/s LVOT VTI:    0.207 m  AORTA Ao Asc diam: 3.10 cm MITRAL VALVE                TRICUSPID VALVE MV Area (PHT): 4.29 cm     TR Peak grad:   21.3 mmHg MV Decel Time: 177 msec     TR Vmax:        231.00 cm/s MV E velocity: 72.80 cm/s MV A velocity: 134.00 cm/s  SHUNTS MV E/A ratio:  0.54         Systemic VTI:  0.21 m                             Systemic Diam: 1.60 cm Kirk Ruths MD Electronically signed by Kirk Ruths MD Signature Date/Time: 01/13/2021/1:47:43 PM    Final    VAS US CAROTID  Result Date: 01/13/2021 Carotid Arterial Duplex Study Patient Name:  LYNLEE STRATTON  Date of Exam:   01/13/2021 Medical Rec #: [id number]     Accession #:    [id number] Date of Birth: December 28, 1923     Patient Gender: F Patient Age:   52 years Exam Location:  [facility] Procedure:      VAS US CAROTID Referring Phys: Wandra Feinstein RATHORE --------------------------------------------------------------------------------  Indications:       Syncope and Multiple falls, confusion. Risk Factors:      Hypertension, hyperlipidemia, prior CVA. Comparison Study:  Prior Carotid duplex indicating 1-39% ICA stenosis was done                    03/20/2016 Performing  Technologist: Sharion Dove RVS  Examination Guidelines: A complete evaluation includes B-mode imaging, spectral Doppler, color Doppler, and power Doppler as needed of all accessible portions of each vessel. Bilateral testing is considered an integral part of a complete examination. Limited examinations for reoccurring indications may be performed as noted.  Right Carotid Findings: +----------+--------+--------+--------+------------------+------------------+           PSV cm/sEDV cm/sStenosisPlaque DescriptionComments           +----------+--------+--------+--------+------------------+------------------+ CCA Prox  69      10                                intimal thickening +----------+--------+--------+--------+------------------+------------------+ CCA Distal67      8                                 intimal thickening +----------+--------+--------+--------+------------------+------------------+ ICA Prox  62      15              calcific          Shadowing          +----------+--------+--------+--------+------------------+------------------+ ICA Distal72      15                                                   +----------+--------+--------+--------+------------------+------------------+ ECA       116     12                                                   +----------+--------+--------+--------+------------------+------------------+ +----------+--------+-------+--------+-------------------+           PSV cm/sEDV cmsDescribeArm Pressure (mmHG) +----------+--------+-------+--------+-------------------+ [id number]                                         +----------+--------+-------+--------+-------------------+ +---------+--------+--+--------+--+ VertebralPSV cm/s39EDV cm/s11 +---------+--------+--+--------+--+  Left Carotid Findings: +----------+--------+--------+--------+------------------+------------------+           PSV cm/sEDV  cm/sStenosisPlaque DescriptionComments           +----------+--------+--------+--------+------------------+------------------+ CCA Prox  61      8                                 intimal thickening +----------+--------+--------+--------+------------------+------------------+ CCA Distal67      10  intimal thickening +----------+--------+--------+--------+------------------+------------------+ ICA Prox  99      17              calcific                             +----------+--------+--------+--------+------------------+------------------+ ICA Distal73      13                                                   +----------+--------+--------+--------+------------------+------------------+ ECA       263     4               calcific                             +----------+--------+--------+--------+------------------+------------------+ +----------+--------+--------+--------+-------------------+           PSV cm/sEDV cm/sDescribeArm Pressure (mmHG) +----------+--------+--------+--------+-------------------+ [id number]                                          +----------+--------+--------+--------+-------------------+ +---------+--------+--+--------+-+ VertebralPSV cm/s33EDV cm/s6 +---------+--------+--+--------+-+   Summary: Right Carotid: Velocities in the right ICA are consistent with a 1-39% stenosis. Left Carotid: There is no evidence of stenosis in the left ICA. Vertebrals:  Bilateral vertebral arteries demonstrate antegrade flow. Subclavians: Normal flow hemodynamics were seen in bilateral subclavian              arteries. *See table(s) above for measurements and observations.  Electronically signed by Jamelle Haring on 01/13/2021 at 11:58:39 AM.    Final    CT Maxillofacial WO CM  Result Date: 01/12/2021 CLINICAL DATA:  Found down, unwitnessed fall EXAM: CT HEAD WITHOUT CONTRAST CT MAXILLOFACIAL WITHOUT CONTRAST CT CERVICAL  SPINE WITHOUT CONTRAST TECHNIQUE: Multidetector CT imaging of the head, cervical spine, and maxillofacial structures were performed using the standard protocol without intravenous contrast. Multiplanar CT image reconstructions of the cervical spine and maxillofacial structures were also generated. COMPARISON:  10/26/2020 FINDINGS: CT HEAD FINDINGS Brain: No evidence of acute infarction, hemorrhage, hydrocephalus, extra-axial collection or mass lesion/mass effect. Periventricular and deep white matter hypodensity. Vascular: No hyperdense vessel or unexpected calcification. CT FACIAL BONES FINDINGS Skull: Normal. Negative for fracture or focal lesion. Facial bones: Possible nondisplaced fractures of the nasal bones (series 5, image 60). No other displaced fractures or dislocations. Sinuses/Orbits: No acute finding. Other: None. CT CERVICAL SPINE FINDINGS Alignment: Normal. Skull base and vertebrae: No acute fracture. No primary bone lesion or focal pathologic process. Soft tissues and spinal canal: No prevertebral fluid or swelling. No visible canal hematoma. Disc levels:  Mild disc space height loss and osteophytosis. Upper chest: Negative. Other: None. IMPRESSION: 1. No acute intracranial pathology. Small-vessel white matter disease. 2. Possible nondisplaced fractures of the nasal bones. 3. No other displaced fractures or dislocations of the facial bones. 4. No fracture or static subluxation of the cervical spine. Electronically Signed   By: Delanna Ahmadi M.D.   On: 01/12/2021 13:23        Scheduled Meds:  clopidogrel  75 mg Oral Daily   divalproex  500 mg Oral q morning   [START ON 01/14/2021] levothyroxine  100 mcg Oral Once per day on Mon  Tue Wed Thu Fri Sat   pantoprazole  40 mg Oral QPM   sertraline  50 mg Oral Daily   Continuous Infusions:  sodium chloride 75 mL/hr at 01/13/21 1622     LOS: 0 days   Time spent: 8mins Greater than 50% of this time was spent in counseling, explanation of  diagnosis, planning of further management, and coordination of care.   Voice Recognition Viviann Spare dictation system was used to create this note, attempts have been made to correct errors. Please contact the author with questions and/or clarifications.   Florencia Reasons, MD PhD FACP [facility]  Available via Epic secure chat 7am-7pm for nonurgent issues Please page for urgent issues To page the attending provider between 7A-7P or the covering provider during after hours 7P-7A, please log into the web site [email or web address] and access using universal [place] password for that web site. If you do not have the password, please call the hospital operator.    01/13/2021, 5:32 PM

## 2021-01-13 NOTE — Progress Notes (Signed)
Patient arrived to unit 3E23 in NAD, VS stable and patient free from pain. Patient oriented to room, call bell in reach, bed alarm on and son at bedside.

## 2021-01-13 NOTE — Progress Notes (Signed)
EEG completed, results pending. 

## 2021-01-13 NOTE — Evaluation (Signed)
Physical Therapy Evaluation Patient Details Name: Danielle Bryan MRN: [id number] DOB: [birth year] Today's Date: 01/13/2021  History of Present Illness  Danielle Bryan is a 87 y.o. female presenting to the ED via EMS for evaluation of nose injury/bleed after a mechanical fall.  Family reported poor appetite and multiple recent falls due to generalized weakness. Patient lives alone.CT maxillofacial showing possible nondisplaced fractures of the nasal bones; all other imaging negative.  PHMx: hypertension, hyperlipidemia, hypothyroidism, osteoarthritis, stroke/TIA on Plavix, seizure disorder, GERD, depression.  Clinical Impression  Pt presents to PT with deficits in functional mobility, gait, balance, power, endurance, strength, memory. Pt is generally weak and fatigues quickly, requiring assistance for all mobility and demonstrating a posterior lean in standing at this time. Pt unable to attempt ambulation due to fatigue and a fear of falling. Pt will benefit from continued acute PT services to improve activity tolerance and reduce falls risk. PT recommends SNF placement at this time. If family refuse SNF placement the pt will benefit from HHPT, a manual wheelchair, 3in1 commode, and assistance for all out of bed mobility.     Recommendations for follow up therapy are one component of a multi-disciplinary discharge planning process, led by the attending physician.  Recommendations may be updated based on patient status, additional functional criteria and insurance authorization.  Follow Up Recommendations Skilled nursing-short term rehab (<3 hours/day)    Assistance Recommended at Discharge Frequent or constant Supervision/Assistance  Functional Status Assessment Patient has had a recent decline in their functional status and demonstrates the ability to make significant improvements in function in a reasonable and predictable amount of time.  Equipment Recommendations  Wheelchair (measurements  PT);Wheelchair cushion (measurements PT);BSC/3in1    Recommendations for Other Services       Precautions / Restrictions Precautions Precautions: Fall Restrictions Weight Bearing Restrictions: No      Mobility  Bed Mobility Overal bed mobility: Needs Assistance Bed Mobility: Supine to Sit;Sit to Supine     Supine to sit: Min assist Sit to supine: Min assist        Transfers Overall transfer level: Needs assistance Equipment used: Rolling walker (2 wheels) Transfers: Sit to/from Stand Sit to Stand: Min assist;+2 physical assistance           General transfer comment: Pt unable to take steps due to reporting she needed to sit down due to felt like she was going to fall    Ambulation/Gait                  Stairs            Wheelchair Mobility    Modified Rankin (Stroke Patients Only)       Balance Overall balance assessment: Needs assistance Sitting-balance support: Bilateral upper extremity supported;Feet supported Sitting balance-Leahy Scale: Poor     Standing balance support: Bilateral upper extremity supported Standing balance-Leahy Scale: Poor Standing balance comment: brief period of standing                             Pertinent Vitals/Pain Pain Assessment: Faces Faces Pain Scale: Hurts even more Pain Location: legs Pain Descriptors / Indicators: Grimacing Pain Intervention(s): Monitored during session;Repositioned    Home Living Family/patient expects to be discharged to:: Private residence Living Arrangements: Alone Available Help at Discharge: Family;Available PRN/intermittently;Personal care attendant Type of Home: House Home Access: Stairs to enter Entrance Stairs-Rails: None Entrance Stairs-Number of Steps: 3   Home Layout: One  level Home Equipment: Conservation officer, nature (2 wheels);Cane - single point;Wheelchair - manual Additional Comments: some information obtained from previous chart review    Prior Function  Prior Level of Function : Needs assist       Physical Assist : ADLs (physical)   ADLs (physical): Bathing Mobility Comments: pt ambulates with RW       Hand Dominance   Dominant Hand: Right    Extremity/Trunk Assessment   Upper Extremity Assessment Upper Extremity Assessment: Generalized weakness    Lower Extremity Assessment Lower Extremity Assessment: Generalized weakness    Cervical / Trunk Assessment Cervical / Trunk Assessment: Kyphotic  Communication   Communication: No difficulties  Cognition Arousal/Alertness: Awake/alert Behavior During Therapy: Flat affect Overall Cognitive Status: No family/caregiver present to determine baseline cognitive functioning Area of Impairment: Orientation;Following commands;Safety/judgement;Awareness;Problem solving                 Orientation Level: Disoriented to;Place;Time   Memory: Decreased short-term memory Following Commands: Follows one step commands with increased time Safety/Judgement: Decreased awareness of safety Awareness: Intellectual Problem Solving: Requires verbal cues;Requires tactile cues;Difficulty sequencing General Comments: Delay in answering questions        General Comments General comments (skin integrity, edema, etc.): VSS on RA    Exercises     Assessment/Plan    PT Assessment Patient needs continued PT services  PT Problem List Decreased strength;Decreased balance;Decreased activity tolerance;Decreased mobility;Decreased cognition;Decreased knowledge of use of DME;Decreased safety awareness;Decreased knowledge of precautions       PT Treatment Interventions DME instruction;Gait training;Functional mobility training;Stair training;Therapeutic activities;Therapeutic exercise;Balance training;Neuromuscular re-education;Patient/family education    PT Goals (Current goals can be found in the Care Plan section)  Acute Rehab PT Goals Patient Stated Goal: to feel better PT Goal  Formulation: With patient Time For Goal Achievement: 01/27/21 Potential to Achieve Goals: Fair    Frequency Min 2X/week   Barriers to discharge        Co-evaluation PT/OT/SLP Co-Evaluation/Treatment: Yes Reason for Co-Treatment: For patient/therapist safety;To address functional/ADL transfers PT goals addressed during session: Mobility/safety with mobility;Balance;Proper use of DME;Strengthening/ROM OT goals addressed during session: Strengthening/ROM;ADL's and self-care       AM-PAC PT "6 Clicks" Mobility  Outcome Measure Help needed turning from your back to your side while in a flat bed without using bedrails?: A Little Help needed moving from lying on your back to sitting on the side of a flat bed without using bedrails?: A Little Help needed moving to and from a bed to a chair (including a wheelchair)?: Total Help needed standing up from a chair using your arms (e.g., wheelchair or bedside chair)?: Total Help needed to walk in hospital room?: Total Help needed climbing 3-5 steps with a railing? : Total 6 Click Score: 10    End of Session   Activity Tolerance: Patient limited by fatigue Patient left: in bed;with call bell/phone within reach Nurse Communication: Mobility status PT Visit Diagnosis: Other abnormalities of gait and mobility (R26.89);Muscle weakness (generalized) (M62.81)    Time: 3568-6168 PT Time Calculation (min) (ACUTE ONLY): 27 min   Charges:   PT Evaluation $PT Eval Low Complexity: [place], PT, DPT Acute Rehabilitation Pager: [phone] Office [phone]   Zenaida Niece 01/13/2021, 10:50 AM

## 2021-01-13 NOTE — Evaluation (Signed)
Occupational Therapy Evaluation Patient Details Name: Danielle Bryan MRN: [id number] DOB: 1923-09-23 Today's Date: 01/13/2021   History of Present Illness KEILYNN MARANO is a 87 y.o. female presenting to the ED via EMS for evaluation of nose injury/bleed after a mechanical fall.  Family reported poor appetite and multiple recent falls due to generalized weakness. Patient lives alone.CT maxillofacial showing possible nondisplaced fractures of the nasal bones; all other imaging negative.  PHMx: hypertension, hyperlipidemia, hypothyroidism, osteoarthritis, stroke/TIA on Plavix, seizure disorder, GERD, depression.   Clinical Impression   This 87 yo female admitted with above presents to acute OT with PLOF of living alone (with reports she does get A from aide and son--unsure of accuracy since she took increased time to answer all questions and was not oriented to place or time). Currently she is setup/S-total A for basic ADLs, and min A-min A +2 for limited mobility. She will continue to benefit from acute OT with follow up at SNF.      Recommendations for follow up therapy are one component of a multi-disciplinary discharge planning process, led by the attending physician.  Recommendations may be updated based on patient status, additional functional criteria and insurance authorization.   Follow Up Recommendations  Skilled nursing-short term rehab (<3 hours/day)    Assistance Recommended at Discharge Frequent or constant Supervision/Assistance  Functional Status Assessment  Patient has had a recent decline in their functional status and demonstrates the ability to make significant improvements in function in a reasonable and predictable amount of time.  Equipment Recommendations  None recommended by OT       Precautions / Restrictions Precautions Precautions: Fall Restrictions Weight Bearing Restrictions: No      Mobility Bed Mobility Overal bed mobility: Needs Assistance Bed Mobility:  Supine to Sit;Sit to Supine     Supine to sit: Min assist Sit to supine: Min assist        Transfers Overall transfer level: Needs assistance Equipment used: Rolling walker (2 wheels) Transfers: Sit to/from Stand Sit to Stand: Min assist;+2 physical assistance           General transfer comment: Pt unable to take steps due to reporting she needed to sit down due to felt like she was going to fall      Balance Overall balance assessment: Needs assistance Sitting-balance support: Bilateral upper extremity supported;Feet supported Sitting balance-Leahy Scale: Poor     Standing balance support: Bilateral upper extremity supported Standing balance-Leahy Scale: Poor Standing balance comment: brief period of standing                           ADL either performed or assessed with clinical judgement   ADL Overall ADL's : Needs assistance/impaired Eating/Feeding: Set up;Sitting Eating/Feeding Details (indicate cue type and reason): side of stretcher for drinking Grooming: Wash/dry face;Set up;Bed level   Upper Body Bathing: Moderate assistance Upper Body Bathing Details (indicate cue type and reason): sitting side of stretcher Lower Body Bathing: Total assistance Lower Body Bathing Details (indicate cue type and reason): Min A +2 sit<>stand Upper Body Dressing : Maximal assistance Upper Body Dressing Details (indicate cue type and reason): sitting side of stretcher Lower Body Dressing: Total assistance Lower Body Dressing Details (indicate cue type and reason): Min A +2 sit<>stand                     Vision Baseline Vision/History: 1 Wears glasses Ability to See in Adequate Light:  0 Adequate Patient Visual Report: No change from baseline              Pertinent Vitals/Pain Pain Assessment: Faces Faces Pain Scale: Hurts even more Pain Location: legs Pain Descriptors / Indicators: Grimacing Pain Intervention(s): Monitored during  session;Repositioned     Hand Dominance Right   Extremity/Trunk Assessment Upper Extremity Assessment Upper Extremity Assessment: Generalized weakness   Lower Extremity Assessment Lower Extremity Assessment: Generalized weakness   Cervical / Trunk Assessment Cervical / Trunk Assessment: Kyphotic   Communication Communication Communication: No difficulties   Cognition Arousal/Alertness: Awake/alert Behavior During Therapy: Flat affect Overall Cognitive Status: No family/caregiver present to determine baseline cognitive functioning Area of Impairment: Orientation;Following commands;Safety/judgement;Awareness;Problem solving                 Orientation Level: Disoriented to;Place;Time   Memory: Decreased short-term memory Following Commands: Follows one step commands with increased time Safety/Judgement: Decreased awareness of safety Awareness: Intellectual Problem Solving: Requires verbal cues;Requires tactile cues;Difficulty sequencing General Comments: Delay in answering questions     General Comments  VSS on RA            Home Living Family/patient expects to be discharged to:: Private residence Living Arrangements: Alone Available Help at Discharge: Family;Available PRN/intermittently;Personal care attendant Type of Home: House Home Access: Stairs to enter [employer] of Steps: 3 Entrance Stairs-Rails: None Home Layout: One level     Bathroom Shower/Tub: Teacher, early years/pre: Standard     Home Equipment: Conservation officer, nature (2 wheels);Cane - single point;Wheelchair - manual   Additional Comments: some information obtained from previous chart review      Prior Functioning/Environment Prior Level of Function : Needs assist       Physical Assist : ADLs (physical)   ADLs (physical): Bathing Mobility Comments: pt ambulates with RW          OT Problem List: Decreased strength;Decreased activity tolerance;Impaired balance  (sitting and/or standing);Pain      OT Treatment/Interventions: Self-care/ADL training;DME and/or AE instruction;Patient/family education;Balance training    OT Goals(Current goals can be found in the care plan section) Acute Rehab OT Goals Patient Stated Goal: did not state OT Goal Formulation: With patient Time For Goal Achievement: 01/27/21 Potential to Achieve Goals: Good  OT Frequency: Min 2X/week   Barriers to D/C: Decreased caregiver support          Co-evaluation PT/OT/SLP Co-Evaluation/Treatment: Yes Reason for Co-Treatment: For patient/therapist safety;To address functional/ADL transfers PT goals addressed during session: Mobility/safety with mobility;Balance;Proper use of DME;Strengthening/ROM OT goals addressed during session: Strengthening/ROM;ADL's and self-care      AM-PAC OT "6 Clicks" Daily Activity     Outcome Measure Help from another person eating meals?: A Little Help from another person taking care of personal grooming?: A Little Help from another person toileting, which includes using toliet, bedpan, or urinal?: A Lot Help from another person bathing (including washing, rinsing, drying)?: A Lot Help from another person to put on and taking off regular upper body clothing?: A Lot Help from another person to put on and taking off regular lower body clothing?: Total 6 Click Score: 13   End of Session Equipment Utilized During Treatment: Gait belt;Rolling walker (2 wheels)  Activity Tolerance: Patient limited by fatigue Patient left: in bed;with call bell/phone within reach  OT Visit Diagnosis: Unsteadiness on feet (R26.81);Muscle weakness (generalized) (M62.81);Pain Pain - Right/Left:  (both) Pain - part of body: Leg  Time: 5427-0623 OT Time Calculation (min): 28 min Charges:  OT General Charges $OT Visit: 1 Visit OT Evaluation $OT Eval Moderate Complexity: 1 Mod  [place] [address], OTR/L Acute [employer] Pager [phone] Office  [phone]    Almon Register 01/13/2021, 10:52 AM

## 2021-01-13 NOTE — Consult Note (Addendum)
Consultation Note Date: 01/13/2021   Patient Name: Danielle Bryan  DOB: [birth year]  MRN: [id number]  Age / Sex: 87 y.o., female  PCP: Velna Hatchet, MD Referring Physician: Florencia Reasons, MD  Reason for Consultation: Establishing goals of care  HPI/Patient Profile: 87 y.o. female  with past medical history of  hypertension, hyperlipidemia, hypothyroidism, osteoarthritis, stroke/TIA on Plavix, seizure disorder, GERD, depression presented to ED on 01/12/21 from home after sustaining a fall. Patient has had progressive weakness x1 with increase in falls. Patient was admitted on 01/12/2021 with acute metabolic encephalopathy, frequent falls, dehydration, nondisplaced fracture of nasal bones.   Clinical Assessment and Goals of Care: I have reviewed medical records including EPIC notes, labs, and imaging.   Went to visit patient at bedside - no family/visitors present. Patient was lying in bed awake, alert, oriented to self only, and able to participate in minimal conversation. No signs or non-verbal gestures of pain or discomfort noted. No respiratory distress, increased work of breathing, or secretions noted. Patient denies pain.  Met with son/[name]  to discuss diagnosis, prognosis, GOC, EOL wishes, disposition, and options.  I introduced Palliative Medicine as specialized medical care for people living with serious illness. It focuses on providing relief from the symptoms and stress of a serious illness. The goal is to improve quality of life for both the patient and the family.  We discussed a brief life review of the patient as well as functional and nutritional status. Patient's husband has passed - she has two sons. Prior to hospitalization, she was living in a private residence alone. She had home health aids come to her home every morning and evening to assist her with cooking and light house hold chores. Patient  was able to dress herself. She has ambulated with a walker since her stroke 2 years ago. [name] tells me the patient's appetite has been poor and she "doesn't eat much." He noticed a decline her in oral intake since the beginning of this year. He notes her she has become physically weaker over the last 5 weeks, telling me she's had 3 falls within that time. He also has noticed her communication has decreased since last week.  We discussed patient's current illness and what it means in the larger context of patient's on-going co-morbidities. [name] has a clear understanding that at this time, there are no medical indications for patient's physical decline/decreased oral intake. We reviewed natural trajectory at EOL. I attempted to elicit values and goals of care important to the patient. The difference between aggressive medical intervention and comfort care was considered in light of the patient's goals of care. [name] would like to continue to treat the treatable while patient is in house.   Hospice and Palliative Care services outpatient were explained and offered. We discussed discharge options to include: rehab then transition to LTC with possible outpatient Palliative Care follow up vs discharge straight to LTC with/without hospice or outpatient PC. [name] does know he he would like patient placed in LTC, he's deciding on  if he feels patient would benefit from rehab. Discussed that the goal of rehab is improvement of functional status,  which can be a difficult goal to meet for patients with advanced illness and multiple medical conditions. Reviewed what is needed for someone to have a positive rehabilitation experience to include adequate nutritional intake as well as willingness/ability to participate. [name] would like to speak with TOC as he has financial/insurance questions around LTC/rehab.  Advance directives, concepts specific to code status, and rehospitalization were considered and discussed. Patient  does not have a Living Will or HCPOA; however, [name] tells me he is the primary decision maker for the patient - he does speak with his brother before making any big decisions. [name] understands patient is at high risk of rehospitalization after discharge - he is not sure yet if the goal would be rehospitalization vs transition to comfort/hospice care in the event of a decline - he would like to speak with his brother.   Encouraged patient/family to consider DNR/DNI status understanding evidenced based poor outcomes in similar hospitalized patient, as the cause of arrest is likely associated with advanced chronic/terminal illness rather than an easily reversible acute cardio-pulmonary event. I explained that DNR/DNI does not change the medical plan and it only comes into effect after a person has arrested (died).  It is a protective measure to keep [place] from harming the patient in their last moments of life. [name] was agreeable to DNR/DNI with understanding that patient would not receive CPR, defibrillation, ACLS medications, or intubation.   Discussed with patient/family the importance of continued conversation with each other and the medical providers regarding overall plan of care and treatment options, ensuring decisions are within the context of the patient's values and GOCs.    Questions and concerns were addressed. The patient/family was encouraged to call with questions and/or concerns. PMT number was provided.   Primary Decision Maker: NEXT OF KIN - son/[name]    SUMMARY OF RECOMMENDATIONS   Continue current medical treatment Now DNR/DNI - durable DNR form completed and placed in shadow chart. Copy was made and will be scanned into Vynca/ACP tab Son is not sure if patient will find much benefit from rehab. He is deciding on discharge straight to LTC vs discharge to rehab then LTC. He is also considering discharge with either outpatient Palliative Care vs home hospice [facility] consulted for:  son's request for assistance with LTC placement and he has insurance/financial questions PMT will continue to follow and support holistically   Code Status/Advance Care Planning: DNR   Palliative Prophylaxis:  Aspiration, Bowel Regimen, Delirium Protocol, Frequent Pain Assessment, Oral Care, and Turn Reposition  Additional Recommendations (Limitations, Scope, Preferences): Full Scope Treatment and No Tracheostomy  Psycho-social/Spiritual:  Desire for further Chaplaincy support:no Created space and opportunity for patient and family to express thoughts and feelings regarding patient's current medical situation.  Emotional support and therapeutic listening provided.  Prognosis:  Hard to determine, but <6 months would not be surprising due to poor oral intake and if family decide they do not want patient rehospitalized    Discharge Planning: To Be Determined      Primary Diagnoses: Present on Admission:  Fall  Hypothyroidism  Dehydration   I have reviewed the medical record, interviewed the patient and family, and examined the patient. The following aspects are pertinent.  Past Medical History:  Diagnosis Date   Hypercholesterolemia    Hypertension    Hypothyroidism    Osteoarthritis    Reactive depression (  situational)    Stroke [facility])    TIA (transient ischemic attack)    on plavix   Social History   Socioeconomic History   Marital status: Widowed    Spouse name: Not on file   Number of children: 2   Years of education: 40   Highest education level: Not on file  Occupational History   Not on file  Tobacco Use   Smoking status: Never   Smokeless tobacco: Never  Substance and Sexual Activity   Alcohol use: No    Alcohol/week: 0.0 standard drinks   Drug use: No   Sexual activity: Not on file  Other Topics Concern   Not on file  Social History Narrative   Lives at home alone.    Has 2 children.   Caffeine: occasional    Social Determinants of Adult nurse Strain: Not on file  Food Insecurity: No Food Insecurity   Worried About Charity fundraiser in the Last Year: Never true   Ran Out of Food in the Last Year: Never true  Transportation Needs: No Transportation Needs   Lack of Transportation (Medical): No   Lack of Transportation (Non-Medical): No  Physical Activity: Not on file  Stress: Not on file  Social Connections: Not on file   Family History  Problem Relation Age of Onset   Heart failure Mother    Pneumonia Mother    Heart failure Father    Scheduled Meds:  clopidogrel  75 mg Oral Daily   divalproex  500 mg Oral q morning   [START ON 01/14/2021] levothyroxine  100 mcg Oral Once per day on Mon Tue Wed Thu Fri Sat   pantoprazole  40 mg Oral QPM   sertraline  50 mg Oral Daily   Continuous Infusions:  sodium chloride 75 mL/hr at 01/13/21 1622   PRN Meds:.acetaminophen **OR** acetaminophen, labetalol Medications Prior to Admission:  Prior to Admission medications   Medication Sig Start Date End Date Taking? Authorizing Provider  acetaminophen (TYLENOL) 500 MG tablet Take 1,000 mg by mouth See admin instructions. Take 2 tablets (1000 mg) by mouth every morning, may also take 2 tablets (1000 mg) in the evening as needed for headache/pain   Yes [provider]  amLODipine (NORVASC) 5 MG tablet Take 5 mg by mouth every morning. 03/18/16  Yes [provider]  Ascorbic Acid (VITAMIN C PO) Take 1 tablet by mouth every evening.   Yes [provider]  CALCIUM CARB-CHOLECALCIFEROL PO Take 1 tablet by mouth every evening.   Yes [provider]  Cholecalciferol (VITAMIN D3 PO) Take 1 tablet by mouth every evening.   Yes [provider]  clopidogrel (PLAVIX) 75 MG tablet Take 1 tablet (75 mg total) by mouth daily. Patient taking differently: Take 75 mg by mouth every morning. 03/21/14  Yes Velna Hatchet, MD  divalproex (DEPAKOTE ER) 500 MG 24 hr tablet TAKE 1 TABLET BY  MOUTH EVERY DAY Patient taking differently: 500 mg every morning. 04/19/20  Yes McCue, Janett Billow, NP  levothyroxine (SYNTHROID, LEVOTHROID) 100 MCG tablet TAKE 1 TABLET (100 MCG TOTAL) BY MOUTH DAILY. TAKING 6 DAYS A WEEK. DON'T TAKE ON SUNDAYS Patient taking differently: Take 50 mcg by mouth See admin instructions. Take one tablet (50 mcg) by mouth 6 days weekly in the morning, skip Sundays   Yes Darlin Coco, MD  pantoprazole (PROTONIX) 40 MG tablet Take 40 mg by mouth every evening.   Yes [provider]  sertraline (  ZOLOFT) 50 MG tablet Take 1 tablet (50 mg total) by mouth daily. 06/28/20  Yes McCue, Janett Billow, NP  triamcinolone cream (KENALOG) 0.5 % Apply 1 application topically daily as needed (rash/irritation).   Yes [provider]  Trolamine Salicylate (ASPERCREME EX) Apply 1 application topically daily as needed (knee pain).   Yes [provider]  Pitavastatin Calcium (LIVALO) 2 MG TABS Take 2 tablets (4 mg total) by mouth once a week. Patient not taking: Reported on 01/12/2021 01/30/19   Raiford Noble Latif, DO   Allergies  Allergen Reactions   Escitalopram Oxalate Nausea Only   Lisinopril Other (See Comments)    Very decreased blood pressure    Statins Other (See Comments)    myalgias   Sulfa Antibiotics Hives   Review of Systems  Unable to perform ROS: Mental status change   Physical Exam Vitals and nursing note reviewed.  Constitutional:      General: She is not in acute distress.    Appearance: She is ill-appearing.  Pulmonary:     Effort: No respiratory distress.  Skin:    General: Skin is warm and dry.  Neurological:     Mental Status: She is alert. She is disoriented and confused.     Motor: Weakness present.  Psychiatric:        Speech: Speech is slurred.        Behavior: Behavior is slowed. Behavior is cooperative.        Cognition and Memory: Cognition is impaired. Memory is impaired.    Vital Signs: BP (!) 156/111 (BP Location:  Left Arm)   Pulse (!) 102   Temp 97.6 F (36.4 C) (Oral)   Resp 16   Ht $R'5\' 1"'VI$  (1.549 m)   Wt 53.3 kg   SpO2 95%   BMI 22.20 kg/m  Pain Scale: 0-10   Pain Score: Asleep   SpO2: SpO2: 95 % O2 Device:SpO2: 95 % O2 Flow Rate: .   IO: Intake/output summary:  Intake/Output Summary (Last 24 hours) at 01/13/2021 1657 Last data filed at 01/13/2021 1445 Gross per 24 hour  Intake 1000 ml  Output 600 ml  Net 400 ml    LBM:   Baseline Weight: Weight: 53.3 kg Most recent weight: Weight: 53.3 kg     Palliative Assessment/Data: PPS 30%     Time In: 1700 Time Out: 1810 Time Total: 70 minutes  Greater than 50%  of this time was spent counseling and coordinating care related to the above assessment and plan.  Signed by: Lin Landsman, NP   Please contact Palliative Medicine Team phone at [phone] for questions and concerns.  For individual provider: See Shea Evans

## 2021-01-13 NOTE — Progress Notes (Signed)
VASCULAR LAB    Carotid duplex has been performed.  See CV proc for preliminary results.   Ranjit Ashurst, RVT 01/13/2021, 9:47 AM

## 2021-01-14 DIAGNOSIS — Z79890 Hormone replacement therapy (postmenopausal): Secondary | ICD-10-CM | POA: Diagnosis not present

## 2021-01-14 DIAGNOSIS — R627 Adult failure to thrive: Secondary | ICD-10-CM | POA: Diagnosis present

## 2021-01-14 DIAGNOSIS — R296 Repeated falls: Secondary | ICD-10-CM | POA: Diagnosis present

## 2021-01-14 DIAGNOSIS — R531 Weakness: Secondary | ICD-10-CM | POA: Diagnosis not present

## 2021-01-14 DIAGNOSIS — Z789 Other specified health status: Secondary | ICD-10-CM | POA: Diagnosis not present

## 2021-01-14 DIAGNOSIS — M8580 Other specified disorders of bone density and structure, unspecified site: Secondary | ICD-10-CM | POA: Diagnosis present

## 2021-01-14 DIAGNOSIS — I10 Essential (primary) hypertension: Secondary | ICD-10-CM | POA: Diagnosis present

## 2021-01-14 DIAGNOSIS — Z711 Person with feared health complaint in whom no diagnosis is made: Secondary | ICD-10-CM | POA: Diagnosis not present

## 2021-01-14 DIAGNOSIS — E039 Hypothyroidism, unspecified: Secondary | ICD-10-CM | POA: Diagnosis present

## 2021-01-14 DIAGNOSIS — D72829 Elevated white blood cell count, unspecified: Secondary | ICD-10-CM | POA: Diagnosis present

## 2021-01-14 DIAGNOSIS — Z515 Encounter for palliative care: Secondary | ICD-10-CM | POA: Diagnosis not present

## 2021-01-14 DIAGNOSIS — R1312 Dysphagia, oropharyngeal phase: Secondary | ICD-10-CM | POA: Diagnosis not present

## 2021-01-14 DIAGNOSIS — S022XXA Fracture of nasal bones, initial encounter for closed fracture: Secondary | ICD-10-CM | POA: Diagnosis present

## 2021-01-14 DIAGNOSIS — M6281 Muscle weakness (generalized): Secondary | ICD-10-CM | POA: Diagnosis not present

## 2021-01-14 DIAGNOSIS — E860 Dehydration: Secondary | ICD-10-CM | POA: Diagnosis present

## 2021-01-14 DIAGNOSIS — W19XXXA Unspecified fall, initial encounter: Secondary | ICD-10-CM | POA: Diagnosis not present

## 2021-01-14 DIAGNOSIS — Z20822 Contact with and (suspected) exposure to covid-19: Secondary | ICD-10-CM | POA: Diagnosis present

## 2021-01-14 DIAGNOSIS — W010XXA Fall on same level from slipping, tripping and stumbling without subsequent striking against object, initial encounter: Secondary | ICD-10-CM | POA: Diagnosis present

## 2021-01-14 DIAGNOSIS — L89621 Pressure ulcer of left heel, stage 1: Secondary | ICD-10-CM | POA: Diagnosis present

## 2021-01-14 DIAGNOSIS — S022XXD Fracture of nasal bones, subsequent encounter for fracture with routine healing: Secondary | ICD-10-CM | POA: Diagnosis not present

## 2021-01-14 DIAGNOSIS — Z66 Do not resuscitate: Secondary | ICD-10-CM | POA: Diagnosis present

## 2021-01-14 DIAGNOSIS — I69828 Other speech and language deficits following other cerebrovascular disease: Secondary | ICD-10-CM | POA: Diagnosis not present

## 2021-01-14 DIAGNOSIS — G40909 Epilepsy, unspecified, not intractable, without status epilepticus: Secondary | ICD-10-CM | POA: Diagnosis present

## 2021-01-14 DIAGNOSIS — D696 Thrombocytopenia, unspecified: Secondary | ICD-10-CM | POA: Diagnosis present

## 2021-01-14 DIAGNOSIS — F32A Depression, unspecified: Secondary | ICD-10-CM | POA: Diagnosis present

## 2021-01-14 DIAGNOSIS — Z8673 Personal history of transient ischemic attack (TIA), and cerebral infarction without residual deficits: Secondary | ICD-10-CM | POA: Diagnosis not present

## 2021-01-14 DIAGNOSIS — Z8249 Family history of ischemic heart disease and other diseases of the circulatory system: Secondary | ICD-10-CM | POA: Diagnosis not present

## 2021-01-14 DIAGNOSIS — Z9181 History of falling: Secondary | ICD-10-CM | POA: Diagnosis not present

## 2021-01-14 DIAGNOSIS — L89611 Pressure ulcer of right heel, stage 1: Secondary | ICD-10-CM | POA: Diagnosis present

## 2021-01-14 DIAGNOSIS — K219 Gastro-esophageal reflux disease without esophagitis: Secondary | ICD-10-CM | POA: Diagnosis present

## 2021-01-14 DIAGNOSIS — R41841 Cognitive communication deficit: Secondary | ICD-10-CM | POA: Diagnosis not present

## 2021-01-14 DIAGNOSIS — R2689 Other abnormalities of gait and mobility: Secondary | ICD-10-CM | POA: Diagnosis not present

## 2021-01-14 DIAGNOSIS — Z7401 Bed confinement status: Secondary | ICD-10-CM | POA: Diagnosis not present

## 2021-01-14 DIAGNOSIS — I69891 Dysphagia following other cerebrovascular disease: Secondary | ICD-10-CM | POA: Diagnosis not present

## 2021-01-14 DIAGNOSIS — G9341 Metabolic encephalopathy: Secondary | ICD-10-CM | POA: Diagnosis present

## 2021-01-14 DIAGNOSIS — Z7902 Long term (current) use of antithrombotics/antiplatelets: Secondary | ICD-10-CM | POA: Diagnosis not present

## 2021-01-14 DIAGNOSIS — Z7189 Other specified counseling: Secondary | ICD-10-CM | POA: Diagnosis not present

## 2021-01-14 LAB — BASIC METABOLIC PANEL
Anion gap: 8 (ref 5–15)
BUN: 9 mg/dL (ref 8–23)
CO2: 26 mmol/L (ref 22–32)
Calcium: 8.3 mg/dL — ABNORMAL LOW (ref 8.9–10.3)
Chloride: 98 mmol/L (ref 98–111)
Creatinine, Ser: 0.49 mg/dL (ref 0.44–1.00)
GFR, Estimated: >60 mL/min (ref >60)
Glucose, Bld: 107 mg/dL — ABNORMAL HIGH (ref 70–99)
Potassium: 3.5 mmol/L (ref 3.5–5.1)
Sodium: 132 mmol/L — ABNORMAL LOW (ref 135–145)

## 2021-01-14 LAB — URINE CULTURE: Culture: NO GROWTH

## 2021-01-14 LAB — CBC WITH DIFFERENTIAL/PLATELET
Abs Immature Granulocytes: 0.05 10*3/uL (ref 0.00–0.07)
Basophils Absolute: 0 10*3/uL (ref 0.0–0.1)
Basophils Relative: 0 %
Eosinophils Absolute: 0.1 10*3/uL (ref 0.0–0.5)
Eosinophils Relative: 1 %
HCT: 33.6 % — ABNORMAL LOW (ref 36.0–46.0)
Hemoglobin: 11 g/dL — ABNORMAL LOW (ref 12.0–15.0)
Immature Granulocytes: 1 %
Lymphocytes Relative: 13 %
Lymphs Abs: 1 10*3/uL (ref 0.7–4.0)
MCH: 29.8 pg (ref 26.0–34.0)
MCHC: 32.7 g/dL (ref 30.0–36.0)
MCV: 91.1 fL (ref 80.0–100.0)
Monocytes Absolute: 0.7 10*3/uL (ref 0.1–1.0)
Monocytes Relative: 8 %
Neutro Abs: 6 10*3/uL (ref 1.7–7.7)
Neutrophils Relative %: 77 %
Platelets: 128 10*3/uL — ABNORMAL LOW (ref 150–400)
RBC: 3.69 MIL/uL — ABNORMAL LOW (ref 3.87–5.11)
RDW: 12.9 % (ref 11.5–15.5)
WBC: 7.7 10*3/uL (ref 4.0–10.5)
nRBC: 0 % (ref 0.0–0.2)

## 2021-01-14 LAB — MAGNESIUM: Magnesium: 1.8 mg/dL (ref 1.7–2.4)

## 2021-01-14 NOTE — TOC CAGE-AID Note (Signed)
Transition of Care [facility]) - CAGE-AID Screening   Patient Details  Name: Danielle Bryan MRN: [id number] Date of Birth: Jun 24, 1923  Transition of Care [facility]) CM/SW Contact:    [name] C Tarpley-Carter, [place] Phone Number: 01/14/2021, 9:27 AM   Clinical Narrative: Pt is unable to participate in Cage Aid.   Jaun Galluzzo Tarpley-Carter, MSW, LCSW-A Pronouns:  She/Her/Hers [facility]itions of Care Clinical Social Worker Direct Number:  [phone] [email or web address]   CAGE-AID Screening: Substance Abuse Screening unable to be completed due to: : Patient unable to participate             Substance Abuse Education Offered: No

## 2021-01-14 NOTE — Significant Event (Signed)
Patient is finally waking up and alert enough to eat. Able to feed her a few bite but she is pocketing food.

## 2021-01-14 NOTE — TOC Initial Note (Addendum)
Transition of Care [facility]) - Initial/Assessment Note    Patient Details  Name: Danielle Bryan MRN: [id number] Date of Birth: [birth year]  Transition of Care [facility]) CM/SW Contact:    Tresa Endo Phone Number: 01/14/2021, 1:05 PM  Clinical Narrative:                 CSW received SNF consult. CSW met with pt pt nephew and wife at bedside. Family explained that they were just visitng and to contact the pt son who is POA. CSW contacted pt son and introduced self and explained role at the hospital. Pt son reports that PTA the lived home alone with aides. He also shared that pt no longer has aides bc the company [facility]) stopped services bc of staffing issues. PT reports pt ambulates with a two wheeled walker and needs assistance with ADLs. PT reports a click score of 10 and minA/minA+2.  CSW reviewed PT/OT recommendations for SNF. Pt son reports pt needing long term care bc of mentation but would like SNF to get pt back to baseline. Pt gave CSW permission to fax out to facilities in the area. Pt has no preference of facility at this time. CSW gave pt medicare.gov rating list to review.   Pt has 2 offers so far [facility] and [employer]) CSW shared these options with pt son who will review medicare.gov list when he visits this evening.  CSW will continue to follow.    Expected Discharge Plan: Skilled Nursing Facility Barriers to Discharge: Continued Medical Work up   Patient Goals and CMS Choice Patient states their goals for this hospitalization and ongoing recovery are:: Rehab CMS Medicare.gov Compare Post Acute Care list provided to:: Patient Choice offered to / list presented to : Patient  Expected Discharge Plan and Services Expected Discharge Plan: [place] In-house Referral: Clinical Social Work   Post Acute Care Choice: [place] Living arrangements for the past 2 months: [place]                                      Prior  Living Arrangements/Services Living arrangements for the past 2 months: Single Family Home Lives with:: Self Patient language and need for interpreter reviewed:: Yes Do you feel safe going back to the place where you live?: Yes      Need for Family Participation in Patient Care: Yes (Comment) Care giver support system in place?: Yes (comment)   Criminal Activity/Legal Involvement Pertinent to Current Situation/Hospitalization: No - Comment as needed  Activities of Daily Living Home Assistive Devices/Equipment: Walker (specify type) ADL Screening (condition at time of admission) Patient's cognitive ability adequate to safely complete daily activities?: No Is the patient deaf or have difficulty hearing?: Yes Does the patient have difficulty seeing, even when wearing glasses/contacts?: No Does the patient have difficulty concentrating, remembering, or making decisions?: Yes Patient able to express need for assistance with ADLs?: Yes Does the patient have difficulty dressing or bathing?: Yes Independently performs ADLs?: No Communication: Needs assistance Dressing (OT): Needs assistance Grooming: Needs assistance Feeding: Needs assistance Bathing: Needs assistance Toileting: Needs assistance In/Out Bed: Needs assistance Walks in Home: Needs assistance Is this a change from baseline?: Pre-admission baseline Does the patient have difficulty walking or climbing stairs?: Yes Weakness of Legs: Both Weakness of Arms/Hands: None  Permission Sought/Granted Permission sought to share information with : Customer service manager,  Family Supports Permission granted to share information with : Yes, Verbal Permission Granted  Share Information with NAME: Nieve, Rojero)   [phone]  Permission granted to share info w AGENCY: SNF  Permission granted to share info w Relationship: Velera, [place] (Son)   [phone]  Permission granted to share info w Contact Information: Falon, [place])    [phone]  Emotional Assessment Appearance:: Appears stated age Attitude/Demeanor/Rapport: Gracious Affect (typically observed): Appropriate Orientation: : Oriented to Self Alcohol / Substance Use: Not Applicable Psych Involvement: No (comment)  Admission diagnosis:  Leukocytosis [D72.829] Fall [W19.XXXA] Encephalopathy [G93.40] Patient Active Problem List   Diagnosis Date Noted   Pressure injury of skin 01/13/2021   Fall 01/12/2021   Nasal bone fractures 01/12/2021   Pain due to onychomycosis of toenails of both feet 05/04/2019   Coagulation defect ([place]) 05/04/2019   Vertigo 01/27/2019   CVA (cerebral vascular accident) ([place]) 01/27/2019   Aphasia 03/20/2016   HTN (hypertension) 03/20/2016   Weakness 03/18/2015   Acute kidney injury ([place]) 03/18/2015   Dehydration 03/18/2015   Nausea and vomiting 03/18/2015   Thrombocytopenia ([place]) 03/18/2015   Moderate nausea and vomiting 03/18/2015   Elevated lactic acid level 03/18/2015   Elevated troponin 06/00/4599   Acute metabolic encephalopathy 77/41/4239   UTI (urinary tract infection) 08/22/2014   TIA (transient ischemic attack) 03/19/2014   Benign hypertensive heart disease without heart failure 08/20/2010   Hypothyroidism    Osteoarthritis    Hypercholesterolemia    Reactive depression (situational)    PCP:  Velna Hatchet, MD Pharmacy:   [id number] [place], [place] [address] [place] [address] Phone: [phone] Fax: [phone]  [facility] [place], [place] [address] Phone: [phone] Fax: [phone]  [facility] [place], [place] [address] Phone: [phone] Fax: [phone]     Social Determinants of Health (SDOH) Interventions    Readmission Risk  Interventions No flowsheet data found.

## 2021-01-14 NOTE — NC FL2 (Signed)
[place] LEVEL OF CARE SCREENING TOOL     IDENTIFICATION  Patient Name: Danielle Bryan Birthdate: 1923/09/09 Sex: female Admission Date (Current Location): 01/12/2021  [facility] and [place] Number:  Herbalist and Address:  [facility] [place], [place] [address], [place], [place] [address]      Provider Number: [id number]  Attending Physician Name and Address:  Damita Lack, MD  Relative Name and Phone Number:  Sianne, [place])   [phone]    Current Level of Care: Hospital Recommended Level of Care: [place] Prior Approval Number:    Date Approved/Denied:   PASRR Number: [id number]  Discharge Plan: SNF    Current Diagnoses: Patient Active Problem List   Diagnosis Date Noted   Pressure injury of skin 01/13/2021   Fall 01/12/2021   Nasal bone fractures 01/12/2021   Pain due to onychomycosis of toenails of both feet 05/04/2019   Coagulation defect ([place]) 05/04/2019   Vertigo 01/27/2019   CVA (cerebral vascular accident) ([place]) 01/27/2019   Aphasia 03/20/2016   HTN (hypertension) 03/20/2016   Weakness 03/18/2015   Acute kidney injury ([place]) 03/18/2015   Dehydration 03/18/2015   Nausea and vomiting 03/18/2015   Thrombocytopenia ([place]) 03/18/2015   Moderate nausea and vomiting 03/18/2015   Elevated lactic acid level 03/18/2015   Elevated troponin 40/97/3532   Acute metabolic encephalopathy 99/24/2683   UTI (urinary tract infection) 08/22/2014   TIA (transient ischemic attack) 03/19/2014   Benign hypertensive heart disease without heart failure 08/20/2010   Hypothyroidism    Osteoarthritis    Hypercholesterolemia    Reactive depression (situational)     Orientation RESPIRATION BLADDER Height & Weight     Self  Normal Incontinent, External catheter Weight: 118 lb 6.2 oz (53.7 kg) Height:  5\' 1"  (154.9 cm)  BEHAVIORAL SYMPTOMS/MOOD NEUROLOGICAL BOWEL NUTRITION STATUS      Continent Diet (See DC Summary)   AMBULATORY STATUS COMMUNICATION OF NEEDS Skin   Extensive Assist Verbally Skin abrasions (Back)                       Personal Care Assistance Level of Assistance  Bathing, Feeding, Dressing Bathing Assistance: Maximum assistance Feeding assistance: Limited assistance Dressing Assistance: Maximum assistance     Functional Limitations Info  Sight, Hearing, Speech Sight Info: Impaired Hearing Info: Impaired Speech Info: Adequate    SPECIAL CARE FACTORS FREQUENCY  PT (By licensed PT), OT (By licensed OT)     PT Frequency: 5x a week OT Frequency: 5x a week            Contractures Contractures Info: Not present    Additional Factors Info  Code Status, Allergies Code Status Info: DNR Allergies Info: Escitalopram Oxalate   Lisinopril   Statins   Sulfa Antibiotics           Current Medications (01/14/2021):  This is the current hospital active medication list Current Facility-Administered Medications  Medication Dose Route Frequency Provider Last Rate Last Admin   0.9 %  sodium chloride infusion   Intravenous Continuous Florencia Reasons, MD 75 mL/hr at 01/14/21 1112 New Bag at 01/14/21 1112   acetaminophen (TYLENOL) tablet 650 mg  650 mg Oral Q6H PRN Shela Leff, MD       Or   acetaminophen (TYLENOL) suppository 650 mg  650 mg Rectal Q6H PRN Shela Leff, MD       clopidogrel (PLAVIX) tablet 75 mg  75 mg Oral Daily Xu,  Annamaria Boots, MD   75 mg at 01/14/21 1138   divalproex (DEPAKOTE ER) 24 hr tablet 500 mg  500 mg Oral q morning Shela Leff, MD   500 mg at 01/13/21 1004   labetalol (NORMODYNE) injection 5 mg  5 mg Intravenous Q2H PRN Florencia Reasons, MD   5 mg at 01/14/21 4196   levothyroxine (SYNTHROID) tablet 100 mcg  100 mcg Oral Once per day on Mon Tue Wed Thu Fri Sat Shela Leff, MD   100 mcg at 01/14/21 0456   MEDLINE mouth rinse  15 mL Mouth Rinse BID Florencia Reasons, MD   15 mL at 01/13/21 2034   pantoprazole (PROTONIX) EC tablet 40 mg  40 mg Oral QPM Shela Leff, MD       sertraline (ZOLOFT) tablet 50 mg  50 mg Oral Daily Shela Leff, MD   50 mg at 01/14/21 1138     Discharge Medications: Please see discharge summary for a list of discharge medications.  Relevant Imaging Results:  Relevant Lab Results:   Additional Information SS#: [id number]  Reece Agar, LCSWA

## 2021-01-14 NOTE — Progress Notes (Signed)
Brief Palliative Medicine Progress Note:  Called patient's son/Danielle Bryan. Discussed discharge goals. Danielle Bryan still does not feel patient will do well at rehab; however, he would like to at least try. Reviewed and offered again outpatient Palliative Care services - he requests documents to read and will decide. I let him know I would leave documents at patient's bedside for his review.  Recommendations/Plan: Continue current medical treatment Continue DNR/DNI as previously documented Son is agreeable for patient discharge to rehab with goal of LTC placement after [facility] consult placed for: check on son's decision for outpatient Palliative Care referral. It is anticipated patient will not do well at rehab - they can transition her to hospice services without rehospitalization  PMT will continue to follow peripherally. If there are any imminent needs please call the service directly  Thank you for allowing PMT to assist in the care of this patient.  Danielle Bryan M. Tamala Julian, FNP-BC Palliative Medicine Team Team Phone: [phone] 15 minutes  Greater than 50%  of this time was spent counseling and coordinating care related to the above assessment and plan.

## 2021-01-14 NOTE — Progress Notes (Signed)
PROGRESS NOTE    Danielle Bryan  LGX:[id number] DOB: 12/04/1923 DOA: 01/12/2021 PCP: Velna Hatchet, MD   Brief Narrative:  87 y.o. female with medical history significant of hypertension, hyperlipidemia, hypothyroidism, osteoarthritis, stroke/TIA on Plavix, seizure disorder, GERD, depression presenting to the ED via EMS for evaluation of nose injury/bleed after a mechanical fall.  Family reported poor appetite and multiple recent falls due to generalized weakness, family also reported progressive confusion over the last year.  Unfortunately patient is suffering mainly from failure to thrive secondary to her advanced age.  Her inpatient work-up including echocardiogram, carotid ultrasound, CTA chest negative for PE.  PT/OT recommended SNF.  Due to patient's advanced age and comorbidity, palliative care was involved and patient was made DNR.     Assessment & Plan:   Principal Problem:   Acute metabolic encephalopathy Active Problems:   Hypothyroidism   Dehydration   Fall   Nasal bone fractures   Pressure injury of skin   FTT/falls/increased confusion -Unfortunately this is secondary to advanced age and comorbidities.  No obvious evidence of infection or reversible causes have been found.  Her UA is negative, COVID, flu was negative.  TSH/B12 unremarkable.  Echocardiogram shows EF of 75% with grade 1 DD, carotid ultrasound unremarkable.  CTA of the head is negative.     Nondisplaced nasal bone fracture Case discussed with ENT Dr. Janace Hoard by the previous provider-chronic.  No evidence of bleeding.  Stable.  Who recommend conservative management, does not need ENT follow-up Resume plavix   Thrombocytopenia Mild, platelet 140 on presentation, no active bleeding  repeat CBC in the morning   Hypertension -Resume Norvasc.  Supportive care-on Depakote-continue Synthroid   Prior history of CVA Continue Plavix, report take Livalo once a week   Prior history of seizure Take a low dose  Depakote at home, Depakote level 25    Hypothyroidism TSH unremarkable, continue Synthroid   Several CTA chest findings: -Small hiatal hernia with increasingly thickened distal thoracic esophagus. Consider endoscopic follow-up., will see how patient eat once her mental status improves -Severe thoracic kyphosis, osteopenia and degenerative change, and chronic appearing compression fractures in lower thoracic spine greatest at T10 -Three-vessel calcific CAD, small pericardial effusion, and moderate to heavy aortic mixed plaques with ulcerative plaque of the descending segment,  blood pressure stable  Pressure injury , presents on admission: Pressure Injury 01/13/21 Heel Left;Right Stage 1 -  Intact skin with non-blanchable redness of a localized area usually over a bony prominence. red, non blanchable (Active)  01/13/21 1508  Location: Heel  Location Orientation: Left;Right  Staging: Stage 1 -  Intact skin with non-blanchable redness of a localized area usually over a bony prominence.  Wound Description (Comments): red, non blanchable  Present on Admission: Yes        DVT prophylaxis: SCDs Code Status: DNR Family Communication: Son updated  Status is: Inpatient  Patient is overall medically stable, TOC team working with the family for safe disposition.       Nutritional status           Body mass index is 22.37 kg/m.  Pressure Injury 01/13/21 Heel Left;Right Stage 1 -  Intact skin with non-blanchable redness of a localized area usually over a bony prominence. red, non blanchable (Active)  01/13/21 1508  Location: Heel  Location Orientation: Left;Right  Staging: Stage 1 -  Intact skin with non-blanchable redness of a localized area usually over a bony prominence.  Wound Description (Comments): red, non blanchable  Present  on Admission: Yes          Subjective: She is laying in the bed, no complaints.  Review of Systems Otherwise negative except as  per HPI, including: General: Denies fever, chills, night sweats or unintended weight loss. Resp: Denies cough, wheezing, shortness of breath. Cardiac: Denies chest pain, palpitations, orthopnea, paroxysmal nocturnal dyspnea. GI: Denies abdominal pain, nausea, vomiting, diarrhea or constipation GU: Denies dysuria, frequency, hesitancy or incontinence MS: Denies muscle aches, joint pain or swelling Neuro: Denies headache, neurologic deficits (focal weakness, numbness, tingling), abnormal gait Psych: Denies anxiety, depression, SI/HI/AVH Skin: Denies new rashes or lesions ID: Denies sick contacts, exotic exposures, travel  Examination:  General exam: Not in acute distress but elderly frail Respiratory system: Clear to auscultation. Respiratory effort normal. Cardiovascular system: S1 & S2 heard, RRR. No JVD, murmurs, rubs, gallops or clicks. No pedal edema. Gastrointestinal system: Abdomen is nondistended, soft and nontender. No organomegaly or masses felt. Normal bowel sounds heard. Central nervous system: Alert and oriented. No focal neurological deficits. Extremities: Symmetric 4 x 5 power. Skin: No rashes, lesions or ulcers Psychiatry: Poor judgment and insight.  Alert to name and place  Objective: Vitals:   01/14/21 0033 01/14/21 0401 01/14/21 0810 01/14/21 1138  BP: (!) 166/96 (!) 178/115 140/64 (!) 144/74  Pulse: (!) 102 (!) 102 92 93  Resp: 16 17 15 17   Temp: 97.6 F (36.4 C) 98 F (36.7 C) 98.1 F (36.7 C) 98.9 F (37.2 C)  TempSrc: Oral Oral  Oral  SpO2: 96% 97% 95% 96%  Weight: 53.7 kg     Height:        Intake/Output Summary (Last 24 hours) at 01/14/2021 1401 Last data filed at 01/14/2021 1234 Gross per 24 hour  Intake 1552.32 ml  Output 1300 ml  Net 252.32 ml   Filed Weights   01/13/21 1500 01/14/21 0033  Weight: 53.3 kg 53.7 kg     Data Reviewed:   CBC: Recent Labs  Lab 01/12/21 1740 01/13/21 0313 01/14/21 0208  WBC 11.9* 9.9 7.7  NEUTROABS  10.1*  --  6.0  HGB 12.8 11.3* 11.0*  HCT 40.1 35.7* 33.6*  MCV 92.0 92.0 91.1  PLT 140* 123* 323*   Basic Metabolic Panel: Recent Labs  Lab 01/12/21 1740 01/14/21 0208  NA 138 132*  K 3.8 3.5  CL 99 98  CO2 27 26  GLUCOSE 134* 107*  BUN 20 9  CREATININE 0.70 0.49  CALCIUM 9.4 8.3*  MG 2.0 1.8  PHOS 3.0  --    GFR: Estimated Creatinine Clearance: 31.7 mL/min (by C-G formula based on SCr of 0.49 mg/dL). Liver Function Tests: Recent Labs  Lab 01/12/21 1740  AST 17  ALT 8  ALKPHOS 54  BILITOT 0.9  PROT 6.9  ALBUMIN 3.6   No results for input(s): LIPASE, AMYLASE in the last 168 hours. Recent Labs  Lab 01/13/21 0009  AMMONIA 17   Coagulation Profile: No results for input(s): INR, PROTIME in the last 168 hours. Cardiac Enzymes: Recent Labs  Lab 01/12/21 1755  CKTOTAL 34*   BNP (last 3 results) No results for input(s): PROBNP in the last 8760 hours. HbA1C: No results for input(s): HGBA1C in the last 72 hours. CBG: Recent Labs  Lab 01/13/21 1216  GLUCAP 95   Lipid Profile: No results for input(s): CHOL, HDL, LDLCALC, TRIG, CHOLHDL, LDLDIRECT in the last 72 hours. Thyroid Function Tests: Recent Labs    01/12/21 1740  TSH 1.273   Anemia Panel: Recent  Labs    01/13/21 0009  VITAMINB12 3,020*   Sepsis Labs: No results for input(s): PROCALCITON, LATICACIDVEN in the last 168 hours.  Recent Results (from the past 240 hour(s))  Resp Panel by RT-PCR (Flu A&B, Covid) Nasopharyngeal Swab     Status: None   Collection Time: 01/12/21  7:45 PM   Specimen: Nasopharyngeal Swab; Nasopharyngeal(NP) swabs in vial transport medium  Result Value Ref Range Status   SARS Coronavirus 2 by RT PCR NEGATIVE NEGATIVE Final    Comment: (NOTE) SARS-CoV-2 target nucleic acids are NOT DETECTED.  The SARS-CoV-2 RNA is generally detectable in upper respiratory specimens during the acute phase of infection. The lowest concentration of SARS-CoV-2 viral copies this assay can  detect is 138 copies/mL. A negative result does not preclude SARS-Cov-2 infection and should not be used as the sole basis for treatment or other patient management decisions. A negative result may occur with  improper specimen collection/handling, submission of specimen other than nasopharyngeal swab, presence of viral mutation(s) within the areas targeted by this assay, and inadequate number of viral copies(<138 copies/mL). A negative result must be combined with clinical observations, patient history, and epidemiological information. The expected result is Negative.  Fact Sheet for Patients:  EntrepreneurPulse.com.au  Fact Sheet for Healthcare Providers:  IncredibleEmployment.be  This test is no t yet approved or cleared by the [place] FDA and  has been authorized for detection and/or diagnosis of SARS-CoV-2 by FDA under an Emergency Use Authorization (EUA). This EUA will remain  in effect (meaning this test can be used) for the duration of the COVID-19 declaration under Section 564(b)(1) of the Act, 21 U.S.C.section 360bbb-3(b)(1), unless the authorization is terminated  or revoked sooner.       Influenza A by PCR NEGATIVE NEGATIVE Final   Influenza B by PCR NEGATIVE NEGATIVE Final    Comment: (NOTE) The Xpert Xpress SARS-CoV-2/FLU/RSV plus assay is intended as an aid in the diagnosis of influenza from Nasopharyngeal swab specimens and should not be used as a sole basis for treatment. Nasal washings and aspirates are unacceptable for Xpert Xpress SARS-CoV-2/FLU/RSV testing.  Fact Sheet for Patients: EntrepreneurPulse.com.au  Fact Sheet for Healthcare Providers: IncredibleEmployment.be  This test is not yet approved or cleared by the [place] FDA and has been authorized for detection and/or diagnosis of SARS-CoV-2 by FDA under an Emergency Use Authorization (EUA). This EUA will remain in  effect (meaning this test can be used) for the duration of the COVID-19 declaration under Section 564(b)(1) of the Act, 21 U.S.C. section 360bbb-3(b)(1), unless the authorization is terminated or revoked.  Performed at [facility], [place] [address], [place], [place] [address]   Urine Culture     Status: None   Collection Time: 01/12/21 10:29 PM   Specimen: Urine, Clean Catch  Result Value Ref Range Status   Specimen Description URINE, CLEAN CATCH  Final   Special Requests NONE  Final   Culture   Final    NO GROWTH Performed at [facility], [place] [address], [place], [place] [address]    Report Status 01/14/2021 FINAL  Final         Radiology Studies: DG Shoulder Right  Result Date: 01/12/2021 CLINICAL DATA:  fall, right shoulder pain EXAM: RIGHT SHOULDER - 2+ VIEW; RIGHT HUMERUS - 2+ VIEW COMPARISON:  None. FINDINGS: There is no evidence of fracture or dislocation. Severe degenerative changes of the right shoulder. Visualized portions of the right elbow grossly unremarkable with limited  evaluation due to positioning/nondedicated view. Soft tissues are unremarkable. IMPRESSION: No acute displaced fracture or dislocation of the right humerus or shoulder. Electronically Signed   By: Iven Finn M.D.   On: 01/12/2021 18:55   DG Knee 2 Views Left  Result Date: 01/12/2021 CLINICAL DATA:  Fall EXAM: LEFT KNEE - 1-2 VIEW COMPARISON:  None. FINDINGS: No evidence of acute fracture or malalignment. Trace knee joint effusion, nonspecific. Severe tricompartmental osteoarthritis, most advanced in the lateral compartment. Diffuse bony demineralization. Soft tissue swelling is seen about the knee. Advanced atherosclerotic vascular calcifications. IMPRESSION: 1. No acute fracture or malalignment. 2. Severe tricompartmental osteoarthritis, most advanced in the lateral compartment. Electronically Signed   By: Davina Poke D.O.   On: 01/12/2021 18:59   CT Angio Chest Pulmonary  Embolism (PE) W or WO Contrast  Result Date: 01/13/2021 CLINICAL DATA:  Suspected pulmonary embolism. Recent fall injury as well. EXAM: CT ANGIOGRAPHY CHEST WITH CONTRAST TECHNIQUE: Multidetector CT imaging of the chest was performed using the standard protocol during bolus administration of intravenous contrast. Multiplanar CT image reconstructions and MIPs were obtained to evaluate the vascular anatomy. CONTRAST:  93mL ISOVUE-370 IOPAMIDOL (ISOVUE-370) INJECTION 76% COMPARISON:  No prior chest CT for comparison. Last abdomen and pelvis CT is available for comparison, dated 01/11/2005. FINDINGS: Cardiovascular: The heart is slightly enlarged and there is a small pericardial effusion in the superior recess. The coronary arteries are increasingly heavily calcified and there is calcification in the posterior mitral ring. Pulmonary arteries are normal in caliber without evidence of thromboemboli. There are mildly distended superior pulmonary veins. There is no aortic aneurysm or dissection, there is moderate to heavy patchy mixed plaque in the arch and descending segments with moderate descending segment tortuosity and with scattered ulcerative plaque in the descending segment. Mediastinum/Nodes: Heterogeneous 1.2 cm nodule in the left lobe of the thyroid gland and subcentimeter hypodense nodule in the right lobe. There are mildly prominent lymph nodes in the subcarinal mediastinum to the right up to 1.3 cm short axis. There is a small hiatal hernia and a moderately thickened distal thoracic esophagus, mild patulous appearance of the upper to the midthoracic esophagus. Lungs/Pleura: There are bilateral minimal layering pleural effusions. This was seen on prior study as well. There are subpleural chronic reticulated interstitial changes of lungs at all levels but with a basal predominance, consistent with UIP fibrosis pattern without honeycombing. Lungs are hyperinflated without evidence of nodule or consolidated  infiltrate but there may be slight interstitial edema in the extreme lung bases. The trachea and central airways are clear. Upper Abdomen: No acute abnormality. Visualized abdominal aorta, proximal celiac artery and proximal SMA are heavily calcified. Musculoskeletal: There is pronounced thoracic kyphosis. There is osteopenia. There is a severe anterior wedge compression fracture of the T10 vertebral body with chronic appearance, mild wedging of the T8, T9, and T11 vertebrae, without retropulsion and with degenerative changes of the spine and shoulders. There is no displaced rib or sternal fracture or chest wall hematoma. Review of the MIP images confirms the above findings. IMPRESSION: 1. Cardiomegaly with mildly distended superior pulmonary veins and questionable slight interstitial edema in the base of the lungs with minimal pleural effusions. 2. No evidence of pulmonary arterial embolism or arterial dilatation. 3. Three-vessel calcific CAD, small pericardial effusion, and moderate to heavy aortic mixed plaques with ulcerative plaque of the descending segment. 4. Basal predominant subpleural interstitial reticulation. 5. 1.2 cm left thyroid nodule.  No imaging follow-up is required. 6. Small hiatal hernia  with increasingly thickened distal thoracic esophagus. Consider endoscopic follow-up. 7. Mildly prominent subcarinal lymph nodes the right but no bulky or encasing adenopathy or further significant lymph nodes. 8. Severe thoracic kyphosis, osteopenia and degenerative change, and chronic appearing compression fractures in lower thoracic spine greatest at T10. This was not seen on the prior abdomen pelvis CT. Electronically Signed   By: Telford Nab M.D.   On: 01/13/2021 02:17   DG CHEST PORT 1 VIEW  Result Date: 01/13/2021 CLINICAL DATA:  Leukocytosis EXAM: PORTABLE CHEST 1 VIEW COMPARISON:  03/19/2014 FINDINGS: Cardiac shadow is within normal limits. Extrinsic density is noted over the left apex when  compared with recent CT examinations. Lungs are clear bilaterally. Patient rotation accentuates the mediastinal markings. Mild aortic calcifications are seen. No bony abnormality is noted. IMPRESSION: No acute abnormality seen. Electronically Signed   By: Inez Catalina M.D.   On: 01/13/2021 00:05   DG Humerus Right  Result Date: 01/12/2021 CLINICAL DATA:  fall, right shoulder pain EXAM: RIGHT SHOULDER - 2+ VIEW; RIGHT HUMERUS - 2+ VIEW COMPARISON:  None. FINDINGS: There is no evidence of fracture or dislocation. Severe degenerative changes of the right shoulder. Visualized portions of the right elbow grossly unremarkable with limited evaluation due to positioning/nondedicated view. Soft tissues are unremarkable. IMPRESSION: No acute displaced fracture or dislocation of the right humerus or shoulder. Electronically Signed   By: Iven Finn M.D.   On: 01/12/2021 18:55   EEG adult  Result Date: 01/13/2021 Lora Havens, MD     01/13/2021 11:33 AM Patient Name: NATIA FAHMY MRN: [id number] Epilepsy Attending: Lora Havens Referring Physician/Provider: Dr Derrick Ravel Date: 01/13/2021 Duration: 22.33 mins Patient history: 87yo F with h/o epilepsy presented with ams, falls. EEG to evaluate for seizure Level of alertness: Awake AEDs during EEG study: VPA Technical aspects: This EEG study was done with scalp electrodes positioned according to the 10-20 International system of electrode placement. Electrical activity was acquired at a sampling rate of 500Hz  and reviewed with a high frequency filter of 70Hz  and a low frequency filter of 1Hz . EEG data were recorded continuously and digitally stored. Description: The posterior dominant rhythm consists of 8-9 Hz activity of moderate voltage (25-35 uV) seen predominantly in posterior head regions, symmetric and reactive to eye opening and eye closing. EEG showed intermittent generalized, at times sharply contoured  3 to 6 Hz theta-delta slowing.  Hyperventilation and photic stimulation were not performed.   ABNORMALITY - Intermittent slow, generalized IMPRESSION: This study is suggestive of mild diffuse encephalopathy, nonspecific etiology. No seizures or epileptiform discharges were seen throughout the recording. Lora Havens   ECHOCARDIOGRAM COMPLETE  Result Date: 01/13/2021    ECHOCARDIOGRAM REPORT   Patient Name:   SHARLON PFOHL Date of Exam: 01/13/2021 Medical Rec #:  [id number]    Height:       61.0 in Accession #:    [id number]   Weight:       125.0 lb Date of Birth:  Jan 14, 1924    BSA:          1.547 m Patient Age:    89 years     BP:           167/92 mmHg Patient Gender: F            HR:           101 bpm. Exam Location:  Inpatient Procedure: 2D Echo Indications:    syncope  History:  Patient has prior history of Echocardiogram examinations, most                 recent 01/27/2019. Risk Factors:Dyslipidemia and Hypertension.  Sonographer:    Johny Chess RDCS Referring Phys: [id number] [place]  1. Left ventricular ejection fraction, by estimation, is >75%. The left ventricle has hyperdynamic function. The left ventricle has no regional wall motion abnormalities. Left ventricular diastolic parameters are consistent with Grade I diastolic dysfunction (impaired relaxation).  2. Right ventricular systolic function is normal. The right ventricular size is normal. There is normal pulmonary artery systolic pressure.  3. The mitral valve is normal in structure. No evidence of mitral valve regurgitation. No evidence of mitral stenosis. Moderate mitral annular calcification.  4. The aortic valve is tricuspid. Aortic valve regurgitation is not visualized. Aortic valve sclerosis is present, with no evidence of aortic valve stenosis.  5. The inferior vena cava is normal in size with greater than 50% respiratory variability, suggesting right atrial pressure of 3 mmHg. FINDINGS  Left Ventricle: Left ventricular ejection fraction,  by estimation, is >75%. The left ventricle has hyperdynamic function. The left ventricle has no regional wall motion abnormalities. The left ventricular internal cavity size was normal in size. There is no left ventricular hypertrophy. Left ventricular diastolic parameters are consistent with Grade I diastolic dysfunction (impaired relaxation). Right Ventricle: The right ventricular size is normal. Right ventricular systolic function is normal. There is normal pulmonary artery systolic pressure. The tricuspid regurgitant velocity is 2.31 m/s, and with an assumed right atrial pressure of 3 mmHg,  the estimated right ventricular systolic pressure is 81.0 mmHg. Left Atrium: Left atrial size was normal in size. Right Atrium: Right atrial size was normal in size. Pericardium: There is no evidence of pericardial effusion. Mitral Valve: The mitral valve is normal in structure. Moderate mitral annular calcification. No evidence of mitral valve regurgitation. No evidence of mitral valve stenosis. Tricuspid Valve: The tricuspid valve is normal in structure. Tricuspid valve regurgitation is mild . No evidence of tricuspid stenosis. Aortic Valve: The aortic valve is tricuspid. Aortic valve regurgitation is not visualized. Aortic valve sclerosis is present, with no evidence of aortic valve stenosis. Pulmonic Valve: The pulmonic valve was normal in structure. Pulmonic valve regurgitation is not visualized. No evidence of pulmonic stenosis. Aorta: The aortic root is normal in size and structure. Venous: The inferior vena cava is normal in size with greater than 50% respiratory variability, suggesting right atrial pressure of 3 mmHg. IAS/Shunts: No atrial level shunt detected by color flow Doppler.  LEFT VENTRICLE PLAX 2D LVIDd:         3.50 cm   Diastology LVIDs:         2.20 cm   LV e' medial:    6.09 cm/s LV PW:         1.00 cm   LV E/e' medial:  12.0 LV IVS:        0.90 cm   LV e' lateral:   10.10 cm/s LVOT diam:     1.60 cm    LV E/e' lateral: 7.2 LV SV:         42 LV SV Index:   27 LVOT Area:     2.01 cm  RIGHT VENTRICLE RV S prime:     18.80 cm/s TAPSE (M-mode): 1.4 cm LEFT ATRIUM             Index        RIGHT ATRIUM  Index LA diam:        2.80 cm 1.81 cm/m   RA Area:     9.54 cm LA Vol (A2C):   24.4 ml 15.77 ml/m  RA Volume:   19.40 ml 12.54 ml/m LA Vol (A4C):   28.3 ml 18.30 ml/m LA Biplane Vol: 27.3 ml 17.65 ml/m  AORTIC VALVE LVOT Vmax:   136.00 cm/s LVOT Vmean:  93.600 cm/s LVOT VTI:    0.207 m  AORTA Ao Asc diam: 3.10 cm MITRAL VALVE                TRICUSPID VALVE MV Area (PHT): 4.29 cm     TR Peak grad:   21.3 mmHg MV Decel Time: 177 msec     TR Vmax:        231.00 cm/s MV E velocity: 72.80 cm/s MV A velocity: 134.00 cm/s  SHUNTS MV E/A ratio:  0.54         Systemic VTI:  0.21 m                             Systemic Diam: 1.60 cm Kirk Ruths MD Electronically signed by Kirk Ruths MD Signature Date/Time: 01/13/2021/1:47:43 PM    Final    VAS US CAROTID  Result Date: 01/13/2021 Carotid Arterial Duplex Study Patient Name:  SALLY-ANNE WAMBLE  Date of Exam:   01/13/2021 Medical Rec #: [id number]     Accession #:    [id number] Date of Birth: 1923-10-20     Patient Gender: F Patient Age:   65 years Exam [facility]  [facility] Procedure:      VAS US CAROTID Referring Phys: Wandra Feinstein RATHORE --------------------------------------------------------------------------------  Indications:       Syncope and Multiple falls, confusion. Risk Factors:      Hypertension, hyperlipidemia, prior CVA. Comparison Study:  Prior Carotid duplex indicating 1-39% ICA stenosis was done                    03/20/2016 Performing Technologist: Sharion Dove RVS  Examination Guidelines: A complete evaluation includes B-mode imaging, spectral Doppler, color Doppler, and power Doppler as needed of all accessible portions of each vessel. Bilateral testing is considered an integral part of a complete examination. Limited examinations for  reoccurring indications may be performed as noted.  Right Carotid Findings: +----------+--------+--------+--------+------------------+------------------+           PSV cm/sEDV cm/sStenosisPlaque DescriptionComments           +----------+--------+--------+--------+------------------+------------------+ CCA Prox  69      10                                intimal thickening +----------+--------+--------+--------+------------------+------------------+ CCA Distal67      8                                 intimal thickening +----------+--------+--------+--------+------------------+------------------+ ICA Prox  62      15              calcific          Shadowing          +----------+--------+--------+--------+------------------+------------------+ ICA Distal72      15                                                   +----------+--------+--------+--------+------------------+------------------+  ECA       116     12                                                   +----------+--------+--------+--------+------------------+------------------+ +----------+--------+-------+--------+-------------------+           PSV cm/sEDV cmsDescribeArm Pressure (mmHG) +----------+--------+-------+--------+-------------------+ [id number]                                         +----------+--------+-------+--------+-------------------+ +---------+--------+--+--------+--+ VertebralPSV cm/s39EDV cm/s11 +---------+--------+--+--------+--+  Left Carotid Findings: +----------+--------+--------+--------+------------------+------------------+           PSV cm/sEDV cm/sStenosisPlaque DescriptionComments           +----------+--------+--------+--------+------------------+------------------+ CCA Prox  61      8                                 intimal thickening +----------+--------+--------+--------+------------------+------------------+ CCA Distal67      10                                 intimal thickening +----------+--------+--------+--------+------------------+------------------+ ICA Prox  99      17              calcific                             +----------+--------+--------+--------+------------------+------------------+ ICA Distal73      13                                                   +----------+--------+--------+--------+------------------+------------------+ ECA       263     4               calcific                             +----------+--------+--------+--------+------------------+------------------+ +----------+--------+--------+--------+-------------------+           PSV cm/sEDV cm/sDescribeArm Pressure (mmHG) +----------+--------+--------+--------+-------------------+ [id number]                                          +----------+--------+--------+--------+-------------------+ +---------+--------+--+--------+-+ VertebralPSV cm/s33EDV cm/s6 +---------+--------+--+--------+-+   Summary: Right Carotid: Velocities in the right ICA are consistent with a 1-39% stenosis. Left Carotid: There is no evidence of stenosis in the left ICA. Vertebrals:  Bilateral vertebral arteries demonstrate antegrade flow. Subclavians: Normal flow hemodynamics were seen in bilateral subclavian              arteries. *See table(s) above for measurements and observations.  Electronically signed by Jamelle Haring on 01/13/2021 at 11:58:39 AM.    Final         Scheduled Meds:  clopidogrel  75 mg Oral Daily   divalproex  500 mg Oral q morning   levothyroxine  100 mcg Oral Once per day on Mon Tue  Wed Thu Fri Sat   mouth rinse  15 mL Mouth Rinse BID   pantoprazole  40 mg Oral QPM   sertraline  50 mg Oral Daily   Continuous Infusions:  sodium chloride 75 mL/hr at 01/14/21 1112     LOS: 0 days   Time spent= 35 mins    Nike Southwell Arsenio Loader, MD [facility]  If 7PM-7AM, please contact night-coverage  01/14/2021, 2:01 PM

## 2021-01-15 DIAGNOSIS — G9341 Metabolic encephalopathy: Secondary | ICD-10-CM | POA: Diagnosis not present

## 2021-01-15 LAB — RESP PANEL BY RT-PCR (FLU A&B, COVID) ARPGX2
Influenza A by PCR: NEGATIVE
Influenza B by PCR: NEGATIVE
SARS Coronavirus 2 by RT PCR: NEGATIVE

## 2021-01-15 NOTE — TOC Progression Note (Signed)
Transition of Care [facility]) - Progression Note    Patient Details  Name: Danielle Bryan MRN: [id number] Date of Birth: 02-13-1924  Transition of Care [facility]) CM/SW [place], [place] Phone Number: 01/15/2021, 1:00 PM  Clinical Narrative:    CSW spoke with pt son who has chosen [facility], [place] contacted [place] at [facility]  to confirm bed. Lexine Baton stated she will begin auth when she gets an updated PT note. Nikki informed CSW the Josem Kaufmann will probably not be back today so pt can DC tomorrow.   Expected Discharge Plan: [place] Barriers to Discharge: Continued Medical Work up  Expected Discharge Plan and Services Expected Discharge Plan: [place] In-house Referral: Clinical Social Work   Post Acute Care Choice: [place] Living arrangements for the past 2 months: Single Family Home                                       Social Determinants of Health (SDOH) Interventions    Readmission Risk Interventions No flowsheet data found.

## 2021-01-15 NOTE — Progress Notes (Signed)
Physical Therapy Treatment Patient Details Name: Danielle Bryan MRN: [id number] DOB: 27-Aug-1923 Today's Date: 01/15/2021   History of Present Illness Danielle Bryan is a 87 y.o. female presenting to the ED via EMS for evaluation of nose injury/bleed after a mechanical fall on 01/12/21.  Family reported poor appetite and multiple recent falls due to generalized weakness. Patient lives alone.CT maxillofacial showing possible nondisplaced fractures of the nasal bones; all other imaging negative.  Pt has had palliative consult - family wants SNF but would consider hospice if not improving. PHMx: hypertension, hyperlipidemia, hypothyroidism, osteoarthritis, stroke/TIA on Plavix, seizure disorder, GERD, depression.    PT Comments    Pt was able to progress to a few steps but fatigued easily.  Required A of 2 for safety and only able to ambulate 5'.  Further treatment limited due to fatigued and wanting to lay down.  Continue POC   Recommendations for follow up therapy are one component of a multi-disciplinary discharge planning process, led by the attending physician.  Recommendations may be updated based on patient status, additional functional criteria and insurance authorization.  Follow Up Recommendations  Skilled nursing-short term rehab (<3 hours/day)     Assistance Recommended at Discharge Frequent or constant Supervision/Assistance  Equipment Recommendations  Wheelchair (measurements PT);Wheelchair cushion (measurements PT);BSC/3in1    Recommendations for Other Services       Precautions / Restrictions Precautions Precautions: Fall     Mobility  Bed Mobility Overal bed mobility: Needs Assistance Bed Mobility: Supine to Sit;Sit to Supine     Supine to sit: Min assist Sit to supine: Min assist   General bed mobility comments: Increased time; cues for rail use    Transfers Overall transfer level: Needs assistance Equipment used: Rolling walker (2 wheels) Transfers: Sit to/from  Stand Sit to Stand: Min assist;+2 physical assistance           General transfer comment: Pt with min A of 2 to stand with cues for hand placement, increased time to rise, unable to stand completely upright (forward trunk and head with slightly flexed knees - suspect baseline posture)    Ambulation/Gait Ambulation/Gait assistance: Min assist;+2 safety/equipment Gait Distance (Feet): 5 Feet Assistive device: Rolling walker (2 wheels) Gait Pattern/deviations: Step-to pattern;Decreased stride length;Shuffle;Trunk flexed Gait velocity: decraesed     General Gait Details: Pt with very small steps requiring min A for balance and turning RW; had assist available if pt fatigued and needed +2 but she did not in the 5' that she ambulated   Stairs             Wheelchair Mobility    Modified Rankin (Stroke Patients Only)       Balance Overall balance assessment: Needs assistance Sitting-balance support: No upper extremity supported;Feet supported Sitting balance-Leahy Scale: Good     Standing balance support: Bilateral upper extremity supported Standing balance-Leahy Scale: Poor Standing balance comment: requiring RW and min A                            Cognition Arousal/Alertness: Awake/alert Behavior During Therapy:  (tearful at times - reports "I'm tired") Overall Cognitive Status: No family/caregiver present to determine baseline cognitive functioning                                 General Comments: Pt following commands with increased time, answer questions approprately  Exercises      General Comments General comments (skin integrity, edema, etc.): VSS on RA; fatigued eaisly      Pertinent Vitals/Pain Pain Assessment: No/denies pain    Home Living                          Prior Function            PT Goals (current goals can now be found in the care plan section) Progress towards PT goals: Progressing  toward goals    Frequency    Min 2X/week      PT Plan Current plan remains appropriate    Co-evaluation              AM-PAC PT "6 Clicks" Mobility   Outcome Measure  Help needed turning from your back to your side while in a flat bed without using bedrails?: A Little Help needed moving from lying on your back to sitting on the side of a flat bed without using bedrails?: A Little Help needed moving to and from a bed to a chair (including a wheelchair)?: Total Help needed standing up from a chair using your arms (e.g., wheelchair or bedside chair)?: Total Help needed to walk in hospital room?: Total Help needed climbing 3-5 steps with a railing? : Total 6 Click Score: 10    End of Session Equipment Utilized During Treatment: Gait belt Activity Tolerance: Patient limited by fatigue (very fatigued after ambulation and wanted to lay down) Patient left: in bed;with call bell/phone within reach;with bed alarm set Nurse Communication: Mobility status PT Visit Diagnosis: Other abnormalities of gait and mobility (R26.89);Muscle weakness (generalized) (M62.81)     Time: 2637-8588 PT Time Calculation (min) (ACUTE ONLY): 19 min  Charges:  $Gait Training: 8-22 mins                     Abran Richard, PT Acute Rehab Services Pager [phone] [facility] [place] 01/15/2021, 1:16 PM

## 2021-01-15 NOTE — Progress Notes (Signed)
PROGRESS NOTE    Danielle Bryan  VLD:[id number] DOB: [birth year] DOA: 01/12/2021 PCP: Velna Hatchet, MD   Brief Narrative:  87 y.o. female with medical history significant of hypertension, hyperlipidemia, hypothyroidism, osteoarthritis, stroke/TIA on Plavix, seizure disorder, GERD, depression presenting to the ED via EMS for evaluation of nose injury/bleed after a mechanical fall.  Family reported poor appetite and multiple recent falls due to generalized weakness, family also reported progressive confusion over the last year.  Unfortunately patient is suffering mainly from failure to thrive secondary to her advanced age.  Her inpatient work-up including echocardiogram, carotid ultrasound, CTA chest negative for PE.  PT/OT recommended SNF.  Due to patient's advanced age and comorbidity, palliative care was involved and patient was made DNR. TOC working with the patient's son for safe disposition.      Assessment & Plan:   Principal Problem:   Acute metabolic encephalopathy Active Problems:   Hypothyroidism   Dehydration   Fall   Nasal bone fractures   Pressure injury of skin   FTT/falls/increased confusion; improved  -Unfortunately this is secondary to advanced age and comorbidities.  No obvious evidence of infection or reversible causes have been found.  Her UA is negative, COVID, flu was negative.  TSH/B12 unremarkable.  Echocardiogram shows EF of 75% with grade 1 DD, carotid ultrasound unremarkable.  CTA of the head is negative.     Nondisplaced nasal bone fracture Case discussed with ENT Dr. Janace Hoard by the previous provider-chronic.  No evidence of bleeding.  Stable.  Who recommend conservative management, does not need ENT follow-up Resume plavix   Thrombocytopenia Mild, platelet 128 on presentation, no active bleeding  repeat CBC in the morning   Hypertension -Resume Norvasc.  Supportive care-on Depakote-continue Synthroid   Prior history of CVA Continue Plavix, report take  Livalo once a week   Prior history of seizure Take a low dose Depakote at home, Depakote level 25    Hypothyroidism TSH unremarkable, continue Synthroid   Several CTA chest findings: -Small hiatal hernia with increasingly thickened distal thoracic esophagus. Consider endoscopic follow-up., will see how patient eat once her mental status improves -Severe thoracic kyphosis, osteopenia and degenerative change, and chronic appearing compression fractures in lower thoracic spine greatest at T10 -Three-vessel calcific CAD, small pericardial effusion, and moderate to heavy aortic mixed plaques with ulcerative plaque of the descending segment,  blood pressure stable  Pressure injury , presents on admission: Pressure Injury 01/13/21 Heel Left;Right Stage 1 -  Intact skin with non-blanchable redness of a localized area usually over a bony prominence. red, non blanchable (Active)  01/13/21 1508  Location: Heel  Location Orientation: Left;Right  Staging: Stage 1 -  Intact skin with non-blanchable redness of a localized area usually over a bony prominence.  Wound Description (Comments): red, non blanchable  Present on Admission: Yes        DVT prophylaxis: SCDs Code Status: DNR Family Communication: Son, [name], updated.   Status is: Inpatient  Patient is overall medically stable, TOC team working with the family for safe disposition.       Nutritional status           Body mass index is 21.99 kg/m.  Pressure Injury 01/13/21 Heel Left;Right Stage 1 -  Intact skin with non-blanchable redness of a localized area usually over a bony prominence. red, non blanchable (Active)  01/13/21 1508  Location: Heel  Location Orientation: Left;Right  Staging: Stage 1 -  Intact skin with non-blanchable redness of a localized area  usually over a bony prominence.  Wound Description (Comments): red, non blanchable  Present on Admission: Yes          Subjective: Sitting up in the  bed attempting to eat her breakfast. She tells me she feels a little better.   Review of Systems Otherwise negative except as per HPI, including: General: Denies fever, chills, night sweats or unintended weight loss. Resp: Denies cough, wheezing, shortness of breath. Cardiac: Denies chest pain, palpitations, orthopnea, paroxysmal nocturnal dyspnea. GI: Denies abdominal pain, nausea, vomiting, diarrhea or constipation GU: Denies dysuria, frequency, hesitancy or incontinence MS: Denies muscle aches, joint pain or swelling Neuro: Denies headache, neurologic deficits (focal weakness, numbness, tingling), abnormal gait Psych: Denies anxiety, depression, SI/HI/AVH Skin: Denies new rashes or lesions ID: Denies sick contacts, exotic exposures, travel  Examination:  Constitutional: Not in acute distress, elderly frail.  Sitting up in bed. Respiratory: Clear to auscultation bilaterally Cardiovascular: Normal sinus rhythm, no rubs Abdomen: Nontender nondistended good bowel sounds Musculoskeletal: No edema noted Skin: No rashes seen Neurologic: CN 2-12 grossly intact.  And nonfocal Psychiatric: Normal judgment and insight.  Today she is alert to name and place  Objective: Vitals:   01/14/21 2014 01/15/21 0001 01/15/21 0213 01/15/21 0344  BP: (!) 163/77 (!) 153/76  (!) 162/69  Pulse: (!) 104 (!) 104  (!) 109  Resp: 20 20  20   Temp: 98.4 F (36.9 C) 98 F (36.7 C)  97.7 F (36.5 C)  TempSrc: Oral Oral  Oral  SpO2: 97% 95%  94%  Weight:   52.8 kg   Height:        Intake/Output Summary (Last 24 hours) at 01/15/2021 1201 Last data filed at 01/15/2021 0900 Gross per 24 hour  Intake 580 ml  Output 400 ml  Net 180 ml   Filed Weights   01/13/21 1500 01/14/21 0033 01/15/21 0213  Weight: 53.3 kg 53.7 kg 52.8 kg     Data Reviewed:   CBC: Recent Labs  Lab 01/12/21 1740 01/13/21 0313 01/14/21 0208  WBC 11.9* 9.9 7.7  NEUTROABS 10.1*  --  6.0  HGB 12.8 11.3* 11.0*  HCT 40.1  35.7* 33.6*  MCV 92.0 92.0 91.1  PLT 140* 123* 517*   Basic Metabolic Panel: Recent Labs  Lab 01/12/21 1740 01/14/21 0208  NA 138 132*  K 3.8 3.5  CL 99 98  CO2 27 26  GLUCOSE 134* 107*  BUN 20 9  CREATININE 0.70 0.49  CALCIUM 9.4 8.3*  MG 2.0 1.8  PHOS 3.0  --    GFR: Estimated Creatinine Clearance: 31.7 mL/min (by C-G formula based on SCr of 0.49 mg/dL). Liver Function Tests: Recent Labs  Lab 01/12/21 1740  AST 17  ALT 8  ALKPHOS 54  BILITOT 0.9  PROT 6.9  ALBUMIN 3.6   No results for input(s): LIPASE, AMYLASE in the last 168 hours. Recent Labs  Lab 01/13/21 0009  AMMONIA 17   Coagulation Profile: No results for input(s): INR, PROTIME in the last 168 hours. Cardiac Enzymes: Recent Labs  Lab 01/12/21 1755  CKTOTAL 34*   BNP (last 3 results) No results for input(s): PROBNP in the last 8760 hours. HbA1C: No results for input(s): HGBA1C in the last 72 hours. CBG: Recent Labs  Lab 01/13/21 1216  GLUCAP 95   Lipid Profile: No results for input(s): CHOL, HDL, LDLCALC, TRIG, CHOLHDL, LDLDIRECT in the last 72 hours. Thyroid Function Tests: Recent Labs    01/12/21 1740  TSH 1.273   Anemia  Panel: Recent Labs    01/13/21 0009  VITAMINB12 3,020*   Sepsis Labs: No results for input(s): PROCALCITON, LATICACIDVEN in the last 168 hours.  Recent Results (from the past 240 hour(s))  Resp Panel by RT-PCR (Flu A&B, Covid) Nasopharyngeal Swab     Status: None   Collection Time: 01/12/21  7:45 PM   Specimen: Nasopharyngeal Swab; Nasopharyngeal(NP) swabs in vial transport medium  Result Value Ref Range Status   SARS Coronavirus 2 by RT PCR NEGATIVE NEGATIVE Final    Comment: (NOTE) SARS-CoV-2 target nucleic acids are NOT DETECTED.  The SARS-CoV-2 RNA is generally detectable in upper respiratory specimens during the acute phase of infection. The lowest concentration of SARS-CoV-2 viral copies this assay can detect is 138 copies/mL. A negative result does  not preclude SARS-Cov-2 infection and should not be used as the sole basis for treatment or other patient management decisions. A negative result may occur with  improper specimen collection/handling, submission of specimen other than nasopharyngeal swab, presence of viral mutation(s) within the areas targeted by this assay, and inadequate number of viral copies(<138 copies/mL). A negative result must be combined with clinical observations, patient history, and epidemiological information. The expected result is Negative.  Fact Sheet for Patients:  EntrepreneurPulse.com.au  Fact Sheet for Healthcare Providers:  IncredibleEmployment.be  This test is no t yet approved or cleared by the [place] FDA and  has been authorized for detection and/or diagnosis of SARS-CoV-2 by FDA under an Emergency Use Authorization (EUA). This EUA will remain  in effect (meaning this test can be used) for the duration of the COVID-19 declaration under Section 564(b)(1) of the Act, 21 U.S.C.section 360bbb-3(b)(1), unless the authorization is terminated  or revoked sooner.       Influenza A by PCR NEGATIVE NEGATIVE Final   Influenza B by PCR NEGATIVE NEGATIVE Final    Comment: (NOTE) The Xpert Xpress SARS-CoV-2/FLU/RSV plus assay is intended as an aid in the diagnosis of influenza from Nasopharyngeal swab specimens and should not be used as a sole basis for treatment. Nasal washings and aspirates are unacceptable for Xpert Xpress SARS-CoV-2/FLU/RSV testing.  Fact Sheet for Patients: EntrepreneurPulse.com.au  Fact Sheet for Healthcare Providers: IncredibleEmployment.be  This test is not yet approved or cleared by the [place] FDA and has been authorized for detection and/or diagnosis of SARS-CoV-2 by FDA under an Emergency Use Authorization (EUA). This EUA will remain in effect (meaning this test can be used) for the  duration of the COVID-19 declaration under Section 564(b)(1) of the Act, 21 U.S.C. section 360bbb-3(b)(1), unless the authorization is terminated or revoked.  Performed at [facility], [place] [address] [place], [place] [address]   Urine Culture     Status: None   Collection Time: 01/12/21 10:29 PM   Specimen: Urine, Clean Catch  Result Value Ref Range Status   Specimen Description URINE, CLEAN CATCH  Final   Special Requests NONE  Final   Culture   Final    NO GROWTH Performed at [facility], [place] [address] [place],  [address]    Report Status 01/14/2021 FINAL  Final         Radiology Studies: ECHOCARDIOGRAM COMPLETE  Result Date: 01/13/2021    ECHOCARDIOGRAM REPORT   Patient Name:   Danielle Bryan Date of Exam: 01/13/2021 Medical Rec #:  [id number]    Height:       61.0 in Accession #:    [id number]   Weight:  125.0 lb Date of Birth:  February 19, 1924    BSA:          1.547 m Patient Age:    89 years     BP:           167/92 mmHg Patient Gender: F            HR:           101 bpm. Exam Location:  Inpatient Procedure: 2D Echo Indications:    syncope  History:        Patient has prior history of Echocardiogram examinations, most                 recent 01/27/2019. Risk Factors:Dyslipidemia and Hypertension.  Sonographer:    Johny Chess RDCS Referring Phys: [id number] [place]  1. Left ventricular ejection fraction, by estimation, is >75%. The left ventricle has hyperdynamic function. The left ventricle has no regional wall motion abnormalities. Left ventricular diastolic parameters are consistent with Grade I diastolic dysfunction (impaired relaxation).  2. Right ventricular systolic function is normal. The right ventricular size is normal. There is normal pulmonary artery systolic pressure.  3. The mitral valve is normal in structure. No evidence of mitral valve regurgitation. No evidence of mitral stenosis. Moderate mitral annular calcification.   4. The aortic valve is tricuspid. Aortic valve regurgitation is not visualized. Aortic valve sclerosis is present, with no evidence of aortic valve stenosis.  5. The inferior vena cava is normal in size with greater than 50% respiratory variability, suggesting right atrial pressure of 3 mmHg. FINDINGS  Left Ventricle: Left ventricular ejection fraction, by estimation, is >75%. The left ventricle has hyperdynamic function. The left ventricle has no regional wall motion abnormalities. The left ventricular internal cavity size was normal in size. There is no left ventricular hypertrophy. Left ventricular diastolic parameters are consistent with Grade I diastolic dysfunction (impaired relaxation). Right Ventricle: The right ventricular size is normal. Right ventricular systolic function is normal. There is normal pulmonary artery systolic pressure. The tricuspid regurgitant velocity is 2.31 m/s, and with an assumed right atrial pressure of 3 mmHg,  the estimated right ventricular systolic pressure is 96.2 mmHg. Left Atrium: Left atrial size was normal in size. Right Atrium: Right atrial size was normal in size. Pericardium: There is no evidence of pericardial effusion. Mitral Valve: The mitral valve is normal in structure. Moderate mitral annular calcification. No evidence of mitral valve regurgitation. No evidence of mitral valve stenosis. Tricuspid Valve: The tricuspid valve is normal in structure. Tricuspid valve regurgitation is mild . No evidence of tricuspid stenosis. Aortic Valve: The aortic valve is tricuspid. Aortic valve regurgitation is not visualized. Aortic valve sclerosis is present, with no evidence of aortic valve stenosis. Pulmonic Valve: The pulmonic valve was normal in structure. Pulmonic valve regurgitation is not visualized. No evidence of pulmonic stenosis. Aorta: The aortic root is normal in size and structure. Venous: The inferior vena cava is normal in size with greater than 50% respiratory  variability, suggesting right atrial pressure of 3 mmHg. IAS/Shunts: No atrial level shunt detected by color flow Doppler.  LEFT VENTRICLE PLAX 2D LVIDd:         3.50 cm   Diastology LVIDs:         2.20 cm   LV e' medial:    6.09 cm/s LV PW:         1.00 cm   LV E/e' medial:  12.0 LV IVS:  0.90 cm   LV e' lateral:   10.10 cm/s LVOT diam:     1.60 cm   LV E/e' lateral: 7.2 LV SV:         42 LV SV Index:   27 LVOT Area:     2.01 cm  RIGHT VENTRICLE RV S prime:     18.80 cm/s TAPSE (M-mode): 1.4 cm LEFT ATRIUM             Index        RIGHT ATRIUM          Index LA diam:        2.80 cm 1.81 cm/m   RA Area:     9.54 cm LA Vol (A2C):   24.4 ml 15.77 ml/m  RA Volume:   19.40 ml 12.54 ml/m LA Vol (A4C):   28.3 ml 18.30 ml/m LA Biplane Vol: 27.3 ml 17.65 ml/m  AORTIC VALVE LVOT Vmax:   136.00 cm/s LVOT Vmean:  93.600 cm/s LVOT VTI:    0.207 m  AORTA Ao Asc diam: 3.10 cm MITRAL VALVE                TRICUSPID VALVE MV Area (PHT): 4.29 cm     TR Peak grad:   21.3 mmHg MV Decel Time: 177 msec     TR Vmax:        231.00 cm/s MV E velocity: 72.80 cm/s MV A velocity: 134.00 cm/s  SHUNTS MV E/A ratio:  0.54         Systemic VTI:  0.21 m                             Systemic Diam: 1.60 cm Kirk Ruths MD Electronically signed by Kirk Ruths MD Signature Date/Time: 01/13/2021/1:47:43 PM    Final         Scheduled Meds:  clopidogrel  75 mg Oral Daily   divalproex  500 mg Oral q morning   levothyroxine  100 mcg Oral Once per day on Mon Tue Wed Thu Fri Sat   mouth rinse  15 mL Mouth Rinse BID   pantoprazole  40 mg Oral QPM   sertraline  50 mg Oral Daily   Continuous Infusions:     LOS: 1 day   Time spent= 35 mins    Mylah Baynes Arsenio Loader, MD [facility]  If 7PM-7AM, please contact night-coverage  01/15/2021, 12:01 PM

## 2021-01-16 DIAGNOSIS — M170 Bilateral primary osteoarthritis of knee: Secondary | ICD-10-CM | POA: Diagnosis not present

## 2021-01-16 DIAGNOSIS — I69828 Other speech and language deficits following other cerebrovascular disease: Secondary | ICD-10-CM | POA: Diagnosis not present

## 2021-01-16 DIAGNOSIS — Z7401 Bed confinement status: Secondary | ICD-10-CM | POA: Diagnosis not present

## 2021-01-16 DIAGNOSIS — R531 Weakness: Secondary | ICD-10-CM | POA: Diagnosis not present

## 2021-01-16 DIAGNOSIS — R2689 Other abnormalities of gait and mobility: Secondary | ICD-10-CM | POA: Diagnosis not present

## 2021-01-16 DIAGNOSIS — M6281 Muscle weakness (generalized): Secondary | ICD-10-CM | POA: Diagnosis not present

## 2021-01-16 DIAGNOSIS — G9341 Metabolic encephalopathy: Secondary | ICD-10-CM | POA: Diagnosis not present

## 2021-01-16 DIAGNOSIS — Z9181 History of falling: Secondary | ICD-10-CM | POA: Diagnosis not present

## 2021-01-16 DIAGNOSIS — R5381 Other malaise: Secondary | ICD-10-CM | POA: Diagnosis not present

## 2021-01-16 DIAGNOSIS — I69891 Dysphagia following other cerebrovascular disease: Secondary | ICD-10-CM | POA: Diagnosis not present

## 2021-01-16 DIAGNOSIS — D696 Thrombocytopenia, unspecified: Secondary | ICD-10-CM | POA: Diagnosis not present

## 2021-01-16 DIAGNOSIS — R41841 Cognitive communication deficit: Secondary | ICD-10-CM | POA: Diagnosis not present

## 2021-01-16 DIAGNOSIS — G40919 Epilepsy, unspecified, intractable, without status epilepticus: Secondary | ICD-10-CM | POA: Diagnosis not present

## 2021-01-16 DIAGNOSIS — F330 Major depressive disorder, recurrent, mild: Secondary | ICD-10-CM | POA: Diagnosis not present

## 2021-01-16 DIAGNOSIS — I10 Essential (primary) hypertension: Secondary | ICD-10-CM | POA: Diagnosis not present

## 2021-01-16 DIAGNOSIS — S022XXD Fracture of nasal bones, subsequent encounter for fracture with routine healing: Secondary | ICD-10-CM | POA: Diagnosis not present

## 2021-01-16 DIAGNOSIS — R1312 Dysphagia, oropharyngeal phase: Secondary | ICD-10-CM | POA: Diagnosis not present

## 2021-01-16 DIAGNOSIS — E039 Hypothyroidism, unspecified: Secondary | ICD-10-CM | POA: Diagnosis not present

## 2021-01-16 MED ORDER — VITAMIN D3 10 MCG (400 UNIT) PO TABS: 400.0000 [IU] | ORAL_TABLET | Freq: Every day | ORAL | 0 refills | Status: AC

## 2021-01-16 NOTE — Progress Notes (Signed)
Occupational Therapy Treatment Patient Details Name: Danielle Bryan MRN: [id number] DOB: 03/28/1923 Today's Date: 01/16/2021   History of present illness Danielle Bryan is a 87 y.o. female presenting to the ED via EMS for evaluation of nose injury/bleed after a mechanical fall on 01/12/21.  Family reported poor appetite and multiple recent falls due to generalized weakness. Patient lives alone.CT maxillofacial showing possible nondisplaced fractures of the nasal bones; all other imaging negative.  Pt has had palliative consult - family wants SNF but would consider hospice if not improving. PHMx: hypertension, hyperlipidemia, hypothyroidism, osteoarthritis, stroke/TIA on Plavix, seizure disorder, GERD, depression.   OT comments  Pt aware she is going to rehab and tearful. She if fearful she cannot participate in therapy. Assured pt therapists will work with her at her own pace. Pt fatigues easily with sitting EOB for grooming and to change gown. Min to mod assist for bed mobility. Declined OOB to chair this visit.   Recommendations for follow up therapy are one component of a multi-disciplinary discharge planning process, led by the attending physician.  Recommendations may be updated based on patient status, additional functional criteria and insurance authorization.    Follow Up Recommendations  Skilled nursing-short term rehab (<3 hours/day)    Assistance Recommended at Discharge Frequent or constant Supervision/Assistance  Equipment Recommendations  None recommended by OT    Recommendations for Other Services      Precautions / Restrictions Precautions Precautions: Fall       Mobility Bed Mobility Overal bed mobility: Needs Assistance Bed Mobility: Rolling;Supine to Sit;Sit to Supine Rolling: Mod assist   Supine to sit: Min assist Sit to supine: Mod assist   General bed mobility comments: rolled as pt wanting to be checked for BM, assist to raise trunk and position hips at EOB,  assist for LEs back into bed, increased time, use of rail and HOB up    Transfers Overall transfer level: Needs assistance                 General transfer comment: pt declined OOB to chair     Balance Overall balance assessment: Needs assistance   Sitting balance-Leahy Scale: Good Sitting balance - Comments: no LOB with B UE use                                   ADL either performed or assessed with clinical judgement   ADL Overall ADL's : Needs assistance/impaired     Grooming: Oral care;Wash/dry hands;Sitting;Minimal assistance Grooming Details (indicate cue type and reason): set up at EOB         Upper Body Dressing : Minimal assistance;Sitting Upper Body Dressing Details (indicate cue type and reason): changed gown after dripping toothpaste                        Extremity/Trunk Assessment              Vision       Perception     Praxis      Cognition Arousal/Alertness: Awake/alert Behavior During Therapy: WFL for tasks assessed/performed Overall Cognitive Status: No family/caregiver present to determine baseline cognitive functioning                         Following Commands: Follows one step commands with increased time     Problem Solving: Requires verbal cues;Requires  tactile cues;Difficulty sequencing General Comments: pt aware she is going to rehab, tearful stating she did not know if she could do it, reassured pt that therapists would encourage her, but within her activity tolerance          Exercises     Shoulder Instructions       General Comments      Pertinent Vitals/ Pain       Pain Assessment: Faces Faces Pain Scale: No hurt  Home Living                                          Prior Functioning/Environment              Frequency  Min 2X/week        Progress Toward Goals  OT Goals(current goals can now be found in the care plan section)  Progress  towards OT goals: Progressing toward goals  Acute Rehab OT Goals OT Goal Formulation: With patient Time For Goal Achievement: 01/27/21 Potential to Achieve Goals: Good  Plan Discharge plan remains appropriate    Co-evaluation                 AM-PAC OT "6 Clicks" Daily Activity     Outcome Measure   Help from another person eating meals?: A Little Help from another person taking care of personal grooming?: A Little Help from another person toileting, which includes using toliet, bedpan, or urinal?: A Lot Help from another person bathing (including washing, rinsing, drying)?: A Lot Help from another person to put on and taking off regular upper body clothing?: A Lot Help from another person to put on and taking off regular lower body clothing?: Total 6 Click Score: 13    End of Session    OT Visit Diagnosis: Unsteadiness on feet (R26.81);Muscle weakness (generalized) (M62.81);Pain   Activity Tolerance Patient limited by fatigue   Patient Left in bed;with call bell/phone within reach   Nurse Communication          Time: 1042-1100 OT Time Calculation (min): 18 min  Charges: OT General Charges $OT Visit: 1 Visit OT Treatments $Self Care/Home Management : 8-22 mins  Nestor Lewandowsky, OTR/L Acute Rehabilitation Services Pager: [phone] Office: [phone]   Danielle Bryan 01/16/2021, 11:18 AM

## 2021-01-16 NOTE — TOC Transition Note (Signed)
Transition of Care [facility]) - CM/SW Discharge Note   Patient Details  Name: Danielle Bryan MRN: [id number] Date of Birth: 14-Nov-1923  [facility] CM/SW Contact:  Tresa Endo Phone Number: 01/16/2021, 12:26 PM   Clinical Narrative:    Patient will DC to: [facility] Anticipated DC date: 01/16/2021 Family notified: Pt son Transport by: Corey Harold   Per MD patient ready for DC to [facility]. RN to call report prior to discharge [phone]). RN, patient, patient's family, and facility notified of DC. Discharge Summary and FL2 sent to facility. DC packet on chart. Ambulance transport requested for patient.   CSW will sign off for now as social work intervention is no longer needed. Please consult [place] again if new needs arise.     Final next level of care: Skilled Nursing Facility Barriers to Discharge: Continued Medical Work up   Patient Goals and CMS Choice Patient states their goals for this hospitalization and ongoing recovery are:: Rehab CMS Medicare.gov Compare Post Acute Care list provided to:: Patient Choice offered to / list presented to : Patient  Discharge Placement                       Discharge Plan and Services In-house Referral: Clinical Social Work   Post Acute Care Choice: [place] [facility]                               Social Determinants of Health (SDOH) Interventions     Readmission Risk Interventions No flowsheet data found.

## 2021-01-16 NOTE — Consult Note (Signed)
[facility] CM Inpatient Consult   01/16/2021  Danielle Bryan 1923-07-20 [id number]  [facility] CM)   Patient is currently active with [facility] CM services for chronic disease management working with RN case manager in outpatient setting. Per review, current recommendation is for skilled nursing facility.   Plan: Will continue to follow for progression and disposition.  Of note, [facility] Care Management services does not replace or interfere with any services that are arranged by inpatient case management or social work.   Netta Cedars, MSN, RN [facility] [employer] [phone]  Toll free office [phone]

## 2021-01-16 NOTE — Discharge Summary (Addendum)
Physician Discharge Summary  [name] CWC:[id number] DOB: [birth year] DOA: 01/12/2021  PCP: Velna Hatchet, MD  Admit date: 01/12/2021 Discharge date: 01/16/2021 30 Day Unplanned Readmission Risk Score    Flowsheet Row ED to Hosp-Admission (Current) from 01/12/2021 in [facility]  30 Day Unplanned Readmission Risk Score (%) 9.65 Filed at 01/16/2021 0801       This score is the patient's risk of an unplanned readmission within 30 days of being discharged (0 -100%). The score is based on dignosis, age, lab data, medications, orders, and past utilization.   Low:  0-14.9   Medium: 15-21.9   High: 22-29.9   Extreme: 30 and above          Admitted From: Home Disposition: SNF  Recommendations for Outpatient Follow-up:  Follow up with PCP in 1-2 weeks Please obtain BMP/CBC in one week Please follow up with your PCP on the following pending results: Unresulted Labs (From admission, onward)    None         Home Health: None Equipment/Devices: None  Discharge Condition: Stable CODE STATUS: DNR Diet recommendation: Cardiac  Subjective: Seen and examined.  She has no complaints.  She is alert and oriented but slow in response.  Brief/Interim Summary: 87 y.o. female with medical history significant of hypertension, hyperlipidemia, hypothyroidism, osteoarthritis, stroke/TIA on Plavix, seizure disorder, GERD, depression presented to the ED via EMS for evaluation of nose injury/bleed after a mechanical fall.  Family reported poor appetite and multiple recent falls due to generalized weakness, family also reported progressive confusion over the last year.  Unfortunately patient is suffering mainly from failure to thrive secondary to her advanced age.  Her inpatient work-up including echocardiogram, carotid ultrasound, CTA chest negative for PE. No obvious evidence of infection or reversible causes have been found.  Her UA is negative, COVID, flu was negative.  TSH/B12  unremarkable.  Echocardiogram shows EF of 75% with grade 1 DD.  PT/OT recommended SNF.  Due to patient's advanced age and comorbidity, palliative care was involved and patient was made DNR.  SNF bed and insurance authorization has been obtained and patient is going to be discharged in stable condition today.   Nondisplaced nasal bone fracture Case discussed with ENT Dr. Janace Hoard by the previous provider-chronic.  No evidence of bleeding.  Stable.  he recommended conservative management, does not need ENT follow-up.  Plavix resumed.   Thrombocytopenia 140 upon presentation and 128 2 days ago.  No signs of bleeding.  Appears to have history of chronic, cytopenia.   Hypertension Controlled.  Resume home medications.   Prior history of CVA Continue Plavix, report take Livalo once a week   Prior history of seizure Take a low dose Depakote at home, Depakote level 25    Hypothyroidism TSH unremarkable, continue Synthroid   Several CTA chest findings: -Small hiatal hernia with increasingly thickened distal thoracic esophagus. Consider endoscopic follow-up., will see how patient eat once her mental status improves -Severe thoracic kyphosis, osteopenia and degenerative change, and chronic appearing compression fractures in lower thoracic spine greatest at T10 -Three-vessel calcific CAD, small pericardial effusion, and moderate to heavy aortic mixed plaques with ulcerative plaque of the descending segment,  blood pressure stable   Pressure injury , presents on admission: Pressure Injury 01/13/21 Heel Left;Right Stage 1 -  Intact skin with non-blanchable redness of a localized area usually over a bony prominence. red, non blanchable (Active)  01/13/21 1508  Location: Heel  Location Orientation: Left;Right  Staging:  Stage 1 -  Intact skin with non-blanchable redness of a localized area usually over a bony prominence.  Wound Description (Comments): red, non blanchable  Present on Admission: Yes         Discharge Diagnoses:  Principal Problem:   Acute metabolic encephalopathy Active Problems:   Hypothyroidism   Dehydration   Fall   Nasal bone fractures   Pressure injury of skin    Discharge Instructions   Allergies as of 01/16/2021       Reactions   Escitalopram Oxalate Nausea Only   Lisinopril Other (See Comments)   Very decreased blood pressure    Statins Other (See Comments)   myalgias   Sulfa Antibiotics Hives        Medication List     STOP taking these medications    CALCIUM CARB-CHOLECALCIFEROL PO   Livalo 2 MG Tabs Generic drug: Pitavastatin Calcium   VITAMIN C PO       TAKE these medications    acetaminophen 500 MG tablet Commonly known as: TYLENOL Take 1,000 mg by mouth See admin instructions. Take 2 tablets (1000 mg) by mouth every morning, may also take 2 tablets (1000 mg) in the evening as needed for headache/pain   amLODipine 5 MG tablet Commonly known as: NORVASC Take 5 mg by mouth every morning.   ASPERCREME EX Apply 1 application topically daily as needed (knee pain).   clopidogrel 75 MG tablet Commonly known as: PLAVIX Take 1 tablet (75 mg total) by mouth daily. What changed: when to take this   divalproex 500 MG 24 hr tablet Commonly known as: DEPAKOTE ER TAKE 1 TABLET BY MOUTH EVERY DAY What changed:  how to take this when to take this   levothyroxine 100 MCG tablet Commonly known as: SYNTHROID TAKE 1 TABLET (100 MCG TOTAL) BY MOUTH DAILY. TAKING 6 DAYS A WEEK. DON'T TAKE ON SUNDAYS What changed: See the new instructions.   pantoprazole 40 MG tablet Commonly known as: PROTONIX Take 40 mg by mouth every evening.   sertraline 50 MG tablet Commonly known as: ZOLOFT Take 1 tablet (50 mg total) by mouth daily.   triamcinolone cream 0.5 % Commonly known as: KENALOG Apply 1 application topically daily as needed (rash/irritation).   Vitamin D3 10 MCG (400 UNIT) tablet Take 1 tablet (400 Units total) by  mouth daily. What changed:  medication strength how much to take when to take this        Follow-up Information     Velna Hatchet, MD Follow up in 1 week(s).   Specialty: Internal Medicine Contact information: [place] [address] [phone]                Allergies  Allergen Reactions   Escitalopram Oxalate Nausea Only   Lisinopril Other (See Comments)    Very decreased blood pressure    Statins Other (See Comments)    myalgias   Sulfa Antibiotics Hives    Consultations: Palliative care   Procedures/Studies: DG Shoulder Right  Result Date: 01/12/2021 CLINICAL DATA:  fall, right shoulder pain EXAM: RIGHT SHOULDER - 2+ VIEW; RIGHT HUMERUS - 2+ VIEW COMPARISON:  None. FINDINGS: There is no evidence of fracture or dislocation. Severe degenerative changes of the right shoulder. Visualized portions of the right elbow grossly unremarkable with limited evaluation due to positioning/nondedicated view. Soft tissues are unremarkable. IMPRESSION: No acute displaced fracture or dislocation of the right humerus or shoulder. Electronically Signed   By: Clelia Croft.D.  On: 01/12/2021 18:55   DG Knee 2 Views Left  Result Date: 01/12/2021 CLINICAL DATA:  Fall EXAM: LEFT KNEE - 1-2 VIEW COMPARISON:  None. FINDINGS: No evidence of acute fracture or malalignment. Trace knee joint effusion, nonspecific. Severe tricompartmental osteoarthritis, most advanced in the lateral compartment. Diffuse bony demineralization. Soft tissue swelling is seen about the knee. Advanced atherosclerotic vascular calcifications. IMPRESSION: 1. No acute fracture or malalignment. 2. Severe tricompartmental osteoarthritis, most advanced in the lateral compartment. Electronically Signed   By: Davina Poke D.O.   On: 01/12/2021 18:59   CT Head Wo Contrast  Result Date: 01/12/2021 CLINICAL DATA:  Found down, unwitnessed fall EXAM: CT HEAD WITHOUT CONTRAST CT MAXILLOFACIAL WITHOUT  CONTRAST CT CERVICAL SPINE WITHOUT CONTRAST TECHNIQUE: Multidetector CT imaging of the head, cervical spine, and maxillofacial structures were performed using the standard protocol without intravenous contrast. Multiplanar CT image reconstructions of the cervical spine and maxillofacial structures were also generated. COMPARISON:  10/26/2020 FINDINGS: CT HEAD FINDINGS Brain: No evidence of acute infarction, hemorrhage, hydrocephalus, extra-axial collection or mass lesion/mass effect. Periventricular and deep white matter hypodensity. Vascular: No hyperdense vessel or unexpected calcification. CT FACIAL BONES FINDINGS Skull: Normal. Negative for fracture or focal lesion. Facial bones: Possible nondisplaced fractures of the nasal bones (series 5, image 60). No other displaced fractures or dislocations. Sinuses/Orbits: No acute finding. Other: None. CT CERVICAL SPINE FINDINGS Alignment: Normal. Skull base and vertebrae: No acute fracture. No primary bone lesion or focal pathologic process. Soft tissues and spinal canal: No prevertebral fluid or swelling. No visible canal hematoma. Disc levels:  Mild disc space height loss and osteophytosis. Upper chest: Negative. Other: None. IMPRESSION: 1. No acute intracranial pathology. Small-vessel white matter disease. 2. Possible nondisplaced fractures of the nasal bones. 3. No other displaced fractures or dislocations of the facial bones. 4. No fracture or static subluxation of the cervical spine. Electronically Signed   By: Delanna Ahmadi M.D.   On: 01/12/2021 13:23   CT Angio Chest Pulmonary Embolism (PE) W or WO Contrast  Result Date: 01/13/2021 CLINICAL DATA:  Suspected pulmonary embolism. Recent fall injury as well. EXAM: CT ANGIOGRAPHY CHEST WITH CONTRAST TECHNIQUE: Multidetector CT imaging of the chest was performed using the standard protocol during bolus administration of intravenous contrast. Multiplanar CT image reconstructions and MIPs were obtained to evaluate  the vascular anatomy. CONTRAST:  62mL ISOVUE-370 IOPAMIDOL (ISOVUE-370) INJECTION 76% COMPARISON:  No prior chest CT for comparison. Last abdomen and pelvis CT is available for comparison, dated 01/11/2005. FINDINGS: Cardiovascular: The heart is slightly enlarged and there is a small pericardial effusion in the superior recess. The coronary arteries are increasingly heavily calcified and there is calcification in the posterior mitral ring. Pulmonary arteries are normal in caliber without evidence of thromboemboli. There are mildly distended superior pulmonary veins. There is no aortic aneurysm or dissection, there is moderate to heavy patchy mixed plaque in the arch and descending segments with moderate descending segment tortuosity and with scattered ulcerative plaque in the descending segment. Mediastinum/Nodes: Heterogeneous 1.2 cm nodule in the left lobe of the thyroid gland and subcentimeter hypodense nodule in the right lobe. There are mildly prominent lymph nodes in the subcarinal mediastinum to the right up to 1.3 cm short axis. There is a small hiatal hernia and a moderately thickened distal thoracic esophagus, mild patulous appearance of the upper to the midthoracic esophagus. Lungs/Pleura: There are bilateral minimal layering pleural effusions. This was seen on prior study as well. There are subpleural chronic reticulated interstitial  changes of lungs at all levels but with a basal predominance, consistent with UIP fibrosis pattern without honeycombing. Lungs are hyperinflated without evidence of nodule or consolidated infiltrate but there may be slight interstitial edema in the extreme lung bases. The trachea and central airways are clear. Upper Abdomen: No acute abnormality. Visualized abdominal aorta, proximal celiac artery and proximal SMA are heavily calcified. Musculoskeletal: There is pronounced thoracic kyphosis. There is osteopenia. There is a severe anterior wedge compression fracture of the T10  vertebral body with chronic appearance, mild wedging of the T8, T9, and T11 vertebrae, without retropulsion and with degenerative changes of the spine and shoulders. There is no displaced rib or sternal fracture or chest wall hematoma. Review of the MIP images confirms the above findings. IMPRESSION: 1. Cardiomegaly with mildly distended superior pulmonary veins and questionable slight interstitial edema in the base of the lungs with minimal pleural effusions. 2. No evidence of pulmonary arterial embolism or arterial dilatation. 3. Three-vessel calcific CAD, small pericardial effusion, and moderate to heavy aortic mixed plaques with ulcerative plaque of the descending segment. 4. Basal predominant subpleural interstitial reticulation. 5. 1.2 cm left thyroid nodule.  No imaging follow-up is required. 6. Small hiatal hernia with increasingly thickened distal thoracic esophagus. Consider endoscopic follow-up. 7. Mildly prominent subcarinal lymph nodes the right but no bulky or encasing adenopathy or further significant lymph nodes. 8. Severe thoracic kyphosis, osteopenia and degenerative change, and chronic appearing compression fractures in lower thoracic spine greatest at T10. This was not seen on the prior abdomen pelvis CT. Electronically Signed   By: Telford Nab M.D.   On: 01/13/2021 02:17   CT Cervical Spine Wo Contrast  Result Date: 01/12/2021 CLINICAL DATA:  Found down, unwitnessed fall EXAM: CT HEAD WITHOUT CONTRAST CT MAXILLOFACIAL WITHOUT CONTRAST CT CERVICAL SPINE WITHOUT CONTRAST TECHNIQUE: Multidetector CT imaging of the head, cervical spine, and maxillofacial structures were performed using the standard protocol without intravenous contrast. Multiplanar CT image reconstructions of the cervical spine and maxillofacial structures were also generated. COMPARISON:  10/26/2020 FINDINGS: CT HEAD FINDINGS Brain: No evidence of acute infarction, hemorrhage, hydrocephalus, extra-axial collection or mass  lesion/mass effect. Periventricular and deep white matter hypodensity. Vascular: No hyperdense vessel or unexpected calcification. CT FACIAL BONES FINDINGS Skull: Normal. Negative for fracture or focal lesion. Facial bones: Possible nondisplaced fractures of the nasal bones (series 5, image 60). No other displaced fractures or dislocations. Sinuses/Orbits: No acute finding. Other: None. CT CERVICAL SPINE FINDINGS Alignment: Normal. Skull base and vertebrae: No acute fracture. No primary bone lesion or focal pathologic process. Soft tissues and spinal canal: No prevertebral fluid or swelling. No visible canal hematoma. Disc levels:  Mild disc space height loss and osteophytosis. Upper chest: Negative. Other: None. IMPRESSION: 1. No acute intracranial pathology. Small-vessel white matter disease. 2. Possible nondisplaced fractures of the nasal bones. 3. No other displaced fractures or dislocations of the facial bones. 4. No fracture or static subluxation of the cervical spine. Electronically Signed   By: Delanna Ahmadi M.D.   On: 01/12/2021 13:23   DG CHEST PORT 1 VIEW  Result Date: 01/13/2021 CLINICAL DATA:  Leukocytosis EXAM: PORTABLE CHEST 1 VIEW COMPARISON:  03/19/2014 FINDINGS: Cardiac shadow is within normal limits. Extrinsic density is noted over the left apex when compared with recent CT examinations. Lungs are clear bilaterally. Patient rotation accentuates the mediastinal markings. Mild aortic calcifications are seen. No bony abnormality is noted. IMPRESSION: No acute abnormality seen. Electronically Signed   By: Linus Mako.D.  On: 01/13/2021 00:05   DG Humerus Right  Result Date: 01/12/2021 CLINICAL DATA:  fall, right shoulder pain EXAM: RIGHT SHOULDER - 2+ VIEW; RIGHT HUMERUS - 2+ VIEW COMPARISON:  None. FINDINGS: There is no evidence of fracture or dislocation. Severe degenerative changes of the right shoulder. Visualized portions of the right elbow grossly unremarkable with limited  evaluation due to positioning/nondedicated view. Soft tissues are unremarkable. IMPRESSION: No acute displaced fracture or dislocation of the right humerus or shoulder. Electronically Signed   By: Iven Finn M.D.   On: 01/12/2021 18:55   EEG adult  Result Date: 01/13/2021 Lora Havens, MD     01/13/2021 11:33 AM Patient Name: Danielle Bryan MRN: [id number] Epilepsy Attending: Lora Havens Referring Physician/Provider: Dr Derrick Ravel Date: 01/13/2021 Duration: 22.33 mins Patient history: 87yo F with h/o epilepsy presented with ams, falls. EEG to evaluate for seizure Level of alertness: Awake AEDs during EEG study: VPA Technical aspects: This EEG study was done with scalp electrodes positioned according to the 10-20 International system of electrode placement. Electrical activity was acquired at a sampling rate of 500Hz  and reviewed with a high frequency filter of 70Hz  and a low frequency filter of 1Hz . EEG data were recorded continuously and digitally stored. Description: The posterior dominant rhythm consists of 8-9 Hz activity of moderate voltage (25-35 uV) seen predominantly in posterior head regions, symmetric and reactive to eye opening and eye closing. EEG showed intermittent generalized, at times sharply contoured  3 to 6 Hz theta-delta slowing. Hyperventilation and photic stimulation were not performed.   ABNORMALITY - Intermittent slow, generalized IMPRESSION: This study is suggestive of mild diffuse encephalopathy, nonspecific etiology. No seizures or epileptiform discharges were seen throughout the recording. Lora Havens   ECHOCARDIOGRAM COMPLETE  Result Date: 01/13/2021    ECHOCARDIOGRAM REPORT   Patient Name:   SHASTINA RUA Date of Exam: 01/13/2021 Medical Rec #:  [id number]    Height:       61.0 in Accession #:    [id number]   Weight:       125.0 lb Date of Birth:  08-Jan-1924    BSA:          1.547 m Patient Age:    87 years     BP:           167/92 mmHg Patient Gender: F             HR:           101 bpm. Exam Location:  Inpatient Procedure: 2D Echo Indications:    syncope  History:        Patient has prior history of Echocardiogram examinations, most                 recent 01/27/2019. Risk Factors:Dyslipidemia and Hypertension.  Sonographer:    Johny Chess RDCS Referring Phys: [id number] [place]  1. Left ventricular ejection fraction, by estimation, is >75%. The left ventricle has hyperdynamic function. The left ventricle has no regional wall motion abnormalities. Left ventricular diastolic parameters are consistent with Grade I diastolic dysfunction (impaired relaxation).  2. Right ventricular systolic function is normal. The right ventricular size is normal. There is normal pulmonary artery systolic pressure.  3. The mitral valve is normal in structure. No evidence of mitral valve regurgitation. No evidence of mitral stenosis. Moderate mitral annular calcification.  4. The aortic valve is tricuspid. Aortic valve regurgitation is not visualized. Aortic valve sclerosis is present, with no evidence  of aortic valve stenosis.  5. The inferior vena cava is normal in size with greater than 50% respiratory variability, suggesting right atrial pressure of 3 mmHg. FINDINGS  Left Ventricle: Left ventricular ejection fraction, by estimation, is >75%. The left ventricle has hyperdynamic function. The left ventricle has no regional wall motion abnormalities. The left ventricular internal cavity size was normal in size. There is no left ventricular hypertrophy. Left ventricular diastolic parameters are consistent with Grade I diastolic dysfunction (impaired relaxation). Right Ventricle: The right ventricular size is normal. Right ventricular systolic function is normal. There is normal pulmonary artery systolic pressure. The tricuspid regurgitant velocity is 2.31 m/s, and with an assumed right atrial pressure of 3 mmHg,  the estimated right ventricular systolic pressure  is 38.2 mmHg. Left Atrium: Left atrial size was normal in size. Right Atrium: Right atrial size was normal in size. Pericardium: There is no evidence of pericardial effusion. Mitral Valve: The mitral valve is normal in structure. Moderate mitral annular calcification. No evidence of mitral valve regurgitation. No evidence of mitral valve stenosis. Tricuspid Valve: The tricuspid valve is normal in structure. Tricuspid valve regurgitation is mild . No evidence of tricuspid stenosis. Aortic Valve: The aortic valve is tricuspid. Aortic valve regurgitation is not visualized. Aortic valve sclerosis is present, with no evidence of aortic valve stenosis. Pulmonic Valve: The pulmonic valve was normal in structure. Pulmonic valve regurgitation is not visualized. No evidence of pulmonic stenosis. Aorta: The aortic root is normal in size and structure. Venous: The inferior vena cava is normal in size with greater than 50% respiratory variability, suggesting right atrial pressure of 3 mmHg. IAS/Shunts: No atrial level shunt detected by color flow Doppler.  LEFT VENTRICLE PLAX 2D LVIDd:         3.50 cm   Diastology LVIDs:         2.20 cm   LV e' medial:    6.09 cm/s LV PW:         1.00 cm   LV E/e' medial:  12.0 LV IVS:        0.90 cm   LV e' lateral:   10.10 cm/s LVOT diam:     1.60 cm   LV E/e' lateral: 7.2 LV SV:         42 LV SV Index:   27 LVOT Area:     2.01 cm  RIGHT VENTRICLE RV S prime:     18.80 cm/s TAPSE (M-mode): 1.4 cm LEFT ATRIUM             Index        RIGHT ATRIUM          Index LA diam:        2.80 cm 1.81 cm/m   RA Area:     9.54 cm LA Vol (A2C):   24.4 ml 15.77 ml/m  RA Volume:   19.40 ml 12.54 ml/m LA Vol (A4C):   28.3 ml 18.30 ml/m LA Biplane Vol: 27.3 ml 17.65 ml/m  AORTIC VALVE LVOT Vmax:   136.00 cm/s LVOT Vmean:  93.600 cm/s LVOT VTI:    0.207 m  AORTA Ao Asc diam: 3.10 cm MITRAL VALVE                TRICUSPID VALVE MV Area (PHT): 4.29 cm     TR Peak grad:   21.3 mmHg MV Decel Time: 177 msec      TR Vmax:        231.00 cm/s MV  E velocity: 72.80 cm/s MV A velocity: 134.00 cm/s  SHUNTS MV E/A ratio:  0.54         Systemic VTI:  0.21 m                             Systemic Diam: 1.60 cm Kirk Ruths MD Electronically signed by Kirk Ruths MD Signature Date/Time: 01/13/2021/1:47:43 PM    Final    VAS US CAROTID  Result Date: 01/13/2021 Carotid Arterial Duplex Study Patient Name:  JYL CHICO  Date of Exam:   01/13/2021 Medical Rec #: [id number]     Accession #:    [id number] Date of Birth: [birth year]     Patient Gender: F Patient Age:   6 years Exam Location:  [facility] Procedure:      VAS US CAROTID Referring Phys: Wandra Feinstein RATHORE --------------------------------------------------------------------------------  Indications:       Syncope and Multiple falls, confusion. Risk Factors:      Hypertension, hyperlipidemia, prior CVA. Comparison Study:  Prior Carotid duplex indicating 1-39% ICA stenosis was done                    03/20/2016 Performing Technologist: Sharion Dove RVS  Examination Guidelines: A complete evaluation includes B-mode imaging, spectral Doppler, color Doppler, and power Doppler as needed of all accessible portions of each vessel. Bilateral testing is considered an integral part of a complete examination. Limited examinations for reoccurring indications may be performed as noted.  Right Carotid Findings: +----------+--------+--------+--------+------------------+------------------+           PSV cm/sEDV cm/sStenosisPlaque DescriptionComments           +----------+--------+--------+--------+------------------+------------------+ CCA Prox  69      10                                intimal thickening +----------+--------+--------+--------+------------------+------------------+ CCA Distal67      8                                 intimal thickening +----------+--------+--------+--------+------------------+------------------+ ICA Prox  62      15               calcific          Shadowing          +----------+--------+--------+--------+------------------+------------------+ ICA Distal72      15                                                   +----------+--------+--------+--------+------------------+------------------+ ECA       116     12                                                   +----------+--------+--------+--------+------------------+------------------+ +----------+--------+-------+--------+-------------------+           PSV cm/sEDV cmsDescribeArm Pressure (mmHG) +----------+--------+-------+--------+-------------------+ [id number]                                         +----------+--------+-------+--------+-------------------+ +---------+--------+--+--------+--+  VertebralPSV cm/s39EDV cm/s11 +---------+--------+--+--------+--+  Left Carotid Findings: +----------+--------+--------+--------+------------------+------------------+           PSV cm/sEDV cm/sStenosisPlaque DescriptionComments           +----------+--------+--------+--------+------------------+------------------+ CCA Prox  61      8                                 intimal thickening +----------+--------+--------+--------+------------------+------------------+ CCA Distal67      10                                intimal thickening +----------+--------+--------+--------+------------------+------------------+ ICA Prox  99      17              calcific                             +----------+--------+--------+--------+------------------+------------------+ ICA Distal73      13                                                   +----------+--------+--------+--------+------------------+------------------+ ECA       263     4               calcific                             +----------+--------+--------+--------+------------------+------------------+ +----------+--------+--------+--------+-------------------+            PSV cm/sEDV cm/sDescribeArm Pressure (mmHG) +----------+--------+--------+--------+-------------------+ [id number]                                          +----------+--------+--------+--------+-------------------+ +---------+--------+--+--------+-+ VertebralPSV cm/s33EDV cm/s6 +---------+--------+--+--------+-+   Summary: Right Carotid: Velocities in the right ICA are consistent with a 1-39% stenosis. Left Carotid: There is no evidence of stenosis in the left ICA. Vertebrals:  Bilateral vertebral arteries demonstrate antegrade flow. Subclavians: Normal flow hemodynamics were seen in bilateral subclavian              arteries. *See table(s) above for measurements and observations.  Electronically signed by Jamelle Haring on 01/13/2021 at 11:58:39 AM.    Final    CT Maxillofacial WO CM  Result Date: 01/12/2021 CLINICAL DATA:  Found down, unwitnessed fall EXAM: CT HEAD WITHOUT CONTRAST CT MAXILLOFACIAL WITHOUT CONTRAST CT CERVICAL SPINE WITHOUT CONTRAST TECHNIQUE: Multidetector CT imaging of the head, cervical spine, and maxillofacial structures were performed using the standard protocol without intravenous contrast. Multiplanar CT image reconstructions of the cervical spine and maxillofacial structures were also generated. COMPARISON:  10/26/2020 FINDINGS: CT HEAD FINDINGS Brain: No evidence of acute infarction, hemorrhage, hydrocephalus, extra-axial collection or mass lesion/mass effect. Periventricular and deep white matter hypodensity. Vascular: No hyperdense vessel or unexpected calcification. CT FACIAL BONES FINDINGS Skull: Normal. Negative for fracture or focal lesion. Facial bones: Possible nondisplaced fractures of the nasal bones (series 5, image 60). No other displaced fractures or dislocations. Sinuses/Orbits: No acute finding. Other: None. CT CERVICAL SPINE FINDINGS Alignment: Normal. Skull base and vertebrae: No acute fracture. No primary bone lesion or focal pathologic process.  Soft tissues and  spinal canal: No prevertebral fluid or swelling. No visible canal hematoma. Disc levels:  Mild disc space height loss and osteophytosis. Upper chest: Negative. Other: None. IMPRESSION: 1. No acute intracranial pathology. Small-vessel white matter disease. 2. Possible nondisplaced fractures of the nasal bones. 3. No other displaced fractures or dislocations of the facial bones. 4. No fracture or static subluxation of the cervical spine. Electronically Signed   By: Delanna Ahmadi M.D.   On: 01/12/2021 13:23     Discharge Exam: Vitals:   01/16/21 0323 01/16/21 1200  BP: (!) 155/69 116/60  Pulse: 83 88  Resp: 16 16  Temp: 98.6 F (37 C) 98.7 F (37.1 C)  SpO2: 96% 96%   Vitals:   01/15/21 1312 01/15/21 1936 01/16/21 0323 01/16/21 1200  BP: (!) 157/74 (!) 142/72 (!) 155/69 116/60  Pulse: 88 98 83 88  Resp:  16 16 16   Temp: 98 F (36.7 C) 99.1 F (37.3 C) 98.6 F (37 C) 98.7 F (37.1 C)  TempSrc: Oral Oral Oral Oral  SpO2: 94% 94% 96% 96%  Weight:   52.6 kg   Height:        General: Pt is alert, awake, not in acute distress Cardiovascular: RRR, S1/S2 +, no rubs, no gallops Respiratory: CTA bilaterally, no wheezing, no rhonchi Abdominal: Soft, NT, ND, bowel sounds + Extremities: no edema, no cyanosis    The results of significant diagnostics from this hospitalization (including imaging, microbiology, ancillary and laboratory) are listed below for reference.     Microbiology: Recent Results (from the past 240 hour(s))  Resp Panel by RT-PCR (Flu A&B, Covid) Nasopharyngeal Swab     Status: None   Collection Time: 01/12/21  7:45 PM   Specimen: Nasopharyngeal Swab; Nasopharyngeal(NP) swabs in vial transport medium  Result Value Ref Range Status   SARS Coronavirus 2 by RT PCR NEGATIVE NEGATIVE Final    Comment: (NOTE) SARS-CoV-2 target nucleic acids are NOT DETECTED.  The SARS-CoV-2 RNA is generally detectable in upper respiratory specimens during the acute  phase of infection. The lowest concentration of SARS-CoV-2 viral copies this assay can detect is 138 copies/mL. A negative result does not preclude SARS-Cov-2 infection and should not be used as the sole basis for treatment or other patient management decisions. A negative result may occur with  improper specimen collection/handling, submission of specimen other than nasopharyngeal swab, presence of viral mutation(s) within the areas targeted by this assay, and inadequate number of viral copies(<138 copies/mL). A negative result must be combined with clinical observations, patient history, and epidemiological information. The expected result is Negative.  Fact Sheet for Patients:  EntrepreneurPulse.com.au  Fact Sheet for Healthcare Providers:  IncredibleEmployment.be  This test is no t yet approved or cleared by the [place] FDA and  has been authorized for detection and/or diagnosis of SARS-CoV-2 by FDA under an Emergency Use Authorization (EUA). This EUA will remain  in effect (meaning this test can be used) for the duration of the COVID-19 declaration under Section 564(b)(1) of the Act, 21 U.S.C.section 360bbb-3(b)(1), unless the authorization is terminated  or revoked sooner.       Influenza A by PCR NEGATIVE NEGATIVE Final   Influenza B by PCR NEGATIVE NEGATIVE Final    Comment: (NOTE) The Xpert Xpress SARS-CoV-2/FLU/RSV plus assay is intended as an aid in the diagnosis of influenza from Nasopharyngeal swab specimens and should not be used as a sole basis for treatment. Nasal washings and aspirates are unacceptable for Xpert Xpress SARS-CoV-2/FLU/RSV testing.  Fact Sheet for Patients: EntrepreneurPulse.com.au  Fact Sheet for Healthcare Providers: IncredibleEmployment.be  This test is not yet approved or cleared by the [place] FDA and has been authorized for detection and/or diagnosis of  SARS-CoV-2 by FDA under an Emergency Use Authorization (EUA). This EUA will remain in effect (meaning this test can be used) for the duration of the COVID-19 declaration under Section 564(b)(1) of the Act, 21 U.S.C. section 360bbb-3(b)(1), unless the authorization is terminated or revoked.  Performed at [facility], [place] [address], [place], [place] [address]   Urine Culture     Status: None   Collection Time: 01/12/21 10:29 PM   Specimen: Urine, Clean Catch  Result Value Ref Range Status   Specimen Description URINE, CLEAN CATCH  Final   Special Requests NONE  Final   Culture   Final    NO GROWTH Performed at [facility], [place] [address] [place], [place] [address]    Report Status 01/14/2021 FINAL  Final  Resp Panel by RT-PCR (Flu A&B, Covid) Nasopharyngeal Swab     Status: None   Collection Time: 01/15/21  3:30 PM   Specimen: Nasopharyngeal Swab; Nasopharyngeal(NP) swabs in vial transport medium  Result Value Ref Range Status   SARS Coronavirus 2 by RT PCR NEGATIVE NEGATIVE Final    Comment: (NOTE) SARS-CoV-2 target nucleic acids are NOT DETECTED.  The SARS-CoV-2 RNA is generally detectable in upper respiratory specimens during the acute phase of infection. The lowest concentration of SARS-CoV-2 viral copies this assay can detect is 138 copies/mL. A negative result does not preclude SARS-Cov-2 infection and should not be used as the sole basis for treatment or other patient management decisions. A negative result may occur with  improper specimen collection/handling, submission of specimen other than nasopharyngeal swab, presence of viral mutation(s) within the areas targeted by this assay, and inadequate number of viral copies(<138 copies/mL). A negative result must be combined with clinical observations, patient history, and epidemiological information. The expected result is Negative.  Fact Sheet for Patients:   EntrepreneurPulse.com.au  Fact Sheet for Healthcare Providers:  IncredibleEmployment.be  This test is no t yet approved or cleared by the [place] FDA and  has been authorized for detection and/or diagnosis of SARS-CoV-2 by FDA under an Emergency Use Authorization (EUA). This EUA will remain  in effect (meaning this test can be used) for the duration of the COVID-19 declaration under Section 564(b)(1) of the Act, 21 U.S.C.section 360bbb-3(b)(1), unless the authorization is terminated  or revoked sooner.       Influenza A by PCR NEGATIVE NEGATIVE Final   Influenza B by PCR NEGATIVE NEGATIVE Final    Comment: (NOTE) The Xpert Xpress SARS-CoV-2/FLU/RSV plus assay is intended as an aid in the diagnosis of influenza from Nasopharyngeal swab specimens and should not be used as a sole basis for treatment. Nasal washings and aspirates are unacceptable for Xpert Xpress SARS-CoV-2/FLU/RSV testing.  Fact Sheet for Patients: EntrepreneurPulse.com.au  Fact Sheet for Healthcare Providers: IncredibleEmployment.be  This test is not yet approved or cleared by the [place] FDA and has been authorized for detection and/or diagnosis of SARS-CoV-2 by FDA under an Emergency Use Authorization (EUA). This EUA will remain in effect (meaning this test can be used) for the duration of the COVID-19 declaration under Section 564(b)(1) of the Act, 21 U.S.C. section 360bbb-3(b)(1), unless the authorization is terminated or revoked.  Performed at [facility], [place] [address] [place], [place] [address]  Labs: BNP (last 3 results) No results for input(s): BNP in the last 8760 hours. Basic Metabolic Panel: Recent Labs  Lab 01/12/21 1740 01/14/21 0208  NA 138 132*  K 3.8 3.5  CL 99 98  CO2 27 26  GLUCOSE 134* 107*  BUN 20 9  CREATININE 0.70 0.49  CALCIUM 9.4 8.3*  MG 2.0 1.8  PHOS 3.0  --    Liver  Function Tests: Recent Labs  Lab 01/12/21 1740  AST 17  ALT 8  ALKPHOS 54  BILITOT 0.9  PROT 6.9  ALBUMIN 3.6   No results for input(s): LIPASE, AMYLASE in the last 168 hours. Recent Labs  Lab 01/13/21 0009  AMMONIA 17   CBC: Recent Labs  Lab 01/12/21 1740 01/13/21 0313 01/14/21 0208  WBC 11.9* 9.9 7.7  NEUTROABS 10.1*  --  6.0  HGB 12.8 11.3* 11.0*  HCT 40.1 35.7* 33.6*  MCV 92.0 92.0 91.1  PLT 140* 123* 128*   Cardiac Enzymes: Recent Labs  Lab 01/12/21 1755  CKTOTAL 34*   BNP: Invalid input(s): POCBNP CBG: Recent Labs  Lab 01/13/21 1216  GLUCAP 95   D-Dimer No results for input(s): DDIMER in the last 72 hours. Hgb A1c No results for input(s): HGBA1C in the last 72 hours. Lipid Profile No results for input(s): CHOL, HDL, LDLCALC, TRIG, CHOLHDL, LDLDIRECT in the last 72 hours. Thyroid function studies No results for input(s): TSH, T4TOTAL, T3FREE, THYROIDAB in the last 72 hours.  Invalid input(s): FREET3 Anemia work up No results for input(s): VITAMINB12, FOLATE, FERRITIN, TIBC, IRON, RETICCTPCT in the last 72 hours. Urinalysis    Component Value Date/Time   COLORURINE YELLOW 01/12/2021 2239   APPEARANCEUR CLEAR 01/12/2021 2239   LABSPEC 1.024 01/12/2021 2239   PHURINE 5.0 01/12/2021 2239   GLUCOSEU NEGATIVE 01/12/2021 2239   HGBUR NEGATIVE 01/12/2021 2239   BILIRUBINUR NEGATIVE 01/12/2021 2239   KETONESUR 20 (A) 01/12/2021 2239   PROTEINUR 100 (A) 01/12/2021 2239   UROBILINOGEN 0.2 03/21/2014 1209   NITRITE NEGATIVE 01/12/2021 2239   LEUKOCYTESUR NEGATIVE 01/12/2021 2239   Sepsis Labs Invalid input(s): PROCALCITONIN,  WBC,  LACTICIDVEN Microbiology Recent Results (from the past 240 hour(s))  Resp Panel by RT-PCR (Flu A&B, Covid) Nasopharyngeal Swab     Status: None   Collection Time: 01/12/21  7:45 PM   Specimen: Nasopharyngeal Swab; Nasopharyngeal(NP) swabs in vial transport medium  Result Value Ref Range Status   SARS Coronavirus 2 by  RT PCR NEGATIVE NEGATIVE Final    Comment: (NOTE) SARS-CoV-2 target nucleic acids are NOT DETECTED.  The SARS-CoV-2 RNA is generally detectable in upper respiratory specimens during the acute phase of infection. The lowest concentration of SARS-CoV-2 viral copies this assay can detect is 138 copies/mL. A negative result does not preclude SARS-Cov-2 infection and should not be used as the sole basis for treatment or other patient management decisions. A negative result may occur with  improper specimen collection/handling, submission of specimen other than nasopharyngeal swab, presence of viral mutation(s) within the areas targeted by this assay, and inadequate number of viral copies(<138 copies/mL). A negative result must be combined with clinical observations, patient history, and epidemiological information. The expected result is Negative.  Fact Sheet for Patients:  EntrepreneurPulse.com.au  Fact Sheet for Healthcare Providers:  IncredibleEmployment.be  This test is no t yet approved or cleared by the [place] FDA and  has been authorized for detection and/or diagnosis of SARS-CoV-2 by FDA under an Emergency Use Authorization (EUA). This EUA will  remain  in effect (meaning this test can be used) for the duration of the COVID-19 declaration under Section 564(b)(1) of the Act, 21 U.S.C.section 360bbb-3(b)(1), unless the authorization is terminated  or revoked sooner.       Influenza A by PCR NEGATIVE NEGATIVE Final   Influenza B by PCR NEGATIVE NEGATIVE Final    Comment: (NOTE) The Xpert Xpress SARS-CoV-2/FLU/RSV plus assay is intended as an aid in the diagnosis of influenza from Nasopharyngeal swab specimens and should not be used as a sole basis for treatment. Nasal washings and aspirates are unacceptable for Xpert Xpress SARS-CoV-2/FLU/RSV testing.  Fact Sheet for Patients: EntrepreneurPulse.com.au  Fact Sheet  for Healthcare Providers: IncredibleEmployment.be  This test is not yet approved or cleared by the [place] FDA and has been authorized for detection and/or diagnosis of SARS-CoV-2 by FDA under an Emergency Use Authorization (EUA). This EUA will remain in effect (meaning this test can be used) for the duration of the COVID-19 declaration under Section 564(b)(1) of the Act, 21 U.S.C. section 360bbb-3(b)(1), unless the authorization is terminated or revoked.  Performed at [facility], [place] [address], [place], [place] [address]   Urine Culture     Status: None   Collection Time: 01/12/21 10:29 PM   Specimen: Urine, Clean Catch  Result Value Ref Range Status   Specimen Description URINE, CLEAN CATCH  Final   Special Requests NONE  Final   Culture   Final    NO GROWTH Performed at [facility], [place] [address] [place], [place] [address]    Report Status 01/14/2021 FINAL  Final  Resp Panel by RT-PCR (Flu A&B, Covid) Nasopharyngeal Swab     Status: None   Collection Time: 01/15/21  3:30 PM   Specimen: Nasopharyngeal Swab; Nasopharyngeal(NP) swabs in vial transport medium  Result Value Ref Range Status   SARS Coronavirus 2 by RT PCR NEGATIVE NEGATIVE Final    Comment: (NOTE) SARS-CoV-2 target nucleic acids are NOT DETECTED.  The SARS-CoV-2 RNA is generally detectable in upper respiratory specimens during the acute phase of infection. The lowest concentration of SARS-CoV-2 viral copies this assay can detect is 138 copies/mL. A negative result does not preclude SARS-Cov-2 infection and should not be used as the sole basis for treatment or other patient management decisions. A negative result may occur with  improper specimen collection/handling, submission of specimen other than nasopharyngeal swab, presence of viral mutation(s) within the areas targeted by this assay, and inadequate number of viral copies(<138 copies/mL). A negative result must be  combined with clinical observations, patient history, and epidemiological information. The expected result is Negative.  Fact Sheet for Patients:  EntrepreneurPulse.com.au  Fact Sheet for Healthcare Providers:  IncredibleEmployment.be  This test is no t yet approved or cleared by the [place] FDA and  has been authorized for detection and/or diagnosis of SARS-CoV-2 by FDA under an Emergency Use Authorization (EUA). This EUA will remain  in effect (meaning this test can be used) for the duration of the COVID-19 declaration under Section 564(b)(1) of the Act, 21 U.S.C.section 360bbb-3(b)(1), unless the authorization is terminated  or revoked sooner.       Influenza A by PCR NEGATIVE NEGATIVE Final   Influenza B by PCR NEGATIVE NEGATIVE Final    Comment: (NOTE) The Xpert Xpress SARS-CoV-2/FLU/RSV plus assay is intended as an aid in the diagnosis of influenza from Nasopharyngeal swab specimens and should not be used as a sole basis for treatment. Nasal washings and  aspirates are unacceptable for Xpert Xpress SARS-CoV-2/FLU/RSV testing.  Fact Sheet for Patients: EntrepreneurPulse.com.au  Fact Sheet for Healthcare Providers: IncredibleEmployment.be  This test is not yet approved or cleared by the [place] FDA and has been authorized for detection and/or diagnosis of SARS-CoV-2 by FDA under an Emergency Use Authorization (EUA). This EUA will remain in effect (meaning this test can be used) for the duration of the COVID-19 declaration under Section 564(b)(1) of the Act, 21 U.S.C. section 360bbb-3(b)(1), unless the authorization is terminated or revoked.  Performed at [facility], [place] [address] [place], [place] [address]      Time coordinating discharge: Over 30 minutes  SIGNED:   Darliss Cheney, MD  [facility] 01/16/2021, 2:34 PM  If 7PM-7AM, please contact  night-coverage [email or web address]

## 2021-01-21 ENCOUNTER — Other Ambulatory Visit: Payer: Self-pay

## 2021-01-21 DIAGNOSIS — D696 Thrombocytopenia, unspecified: Secondary | ICD-10-CM | POA: Diagnosis not present

## 2021-01-21 DIAGNOSIS — R5381 Other malaise: Secondary | ICD-10-CM | POA: Diagnosis not present

## 2021-01-21 DIAGNOSIS — G40919 Epilepsy, unspecified, intractable, without status epilepticus: Secondary | ICD-10-CM | POA: Diagnosis not present

## 2021-01-21 DIAGNOSIS — S022XXD Fracture of nasal bones, subsequent encounter for fracture with routine healing: Secondary | ICD-10-CM | POA: Diagnosis not present

## 2021-01-21 NOTE — Patient Outreach (Signed)
[facility]) Care Management  01/21/2021  BRYNNLIE UNTERREINER [birth year] [id number]   Case Closure     RN CM received notification from [facility] hospital liaison that patient discharged from hospital to SNF.     Plan: RN CM will close case at this time.   Enzo Montgomery, RN,BSN,CCM [place] [facility] Telephonic Care Management Coordinator Direct Phone: [phone] Toll Free: [phone] Fax: [phone]

## 2021-01-24 ENCOUNTER — Ambulatory Visit: Payer: Medicare HMO | Admitting: Podiatry

## 2021-02-01 DIAGNOSIS — R5381 Other malaise: Secondary | ICD-10-CM | POA: Diagnosis not present

## 2021-02-01 DIAGNOSIS — E039 Hypothyroidism, unspecified: Secondary | ICD-10-CM | POA: Diagnosis not present

## 2021-02-01 DIAGNOSIS — I10 Essential (primary) hypertension: Secondary | ICD-10-CM | POA: Diagnosis not present

## 2021-02-01 DIAGNOSIS — S022XXD Fracture of nasal bones, subsequent encounter for fracture with routine healing: Secondary | ICD-10-CM | POA: Diagnosis not present

## 2021-02-04 DIAGNOSIS — F330 Major depressive disorder, recurrent, mild: Secondary | ICD-10-CM | POA: Diagnosis not present

## 2021-02-04 DIAGNOSIS — S022XXD Fracture of nasal bones, subsequent encounter for fracture with routine healing: Secondary | ICD-10-CM | POA: Diagnosis not present

## 2021-02-04 DIAGNOSIS — D696 Thrombocytopenia, unspecified: Secondary | ICD-10-CM | POA: Diagnosis not present

## 2021-02-04 DIAGNOSIS — M170 Bilateral primary osteoarthritis of knee: Secondary | ICD-10-CM | POA: Diagnosis not present

## 2021-02-04 DIAGNOSIS — G40919 Epilepsy, unspecified, intractable, without status epilepticus: Secondary | ICD-10-CM | POA: Diagnosis not present

## 2021-02-12 DIAGNOSIS — E039 Hypothyroidism, unspecified: Secondary | ICD-10-CM | POA: Diagnosis not present

## 2021-02-12 DIAGNOSIS — F339 Major depressive disorder, recurrent, unspecified: Secondary | ICD-10-CM | POA: Diagnosis not present

## 2021-02-12 DIAGNOSIS — I10 Essential (primary) hypertension: Secondary | ICD-10-CM | POA: Diagnosis not present

## 2021-02-12 DIAGNOSIS — E559 Vitamin D deficiency, unspecified: Secondary | ICD-10-CM | POA: Diagnosis not present

## 2021-02-12 DIAGNOSIS — E782 Mixed hyperlipidemia: Secondary | ICD-10-CM | POA: Diagnosis not present

## 2021-02-12 DIAGNOSIS — K219 Gastro-esophageal reflux disease without esophagitis: Secondary | ICD-10-CM | POA: Diagnosis not present

## 2021-02-19 DIAGNOSIS — I10 Essential (primary) hypertension: Secondary | ICD-10-CM | POA: Diagnosis not present

## 2021-02-19 DIAGNOSIS — E785 Hyperlipidemia, unspecified: Secondary | ICD-10-CM | POA: Diagnosis not present

## 2021-02-19 DIAGNOSIS — E039 Hypothyroidism, unspecified: Secondary | ICD-10-CM | POA: Diagnosis not present

## 2021-02-19 DIAGNOSIS — Z79899 Other long term (current) drug therapy: Secondary | ICD-10-CM | POA: Diagnosis not present

## 2021-02-20 DIAGNOSIS — G40919 Epilepsy, unspecified, intractable, without status epilepticus: Secondary | ICD-10-CM | POA: Diagnosis not present

## 2021-02-20 DIAGNOSIS — S022XXD Fracture of nasal bones, subsequent encounter for fracture with routine healing: Secondary | ICD-10-CM | POA: Diagnosis not present

## 2021-02-20 DIAGNOSIS — D696 Thrombocytopenia, unspecified: Secondary | ICD-10-CM | POA: Diagnosis not present

## 2021-02-20 DIAGNOSIS — F330 Major depressive disorder, recurrent, mild: Secondary | ICD-10-CM | POA: Diagnosis not present

## 2021-02-20 DIAGNOSIS — M1990 Unspecified osteoarthritis, unspecified site: Secondary | ICD-10-CM | POA: Diagnosis not present

## 2021-02-20 DIAGNOSIS — I119 Hypertensive heart disease without heart failure: Secondary | ICD-10-CM | POA: Diagnosis not present

## 2021-02-20 DIAGNOSIS — H9193 Unspecified hearing loss, bilateral: Secondary | ICD-10-CM | POA: Diagnosis not present

## 2021-02-20 DIAGNOSIS — G9341 Metabolic encephalopathy: Secondary | ICD-10-CM | POA: Diagnosis not present

## 2021-02-20 DIAGNOSIS — K219 Gastro-esophageal reflux disease without esophagitis: Secondary | ICD-10-CM | POA: Diagnosis not present

## 2021-03-01 ENCOUNTER — Ambulatory Visit: Payer: Self-pay

## 2021-03-04 DIAGNOSIS — F4321 Adjustment disorder with depressed mood: Secondary | ICD-10-CM | POA: Diagnosis not present

## 2021-03-09 DIAGNOSIS — M19011 Primary osteoarthritis, right shoulder: Secondary | ICD-10-CM | POA: Diagnosis not present

## 2021-03-09 DIAGNOSIS — M19012 Primary osteoarthritis, left shoulder: Secondary | ICD-10-CM | POA: Diagnosis not present

## 2021-03-12 DIAGNOSIS — L570 Actinic keratosis: Secondary | ICD-10-CM | POA: Diagnosis not present

## 2021-03-12 DIAGNOSIS — Z85828 Personal history of other malignant neoplasm of skin: Secondary | ICD-10-CM | POA: Diagnosis not present

## 2021-03-22 ENCOUNTER — Encounter (HOSPITAL_COMMUNITY): Payer: Self-pay | Admitting: Emergency Medicine

## 2021-03-22 ENCOUNTER — Inpatient Hospital Stay (HOSPITAL_COMMUNITY)
Admission: EM | Admit: 2021-03-22 | Discharge: 2021-03-29 | DRG: 871 | Disposition: A | Discharge status: Skilled Nursing Facility | Payer: Medicare HMO | Source: Skilled Nursing Facility | Attending: Internal Medicine | Admitting: Internal Medicine

## 2021-03-22 ENCOUNTER — Other Ambulatory Visit: Payer: Self-pay

## 2021-03-22 ENCOUNTER — Emergency Department (HOSPITAL_COMMUNITY): Payer: Medicare HMO

## 2021-03-22 DIAGNOSIS — I10 Essential (primary) hypertension: Secondary | ICD-10-CM | POA: Diagnosis not present

## 2021-03-22 DIAGNOSIS — Z882 Allergy status to sulfonamides status: Secondary | ICD-10-CM | POA: Diagnosis not present

## 2021-03-22 DIAGNOSIS — Z8616 Personal history of COVID-19: Secondary | ICD-10-CM | POA: Diagnosis not present

## 2021-03-22 DIAGNOSIS — A419 Sepsis, unspecified organism: Secondary | ICD-10-CM | POA: Diagnosis not present

## 2021-03-22 DIAGNOSIS — G9341 Metabolic encephalopathy: Secondary | ICD-10-CM | POA: Diagnosis not present

## 2021-03-22 DIAGNOSIS — K219 Gastro-esophageal reflux disease without esophagitis: Secondary | ICD-10-CM | POA: Diagnosis present

## 2021-03-22 DIAGNOSIS — Z515 Encounter for palliative care: Secondary | ICD-10-CM | POA: Diagnosis not present

## 2021-03-22 DIAGNOSIS — E039 Hypothyroidism, unspecified: Secondary | ICD-10-CM | POA: Diagnosis not present

## 2021-03-22 DIAGNOSIS — R6520 Severe sepsis without septic shock: Secondary | ICD-10-CM | POA: Diagnosis present

## 2021-03-22 DIAGNOSIS — Z79890 Hormone replacement therapy (postmenopausal): Secondary | ICD-10-CM | POA: Diagnosis not present

## 2021-03-22 DIAGNOSIS — I69891 Dysphagia following other cerebrovascular disease: Secondary | ICD-10-CM | POA: Diagnosis not present

## 2021-03-22 DIAGNOSIS — R509 Fever, unspecified: Secondary | ICD-10-CM | POA: Diagnosis not present

## 2021-03-22 DIAGNOSIS — F32A Depression, unspecified: Secondary | ICD-10-CM | POA: Diagnosis present

## 2021-03-22 DIAGNOSIS — Z888 Allergy status to other drugs, medicaments and biological substances status: Secondary | ICD-10-CM

## 2021-03-22 DIAGNOSIS — I110 Hypertensive heart disease with heart failure: Secondary | ICD-10-CM | POA: Diagnosis present

## 2021-03-22 DIAGNOSIS — E871 Hypo-osmolality and hyponatremia: Secondary | ICD-10-CM | POA: Diagnosis not present

## 2021-03-22 DIAGNOSIS — Z8249 Family history of ischemic heart disease and other diseases of the circulatory system: Secondary | ICD-10-CM

## 2021-03-22 DIAGNOSIS — Z7902 Long term (current) use of antithrombotics/antiplatelets: Secondary | ICD-10-CM | POA: Diagnosis not present

## 2021-03-22 DIAGNOSIS — I5032 Chronic diastolic (congestive) heart failure: Secondary | ICD-10-CM | POA: Diagnosis not present

## 2021-03-22 DIAGNOSIS — G40909 Epilepsy, unspecified, not intractable, without status epilepticus: Secondary | ICD-10-CM | POA: Diagnosis present

## 2021-03-22 DIAGNOSIS — I700 Atherosclerosis of aorta: Secondary | ICD-10-CM | POA: Diagnosis not present

## 2021-03-22 DIAGNOSIS — R000 Tachycardia, unspecified: Secondary | ICD-10-CM | POA: Diagnosis not present

## 2021-03-22 DIAGNOSIS — E7800 Pure hypercholesterolemia, unspecified: Secondary | ICD-10-CM | POA: Diagnosis present

## 2021-03-22 DIAGNOSIS — M2550 Pain in unspecified joint: Secondary | ICD-10-CM | POA: Diagnosis not present

## 2021-03-22 DIAGNOSIS — I69398 Other sequelae of cerebral infarction: Secondary | ICD-10-CM | POA: Diagnosis not present

## 2021-03-22 DIAGNOSIS — M40204 Unspecified kyphosis, thoracic region: Secondary | ICD-10-CM | POA: Diagnosis not present

## 2021-03-22 DIAGNOSIS — R4182 Altered mental status, unspecified: Secondary | ICD-10-CM

## 2021-03-22 DIAGNOSIS — M1990 Unspecified osteoarthritis, unspecified site: Secondary | ICD-10-CM | POA: Diagnosis present

## 2021-03-22 DIAGNOSIS — U071 COVID-19: Secondary | ICD-10-CM | POA: Diagnosis not present

## 2021-03-22 DIAGNOSIS — R1312 Dysphagia, oropharyngeal phase: Secondary | ICD-10-CM | POA: Diagnosis not present

## 2021-03-22 DIAGNOSIS — Z20822 Contact with and (suspected) exposure to covid-19: Secondary | ICD-10-CM | POA: Diagnosis present

## 2021-03-22 DIAGNOSIS — R2681 Unsteadiness on feet: Secondary | ICD-10-CM | POA: Diagnosis not present

## 2021-03-22 DIAGNOSIS — J189 Pneumonia, unspecified organism: Secondary | ICD-10-CM | POA: Diagnosis not present

## 2021-03-22 DIAGNOSIS — Z66 Do not resuscitate: Secondary | ICD-10-CM | POA: Diagnosis present

## 2021-03-22 DIAGNOSIS — Z8673 Personal history of transient ischemic attack (TIA), and cerebral infarction without residual deficits: Secondary | ICD-10-CM

## 2021-03-22 DIAGNOSIS — Z9181 History of falling: Secondary | ICD-10-CM | POA: Diagnosis not present

## 2021-03-22 DIAGNOSIS — R54 Age-related physical debility: Secondary | ICD-10-CM | POA: Diagnosis present

## 2021-03-22 DIAGNOSIS — Z7189 Other specified counseling: Secondary | ICD-10-CM | POA: Diagnosis not present

## 2021-03-22 DIAGNOSIS — J1282 Pneumonia due to coronavirus disease 2019: Secondary | ICD-10-CM | POA: Diagnosis not present

## 2021-03-22 DIAGNOSIS — R41841 Cognitive communication deficit: Secondary | ICD-10-CM | POA: Diagnosis not present

## 2021-03-22 DIAGNOSIS — I69828 Other speech and language deficits following other cerebrovascular disease: Secondary | ICD-10-CM | POA: Diagnosis not present

## 2021-03-22 DIAGNOSIS — R918 Other nonspecific abnormal finding of lung field: Secondary | ICD-10-CM | POA: Diagnosis not present

## 2021-03-22 DIAGNOSIS — Z79899 Other long term (current) drug therapy: Secondary | ICD-10-CM | POA: Diagnosis not present

## 2021-03-22 DIAGNOSIS — Z7401 Bed confinement status: Secondary | ICD-10-CM | POA: Diagnosis not present

## 2021-03-22 LAB — COMPREHENSIVE METABOLIC PANEL
ALT: 9 U/L (ref 0–44)
AST: 15 U/L (ref 15–41)
Albumin: 3.5 g/dL (ref 3.5–5.0)
Alkaline Phosphatase: 73 U/L (ref 38–126)
Anion gap: 12 (ref 5–15)
BUN: 11 mg/dL (ref 8–23)
CO2: 25 mmol/L (ref 22–32)
Calcium: 9.2 mg/dL (ref 8.9–10.3)
Chloride: 96 mmol/L — ABNORMAL LOW (ref 98–111)
Creatinine, Ser: 0.64 mg/dL (ref 0.44–1.00)
GFR, Estimated: >60 mL/min (ref >60)
Glucose, Bld: 110 mg/dL — ABNORMAL HIGH (ref 70–99)
Potassium: 3.8 mmol/L (ref 3.5–5.1)
Sodium: 133 mmol/L — ABNORMAL LOW (ref 135–145)
Total Bilirubin: 1 mg/dL (ref 0.3–1.2)
Total Protein: 6.7 g/dL (ref 6.5–8.1)

## 2021-03-22 LAB — RESP PANEL BY RT-PCR (FLU A&B, COVID) ARPGX2
Influenza A by PCR: NEGATIVE
Influenza B by PCR: NEGATIVE
SARS Coronavirus 2 by RT PCR: POSITIVE — AB

## 2021-03-22 LAB — CBC WITH DIFFERENTIAL/PLATELET
Abs Immature Granulocytes: 0.17 10*3/uL — ABNORMAL HIGH (ref 0.00–0.07)
Basophils Absolute: 0 10*3/uL (ref 0.0–0.1)
Basophils Relative: 0 %
Eosinophils Absolute: 0 10*3/uL (ref 0.0–0.5)
Eosinophils Relative: 0 %
HCT: 38.5 % (ref 36.0–46.0)
Hemoglobin: 12.7 g/dL (ref 12.0–15.0)
Immature Granulocytes: 1 %
Lymphocytes Relative: 13 %
Lymphs Abs: 2.5 10*3/uL (ref 0.7–4.0)
MCH: 29.3 pg (ref 26.0–34.0)
MCHC: 33 g/dL (ref 30.0–36.0)
MCV: 88.7 fL (ref 80.0–100.0)
Monocytes Absolute: 2 10*3/uL — ABNORMAL HIGH (ref 0.1–1.0)
Monocytes Relative: 10 %
Neutro Abs: 15.5 10*3/uL — ABNORMAL HIGH (ref 1.7–7.7)
Neutrophils Relative %: 76 %
Platelets: 161 10*3/uL (ref 150–400)
RBC: 4.34 MIL/uL (ref 3.87–5.11)
RDW: 14 % (ref 11.5–15.5)
WBC: 20.2 10*3/uL — ABNORMAL HIGH (ref 4.0–10.5)
nRBC: 0 % (ref 0.0–0.2)

## 2021-03-22 LAB — CK: Total CK: 34 U/L — ABNORMAL LOW (ref 38–234)

## 2021-03-22 LAB — MAGNESIUM: Magnesium: 1.8 mg/dL (ref 1.7–2.4)

## 2021-03-22 LAB — CBG MONITORING, ED: Glucose-Capillary: 123 mg/dL — ABNORMAL HIGH (ref 70–99)

## 2021-03-22 LAB — BRAIN NATRIURETIC PEPTIDE: B Natriuretic Peptide: 73.7 pg/mL (ref 0.0–100.0)

## 2021-03-22 LAB — PROCALCITONIN: Procalcitonin: 0.12 ng/mL

## 2021-03-22 LAB — LACTIC ACID, PLASMA
Lactic Acid, Venous: 1.1 mmol/L (ref 0.5–1.9)
Lactic Acid, Venous: 1 mmol/L (ref 0.5–1.9)

## 2021-03-22 LAB — VALPROIC ACID LEVEL: Valproic Acid Lvl: 50 ug/mL (ref 50.0–100.0)

## 2021-03-22 LAB — TSH: TSH: 3.416 u[IU]/mL (ref 0.350–4.500)

## 2021-03-22 MED ORDER — LEVOTHYROXINE SODIUM 25 MCG PO TABS
50.0000 ug | ORAL_TABLET | ORAL | Status: DC
  Administered 2021-03-23: 50 ug via ORAL
  Filled 2021-03-22: qty 2

## 2021-03-22 MED ORDER — SODIUM CHLORIDE 0.9 % IV SOLN
1.0000 g | Freq: Once | INTRAVENOUS | Status: AC
  Administered 2021-03-22: 1 g via INTRAVENOUS
  Filled 2021-03-22: qty 10

## 2021-03-22 MED ORDER — ACETAMINOPHEN 650 MG RE SUPP: 650.0000 mg | Freq: Four times a day (QID) | RECTAL | Status: DC | PRN

## 2021-03-22 MED ORDER — PANTOPRAZOLE SODIUM 40 MG PO TBEC
40.0000 mg | DELAYED_RELEASE_TABLET | Freq: Every evening | ORAL | Status: DC
  Administered 2021-03-23: 40 mg via ORAL
  Filled 2021-03-22: qty 1

## 2021-03-22 MED ORDER — SODIUM CHLORIDE 0.9 % IV SOLN
500.0000 mg | Freq: Once | INTRAVENOUS | Status: AC
  Administered 2021-03-22: 500 mg via INTRAVENOUS
  Filled 2021-03-22: qty 5

## 2021-03-22 MED ORDER — ACETAMINOPHEN 325 MG PO TABS
650.0000 mg | ORAL_TABLET | Freq: Four times a day (QID) | ORAL | Status: DC | PRN
  Administered 2021-03-22 – 2021-03-23 (×3): 650 mg via ORAL
  Filled 2021-03-22 (×3): qty 2

## 2021-03-22 MED ORDER — VANCOMYCIN HCL IN DEXTROSE 1-5 GM/200ML-% IV SOLN
1000.0000 mg | Freq: Once | INTRAVENOUS | Status: AC
  Administered 2021-03-23: 1000 mg via INTRAVENOUS
  Filled 2021-03-22: qty 200

## 2021-03-22 MED ORDER — LACTATED RINGERS IV SOLN: INTRAVENOUS | Status: AC

## 2021-03-22 MED ORDER — CLOPIDOGREL BISULFATE 75 MG PO TABS
75.0000 mg | ORAL_TABLET | Freq: Every morning | ORAL | Status: DC
  Administered 2021-03-23: 75 mg via ORAL
  Filled 2021-03-22: qty 1

## 2021-03-22 MED ORDER — VANCOMYCIN HCL 750 MG/150ML IV SOLN: 750.0000 mg | INTRAVENOUS | Status: DC

## 2021-03-22 MED ORDER — AMLODIPINE BESYLATE 5 MG PO TABS
5.0000 mg | ORAL_TABLET | Freq: Every morning | ORAL | Status: DC
  Administered 2021-03-23: 5 mg via ORAL
  Filled 2021-03-22: qty 1

## 2021-03-22 MED ORDER — DIVALPROEX SODIUM ER 500 MG PO TB24
500.0000 mg | ORAL_TABLET | Freq: Every morning | ORAL | Status: DC
  Administered 2021-03-23 – 2021-03-24 (×2): 500 mg via ORAL
  Filled 2021-03-22 (×3): qty 1

## 2021-03-22 MED ORDER — SODIUM CHLORIDE 0.9 % IV SOLN
500.0000 mg | INTRAVENOUS | Status: DC
  Administered 2021-03-23 – 2021-03-26 (×4): 500 mg via INTRAVENOUS
  Filled 2021-03-22 (×4): qty 5

## 2021-03-22 MED ORDER — LACTATED RINGERS IV BOLUS
1000.0000 mL | Freq: Once | INTRAVENOUS | Status: AC
  Administered 2021-03-22: 1000 mL via INTRAVENOUS

## 2021-03-22 MED ORDER — SODIUM CHLORIDE 0.9 % IV SOLN
1.0000 g | INTRAVENOUS | Status: DC
  Administered 2021-03-23: 1 g via INTRAVENOUS
  Filled 2021-03-22: qty 10

## 2021-03-22 MED ORDER — BENZONATATE 100 MG PO CAPS
200.0000 mg | ORAL_CAPSULE | Freq: Three times a day (TID) | ORAL | Status: DC | PRN
  Administered 2021-03-22 – 2021-03-24 (×3): 200 mg via ORAL
  Filled 2021-03-22 (×4): qty 2

## 2021-03-22 NOTE — ED Triage Notes (Signed)
PT to ER via EMS from [facility], pt tested positive for COVID last week.  Pt is normally able to sit to the side of her bed and carry on conversations.  Today staff noted pt to be less active and is not acting her normal.  Pt had noted congested cough.

## 2021-03-22 NOTE — H&P (Signed)
History and Physical    PLEASE NOTE THAT [employer]AGON [employer] SOFTWARE WAS USED IN THE CONSTRUCTION OF THIS NOTE.   Danielle Bryan:[id number] DOB: May 08, 1923 DOA: 03/22/2021  PCP: Velna Hatchet, MD  Patient coming from: Assisted living facility  I have personally briefly reviewed patient's old medical records in [place]  Chief Complaint: Altered mental status  HPI: Danielle Bryan is a 88 y.o. female with medical history significant for hypertension, acquired hypothyroidism, multiple prior TIAs, recent COVID-19 infection, chronic diastolic heart failure, who is admitted to [facility] on 03/22/2021 with severe sepsis due to community-acquired pneumonia after presenting from assisted living facility to [facility] ED for evaluation of altered mental status.  In the setting of the patient's altered mental status, the history is provided by the patient's son in addition to my discussions with the EDP and via chart review.  Patient recently took a COVID-19 test on 03/05/2021 which is noted to be positive, in the setting of rhinitis/rhinorrhea, which is subsequently entirely resolved, without any residual acute respiratory symptoms.  However, over the last 3 days, patient has developed a new onset productive cough, with his son noting the patient developed confusion relative to her baseline mental status over the last 2 days, ultimately prompting her to present to [facility] emergency department this evening for further evaluation thereof.  Patient denies any associated shortness of breath.  Productive cough has not been associate with any hemoptysis.  Not associate with any subjective fever, chills, rigors, generalized myalgias.  Not associate with any chest pain, palpitations, diaphoresis, nausea, vomiting.  Denies any associated worsening of edema in the bilateral lower extremities.  Denies any dysuria or gross hematuria.  No abdominal pain, diarrhea, rash.  No known chronic underlying pulmonary  conditions.   Of note, the patient was hospitalized at [facility] from 01/12/2021 to 01/16/2021, with review of associated discharge summary revealing primary diagnosis over the course of this prior hospitalization to be nondisplaced nasal bone fracture.  It does not appear that she received any IV antibiotics over the course this previous hospitalization, with son reporting that she has not received any antibiotics in the interval.   Medical history notable for chronic diastolic heart failure, with most recent echocardiogram on 01/13/2021 notable for LVEF greater than 75%, no focal lesion abnormalities, while demonstrating evidence of grade 1 diastolic dysfunction, in the absence of any significant valvular pathology.  Not on any diuretic medications as an outpatient.     ED Course:  Vital signs in the ED were notable for the following: Afebrile; heart rate 110 117; blood pressure 140/56 - 152/73; respiratory rate 21-31; oxygen saturation 100% on room air.  Labs were notable for the following: CMP notable for the following: Sodium 133 compared to most recent prior value 132 on 01/14/2021, bicarbonate 25, creatinine 0.64, glucose 110, liver enzymes found to be within normal limits.  CBC notable for white cell count 20,200 with 76% neutrophils.  TSH 3.416.  Urinalysis ordered, with result currently pending.  But ultrasound circulated prior to initiation of IV antibiotics.  Imaging and additional notable ED work-up: EKG showed sinus tachycardia with heart rate 115, normal intervals, and no evidence of T wave or ST changes, including no evidence of ST elevation.  Chest x-ray, in comparison most recent prior from 01/12/2021 showed interval development of focal airspace opacity in the right upper lobe concerning for pneumonia, in the absence of any edema, effusion, or pneumothorax.  Noncontrast CT head showed no evidence  of acute intracranial process, including no evidence of acute hemorrhage or acute  ischemic infarct.  While in the ED, the following were administered: Azithromycin, Rocephin, 1 L LR bolus.  Subsequently, the patient was admitted for further evaluation management of severe sepsis due to right upper lobe pneumonia, complicated by acute metabolic encephalopathy, with concern for secondary bacterial pneumonia in proximity to recent diagnosis of COVID-19 infection, with interval improvement in her respiratory symptoms, before development of new onset cough with altered mental status over the last 2 - 3 days.     Review of Systems: As per HPI otherwise 10 point review of systems negative.   Past Medical History:  Diagnosis Date   Hypercholesterolemia    Hypertension    Hypothyroidism    Osteoarthritis    Reactive depression (situational)    Stroke ([place])    TIA (transient ischemic attack)    on plavix    Past Surgical History:  Procedure Laterality Date   CATARACT EXTRACTION Left 1992   CATARACT EXTRACTION Right 1995   DEBRIDEMENT AND CLOSURE WOUND      Social History:  reports that she has never smoked. She has never used smokeless tobacco. She reports that she does not drink alcohol and does not use drugs.   Allergies  Allergen Reactions   Escitalopram Oxalate Nausea Only   Celexa [Citalopram]     Other reaction(s): Unknown   Escitalopram Other (See Comments)   Lisinopril Other (See Comments)    Very decreased blood pressure    Statins Other (See Comments)    myalgias   Sulfa Antibiotics Hives and Other (See Comments)    Family History  Problem Relation Age of Onset   Heart failure Mother    Pneumonia Mother    Heart failure Father     Family history reviewed and not pertinent    Prior to Admission medications   Medication Sig Start Date End Date Taking? Authorizing Provider  acetaminophen (TYLENOL) 500 MG tablet Take 1,000 mg by mouth See admin instructions. Take 2 tablets (1000 mg) by mouth every morning, may also take 2 tablets (1000 mg) in  the evening as needed for headache/pain    [provider]  amLODipine (NORVASC) 5 MG tablet Take 5 mg by mouth every morning. 03/18/16   [provider]  clopidogrel (PLAVIX) 75 MG tablet Take 1 tablet (75 mg total) by mouth daily. Patient taking differently: Take 75 mg by mouth every morning. 03/21/14   Velna Hatchet, MD  divalproex (DEPAKOTE ER) 500 MG 24 hr tablet TAKE 1 TABLET BY MOUTH EVERY DAY Patient taking differently: 500 mg every morning. 04/19/20   Frann Rider, NP  levothyroxine (SYNTHROID, LEVOTHROID) 100 MCG tablet TAKE 1 TABLET (100 MCG TOTAL) BY MOUTH DAILY. TAKING 6 DAYS A WEEK. DON'T TAKE ON SUNDAYS Patient taking differently: Take 50 mcg by mouth See admin instructions. Take one tablet (50 mcg) by mouth 6 days weekly in the morning, skip Sundays    Darlin Coco, MD  pantoprazole (PROTONIX) 40 MG tablet Take 40 mg by mouth every evening.    [provider]  sertraline (ZOLOFT) 50 MG tablet Take 1 tablet (50 mg total) by mouth daily. 06/28/20   Frann Rider, NP  triamcinolone cream (KENALOG) 0.5 % Apply 1 application topically daily as needed (rash/irritation).    [provider]  Trolamine Salicylate (ASPERCREME EX) Apply 1 application topically daily as needed (knee pain).    [provider]     Objective  Physical Exam: Vitals:   03/22/21 1730 03/22/21 1915 03/22/21 1945 03/22/21 2000  BP: (!) 173/94 (!) 171/82 (!) 158/73 (!) 161/75  Pulse: (!) 115 (!) 114 (!) 109 (!) 116  Resp: (!) 26 (!) 22 (!) 27 (!) 25  Temp:      SpO2: 100% 100% 99% 99%  Weight:      Height:        General: appears to be stated age; alert; confused Skin: warm, dry, no rash Head:  AT/[place] Mouth:  Oral mucosa membranes appear dry, normal dentition Neck: supple; trachea midline Heart:  RRR; did not appreciate any M/R/G Lungs: CTAB, did not appreciate any wheezes, rales, or rhonchi Abdomen: + BS; soft, ND, NT Vascular: 2+ pedal pulses b/l;  2+ radial pulses b/l Extremities: no peripheral edema, no muscle wasting Neuro: strength and sensation intact in upper and lower extremities b/l   Labs on Admission: I have personally reviewed following labs and imaging studies  CBC: Recent Labs  Lab 03/22/21 1751  WBC 20.2*  NEUTROABS 15.5*  HGB 12.7  HCT 38.5  MCV 88.7  PLT 130   Basic Metabolic Panel: Recent Labs  Lab 03/22/21 1751  NA 133*  K 3.8  CL 96*  CO2 25  GLUCOSE 110*  BUN 11  CREATININE 0.64  CALCIUM 9.2  MG 1.8   GFR: Estimated Creatinine Clearance: 31 mL/min (by C-G formula based on SCr of 0.64 mg/dL). Liver Function Tests: Recent Labs  Lab 03/22/21 1751  AST 15  ALT 9  ALKPHOS 73  BILITOT 1.0  PROT 6.7  ALBUMIN 3.5   No results for input(s): LIPASE, AMYLASE in the last 168 hours. No results for input(s): AMMONIA in the last 168 hours. Coagulation Profile: No results for input(s): INR, PROTIME in the last 168 hours. Cardiac Enzymes: No results for input(s): CKTOTAL, CKMB, CKMBINDEX, TROPONINI in the last 168 hours. BNP (last 3 results) No results for input(s): PROBNP in the last 8760 hours. HbA1C: No results for input(s): HGBA1C in the last 72 hours. CBG: Recent Labs  Lab 03/22/21 1955  GLUCAP 123*   Lipid Profile: No results for input(s): CHOL, HDL, LDLCALC, TRIG, CHOLHDL, LDLDIRECT in the last 72 hours. Thyroid Function Tests: Recent Labs    03/22/21 1751  TSH 3.416   Anemia Panel: No results for input(s): VITAMINB12, FOLATE, FERRITIN, TIBC, IRON, RETICCTPCT in the last 72 hours. Urine analysis:    Component Value Date/Time   COLORURINE YELLOW 01/12/2021 2239   APPEARANCEUR CLEAR 01/12/2021 2239   LABSPEC 1.024 01/12/2021 2239   PHURINE 5.0 01/12/2021 2239   GLUCOSEU NEGATIVE 01/12/2021 2239   HGBUR NEGATIVE 01/12/2021 2239   BILIRUBINUR NEGATIVE 01/12/2021 2239   KETONESUR 20 (A) 01/12/2021 2239   PROTEINUR 100 (A) 01/12/2021 2239   UROBILINOGEN 0.2 03/21/2014 1209    NITRITE NEGATIVE 01/12/2021 2239   LEUKOCYTESUR NEGATIVE 01/12/2021 2239    Radiological Exams on Admission: CT HEAD WO CONTRAST (5MM)  Result Date: 03/22/2021 CLINICAL DATA:  Altered mental status. EXAM: CT HEAD WITHOUT CONTRAST TECHNIQUE: Contiguous axial images were obtained from the base of the skull through the vertex without intravenous contrast. RADIATION DOSE REDUCTION: This exam was performed according to the departmental dose-optimization program which includes automated exposure control, adjustment of the mA and/or kV according to patient size and/or use of iterative reconstruction technique. COMPARISON:  January 12, 2021. FINDINGS: Brain: Mild chronic ischemic white matter disease is noted. No mass effect or midline shift is noted. Ventricular size is within normal  limits. There is no evidence of mass lesion, hemorrhage or acute infarction. Vascular: No hyperdense vessel or unexpected calcification. Skull: Normal. Negative for fracture or focal lesion. Sinuses/Orbits: No acute finding. Other: None. IMPRESSION: No acute intracranial abnormality seen. Electronically Signed   By: Marijo Conception M.D.   On: 03/22/2021 17:19   DG Chest Portable 1 View  Result Date: 03/22/2021 CLINICAL DATA:  concern for pna after recent Covid19 infection EXAM: PORTABLE CHEST 1 VIEW COMPARISON:  01/12/2021 FINDINGS: Heart size within normal limits. Aortic atherosclerosis. New focal opacity within the right upper lobe. Left lung appears clear. No definite pleural fluid collection. No pneumothorax. Exaggerated thoracic kyphosis. IMPRESSION: New focal opacity within the right upper lobe suspicious for pneumonia. Radiographic follow-up to resolution is recommended. Electronically Signed   By: Davina Poke D.O.   On: 03/22/2021 16:38     EKG: Independently reviewed, with result as described above.    Assessment/Plan   Principal Problem:   Acute metabolic encephalopathy Active Problems:    Hypothyroidism   HTN (hypertension)   Severe sepsis ([place])   Community acquired pneumonia   Chronic diastolic CHF (congestive heart failure) (HCC)   GERD (gastroesophageal reflux disease)   Depression     #) Severe sepsis due to community-acquired pneumonia: Diagnosis on the basis of new onset productive cough over the last 3 days, with chest x-ray showing interval development of right upper lobe airspace opacity concerning for infiltrate consistent with pneumonia, which appears new relative to most recent prior chest x-ray from November 2022.  SIRS criteria met via presenting leukocytosis, tachycardia, tachypnea.  Of note, given the associated presence of suspected end organ damage in the form of concominant presenting acute encephalopathy, criteria are met for pt's sepsis to be considered severe in nature.  Will also check stat lactic acid level.  However, in the absence of l current actic acid level that is greater than or equal to 4.0, and in the absence of any associated hypotension refractory to IVF's, there are no indications for administration of a 30 mL/kg IVF bolus at this time.   While the patient was previously hospitalized within the last 3 months, we no longer use the HCAP designation.  Consequently, we will consider presentation to be consistent with community-acquired pneumonia.  However, there is concern for the possibility of secondary bacterial pneumonia given recent COVID-19 infection with initial improvement/complete resolution of the respiratory symptoms that she was experiencing at the time of COVID-19 positive finding on 03/05/2021 prior to new development of productive cough.  In this setting, the patient is at increased risk for MRSA pneumonia.  Consequently, will add IV vancomycin to standard community-acquired pneumonia antibiotic coverage, as further detailed below.  Consistent with this, will check MRSA PCR, which, if negative, can consider discontinuation of IV vancomycin  given the high negative predictive value for MRSA pneumonia associated with such a result.   Of note, patient's pneumonia does not meet criteria to be considered severe pneumonia, in the absence of any major criteria, and with the presence of only 2 minor criteria, namely respiratory rate greater than 30 as well as altered mental status.  Additional ED work-up/management today was notable for: Collection of blood cultures x2 followed by initiation of Rocephin/azithromycin.  Urinalysis ordered, with result currently pending.  Overall, no evidence of additional underlying infectious process at this time, but will follow closely for UA result, as above.    Plan: CBC w/ diff in AM.  Follow for results of blood cx's  x 2. Abx: IV vancomycin, Rocephin, azithromycin, as further detailed above.  Check MRSA PCR, as above.  Procalcitonin.  In the setting of history of chronic diastolic heart failure, gentle LR at 50 cc/h x 8 hours.  Stat lactic acid level.  Sputum culture.  Strep pneumonia urine antigen.  Prn Tessalon Perles.  Flutter valve, incentive spirometry.  Follow-up result of urinalysis. prn acetaminophen for fever.  Monitor on telemetry.           #) Acute metabolic encephalopathy: 2 days of confusion relative to baseline mental status; Patient's acute encephalopathy appears, at least in part, to be metabolic on the basis of physiologic stressors stemming from presenting severe sepsis d/t suspected right upper lobe pneumonia.  No obvious additional contributory underlying infectious process at this time, although urinalysis result is currently pending.  no additional overt metabolic/electrolyte contributions at this time, while noting that her mild hyponatremia is actually slightly improved relative to most recent prior value from November 2022.  While less likely, differential also includes potential for pharmacologic contribution given multiple central acting medications taken on an outpatient  basis, including that of Depakote and Zoloft.  Will check Depakote level to further evaluate, will noting that this level was low when last checked in November 2022.  No overt acute focal neurologic deficits to suggest a contribution from an underlying acute CVA, while CTA head performed this evening showed no evidence of acute intracranial process. Seizures are also felt to be less likely. Will check VBG to evaluate for any contribution from hypercapnic encephalopathy.  TSH within normal limits.  Plan: fall precautions. Repeat CMP/CBC in the AM. Check magnesium level. check VBG, MMA.  Hold outpatient Zoloft for now.  Check Depakote level.  Follow-up result urinalysis.  UDS.  Follow precautions.  Check ammonia level, CPK, MMA.  Further evaluation management presents for sepsis due to right upper lobe pneumonia, as further detailed above.       #) Recent COVID-19 infection: Had positive COVID-19 test as a new patient on 03/05/2021, with complete interval resolution of the rhinitis/rhinorrhea that she experienced that time, prior to development of new onset productive cough over the last few days, raising possibility of secondary bacterial pneumonia in the) 74 recent COVID-19 infection.  Given duration of elapsed time following.  Positive COVID-19 test with complete interval resolution of respiratory symptoms, no negation for continuation of airborne/contact cautions at this time.  Rather, will pursue supportive measures while addressing further evaluation for potential secondary bacterial pneumonia, as detailed above.  Plan: Repeat CBC in the morning.  Flutter valve.  NSAIDs [place].         #) Essential Hypertension: documented h/o such, with outpatient antihypertensive regimen including amlodipine.  SBP's in the ED today: 140s to 150s mmHg.   Plan: Close monitoring of subsequent BP via routine VS. resume home amlodipine.        #) acquired hypothyroidism: documented h/o such, on  Synthroid as outpatient.   Plan: cont home Synthroid.           #) History of multiple prior TIAs: Documented history of such, for which it appears that she is on Plavix as an outpatient.  Plan: Continue home Plavix.        #) Depression: Documented history of such, on Depakote as well as Zoloft as an outpatient.  In the context of presenting acute encephalopathy, will hold home Zoloft for now, while checking serum Depakote level, as above.  Plan:.  Hold Zoloft.  Check Depakote  level.        #) GERD: documented h/o such; on Protonix as outpatient.   Plan: continue home PPI.          #) Chronic diastolic heart failure: documented history of such, with most recent echocardiogram performed in November 2022 notable for LVEF greater than 75% as well as showing grade 1 diastolic dysfunction in the absence of significant valvular pathology, as further detailed above.. No clinical evidence to suggest acutely decompensated heart failure at this time. home diuretic regimen reportedly consists of the following: None.  We will closely monitor for development of ensuing development of acute volume overload given plan for administration of gentle IV fluids as a component of management presenting severe sepsis, as above.   Plan: monitor strict I's & O's and daily weights. Repeat BMP in AM. Check serum mag level.        DVT prophylaxis: SCD's   Code Status: DNR (per documentation available in her chart and confirmed by the patient's son, POA, this evening).  Family Communication: Case discussed with patient's son who is present at bedside. Disposition Plan: Per Rounding Team Consults called: none;  Admission status: Inpatient  Warrants inpatient status on basis of need for further evaluation and management of presenting severe sepsis due to pneumonia including provision of broad-spectrum IV antibiotics, with further evaluation for underlying etiology of patient's dementia,  including that of assessment for MRSA which would guide further tailoring of ensuing antibiotic regimen, as well as further evaluation management presenting acute metabolic encephalopathy, as detailed above.   PLEASE NOTE THAT DRAGON DICTATION SOFTWARE WAS USED IN THE CONSTRUCTION OF THIS NOTE.   [place] DO [facility]  From [place]   03/22/2021, 8:02 PM

## 2021-03-22 NOTE — ED Provider Notes (Signed)
[facility] Emergency Department Provider Note MRN:  [id number]  Arrival date & time: 03/22/21     Chief Complaint   Altered Mental Status   History of Present Illness   Danielle Bryan is a 88 y.o. year-old female with a history of hypertension, hyperlipidemia, hypothyroidism, osteoarthritis, stroke/TIA on Plavix, seizure disorder, GERD, depression presenting to the ED with chief complaint of AMS.  History is obtained from the patient's son as well as the patient herself.  He reports that she was recently diagnosed with COVID-19 on March 05, 2021.  At that time she was hospitalized and was subsequently discharged to an assisted living facility.  Patient had return of her normal functional status while at the assisted living facility however over the past 2 days she has become progressively altered.  He states that the patient has had difficulty with ambulation and difficulty expressing herself.  He states that she has had a worsening cough that has been productive for green sputum.  He is unaware that she has been running fevers.  There was no reported vomiting or diarrhea and no new rashes.    Review of Systems  Unable to obtain review of systems given the patient's altered mental status.  Patient's Health History    Past Medical History:  Diagnosis Date   Hypercholesterolemia    Hypertension    Hypothyroidism    Osteoarthritis    Reactive depression (situational)    Stroke ([place])    TIA (transient ischemic attack)    on plavix    Past Surgical History:  Procedure Laterality Date   CATARACT EXTRACTION Left 1992   CATARACT EXTRACTION Right 1995   DEBRIDEMENT AND CLOSURE WOUND      Family History  Problem Relation Age of Onset   Heart failure Mother    Pneumonia Mother    Heart failure Father     Social History   Socioeconomic History   Marital status: Widowed    Spouse name: Not on file   Number of children: 2   Years of education: 27    Highest education level: Not on file  Occupational History   Not on file  Tobacco Use   Smoking status: Never   Smokeless tobacco: Never  Substance and Sexual Activity   Alcohol use: No    Alcohol/week: 0.0 standard drinks   Drug use: No   Sexual activity: Not on file  Other Topics Concern   Not on file  Social History Narrative   Lives at home alone.    Has 2 children.   Caffeine: occasional    Social Determinants of Radio broadcast assistant Strain: Not on file  Food Insecurity: Not on file  Transportation Needs: Not on file  Physical Activity: Not on file  Stress: Not on file  Social Connections: Not on file  Intimate Partner Violence: Not on file     Physical Exam   Physical Exam Constitutional:      Appearance: She is well-developed. She is not ill-appearing.  HENT:     Head: Normocephalic and atraumatic.     Right Ear: External ear normal.     Left Ear: External ear normal.     Nose: Nose normal.  Cardiovascular:     Rate and Rhythm: Regular rhythm. Tachycardia present.     Pulses: Normal pulses.     Heart sounds: Normal heart sounds.  Pulmonary:     Effort: Pulmonary effort is normal.     Breath sounds: Rhonchi (  Right upper lobe) present.  Abdominal:     General: Abdomen is flat. There is no distension.     Palpations: Abdomen is soft.     Tenderness: There is no abdominal tenderness.  Musculoskeletal:     Cervical back: Neck supple.     Right lower leg: No edema.     Left lower leg: No edema.  Skin:    General: Skin is warm and dry.     Findings: No rash.  Neurological:     General: No focal deficit present.     Mental Status: She is alert.     Cranial Nerves: No cranial nerve deficit.     Sensory: No sensory deficit.     Motor: No weakness.      Diagnostic and Interventional Summary    EKG Interpretation  Date/Time:  Friday March 22 2021 19:33:52 EST Ventricular Rate:  115 PR Interval:  182 QRS Duration: 85 QT Interval:  311 QTC  Calculation: 431 R Axis:   -40 Text Interpretation: Sinus tachycardia Atrial premature complexes Left axis deviation Nonspecific T wave abnormality Confirmed by Lajean Saver [phone]) on 03/22/2021 7:38:30 PM       Labs Reviewed  RESP PANEL BY RT-PCR (FLU A&B, COVID) ARPGX2 - Abnormal; Notable for the following components:      Result Value   SARS Coronavirus 2 by RT PCR POSITIVE (*)    All other components within normal limits  CBC WITH DIFFERENTIAL/PLATELET - Abnormal; Notable for the following components:   WBC 20.2 (*)    Neutro Abs 15.5 (*)    Monocytes Absolute 2.0 (*)    Abs Immature Granulocytes 0.17 (*)    All other components within normal limits  COMPREHENSIVE METABOLIC PANEL - Abnormal; Notable for the following components:   Sodium 133 (*)    Chloride 96 (*)    Glucose, Bld 110 (*)    All other components within normal limits  CK - Abnormal; Notable for the following components:   Total CK 34 (*)    All other components within normal limits  CBG MONITORING, ED - Abnormal; Notable for the following components:   Glucose-Capillary 123 (*)    All other components within normal limits  CULTURE, BLOOD (ROUTINE X 2)  CULTURE, BLOOD (ROUTINE X 2)  MRSA NEXT GEN BY PCR, NASAL  EXPECTORATED SPUTUM ASSESSMENT W GRAM STAIN, RFLX TO RESP C  TSH  MAGNESIUM  PROCALCITONIN  LACTIC ACID, PLASMA  VALPROIC ACID LEVEL  URINALYSIS, ROUTINE W REFLEX MICROSCOPIC  PHOSPHORUS  MAGNESIUM  COMPREHENSIVE METABOLIC PANEL  CBC WITH DIFFERENTIAL/PLATELET  LACTIC ACID, PLASMA  BRAIN NATRIURETIC PEPTIDE  STREP PNEUMONIAE URINARY ANTIGEN  AMMONIA  RAPID URINE DRUG SCREEN, HOSP PERFORMED  METHYLMALONIC ACID, SERUM  BLOOD GAS, VENOUS    CT HEAD WO CONTRAST (5MM)  Final Result    DG Chest Portable 1 View  Final Result      Medications  acetaminophen (TYLENOL) tablet 650 mg (has no administration in time range)    Or  acetaminophen (TYLENOL) suppository 650 mg (has no  administration in time range)  lactated ringers infusion ( Intravenous New Bag/Given 03/22/21 2024)  azithromycin (ZITHROMAX) 500 mg in sodium chloride 0.9 % 250 mL IVPB (has no administration in time range)  cefTRIAXone (ROCEPHIN) 1 g in sodium chloride 0.9 % 100 mL IVPB (has no administration in time range)  benzonatate (TESSALON) capsule 200 mg (has no administration in time range)  amLODipine (NORVASC) tablet 5 mg (has no administration in  time range)  clopidogrel (PLAVIX) tablet 75 mg (has no administration in time range)  divalproex (DEPAKOTE ER) 24 hr tablet 500 mg (has no administration in time range)  levothyroxine (SYNTHROID) tablet 50 mcg (has no administration in time range)  pantoprazole (PROTONIX) EC tablet 40 mg (has no administration in time range)  vancomycin (VANCOCIN) IVPB 1000 mg/200 mL premix (has no administration in time range)  vancomycin (VANCOREADY) IVPB 750 mg/150 mL (has no administration in time range)  lactated ringers bolus 1,000 mL (0 mLs Intravenous Stopped 03/22/21 2010)  cefTRIAXone (ROCEPHIN) 1 g in sodium chloride 0.9 % 100 mL IVPB (0 g Intravenous Stopped 03/22/21 1857)  azithromycin (ZITHROMAX) 500 mg in sodium chloride 0.9 % 250 mL IVPB (0 mg Intravenous Stopped 03/22/21 1905)     Procedures  /  Critical Care Procedures  ED Course and Medical Decision Making  Initial Impression and Ddx 88 year old female presents to emergency department for evaluation of altered mental status.  Differential diagnosis includes was not to the following: Infection such as urinary tract infection and pneumonia, ischemic stroke, hemorrhagic stroke, electrolyte abnormalities.  Given that the patient had recent COVID-19 infection and has had worsening cough and congestion I have concern for pneumonia.  Will obtain labs and imaging to further evaluate.  Past medical/surgical history that increases complexity of ED encounter: Recent COVID-19 infection  Interpretation of  Diagnostics I personally reviewed the EKG, Chest Xray, and Cardiac Monitor and my interpretation is as follows: EKG was reviewed and the patient was in sinus tachycardia with premature atrial complexes, also the patient's cardiac monitor was reviewed and she remained in sinus tachycardia throughout her stay in the emergency department.  Chest x-ray was concerning for right upper lobe pneumonia.    Patient was noted to have a leukocytosis with neutrophilic predominance on CBC.  Her electrolyte panel was largely unremarkable.  2 blood cultures were obtained and the patient was started on community-acquired coverage for pneumonia.  Head CT was without acute intracranial abnormality.  Patient Reassessment and Ultimate Disposition/Management I consulted the hospitalist for admission given that the patient has encephalopathy secondary to pneumonia diagnosed today.  They agreed admit the patient to their service for further management of her acute encephalopathy secondary to pneumonia.  Patient management required discussion with the following services or consulting groups:  Hospitalist Service  Complexity of Problems Addressed Acute illness or injury that poses threat of life of bodily function  Additional Data Reviewed and Analyzed Further history obtained from: Son as mentioned above.  Factors Impacting ED Encounter Risk Consideration of hospitalization    Final Clinical Impressions(s) / ED Diagnoses     ICD-10-CM   1. Altered mental status, unspecified altered mental status type  R41.82     2. Pneumonia due to infectious organism, unspecified laterality, unspecified part of lung  J18.9       ED Discharge Orders     None        Discharge Instructions Discussed with and Provided to Patient:   Discharge Instructions   None       Zachery Dakins, MD 03/22/21 2158    Lajean Saver, MD 03/23/21 1547

## 2021-03-22 NOTE — ED Notes (Signed)
Pt brief changed and sheets changed

## 2021-03-22 NOTE — Progress Notes (Signed)
Pharmacy Antibiotic Note  Danielle Bryan is a 88 y.o. female admitted on 03/22/2021 with pneumonia.  Pt tested positive for COVID-19 in the last week. Pharmacy has been consulted for vancomycin dosing.  WBC elevated at 20.2, patient remains afebrile. CXR showing new focal opacity suspicious for pneumonia. Renal function appears to be at baseline with Scr 0.64.  Plan: Vancomycin 1000 mg IV x 1 Vancomycin 750 mg IV q36 h (estimated AUC 434) Ceftriaxone per MD Azithromycin per MD Follow-up cultures and MRSA PCR  Monitor renal function and signs of clinical improvement  Height: 5\' 1"  (154.9 cm) Weight: 53 kg (116 lb 13.5 oz) IBW/kg (Calculated) : 47.8  Temp (24hrs), Avg:99 F (37.2 C), Min:98.9 F (37.2 C), Max:99 F (37.2 C)  Recent Labs  Lab 03/22/21 1751  WBC 20.2*  CREATININE 0.64    Estimated Creatinine Clearance: 31 mL/min (by C-G formula based on SCr of 0.64 mg/dL).    Allergies  Allergen Reactions   Escitalopram Oxalate Nausea Only   Celexa [Citalopram]     Other reaction(s): Unknown   Escitalopram Other (See Comments)   Lisinopril Other (See Comments)    Very decreased blood pressure    Statins Other (See Comments)    myalgias   Sulfa Antibiotics Hives and Other (See Comments)    Antimicrobials this admission: 1/27 ceftriaxone >>  1/27 vancomycin >>  1/27 azithromycin >>  Dose adjustments this admission: none  Microbiology results: 1/27 BCx: pending 1/27 MRSA PCR: pending Thank you for involving pharmacy in this patient's care.  Elita Quick, PharmD PGY1 Ambulatory Care Pharmacy Resident 03/22/2021 8:28 PM  **Pharmacist phone directory can be found on [email or web address] listed under [facility]**

## 2021-03-23 DIAGNOSIS — Z7189 Other specified counseling: Secondary | ICD-10-CM

## 2021-03-23 DIAGNOSIS — Z66 Do not resuscitate: Secondary | ICD-10-CM | POA: Diagnosis not present

## 2021-03-23 DIAGNOSIS — Z515 Encounter for palliative care: Secondary | ICD-10-CM

## 2021-03-23 DIAGNOSIS — G9341 Metabolic encephalopathy: Secondary | ICD-10-CM | POA: Diagnosis not present

## 2021-03-23 LAB — BLOOD GAS, VENOUS
Acid-Base Excess: 3.7 mmol/L — ABNORMAL HIGH (ref 0.0–2.0)
Bicarbonate: 28.3 mmol/L — ABNORMAL HIGH (ref 20.0–28.0)
Drawn by: 6779
FIO2: 100
O2 Saturation: 70 %
Patient temperature: 36.1
pCO2, Ven: 45.8 mmHg (ref 44.0–60.0)
pH, Ven: 7.403 (ref 7.250–7.430)
pO2, Ven: 35.2 mmHg (ref 32.0–45.0)

## 2021-03-23 LAB — URINALYSIS, ROUTINE W REFLEX MICROSCOPIC
Bacteria, UA: NONE SEEN
Bilirubin Urine: NEGATIVE
Glucose, UA: NEGATIVE mg/dL
Ketones, ur: 20 mg/dL — AB
Leukocytes,Ua: NEGATIVE
Nitrite: NEGATIVE
Protein, ur: 30 mg/dL — AB
Specific Gravity, Urine: 1.024 (ref 1.005–1.030)
pH: 5 (ref 5.0–8.0)

## 2021-03-23 LAB — CBC WITH DIFFERENTIAL/PLATELET
Abs Immature Granulocytes: 0.18 10*3/uL — ABNORMAL HIGH (ref 0.00–0.07)
Basophils Absolute: 0 10*3/uL (ref 0.0–0.1)
Basophils Relative: 0 %
Eosinophils Absolute: 0 10*3/uL (ref 0.0–0.5)
Eosinophils Relative: 0 %
HCT: 37.7 % (ref 36.0–46.0)
Hemoglobin: 12 g/dL (ref 12.0–15.0)
Immature Granulocytes: 1 %
Lymphocytes Relative: 7 %
Lymphs Abs: 1.7 10*3/uL (ref 0.7–4.0)
MCH: 28.5 pg (ref 26.0–34.0)
MCHC: 31.8 g/dL (ref 30.0–36.0)
MCV: 89.5 fL (ref 80.0–100.0)
Monocytes Absolute: 2.3 10*3/uL — ABNORMAL HIGH (ref 0.1–1.0)
Monocytes Relative: 10 %
Neutro Abs: 19.1 10*3/uL — ABNORMAL HIGH (ref 1.7–7.7)
Neutrophils Relative %: 82 %
Platelets: 138 10*3/uL — ABNORMAL LOW (ref 150–400)
RBC: 4.21 MIL/uL (ref 3.87–5.11)
RDW: 14 % (ref 11.5–15.5)
WBC: 23.3 10*3/uL — ABNORMAL HIGH (ref 4.0–10.5)
nRBC: 0 % (ref 0.0–0.2)

## 2021-03-23 LAB — MAGNESIUM: Magnesium: 1.8 mg/dL (ref 1.7–2.4)

## 2021-03-23 LAB — COMPREHENSIVE METABOLIC PANEL
ALT: 7 U/L (ref 0–44)
AST: 16 U/L (ref 15–41)
Albumin: 3.2 g/dL — ABNORMAL LOW (ref 3.5–5.0)
Alkaline Phosphatase: 56 U/L (ref 38–126)
Anion gap: 11 (ref 5–15)
BUN: 12 mg/dL (ref 8–23)
CO2: 24 mmol/L (ref 22–32)
Calcium: 8.8 mg/dL — ABNORMAL LOW (ref 8.9–10.3)
Chloride: 96 mmol/L — ABNORMAL LOW (ref 98–111)
Creatinine, Ser: 0.61 mg/dL (ref 0.44–1.00)
GFR, Estimated: >60 mL/min (ref >60)
Glucose, Bld: 159 mg/dL — ABNORMAL HIGH (ref 70–99)
Potassium: 4.1 mmol/L (ref 3.5–5.1)
Sodium: 131 mmol/L — ABNORMAL LOW (ref 135–145)
Total Bilirubin: 0.7 mg/dL (ref 0.3–1.2)
Total Protein: 6.2 g/dL — ABNORMAL LOW (ref 6.5–8.1)

## 2021-03-23 LAB — STREP PNEUMONIAE URINARY ANTIGEN: Strep Pneumo Urinary Antigen: NEGATIVE

## 2021-03-23 LAB — PHOSPHORUS: Phosphorus: 4 mg/dL (ref 2.5–4.6)

## 2021-03-23 LAB — AMMONIA: Ammonia: <10 umol/L (ref 9–35)

## 2021-03-23 LAB — MRSA NEXT GEN BY PCR, NASAL: MRSA by PCR Next Gen: NOT DETECTED

## 2021-03-23 MED ORDER — ACETAMINOPHEN 650 MG RE SUPP: 650.0000 mg | Freq: Four times a day (QID) | RECTAL | Status: DC | PRN

## 2021-03-23 MED ORDER — LEVOTHYROXINE SODIUM 25 MCG PO TABS: 50.0000 ug | ORAL_TABLET | ORAL | Status: DC

## 2021-03-23 MED ORDER — CLOPIDOGREL BISULFATE 75 MG PO TABS: 75.0000 mg | ORAL_TABLET | Freq: Every morning | ORAL | Status: DC

## 2021-03-23 MED ORDER — DEXTROSE-NACL 5-0.9 % IV SOLN: INTRAVENOUS | Status: DC

## 2021-03-23 MED ORDER — SODIUM CHLORIDE 0.9 % IV SOLN
2.0000 g | INTRAVENOUS | Status: DC
  Administered 2021-03-24 – 2021-03-25 (×2): 2 g via INTRAVENOUS
  Filled 2021-03-23 (×3): qty 20

## 2021-03-23 MED ORDER — PANTOPRAZOLE 2 MG/ML SUSPENSION: 40.0000 mg | Freq: Every evening | ORAL | Status: DC

## 2021-03-23 MED ORDER — AMLODIPINE BESYLATE 5 MG PO TABS: 5.0000 mg | ORAL_TABLET | Freq: Every morning | ORAL | Status: DC

## 2021-03-23 MED ORDER — ACETAMINOPHEN 325 MG PO TABS
650.0000 mg | ORAL_TABLET | Freq: Four times a day (QID) | ORAL | Status: DC | PRN
  Administered 2021-03-25: 650 mg
  Filled 2021-03-23: qty 2

## 2021-03-23 NOTE — Consult Note (Signed)
Palliative Medicine Inpatient Consult Note  Consulting Provider: Shelly Coss, MD  Reason for consult:   [facility] Palliative Medicine Consult  Reason for Consult? Elderly patient with pneumonia.  Needs goals of care, poor prognosis   HPI:  Per intake H&P --> Patient is a 88 year old female with history of hypertension, hypothyroidism, TIA, recent COVID infection, chronic diastolic congestive heart failure who presented here with altered mental status from assisted living facility.    Palliative care saw Sarai in November of 2022 she was made a DNR at that time and Palliative/Hospice services were discussed though [name] (son) was nor ready for these at that time.   On this hospitalization Palliative care has been asked to get involved in the setting of pneumonia to further identify goals moving forward.  Clinical Assessment/Goals of Care:  *Please note that this is a verbal dictation therefore any spelling or grammatical errors are due to the "[place]" system interpretation.  I have reviewed medical records including EPIC notes, labs and imaging, received report from bedside RN, assessed the patient who is resting bed, not vocal intermittently tearful as I was speaking to her.     I called patients son,  [name] to further discuss diagnosis prognosis, GOC, EOL wishes, disposition and options.   I introduced Palliative Medicine as specialized medical care for people living with serious illness. It focuses on providing relief from the symptoms and stress of a serious illness. The goal is to improve quality of life for both the patient and the family.  Medical History Review and Understanding:  Past medical history of  hypertension, hyperlipidemia, hypothyroidism, osteoarthritis, stroke/TIA on Plavix, seizure disorder, GERD, depression, prior COVID.   [name] and I reviewed patients present pneumonia and how this is identified as the "old mans friend" as it  can take patients from this world quickly and painlessly.   Social History:  Thomure lives in [place], [place]. She is a widow as her husband passed away in 23-May-2009. She has two sons, [name] and Hassell Done. 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 She is identified and a strong and independent woman. She does have a degree of faith and practices within [place].  Functional and Nutritional State:  Annesha had been living in a private residence until mid December when she transitioned to [facility] assisted living. She now gets help with all bADLs. She can walk short distances with a walker though longer distances she needs a wheelchair. She had prior been able to feed herself.   Per Theo Dills had a pretty good appetite before admission. She has always struggled with sufficient water intake though.   Advance Directives: A detailed discussion was had today regarding advanced directives.  [name] tells me he is the primary decision maker for the patient - he does speak with his brother before making any big decisions.  Code Status: Concepts specific to code status, artifical feeding and hydration, continued IV antibiotics and rehospitalization was had.  Reviewed with [name] - DNR/DNI code status with the understanding that the patient would not receive CPR, defibrillation, ACLS medications, or intubation.   The difference between a aggressive medical intervention path  and a palliative comfort care path for this patient at this time was had.Hospice and Palliative Care services outpatient were explained.  Goals for the Future:  At this moment, [name] would like to continue with present treatments for pneumonia to see if Addylin will improve. He shares  the hopes for improvements but he has a family member who is a cardiologist who has been guiding him.[name] remains very realistic about the serious nature of a pneumonia in a frail elderly woman.   Discussed  if she is not thriving what the shift in focus to comfort mediated care would look like.   Discussed the importance of continued conversation with family and their  medical providers regarding overall plan of care and treatment options, ensuring decisions are within the context of the patients values and GOCs.  Decision Maker: Taffie Eckmann (son) - [phone]  SUMMARY OF RECOMMENDATIONS   DNAR/DNI  Continue continue treatments to see if improvements can be made  If patient further declines or neglects to improve additional conversations on comfort focused care will be held  Goals: Patients son, [name] is optimistic for improvements  Ongoing palliative support  Code Status/Advance Care Planning: DNAR/DNI  Palliative Prophylaxis:  Aspiration, Bowel Regimen, Delirium Protocol, Frequent Pain Assessment, Oral Care, Palliative Wound Care, and Turn Reposition  Additional Recommendations (Limitations, Scope, Preferences): Treat what is treatable  Psycho-social/Spiritual:  Desire for further Chaplaincy support: Declined Additional Recommendations: Education on PNA in a frail older patient.    Prognosis: Unclear, will depend on how these next few days go in terms of PNA.   Though even with improvements it's important to consider that patient has been declining since November. It would not be surprising based upon her severe frailty and chronic co-morbidities for her to pass away w/in < 12 months.   Discharge Planning: Discharge plan uncertain.   Vitals:   03/23/21 1207 03/23/21 1229  BP:  (!) 156/63  Pulse: (!) 106 (!) 110  Resp:  20  Temp:  (!) 100.4 F (38 C)  SpO2:  100%    Intake/Output Summary (Last 24 hours) at 03/23/2021 1554 Last data filed at 03/23/2021 1200 Gross per 24 hour  Intake 650 ml  Output 275 ml  Net 375 ml   Last Weight  Most recent update: 03/23/2021  5:50 AM    Weight  55.9 kg (123 lb 3.8 oz)            Gen:  Frail elderly F in mild  distress HEENT: moist mucous membranes CV: Irregular rate and rhythm  PULM: On 2LPM [place] breathing Is even and non-labored ABD: soft/non-tender  EXT: No edema  Neuro: Disoriented, tearful  PPS: 10-20%   This conversation/these recommendations were discussed with patient primary care team, Dr. Tawanna Solo  MDM [facility]  Medical Decision Making:4 #/Complex Problems: 4                     Data Reviewed:4                 Management:4 (1-Straightforward, 2-Low, 3-Moderate, 4-High) ______________________________________________________ [facility] Team Team Cell Phone: [phone] Please utilize secure chat with additional questions, if there is no response within 30 minutes please call the above phone number  Palliative Medicine Team providers are available by phone from 7am to 7pm daily and can be reached through the team cell phone.  Should this patient require assistance outside of these hours, please call the patient's attending physician.

## 2021-03-23 NOTE — Progress Notes (Signed)
PROGRESS NOTE    Danielle Bryan  ZOX:[id number] DOB: [birth year] DOA: 03/22/2021 PCP: Velna Hatchet, MD   Chief Complain: Altered mental status  Brief Narrative: Patient is a 88 year old female with history of hypertension, hypothyroidism, TIA, recent COVID infection, chronic diastolic congestive heart failure who presented here with altered mental status from assisted living facility.  COVID screen test came out to be positive again on admission, first positive on 03/05/2021.  She initially had symptoms of rhinorrhea when she was diagnosed with covid first which subsequently improved. Over 3 days before admission here, she developed new onset cough, confusion, patient was then brought to the emergency department.  She was recently admitted here from 01/12/2021 to 01/16/2021 for the management of nondisplaced nasal bone fracture.  On presentation, she was tachycardic but saturating fine on room air.  Lab work showed elevated leukocytes.  Chest x-ray showed interval development of focal opacity on the right upper lobe concerning for pneumonia.  CTA did not show any acute intracranial abnormalities.  Patient was admitted for the management of pneumonia on the background of recent COVID infection.  Started on antibiotics for CAP.Remains encephalopathic.  Assessment & Plan:   Principal Problem:   Acute metabolic encephalopathy Active Problems:   Hypothyroidism   HTN (hypertension)   Severe sepsis ([place])   Community acquired pneumonia   Chronic diastolic CHF (congestive heart failure) (HCC)   GERD (gastroesophageal reflux disease)   Depression   Suspected severe sepsis secondary to community-acquired pneumonia: Presented with new onset cough, altered mental status.  Chest x-ray showed right upper lobe opacity.  Met sepsis criteria with leukocytosis, tachycardia, tachypnea.  On 2 L of oxygen per minute Has been started on broad spectrum antibiotics with azithromycin, ceftriaxone, vancomycin. If MRSA  PCR is negative, so discontinued vancomycin.  Follow blood cultures.  Currently she is afebrile, hemodynamically stable.  Follow-up sputum culture,blood culture.Negative  strep pneumonia antigen in urine.  Continue flutter valve, spirometry, cough medications.  Monitor leukocytosis. Will keep her NPO given high risk of aspiration  Acute metabolic encephalopathy: Presented with 3 days history of confusion.  CT head did not show any acute intracranial abnormalities.  Monitor mental status.  Delirium precautions.  COVID infection: COVID screen test initially  came out to be positive on 03/05/21.  Still Positive.  Not on precautions  Hypertension: Currently BP stable.  Takes amlodipine which has been continued.  Hypothyroidism: Continue Synthyroid  History of TIA: On Plavix  Depression: Depakote, Zoloft.  These are held due to altered mental status  GERD: Continue Protonix  Chronic diastolic congestive heart failure: Last echo done in November 2022 showed EF of greater than 81%, grade 1 diastolic dysfunction.  Currently she is euvolemic.  Deconditioning/debility/generalized weakness: She lives in ALF.  We have requested  for PT/OT evaluation. Need to discuss goals of care.  Patient is DNR.  Elderly patient now with pneumonia, encephalopathy.  High risk for decompensation.  Palliative  care consulted.  Son wants to continue the management for pneumonia anticipating recovery but if she does not improve then he is open about discussing for comfort care versus hospice.           DVT prophylaxis:SCD Code Status: DNR Family Communication: Discussed with Hulan Fess on phone on 1/28 Patient status:Inpatient  Dispo: The patient is from: ALF              Anticipated d/c is to: ALF vs SnF  Anticipated d/c date is: Not sure at this moment  Consultants: None  Procedures:None  Antimicrobials:  Anti-infectives (From admission, onward)    Start     Dose/Rate Route Frequency Ordered  Stop   03/24/21 2130  vancomycin (VANCOREADY) IVPB 750 mg/150 mL        750 mg 150 mL/hr over 60 Minutes Intravenous Every 36 hours 03/22/21 2022     03/23/21 1500  azithromycin (ZITHROMAX) 500 mg in sodium chloride 0.9 % 250 mL IVPB        500 mg 250 mL/hr over 60 Minutes Intravenous Every 24 hours 03/22/21 1957     03/23/21 1500  cefTRIAXone (ROCEPHIN) 1 g in sodium chloride 0.9 % 100 mL IVPB        1 g 200 mL/hr over 30 Minutes Intravenous Every 24 hours 03/22/21 1957     03/22/21 2030  vancomycin (VANCOCIN) IVPB 1000 mg/200 mL premix        1,000 mg 200 mL/hr over 60 Minutes Intravenous  Once 03/22/21 2015 03/23/21 0117   03/22/21 1730  cefTRIAXone (ROCEPHIN) 1 g in sodium chloride 0.9 % 100 mL IVPB        1 g 200 mL/hr over 30 Minutes Intravenous  Once 03/22/21 1715 03/22/21 1857   03/22/21 1730  azithromycin (ZITHROMAX) 500 mg in sodium chloride 0.9 % 250 mL IVPB        500 mg 250 mL/hr over 60 Minutes Intravenous  Once 03/22/21 1715 03/22/21 1905       Subjective:  Patient seen and examined at the bedside this morning.  Hemodynamically stable.  On 2 L of oxygen per minute.  Not in respiratory distress.  Confused, remains encephalopathic.  Did not wake up on calling her name.  Developed  mild grade fever this afternoon.  Objective: Vitals:   03/23/21 0008 03/23/21 0352 03/23/21 0500 03/23/21 0747  BP: (!) 165/68 (!) 155/78  136/85  Pulse: 96 80  100  Resp: (!) 26 (!) 24  (!) 22  Temp: 97.7 F (36.5 C) 98.2 F (36.8 C)  99.6 F (37.6 C)  TempSrc: Oral Axillary  Axillary  SpO2: 100% 100%  99%  Weight:   55.9 kg   Height:        Intake/Output Summary (Last 24 hours) at 03/23/2021 0823 Last data filed at 03/23/2021 0500 Gross per 24 hour  Intake 650 ml  Output 275 ml  Net 375 ml   Filed Weights   03/22/21 1609 03/23/21 0500  Weight: 53 kg 55.9 kg    Examination:  General exam: Very deconditioned, confused, sleepy/drowsy,frail ,weak Respiratory system:  Diminished air sounds bilaterally, no wheezes or crackles  Cardiovascular system: S1 & S2 heard, RRR.  Gastrointestinal system: Abdomen is nondistended, soft and nontender. Central nervous system: Not alert or oriented Extremities: No edema, no clubbing ,no cyanosis Skin: No rashes, no ulcers,no icterus      Data Reviewed: I have personally reviewed following labs and imaging studies  CBC: Recent Labs  Lab 03/22/21 1751 03/23/21 0138  WBC 20.2* 23.3*  NEUTROABS 15.5* 19.1*  HGB 12.7 12.0  HCT 38.5 37.7  MCV 88.7 89.5  PLT 161 789*   Basic Metabolic Panel: Recent Labs  Lab 03/22/21 1751 03/23/21 0138  NA 133* 131*  K 3.8 4.1  CL 96* 96*  CO2 25 24  GLUCOSE 110* 159*  BUN 11 12  CREATININE 0.64 0.61  CALCIUM 9.2 8.8*  MG 1.8 1.8  PHOS  --  4.0  GFR: Estimated Creatinine Clearance: 31 mL/min (by C-G formula based on SCr of 0.61 mg/dL). Liver Function Tests: Recent Labs  Lab 03/22/21 1751 03/23/21 0138  AST 15 16  ALT 9 7  ALKPHOS 73 56  BILITOT 1.0 0.7  PROT 6.7 6.2*  ALBUMIN 3.5 3.2*   No results for input(s): LIPASE, AMYLASE in the last 168 hours. Recent Labs  Lab 03/23/21 0138  AMMONIA <10   Coagulation Profile: No results for input(s): INR, PROTIME in the last 168 hours. Cardiac Enzymes: Recent Labs  Lab 03/22/21 2000  CKTOTAL 34*   BNP (last 3 results) No results for input(s): PROBNP in the last 8760 hours. HbA1C: No results for input(s): HGBA1C in the last 72 hours. CBG: Recent Labs  Lab 03/22/21 1955  GLUCAP 123*   Lipid Profile: No results for input(s): CHOL, HDL, LDLCALC, TRIG, CHOLHDL, LDLDIRECT in the last 72 hours. Thyroid Function Tests: Recent Labs    03/22/21 1751  TSH 3.416   Anemia Panel: No results for input(s): VITAMINB12, FOLATE, FERRITIN, TIBC, IRON, RETICCTPCT in the last 72 hours. Sepsis Labs: Recent Labs  Lab 03/22/21 2000 03/22/21 2035 03/22/21 2247  PROCALCITON 0.12  --   --   LATICACIDVEN  --  1.1  1.0    Recent Results (from the past 240 hour(s))  Resp Panel by RT-PCR (Flu A&B, Covid) Nasopharyngeal Swab     Status: Abnormal   Collection Time: 03/22/21  8:44 PM   Specimen: Nasopharyngeal Swab; Nasopharyngeal(NP) swabs in vial transport medium  Result Value Ref Range Status   SARS Coronavirus 2 by RT PCR POSITIVE (A) NEGATIVE Final    Comment: (NOTE) SARS-CoV-2 target nucleic acids are DETECTED.  The SARS-CoV-2 RNA is generally detectable in upper respiratory specimens during the acute phase of infection. Positive results are indicative of the presence of the identified virus, but do not rule out bacterial infection or co-infection with other pathogens not detected by the test. Clinical correlation with patient history and other diagnostic information is necessary to determine patient infection status. The expected result is Negative.  Fact Sheet for Patients: EntrepreneurPulse.com.au  Fact Sheet for Healthcare Providers: IncredibleEmployment.be  This test is not yet approved or cleared by the [place] FDA and  has been authorized for detection and/or diagnosis of SARS-CoV-2 by FDA under an Emergency Use Authorization (EUA).  This EUA will remain in effect (meaning this test can be used) for the duration of  the COVID-19 declaration under Section 564(b)(1) of the A ct, 21 U.S.C. section 360bbb-3(b)(1), unless the authorization is terminated or revoked sooner.     Influenza A by PCR NEGATIVE NEGATIVE Final   Influenza B by PCR NEGATIVE NEGATIVE Final    Comment: (NOTE) The Xpert Xpress SARS-CoV-2/FLU/RSV plus assay is intended as an aid in the diagnosis of influenza from Nasopharyngeal swab specimens and should not be used as a sole basis for treatment. Nasal washings and aspirates are unacceptable for Xpert Xpress SARS-CoV-2/FLU/RSV testing.  Fact Sheet for Patients: EntrepreneurPulse.com.au  Fact Sheet for  Healthcare Providers: IncredibleEmployment.be  This test is not yet approved or cleared by the [place] FDA and has been authorized for detection and/or diagnosis of SARS-CoV-2 by FDA under an Emergency Use Authorization (EUA). This EUA will remain in effect (meaning this test can be used) for the duration of the COVID-19 declaration under Section 564(b)(1) of the Act, 21 U.S.C. section 360bbb-3(b)(1), unless the authorization is terminated or revoked.  Performed at [facility], [place]  [address], [place], [place] [address]          Radiology Studies: CT HEAD WO CONTRAST (5MM)  Result Date: 03/22/2021 CLINICAL DATA:  Altered mental status. EXAM: CT HEAD WITHOUT CONTRAST TECHNIQUE: Contiguous axial images were obtained from the base of the skull through the vertex without intravenous contrast. RADIATION DOSE REDUCTION: This exam was performed according to the departmental dose-optimization program which includes automated exposure control, adjustment of the mA and/or kV according to patient size and/or use of iterative reconstruction technique. COMPARISON:  January 12, 2021. FINDINGS: Brain: Mild chronic ischemic white matter disease is noted. No mass effect or midline shift is noted. Ventricular size is within normal limits. There is no evidence of mass lesion, hemorrhage or acute infarction. Vascular: No hyperdense vessel or unexpected calcification. Skull: Normal. Negative for fracture or focal lesion. Sinuses/Orbits: No acute finding. Other: None. IMPRESSION: No acute intracranial abnormality seen. Electronically Signed   By: Marijo Conception M.D.   On: 03/22/2021 17:19   DG Chest Portable 1 View  Result Date: 03/22/2021 CLINICAL DATA:  concern for pna after recent Covid19 infection EXAM: PORTABLE CHEST 1 VIEW COMPARISON:  01/12/2021 FINDINGS: Heart size within normal limits. Aortic atherosclerosis. New focal opacity within the right upper lobe. Left lung  appears clear. No definite pleural fluid collection. No pneumothorax. Exaggerated thoracic kyphosis. IMPRESSION: New focal opacity within the right upper lobe suspicious for pneumonia. Radiographic follow-up to resolution is recommended. Electronically Signed   By: Davina Poke D.O.   On: 03/22/2021 16:38        Scheduled Meds:  amLODipine  5 mg Oral q morning   clopidogrel  75 mg Oral q morning   divalproex  500 mg Oral q morning   levothyroxine  50 mcg Oral Once per day on Mon Tue Wed Thu Fri Sat   pantoprazole  40 mg Oral QPM   Continuous Infusions:  azithromycin     cefTRIAXone (ROCEPHIN)  IV     [START ON 03/24/2021] vancomycin       LOS: 1 day       Shelly Coss, MD [facility] P1/28/2023, 8:23 AM

## 2021-03-23 NOTE — Evaluation (Signed)
Clinical/Bedside Swallow Evaluation Patient Details  Name: Danielle Bryan MRN: [id number] Date of Birth: [birth year]  Today's Date: 03/23/2021 Time: SLP Start Time (ACUTE ONLY): 66 SLP Stop Time (ACUTE ONLY): 1440 SLP Time Calculation (min) (ACUTE ONLY): 25 min  Past Medical History:  Past Medical History:  Diagnosis Date   Hypercholesterolemia    Hypertension    Hypothyroidism    Osteoarthritis    Reactive depression (situational)    Stroke ([place])    TIA (transient ischemic attack)    on plavix   Past Surgical History:  Past Surgical History:  Procedure Laterality Date   CATARACT EXTRACTION Left 1992   CATARACT EXTRACTION Right 1995   DEBRIDEMENT AND CLOSURE WOUND     HPI:  Patient is a 88 y.o. female who is a resident at [facility] with PMH: HTN, acquired hypothyroidism,, multiple prior TIA's, recent COVID-19 infection, chronic diastolic heart failure,, who was admitted to [facility] on 1/27 with severe sepsis due community acquired PNA after presenting to [facility] ED for evaluation of AMS. CT head did not show any acute intracranial abnormalities, CT chest revealed new focal opacity within the right upper lobe suspicious for pneumonia.    Assessment / Plan / Recommendation  Clinical Impression  Patient presents with what appears to be a primary cognitive based dysphagia as she is currently in a state of extreme lethargy. Per son's report (in room during portion of eval) patient was living at [facility] and appetite had been good. At baseline, patient not exhibiting significant cognitive or speech deficits. During this bedside swallow evaluation, patient exhibited moderately impaired oral motor function, able to follow basic directions to stick out tongue, open mouth, but minimal lingual movement observed. She was not able to manipulate small ice chip placed in anterior portion of mouth or suck water from toothette sponge despite her starting to attempt. She did nod head when son asked her if  she knew who he was but she only attempted to communicate one time with SLP, starting to move tongue and produced a very brief faint voice. At this time, patient is not safe for PO's and SLP is recommending continue with NPO status and necessary medications only crushed in puree and given only if patient fully alert and accepting. SLP to follow patient for readiness to trial PO's. SLP Visit Diagnosis: Dysphagia, unspecified (R13.10)    Aspiration Risk       Diet Recommendation NPO;Other (Comment) (necessary medications crushed in puree if patient is able to be alert)   Medication Administration: Other (Comment) (necessary medications crushed in puree if patient is able to be alert)    Other  Recommendations      Recommendations for follow up therapy are one component of a multi-disciplinary discharge planning process, led by the attending physician.  Recommendations may be updated based on patient status, additional functional criteria and insurance authorization.  Follow up Recommendations Home health SLP      Assistance Recommended at Discharge Frequent or constant Supervision/Assistance  Functional Status Assessment Patient has had a recent decline in their functional status and demonstrates the ability to make significant improvements in function in a reasonable and predictable amount of time.  Frequency and Duration min 2x/week  1 week       Prognosis Prognosis for Safe Diet Advancement: Good      Swallow Study   General Date of Onset: 03/22/21 HPI: Patient is a 88 y.o. female who is a resident at [facility] with PMH: HTN, acquired hypothyroidism,,  multiple prior TIA's, recent COVID-19 infection, chronic diastolic heart failure,, who was admitted to [facility] on 1/27 with severe sepsis due community acquired PNA after presenting to [facility] ED for evaluation of AMS. CT head did not show any acute intracranial abnormalities, CT chest revealed new focal opacity within the right upper lobe  suspicious for pneumonia. Type of Study: Bedside Swallow Evaluation Previous Swallow Assessment: remote,during previous admissions Diet Prior to this Study: NPO Temperature Spikes Noted: Yes (100.4) Respiratory Status: Nasal cannula Behavior/Cognition: Lethargic/Drowsy;Requires cueing Oral Cavity Assessment: Dry Oral Care Completed by SLP: Yes Oral Cavity - Dentition: Adequate natural dentition Self-Feeding Abilities: Total assist Patient Positioning: Upright in bed Baseline Vocal Quality: Not observed Volitional Cough: Cognitively unable to elicit    Oral/Motor/Sensory Function Overall Oral Motor/Sensory Function: Moderate impairment Facial ROM: Reduced right Facial Symmetry: Abnormal symmetry right Facial Strength: Reduced right Lingual ROM: Reduced right;Reduced left Lingual Symmetry: Abnormal symmetry right Lingual Strength: Reduced   Ice Chips Ice chips: Impaired Oral Phase Impairments: Poor awareness of bolus;Reduced lingual movement/coordination Pharyngeal Phase Impairments: Other (comments) Other Comments: Patient unable to move small ice chip in anterior portion of mouth despite a couple attempts   Thin Liquid Thin Liquid: Impaired Presentation:  (toothette sponge) Oral Phase Functional Implications: Other (comment) Pharyngeal  Phase Impairments: Other (comments) Other Comments: Patient unable to even accept small amount of water on toothette sponge    Nectar Thick Nectar Thick Liquid: Not tested   Honey Thick Honey Thick Liquid: Not tested   Puree Puree: Not tested   Solid     Solid: Not tested     Sonia Baller, MA, CCC-SLP Speech Therapy

## 2021-03-23 NOTE — Plan of Care (Signed)

## 2021-03-23 NOTE — Plan of Care (Signed)
Pt is alert, not verbal. Pt noted trying to speak but not able too. Pt is weak. Pt noted moaning and groaning when moved around, grimacing, prn tylenol given. Pt son Danielle Bryan was called to ask admission questions. Pt is on 2L nasal cannula due to shortness of breath. Pt is currently resting, snoring. No distress noted.   Problem: Education: Goal: Knowledge of General Education information will improve Description: Including pain rating scale, medication(s)/side effects and non-pharmacologic comfort measures Outcome: Progressing   Problem: Health Behavior/Discharge Planning: Goal: Ability to manage health-related needs will improve Outcome: Progressing   Problem: Clinical Measurements: Goal: Ability to maintain clinical measurements within normal limits will improve Outcome: Progressing Goal: Will remain free from infection Outcome: Progressing Goal: Diagnostic test results will improve Outcome: Progressing Goal: Respiratory complications will improve Outcome: Progressing Goal: Cardiovascular complication will be avoided Outcome: Progressing   Problem: Activity: Goal: Risk for activity intolerance will decrease Outcome: Progressing   Problem: Nutrition: Goal: Adequate nutrition will be maintained Outcome: Progressing   Problem: Coping: Goal: Level of anxiety will decrease Outcome: Progressing   Problem: Elimination: Goal: Will not experience complications related to bowel motility Outcome: Progressing Goal: Will not experience complications related to urinary retention Outcome: Progressing   Problem: Pain Managment: Goal: General experience of comfort will improve Outcome: Progressing   Problem: Safety: Goal: Ability to remain free from injury will improve Outcome: Progressing   Problem: Skin Integrity: Goal: Risk for impaired skin integrity will decrease Outcome: Progressing

## 2021-03-24 DIAGNOSIS — Z515 Encounter for palliative care: Secondary | ICD-10-CM | POA: Diagnosis not present

## 2021-03-24 DIAGNOSIS — G9341 Metabolic encephalopathy: Secondary | ICD-10-CM | POA: Diagnosis not present

## 2021-03-24 LAB — CBC WITH DIFFERENTIAL/PLATELET
Abs Immature Granulocytes: 0.06 10*3/uL (ref 0.00–0.07)
Basophils Absolute: 0 10*3/uL (ref 0.0–0.1)
Basophils Relative: 0 %
Eosinophils Absolute: 0 10*3/uL (ref 0.0–0.5)
Eosinophils Relative: 0 %
HCT: 32.9 % — ABNORMAL LOW (ref 36.0–46.0)
Hemoglobin: 10.4 g/dL — ABNORMAL LOW (ref 12.0–15.0)
Immature Granulocytes: 1 %
Lymphocytes Relative: 5 %
Lymphs Abs: 0.6 10*3/uL — ABNORMAL LOW (ref 0.7–4.0)
MCH: 28.6 pg (ref 26.0–34.0)
MCHC: 31.6 g/dL (ref 30.0–36.0)
MCV: 90.4 fL (ref 80.0–100.0)
Monocytes Absolute: 0.4 10*3/uL (ref 0.1–1.0)
Monocytes Relative: 3 %
Neutro Abs: 9.9 10*3/uL — ABNORMAL HIGH (ref 1.7–7.7)
Neutrophils Relative %: 91 %
Platelets: 117 10*3/uL — ABNORMAL LOW (ref 150–400)
RBC: 3.64 MIL/uL — ABNORMAL LOW (ref 3.87–5.11)
RDW: 14.1 % (ref 11.5–15.5)
WBC: 10.9 10*3/uL — ABNORMAL HIGH (ref 4.0–10.5)
nRBC: 0 % (ref 0.0–0.2)

## 2021-03-24 LAB — BASIC METABOLIC PANEL
Anion gap: 6 (ref 5–15)
BUN: 15 mg/dL (ref 8–23)
CO2: 27 mmol/L (ref 22–32)
Calcium: 8.2 mg/dL — ABNORMAL LOW (ref 8.9–10.3)
Chloride: 100 mmol/L (ref 98–111)
Creatinine, Ser: 0.58 mg/dL (ref 0.44–1.00)
GFR, Estimated: >60 mL/min (ref >60)
Glucose, Bld: 142 mg/dL — ABNORMAL HIGH (ref 70–99)
Potassium: 3.8 mmol/L (ref 3.5–5.1)
Sodium: 133 mmol/L — ABNORMAL LOW (ref 135–145)

## 2021-03-24 MED ORDER — CLOPIDOGREL BISULFATE 75 MG PO TABS
75.0000 mg | ORAL_TABLET | Freq: Every morning | ORAL | Status: DC
  Administered 2021-03-24 – 2021-03-28 (×5): 75 mg via ORAL
  Filled 2021-03-24 (×5): qty 1

## 2021-03-24 MED ORDER — LEVOTHYROXINE SODIUM 25 MCG PO TABS
50.0000 ug | ORAL_TABLET | ORAL | Status: DC
  Administered 2021-03-25 – 2021-03-28 (×4): 50 ug via ORAL
  Filled 2021-03-24 (×5): qty 2

## 2021-03-24 MED ORDER — PANTOPRAZOLE 2 MG/ML SUSPENSION
40.0000 mg | Freq: Every evening | ORAL | Status: DC
  Administered 2021-03-24 – 2021-03-28 (×5): 40 mg via ORAL
  Filled 2021-03-24 (×6): qty 20

## 2021-03-24 MED ORDER — AMLODIPINE BESYLATE 5 MG PO TABS
5.0000 mg | ORAL_TABLET | Freq: Every morning | ORAL | Status: DC
  Administered 2021-03-24 – 2021-03-26 (×3): 5 mg via ORAL
  Filled 2021-03-24 (×3): qty 1

## 2021-03-24 MED ORDER — LACTATED RINGERS IV BOLUS
500.0000 mL | Freq: Once | INTRAVENOUS | Status: AC
  Administered 2021-03-24: 500 mL via INTRAVENOUS

## 2021-03-24 NOTE — Evaluation (Signed)
Physical Therapy Evaluation Patient Details Name: Danielle Bryan MRN: [id number] DOB: [birth year] Today's Date: 03/24/2021  History of Present Illness  Patient is a 88 year old female who presented here with altered mental status from assisted living facility, pneumonia; with history of hypertension, hypothyroidism, TIA, recent COVID infection, chronic diastolic congestive heart failure  Clinical Impression   Pt admitted with above diagnosis. Lives at ALF, where she needs assistance with most ADLs; uses a RW for ambulation (not clear her typical distance -- much of PLOF gleaned form chart review); noted  pt had a hospitalization and post-acute rehab stay during the latter part of last year, and success fully rehabbed back to her ALF; Presents to PT with generalized weakness, functional dependencies for mobility and ADLs; Required Mod assist overall for bed mobility and basic stand pivot transfer; Seemed to enjoy sitting up and OOB; Pt currently with functional limitations due to the deficits listed below (see PT Problem List). Pt will benefit from skilled PT to increase their independence and safety with mobility to allow discharge to the venue listed below.    Noting Palliative Care Team on board, and will follow their lead re: [place] for dc plan; Ms. Holladay was able to participate, and seemed to like being up and OOB;   If staff at [facility] can meet her needs, and pt and family want her to return to her familiar living situation, back to [facility] is reasonable, and would be my favored recommendation;   She also had a relatively recent and successful short-term rehab stay, and if [facility] can't meet her mobility and ADL needs, a SNF stay is reasonable          Recommendations for follow up therapy are one component of a multi-disciplinary discharge planning process, led by the attending physician.  Recommendations may be updated based on patient status, additional functional criteria and insurance  authorization.  Follow Up Recommendations HHPT (at ALF; if slow progress, or GOC dictates otherwise, will consider SNF)    Assistance Recommended at Discharge Frequent or constant Supervision/Assistance  Patient can return home with the following  A lot of help with walking and/or transfers;A lot of help with bathing/dressing/bathroom    Equipment Recommendations Wheelchair (measurements PT);Wheelchair cushion (measurements PT);Hospital bed (consider hospital bed)  Recommendations for Other Services  OT consult (will order per protocol)    Functional Status Assessment Patient has had a recent decline in their functional status and demonstrates the ability to make significant improvements in function in a reasonable and predictable amount of time.     Precautions / Restrictions Precautions Precautions: Fall Restrictions Weight Bearing Restrictions: No      Mobility  Bed Mobility Overal bed mobility: Needs Assistance Bed Mobility: Supine to Sit     Supine to sit: Mod assist     General bed mobility comments: Followed commands related to getting up with incr time, able to move LEs towards EOB slowly in prep for getting up; Heavy mod assist to elevate trunk to sit and square off hips at EOB    Transfers Overall transfer level: Needs assistance Equipment used: 1 person hand held assist (Bilateral support at gait belt) Transfers: Sit to/from Stand, Bed to chair/wheelchair/BSC Sit to Stand: Mod assist Stand pivot transfers: Mod assist         General transfer comment: Mod assist to rise to stand; very nice push through bil LEs; Reached across chair for far armrest (with cues) to pivot    Ambulation/Gait  Stairs            Wheelchair Mobility    Modified Rankin (Stroke Patients Only)       Balance Overall balance assessment: Needs assistance Sitting-balance support: Single extremity supported, Feet supported Sitting balance-Leahy  Scale: Fair       Standing balance-Leahy Scale: Poor                               Pertinent Vitals/Pain Pain Assessment Pain Assessment: Faces Faces Pain Scale: Hurts little more    Home Living Family/patient expects to be discharged to:: Assisted living                 Home Equipment: Rolling Walker (2 wheels);Cane - single point;Wheelchair - manual Additional Comments: some information obtained from previous chart review    Prior Function Prior Level of Function : Needs assist       Physical Assist : ADLs (physical)     Mobility Comments: pt ambulates with RW       Hand Dominance        Extremity/Trunk Assessment   Upper Extremity Assessment Upper Extremity Assessment: Generalized weakness;Difficult to assess due to impaired cognition (Held a cup of water, attempted to help adjust gait belt length when asked to; R hand and wrist painful due to IV in dorsum of wrist)    Lower Extremity Assessment Lower Extremity Assessment: Generalized weakness       Communication   Communication: Expressive difficulties  Cognition Arousal/Alertness: Awake/alert Behavior During Therapy: WFL for tasks assessed/performed (Tearful at times) Overall Cognitive Status: No family/caregiver present to determine baseline cognitive functioning Area of Impairment: Orientation, Memory, Following commands, Problem solving                 Orientation Level: Disoriented to, Time, Situation   Memory: Decreased short-term memory Following Commands: Follows one step commands with increased time     Problem Solving: Requires verbal cues, Requires tactile cues, Difficulty sequencing General Comments: Did not say much during session; when asked if she likes music, she answered "classical"; when asked how she feels, she began to cry briefly        General Comments General comments (skin integrity, edema, etc.): When put classical music on her room's computer, pt  stated, "It's wonderful"; Session conducted on room air and O2 sats stayed greater than or equal to 93%    Exercises     Assessment/Plan    PT Assessment Patient needs continued PT services  PT Problem List Decreased strength;Decreased range of motion;Decreased activity tolerance;Decreased balance;Decreased mobility;Decreased coordination;Decreased cognition;Decreased knowledge of use of DME;Decreased knowledge of precautions;Decreased safety awareness       PT Treatment Interventions DME instruction;Gait training;Functional mobility training;Therapeutic activities;Therapeutic exercise;Balance training;Neuromuscular re-education;Cognitive remediation;Patient/family education    PT Goals (Current goals can be found in the Care Plan section)  Acute Rehab PT Goals Patient Stated Goal: Did not state, but agreeable to getting OOB PT Goal Formulation: Patient unable to participate in goal setting Time For Goal Achievement: 04/07/21 Potential to Achieve Goals: Fair    Frequency Min 3X/week     Co-evaluation PT/OT/SLP Co-Evaluation/Treatment: Yes Reason for Co-Treatment: To address functional/ADL transfers;For patient/therapist safety PT goals addressed during session: Mobility/safety with mobility;Strengthening/ROM   SLP goals addressed during session: Swallowing     AM-PAC PT "6 Clicks" Mobility  Outcome Measure Help needed turning from your back to your side while in a flat bed without  using bedrails?: A Lot Help needed moving from lying on your back to sitting on the side of a flat bed without using bedrails?: A Lot Help needed moving to and from a bed to a chair (including a wheelchair)?: A Lot Help needed standing up from a chair using your arms (e.g., wheelchair or bedside chair)?: A Lot Help needed to walk in hospital room?: Total Help needed climbing 3-5 steps with a railing? : Total 6 Click Score: 10    End of Session Equipment Utilized During Treatment: Gait  belt Activity Tolerance: Patient tolerated treatment well Patient left: in chair;with chair alarm set;Other (comment) (continuing work with SLP) Nurse Communication: Mobility status;Other (comment) (O2 sats) PT Visit Diagnosis: Unsteadiness on feet (R26.81);Other abnormalities of gait and mobility (R26.89);Muscle weakness (generalized) (M62.81)    Time: 7628-3151 PT Time Calculation (min) (ACUTE ONLY): 33 min   Charges:   PT Evaluation $PT Eval Moderate Complexity: 1 Mod          Roney Marion, PT  Acute Rehabilitation Services Pager [phone] Office [phone]   Colletta [place] 03/24/2021, 1:23 PM

## 2021-03-24 NOTE — TOC Initial Note (Signed)
Transition of Care [facility]) - Initial/Assessment Note    Patient Details  Name: Danielle Bryan MRN: [id number] Date of Birth: 24-Dec-1923  Transition of Care [facility]) CM/SW Contact:    Carles Collet, RN Phone Number: 03/24/2021, 8:42 AM  Clinical Narrative:              Patient admitted from [facility]F with AMS, PNA.  Was DC'd to [facility] on 11/23 for 20 days then started with [facility] which is currently showing as active (unverified with liaison).  Palliative following. Continue treatment plan for PNA to continue.  Decision Maker: [name] (son) [phone]   Expected Discharge Plan:  (TBD) Barriers to Discharge: Continued Medical Work up   Patient Goals and CMS Choice        Expected Discharge Plan and Services Expected Discharge Plan:  (TBD)   Discharge Planning Services: CM Consult   Living arrangements for the past 2 months: Apartment                                      Prior Living Arrangements/Services Living arrangements for the past 2 months: Apartment Lives with:: Self                   Activities of Daily Living Home Assistive Devices/Equipment: Wheelchair ADL Screening (condition at time of admission) Patient's cognitive ability adequate to safely complete daily activities?: No Is the patient deaf or have difficulty hearing?: Yes Does the patient have difficulty seeing, even when wearing glasses/contacts?: Yes Does the patient have difficulty concentrating, remembering, or making decisions?: Yes Patient able to express need for assistance with ADLs?: No Does the patient have difficulty dressing or bathing?: Yes Independently performs ADLs?: No Communication: Dependent Dressing (OT): Dependent Is this a change from baseline?: Change from baseline, expected to last <3days Grooming: Dependent Is this a change from baseline?: Change from baseline, expected to last <3 days Feeding: Dependent Bathing: Dependent Is this a change  from baseline?: Change from baseline, expected to last <3 days Toileting: Dependent Is this a change from baseline?: Change from baseline, expected to last <3 days In/Out Bed: Dependent Is this a change from baseline?: Change from baseline, expected to last <3 days Walks in Home: Dependent Is this a change from baseline?: Change from baseline, expected to last <3 days Does the patient have difficulty walking or climbing stairs?: No Weakness of Legs: Both Weakness of Arms/Hands: Both  Permission Sought/Granted                  Emotional Assessment              Admission diagnosis:  Acute metabolic encephalopathy [L89.21] Patient Active Problem List   Diagnosis Date Noted   Severe sepsis ([place]) 03/22/2021   Community acquired pneumonia 03/22/2021   Chronic diastolic CHF (congestive heart failure) ([place]) 03/22/2021   GERD (gastroesophageal reflux disease) 03/22/2021   Depression 03/22/2021   Pressure injury of skin 01/13/2021   Fall 01/12/2021   Nasal bone fractures 01/12/2021   Pain due to onychomycosis of toenails of both feet 05/04/2019   Coagulation defect ([place]) 05/04/2019   Vertigo 01/27/2019   CVA (cerebral vascular accident) ([place]) 01/27/2019   Aphasia 03/20/2016   HTN (hypertension) 03/20/2016   Weakness 03/18/2015   Acute kidney injury ([place]) 03/18/2015   Dehydration 03/18/2015   Nausea and vomiting 03/18/2015   Thrombocytopenia ([place]) 03/18/2015  Moderate nausea and vomiting 03/18/2015   Elevated lactic acid level 03/18/2015   Elevated troponin 24/58/0998   Acute metabolic encephalopathy 33/82/5053   UTI (urinary tract infection) 08/22/2014   TIA (transient ischemic attack) 03/19/2014   Benign hypertensive heart disease without heart failure 08/20/2010   Hypothyroidism    Osteoarthritis    Hypercholesterolemia    Reactive depression (situational)    PCP:  Velna Hatchet, MD Pharmacy:   [id number] [place], [place] [address] [place] [address] Phone: [phone] Fax: [phone]  [facility] [place], [place] [address] Phone: [phone] Fax: [phone]  [facility] [place], [place] [address] Phone: [phone] Fax: [phone]     Social Determinants of Health (SDOH) Interventions    Readmission Risk Interventions No flowsheet data found.

## 2021-03-24 NOTE — Evaluation (Signed)
Occupational Therapy Evaluation Patient Details Name: Danielle Bryan MRN: [id number] DOB: [birth year] Today's Date: 03/24/2021   History of Present Illness Patient is a 88 year old female who presented here with altered mental status from assisted living facility, pneumonia; with history of hypertension, hypothyroidism, TIA, recent COVID infection, chronic diastolic congestive heart failure   Clinical Impression   Danielle Bryan was residing at [facility] where she ambulates with a RW; PLOF taken from chart and needs to be reviewed with pt's family for accuracy. Pt was self feeding while supported in bed upon arrival, she ultimately required min A to self feed at bed level and significantly increased time. Pt appropriately said "yes/no" throughout, but otherwise did not communicate much. Pt required min A for bed mobility with verbal cues, and once sitting pt because tearful, pain? Pt had R lateral lean this session, and needed max A for sit<>stand. Overall she is limited by generalized weakness, poor activity tolerance, impaired communication and pain. Pt will benefit from OT acutely. At this time, recommend pt d/c back to [facility] with increased direct phthisical and cognitive assist and HHOT. If her ALF is unable to support her current level of assist, pt will likely need Anf.      Recommendations for follow up therapy are one component of a multi-disciplinary discharge planning process, led by the attending physician.  Recommendations may be updated based on patient status, additional functional criteria and insurance authorization.   Follow Up Recommendations  Home health OT (Therapies following palliative direction. Recommending pt d/c back to [facility] if they are able to support her level of care with [facility])    Assistance Recommended at Discharge Frequent or constant Supervision/Assistance  Patient can return home with the following A lot of help with bathing/dressing/bathroom    Functional  Status Assessment  Patient has had a recent decline in their functional status and demonstrates the ability to make significant improvements in function in a reasonable and predictable amount of time.  Equipment Recommendations  Other (comment) (defer)       Precautions / Restrictions Precautions Precautions: Fall Restrictions Weight Bearing Restrictions: No      Mobility Bed Mobility Overal bed mobility: Needs Assistance Bed Mobility: Rolling, Sidelying to Sit, Sit to Supine Rolling: Min assist Sidelying to sit: Min assist   Sit to supine: Min assist   General bed mobility comments: followed commands for sequencing OOB. slow to move but ultimately required min A for LE management    Transfers Overall transfer level: Needs assistance Equipment used: 1 person hand held assist Transfers: Sit to/from Stand Sit to Stand: Max assist           General transfer comment: Pt seemingly fatigued this session, only tolerated short sit<>stand      Balance Overall balance assessment: Needs assistance Sitting-balance support: Single extremity supported, Feet supported Sitting balance-Leahy Scale: Poor Sitting balance - Comments: Pt had R lateral lean this session, required min-mod A for midline posture     Standing balance-Leahy Scale: Poor                             ADL either performed or assessed with clinical judgement   ADL Overall ADL's : Needs assistance/impaired Eating/Feeding: Minimal assistance;Bed level Eating/Feeding Details (indicate cue type and reason): assist for inital positioning of spoon. benefits from elbow support Grooming: Minimal assistance;Bed level   Upper Body Bathing: Moderate assistance;Sitting   Lower Body Bathing: Maximal assistance;Sit to/from stand  Upper Body Dressing : Moderate assistance;Sitting   Lower Body Dressing: Maximal assistance;Sit to/from stand   Toilet Transfer: Maximal assistance;Stand-pivot;BSC/3in1    Toileting- Clothing Manipulation and Hygiene: Maximal assistance       Functional mobility during ADLs: Maximal assistance (for OOB; min A for bed mobility) General ADL Comments: pt requires increased time and effort for all tasks assessed with fair command following. Pt moves well at the bed level, requires max A for all OOB, would benefit from +2 for safety     Vision Baseline Vision/History: 1 Wears glasses Ability to See in Adequate Light: 0 Adequate Patient Visual Report: No change from baseline Vision Assessment?: No apparent visual deficits            Pertinent Vitals/Pain Pain Assessment Pain Assessment: Faces Faces Pain Scale: Hurts a little bit Pain Location: R hand, shoulder? pt tearful once sitting Pain Descriptors / Indicators: Grimacing, Discomfort, Crying Pain Intervention(s): Limited activity within patient's tolerance, Monitored during session     Hand Dominance     Extremity/Trunk Assessment Upper Extremity Assessment Upper Extremity Assessment: RUE deficits/detail;LUE deficits/detail RUE Deficits / Details: pt self feeding with spoon upon arrival with 90% accuracy. limited over head ROM. generalized weakness. pain at IV site with hematoma present RUE Coordination: decreased gross motor LUE Deficits / Details: ROM is WFL, globally weak LUE Coordination: decreased gross motor   Lower Extremity Assessment Lower Extremity Assessment: Defer to PT evaluation   Cervical / Trunk Assessment Cervical / Trunk Assessment: Kyphotic   Communication Communication Communication: Expressive difficulties   Cognition Arousal/Alertness: Awake/alert Behavior During Therapy: WFL for tasks assessed/performed Overall Cognitive Status: No family/caregiver present to determine baseline cognitive functioning           General Comments: Difficult to fully assess due to impaired cognition. Pt not verbalizing this session with the exception of simple yes/no questions and  becoming tearful onces sitting EOB.     General Comments  VSS on RA, pt ate entire yogurt cup with min A overall     Home Living Family/patient expects to be discharged to:: Assisted living         Home Equipment: Rolling Walker (2 wheels);Cane - single point;Wheelchair - manual   Additional Comments: some information obtained from previous chart review      Prior Functioning/Environment Prior Level of Function : Needs assist       Physical Assist : ADLs (physical)     Mobility Comments: pt ambulates with RW          OT Problem List: Decreased strength;Decreased range of motion;Decreased activity tolerance;Impaired balance (sitting and/or standing);Decreased cognition;Decreased safety awareness;Decreased knowledge of precautions      OT Treatment/Interventions: Self-care/ADL training;Neuromuscular education;DME and/or AE instruction;Balance training;Patient/family education;Therapeutic activities    OT Goals(Current goals can be found in the care plan section) Acute Rehab OT Goals Patient Stated Goal: did not state OT Goal Formulation: With patient Time For Goal Achievement: 04/07/21 Potential to Achieve Goals: Good ADL Goals Pt Will Perform Grooming: with set-up;sitting Pt Will Perform Upper Body Bathing: with set-up;sitting Pt Will Perform Upper Body Dressing: with set-up;sitting Pt Will Transfer to Toilet: with min assist;ambulating  OT Frequency: Min 2X/week       AM-PAC OT "6 Clicks" Daily Activity     Outcome Measure Help from another person eating meals?: A Little Help from another person taking care of personal grooming?: A Little Help from another person toileting, which includes using toliet, bedpan, or urinal?: A Lot Help from another  person bathing (including washing, rinsing, drying)?: A Lot Help from another person to put on and taking off regular upper body clothing?: A Lot Help from another person to put on and taking off regular lower body  clothing?: A Lot 6 Click Score: 14   End of Session Equipment Utilized During Treatment: Gait belt Nurse Communication: Mobility status  Activity Tolerance: Patient tolerated treatment well Patient left: with call bell/phone within reach;with bed alarm set;in bed  OT Visit Diagnosis: Unsteadiness on feet (R26.81);Other abnormalities of gait and mobility (R26.89);Muscle weakness (generalized) (M62.81)                Time: 0177-9390 OT Time Calculation (min): 27 min Charges:  OT General Charges $OT Visit: 1 Visit OT Evaluation $OT Eval Moderate Complexity: 1 Mod OT Treatments $Self Care/Home Management : 8-22 mins   Demetrice Combes A Montavis Schubring 03/24/2021, 3:03 PM

## 2021-03-24 NOTE — Plan of Care (Signed)
°  Problem: Education: Goal: Knowledge of General Education information will improve Description: Including pain rating scale, medication(s)/side effects and non-pharmacologic comfort measures Outcome: Progressing   Problem: Health Behavior/Discharge Planning: Goal: Ability to manage health-related needs will improve Outcome: Progressing   Problem: Clinical Measurements: Goal: Ability to maintain clinical measurements within normal limits will improve Outcome: Progressing Goal: Will remain free from infection Outcome: Progressing Goal: Diagnostic test results will improve Outcome: Progressing Goal: Respiratory complications will improve Outcome: Progressing Goal: Cardiovascular complication will be avoided Outcome: Progressing   Problem: Activity: Goal: Risk for activity intolerance will decrease Outcome: Progressing   Problem: Nutrition: Goal: Adequate nutrition will be maintained Outcome: Progressing   Problem: Coping: Goal: Level of anxiety will decrease Outcome: Progressing   Problem: Elimination: Goal: Will not experience complications related to bowel motility Outcome: Progressing Goal: Will not experience complications related to urinary retention Outcome: Progressing   Problem: Pain Managment: Goal: General experience of comfort will improve Outcome: Progressing   Problem: Safety: Goal: Ability to remain free from injury will improve Outcome: Progressing   Problem: Skin Integrity: Goal: Risk for impaired skin integrity will decrease Outcome: Progressing   Problem: Skin Integrity: Goal: Risk for impaired skin integrity will decrease Outcome: Progressing   Problem: Pain Managment: Goal: General experience of comfort will improve Outcome: Progressing

## 2021-03-24 NOTE — Plan of Care (Signed)
Pt is alert oriented x 2. Pt denies pain. Pt has voided 187mls this shift, bladder scan shows 44mls. Pt does have crackles to right lower lobe. Pt  has IV fluids going at 8mls an hour. Chotiner, MD paged to inform. Provider ordered 546ml bolus.    Problem: Education: Goal: Knowledge of General Education information will improve Description: Including pain rating scale, medication(s)/side effects and non-pharmacologic comfort measures Outcome: Progressing   Problem: Health Behavior/Discharge Planning: Goal: Ability to manage health-related needs will improve Outcome: Progressing   Problem: Clinical Measurements: Goal: Ability to maintain clinical measurements within normal limits will improve Outcome: Progressing Goal: Will remain free from infection Outcome: Progressing Goal: Diagnostic test results will improve Outcome: Progressing Goal: Respiratory complications will improve Outcome: Progressing Goal: Cardiovascular complication will be avoided Outcome: Progressing   Problem: Activity: Goal: Risk for activity intolerance will decrease Outcome: Progressing   Problem: Nutrition: Goal: Adequate nutrition will be maintained Outcome: Progressing   Problem: Coping: Goal: Level of anxiety will decrease Outcome: Progressing   Problem: Elimination: Goal: Will not experience complications related to bowel motility Outcome: Progressing Goal: Will not experience complications related to urinary retention Outcome: Progressing   Problem: Pain Managment: Goal: General experience of comfort will improve Outcome: Progressing   Problem: Safety: Goal: Ability to remain free from injury will improve Outcome: Progressing   Problem: Skin Integrity: Goal: Risk for impaired skin integrity will decrease Outcome: Progressing

## 2021-03-24 NOTE — Progress Notes (Signed)
Notified by RN that pt with only 100 ml of urine output overnight. Bladder scan shows 75 ml.  Given LR bolus of 500 ml at 243ml/hr. Monitor urine output

## 2021-03-24 NOTE — Progress Notes (Addendum)
Palliative Medicine Inpatient Follow Up Note  HPI:  Per intake H&P --> Patient is a 88 year old female with history of hypertension, hypothyroidism, TIA, recent COVID infection, chronic diastolic congestive heart failure who presented here with altered mental status from assisted living facility.     Palliative care saw Danielle Bryan in November of 2022 she was made a DNR at that time and Palliative/Hospice services were discussed though Danielle Bryan (son) was nor ready for these at that time.    On this hospitalization Palliative care has been asked to get involved in the setting of pneumonia to further identify goals moving forward.  Today's Discussion (03/24/2021):  *Please note that this is a verbal dictation therefore any spelling or grammatical errors are due to the "[employer]" system interpretation.  Chart reviewed inclusive of vital signs, progress notes, laboratory results, and diagnostic images.   I met at bedside this morning with Danielle Bryan. She was fairly sleepy though did awaken and respond when name was called.   Per RN, Danielle Bryan she had been alert much of the night per night care team.  I shared that I would be back later to reassess. ___________________________________ Addendum:  I went by Danielle Bryan room this afternoon. She was awake and siting in the bed. I provided some juice via a syringe which she took quite well. Per nursing she had been OOB to the chair but was max assistance.  Discussed that patient appears alert enough to participate in a speech evaluation at this time. I was able to reach out to speak therapy who shared the would evaluate this afternoon.  Overall, as of today Danielle Bryan does seem to be improving on present measures of care.   Attempted to call patients son, Danielle Bryan though got his VM will continue to call in the oncoming days.  ________________________________________ Addendum #2:  I spoke to Danielle Bryan this evening and provided him with a brief update from a  Palliative perspective.   Questions and concerns addressed   Palliative Support Provided  Objective Assessment: Vital Signs Vitals:   03/24/21 0754 03/24/21 1125  BP: (!) 147/66 (!) 149/75  Pulse: 94 96  Resp: 18 20  Temp: 98.3 F (36.8 C) 99.6 F (37.6 C)  SpO2: 100% 99%    Intake/Output Summary (Last 24 hours) at 03/24/2021 1343 Last data filed at 03/24/2021 0700 Gross per 24 hour  Intake 1879.09 ml  Output 230 ml  Net 1649.09 ml   Last Weight  Most recent update: 03/24/2021  4:38 AM    Weight  56.1 kg (123 lb 10.9 oz)            Gen:  Frail elderly F in mild distress HEENT: moist mucous membranes CV: Irregular rate and rhythm  PULM: On RA, breathing Is even and non-labored ABD: soft/non-tender  EXT: No edema  Neuro: Pleasantly disoriented   SUMMARY OF RECOMMENDATIONS   DNAR/DNI   Continue current treatments to see if improvements can be made   If patient further declines or neglects to improve additional conversations on comfort focused care will be held   Goals: Patients son, Danielle Bryan is optimistic for improvements  Appreciate speech evaluation to complete bedside swallow   Ongoing palliative support  MDM - Moderate ______________________________________________________________________________________ Prospect Team Team Cell Phone: [phone] Please utilize secure chat with additional questions, if there is no response within 30 minutes please call the above phone number  Palliative Medicine Team providers are available by phone from 7am to 7pm daily and  can be reached through the team cell phone.  Should this patient require assistance outside of these hours, please call the patient's attending physician.

## 2021-03-24 NOTE — Progress Notes (Signed)
PROGRESS NOTE    Danielle Bryan  UEA:[id number] DOB: 1923-09-22 DOA: 03/22/2021 PCP: Velna Hatchet, MD   Chief Complain: Altered mental status  Brief Narrative: Patient is a 88 year old female with history of hypertension, hypothyroidism, TIA, recent COVID infection, chronic diastolic congestive heart failure who presented here with altered mental status from assisted living facility.  COVID screen test came out to be positive again on admission, first positive on 03/05/2021.  She initially had symptoms of rhinorrhea when she was diagnosed with covid first which subsequently improved. Over 3 days before admission here, she developed new onset cough, confusion, patient was then brought to the emergency department.  She was recently admitted here from 01/12/2021 to 01/16/2021 for the management of nondisplaced nasal bone fracture.  On presentation, she was tachycardic but saturating fine on room air.  Lab work showed elevated leukocytes.  Chest x-ray showed interval development of focal opacity on the right upper lobe concerning for pneumonia.  CTA did not show any acute intracranial abnormalities.  Patient was admitted for the management of pneumonia on the background of recent COVID infection.  Started on antibiotics for CAP.  Mental status has improved today.  Assessment & Plan:   Principal Problem:   Acute metabolic encephalopathy Active Problems:   Hypothyroidism   HTN (hypertension)   Severe sepsis ([place])   Community acquired pneumonia   Chronic diastolic CHF (congestive heart failure) (H[facility])   GERD (gastroesophageal reflux disease)   Depression   Suspected severe sepsis secondary to community-acquired pneumonia: Presented with new onset cough, altered mental status.  Chest x-ray showed right upper lobe opacity.  Met sepsis criteria with leukocytosis, tachycardia, tachypnea.  On 2 L of oxygen per minute Has been started on broad spectrum antibiotics with azithromycin, ceftriaxone,  vancomycin. If MRSA PCR is negative, so discontinued vancomycin.  Blood cultures no growth till date.  Currently she is afebrile, hemodynamically stable.  Follow-up sputum culture.Negative  strep pneumonia antigen in urine.  Continue flutter valve, spirometry, cough medications.  Leukocytosis has significantly improved today.  Speech therapy still recommending n.p.o. Continue gentle IV fluids.  Acute metabolic encephalopathy: Presented with 3 days history of confusion.  CT head did not show any acute intracranial abnormalities.  Monitor mental status.  Delirium precautions.  Mental status has significantly improved today.  She is oriented to place  COVID infection: COVID screen test initially  came out to be positive on 03/05/21.  Still Positive.  Not on precautions  Hypertension: Currently BP stable.  Takes amlodipine which has been continued.  Hypothyroidism: Continue Synthyroid  History of TIA: On Plavix  Depression: Depakote, Zoloft.  Zoloft on hold  GERD: Continue Protonix  Chronic diastolic congestive heart failure: Last echo done in November 2022 showed EF of greater than 47%, grade 1 diastolic dysfunction.  Currently she is euvolemic.  Deconditioning/debility/generalized weakness: She lives in ALF.  We have requested  for PT/OT evaluation. Need to discuss goals of care.  Patient is DNR.  Elderly patient now with pneumonia, encephalopathy.  High risk for decompensation.  Palliative  care consulted.  Son wants to continue the management for pneumonia anticipating recovery but if she does not improve then he is open about discussing for comfort care versus hospice. Clinically she has improved today           DVT prophylaxis:SCD Code Status: DNR Family Communication: Discussed with Hulan Fess on phone on 1/28 Patient status:Inpatient  Dispo: The patient is from: ALF  Anticipated d/c is to: ALF vs SnF              Anticipated d/c date is: Awaiting further improvement  in the clinical status before discharge planning  Consultants: None  Procedures:None  Antimicrobials:  Anti-infectives (From admission, onward)    Start     Dose/Rate Route Frequency Ordered Stop   03/24/21 2130  vancomycin (VANCOREADY) IVPB 750 mg/150 mL  Status:  Discontinued        750 mg 150 mL/hr over 60 Minutes Intravenous Every 36 hours 03/22/21 2022 03/23/21 1513   03/24/21 1400  cefTRIAXone (ROCEPHIN) 2 g in sodium chloride 0.9 % 100 mL IVPB        2 g 200 mL/hr over 30 Minutes Intravenous Every 24 hours 03/23/21 1528     03/23/21 1500  azithromycin (ZITHROMAX) 500 mg in sodium chloride 0.9 % 250 mL IVPB        500 mg 250 mL/hr over 60 Minutes Intravenous Every 24 hours 03/22/21 1957     03/23/21 1500  cefTRIAXone (ROCEPHIN) 1 g in sodium chloride 0.9 % 100 mL IVPB  Status:  Discontinued        1 g 200 mL/hr over 30 Minutes Intravenous Every 24 hours 03/22/21 1957 03/23/21 1528   03/22/21 2030  vancomycin (VANCOCIN) IVPB 1000 mg/200 mL premix        1,000 mg 200 mL/hr over 60 Minutes Intravenous  Once 03/22/21 2015 03/23/21 0934   03/22/21 1730  cefTRIAXone (ROCEPHIN) 1 g in sodium chloride 0.9 % 100 mL IVPB        1 g 200 mL/hr over 30 Minutes Intravenous  Once 03/22/21 1715 03/22/21 1857   03/22/21 1730  azithromycin (ZITHROMAX) 500 mg in sodium chloride 0.9 % 250 mL IVPB        500 mg 250 mL/hr over 60 Minutes Intravenous  Once 03/22/21 1715 03/22/21 1905       Subjective:  Patient seen and examined at the bedside this morning.  Hemodynamically stable.  She was sitting in chair.  Significantly improved mental status.  Alert and awake, communicates well.  Appears very weak though.  Oriented to place not in any kind of distress.  Objective: Vitals:   03/23/21 2333 03/24/21 0346 03/24/21 0438 03/24/21 0754  BP: (!) 144/68 (!) 143/58  (!) 147/66  Pulse: 91 97  94  Resp: 20 (!) 24  18  Temp: 99.1 F (37.3 C) 97.7 F (36.5 C)  98.3 F (36.8 C)  TempSrc: Axillary  Oral  Oral  SpO2: 100% 100%  100%  Weight:   56.1 kg   Height:        Intake/Output Summary (Last 24 hours) at 03/24/2021 1054 Last data filed at 03/24/2021 0700 Gross per 24 hour  Intake 1879.09 ml  Output 230 ml  Net 1649.09 ml   Filed Weights   03/22/21 1609 03/23/21 0500 03/24/21 0438  Weight: 53 kg 55.9 kg 56.1 kg    Examination:   General exam: Very deconditioned, frail, overall comfortable, sitting in the chair HEENT: PERRL Respiratory system: Diminished air sounds bilaterally, no wheezes or crackles  Cardiovascular system: S1 & S2 heard, RRR.  Gastrointestinal system: Abdomen is nondistended, soft and nontender. Central nervous system: Alert and awake, oriented to place Extremities: No edema, no clubbing ,no cyanosis Skin: No rashes, no ulcers,no icterus     Data Reviewed: I have personally reviewed following labs and imaging studies  CBC: Recent Labs  Lab 03/22/21 1751 03/23/21  0138 03/24/21 0126  WBC 20.2* 23.3* 10.9*  NEUTROABS 15.5* 19.1* 9.9*  HGB 12.7 12.0 10.4*  HCT 38.5 37.7 32.9*  MCV 88.7 89.5 90.4  PLT 161 138* 010*   Basic Metabolic Panel: Recent Labs  Lab 03/22/21 1751 03/23/21 0138 03/24/21 0126  NA 133* 131* 133*  K 3.8 4.1 3.8  CL 96* 96* 100  CO2 $Re'25 24 27  'IDL$ GLUCOSE 110* 159* 142*  BUN $Re'11 12 15  'fZC$ CREATININE 0.64 0.61 0.58  CALCIUM 9.2 8.8* 8.2*  MG 1.8 1.8  --   PHOS  --  4.0  --    GFR: Estimated Creatinine Clearance: 31 mL/min (by C-G formula based on SCr of 0.58 mg/dL). Liver Function Tests: Recent Labs  Lab 03/22/21 1751 03/23/21 0138  AST 15 16  ALT 9 7  ALKPHOS 73 56  BILITOT 1.0 0.7  PROT 6.7 6.2*  ALBUMIN 3.5 3.2*   No results for input(s): LIPASE, AMYLASE in the last 168 hours. Recent Labs  Lab 03/23/21 0138  AMMONIA <10   Coagulation Profile: No results for input(s): INR, PROTIME in the last 168 hours. Cardiac Enzymes: Recent Labs  Lab 03/22/21 2000  CKTOTAL 34*   BNP (last 3 results) No results  for input(s): PROBNP in the last 8760 hours. HbA1C: No results for input(s): HGBA1C in the last 72 hours. CBG: Recent Labs  Lab 03/22/21 1955  GLUCAP 123*   Lipid Profile: No results for input(s): CHOL, HDL, LDLCALC, TRIG, CHOLHDL, LDLDIRECT in the last 72 hours. Thyroid Function Tests: Recent Labs    03/22/21 1751  TSH 3.416   Anemia Panel: No results for input(s): VITAMINB12, FOLATE, FERRITIN, TIBC, IRON, RETICCTPCT in the last 72 hours. Sepsis Labs: Recent Labs  Lab 03/22/21 2000 03/22/21 2035 03/22/21 2247  PROCALCITON 0.12  --   --   LATICACIDVEN  --  1.1 1.0    Recent Results (from the past 240 hour(s))  Blood culture (routine x 2)     Status: None (Preliminary result)   Collection Time: 03/22/21  5:52 PM   Specimen: BLOOD  Result Value Ref Range Status   Specimen Description BLOOD SITE NOT SPECIFIED  Final   Special Requests   Final    BOTTLES DRAWN AEROBIC AND ANAEROBIC Blood Culture results may not be optimal due to an excessive volume of blood received in culture bottles   Culture   Final    NO GROWTH 2 DAYS Performed at [facility], [place] [address] [place], [place] [address]    Report Status PENDING  Incomplete  Blood culture (routine x 2)     Status: None (Preliminary result)   Collection Time: 03/22/21  5:53 PM   Specimen: BLOOD  Result Value Ref Range Status   Specimen Description BLOOD SITE NOT SPECIFIED  Final   Special Requests   Final    BOTTLES DRAWN AEROBIC AND ANAEROBIC Blood Culture adequate volume   Culture   Final    NO GROWTH 2 DAYS Performed at [facility], [place] [address], [place], [place] [address]    Report Status PENDING  Incomplete  Resp Panel by RT-PCR (Flu A&B, Covid) Nasopharyngeal Swab     Status: Abnormal   Collection Time: 03/22/21  8:44 PM   Specimen: Nasopharyngeal Swab; Nasopharyngeal(NP) swabs in vial transport medium  Result Value Ref Range Status   SARS Coronavirus 2 by RT PCR POSITIVE (A) NEGATIVE Final     Comment: (NOTE) SARS-CoV-2 target nucleic acids are DETECTED.  The SARS-CoV-2 RNA is generally detectable in upper respiratory specimens during the acute phase of infection. Positive results are indicative of the presence of the identified virus, but do not rule out bacterial infection or co-infection with other pathogens not detected by the test. Clinical correlation with patient history and other diagnostic information is necessary to determine patient infection status. The expected result is Negative.  Fact Sheet for Patients: EntrepreneurPulse.com.au  Fact Sheet for Healthcare Providers: IncredibleEmployment.be  This test is not yet approved or cleared by the [place] FDA and  has been authorized for detection and/or diagnosis of SARS-CoV-2 by FDA under an Emergency Use Authorization (EUA).  This EUA will remain in effect (meaning this test can be used) for the duration of  the COVID-19 declaration under Section 564(b)(1) of the A ct, 21 U.S.C. section 360bbb-3(b)(1), unless the authorization is terminated or revoked sooner.     Influenza A by PCR NEGATIVE NEGATIVE Final   Influenza B by PCR NEGATIVE NEGATIVE Final    Comment: (NOTE) The Xpert Xpress SARS-CoV-2/FLU/RSV plus assay is intended as an aid in the diagnosis of influenza from Nasopharyngeal swab specimens and should not be used as a sole basis for treatment. Nasal washings and aspirates are unacceptable for Xpert Xpress SARS-CoV-2/FLU/RSV testing.  Fact Sheet for Patients: EntrepreneurPulse.com.au  Fact Sheet for Healthcare Providers: IncredibleEmployment.be  This test is not yet approved or cleared by the [place] FDA and has been authorized for detection and/or diagnosis of SARS-CoV-2 by FDA under an Emergency Use Authorization (EUA). This EUA will remain in effect (meaning this test can be used) for the duration of  the COVID-19 declaration under Section 564(b)(1) of the Act, 21 U.S.C. section 360bbb-3(b)(1), unless the authorization is terminated or revoked.  Performed at [facility], [place] [address] [place], [place] [address]   MRSA Next Gen by PCR, Nasal     Status: None   Collection Time: 03/23/21  9:02 AM   Specimen: Nasal Mucosa; Nasal Swab  Result Value Ref Range Status   MRSA by PCR Next Gen NOT DETECTED NOT DETECTED Final    Comment: (NOTE) The GeneXpert MRSA Assay (FDA approved for NASAL specimens only), is one component of a comprehensive MRSA colonization surveillance program. It is not intended to diagnose MRSA infection nor to guide or monitor treatment for MRSA infections. Test performance is not FDA approved in patients less than 79 years old. Performed at [facility], [place] [address], [place], [place] [address]          Radiology Studies: CT HEAD WO CONTRAST (5MM)  Result Date: 03/22/2021 CLINICAL DATA:  Altered mental status. EXAM: CT HEAD WITHOUT CONTRAST TECHNIQUE: Contiguous axial images were obtained from the base of the skull through the vertex without intravenous contrast. RADIATION DOSE REDUCTION: This exam was performed according to the departmental dose-optimization program which includes automated exposure control, adjustment of the mA and/or kV according to patient size and/or use of iterative reconstruction technique. COMPARISON:  January 12, 2021. FINDINGS: Brain: Mild chronic ischemic white matter disease is noted. No mass effect or midline shift is noted. Ventricular size is within normal limits. There is no evidence of mass lesion, hemorrhage or acute infarction. Vascular: No hyperdense vessel or unexpected calcification. Skull: Normal. Negative for fracture or focal lesion. Sinuses/Orbits: No acute finding. Other: None. IMPRESSION: No acute intracranial abnormality seen. Electronically Signed   By: Marijo Conception M.D.   On: 03/22/2021 17:19   DG  Chest Portable  1 View  Result Date: 03/22/2021 CLINICAL DATA:  concern for pna after recent Covid19 infection EXAM: PORTABLE CHEST 1 VIEW COMPARISON:  01/12/2021 FINDINGS: Heart size within normal limits. Aortic atherosclerosis. New focal opacity within the right upper lobe. Left lung appears clear. No definite pleural fluid collection. No pneumothorax. Exaggerated thoracic kyphosis. IMPRESSION: New focal opacity within the right upper lobe suspicious for pneumonia. Radiographic follow-up to resolution is recommended. Electronically Signed   By: Davina Poke D.O.   On: 03/22/2021 16:38        Scheduled Meds:  amLODipine  5 mg Oral q morning   clopidogrel  75 mg Oral q morning   divalproex  500 mg Oral q morning   [START ON 03/25/2021] levothyroxine  50 mcg Oral Once per day on Mon Tue Wed Thu Fri Sat   pantoprazole sodium  40 mg Oral QPM   Continuous Infusions:  azithromycin 500 mg (03/23/21 1438)   cefTRIAXone (ROCEPHIN)  IV     dextrose 5 % and 0.9% NaCl 75 mL/hr at 03/24/21 0736     LOS: 2 days       Shelly Coss, MD [facility] P1/29/2023, 10:54 AM

## 2021-03-24 NOTE — Progress Notes (Signed)
Speech Language Pathology Treatment: Dysphagia  Patient Details Name: Danielle Bryan MRN: [id number] DOB: 1923/08/14 Today's Date: 03/24/2021 Time: 0900-0940 SLP Time Calculation (min) (ACUTE ONLY): 20 min  Assessment / Plan / Recommendation Clinical Impression  Patient seen by SLP with focus on dysphagia goals. PT in room as well conducting initial evaluation. After patient was assisted by PT into recliner, SLP provided oral care followed by presenting patient with cup of water. She was able to bring to lips and tilt up to mouth with a lot of time and effort. SLP also trialed with straw however she was not able to suck water effectively. She did best with Nosey cup and some assistance in initiating lifting cup up to lips. Swallow initiation appeared timely and patient exhibited only intermittent dry non productive coughing which occured immediately after swallow at times, other times was delayed. Difficult to determine if cough from penetration/aspiration as patient has had a chronic cough secondary to PNA and recent h/o COVID-19 infection. Per RN report, patient is improving medically but still only with brief periods of time during day during which she is alert enough to take some meds. SLP is recommending to continue NPO status but allow water sips, floor stock liquids and continue with necessary meds crushed in puree; only give PO's when fully awake, alert and sitting upright. SLP will continue to follow patient for dysphagia.    HPI HPI: Patient is a 88 y.o. female who is a resident at [facility] with PMH: HTN, acquired hypothyroidism,, multiple prior TIA's, recent COVID-19 infection, chronic diastolic heart failure,, who was admitted to [facility] on 1/27 with severe sepsis due community acquired PNA after presenting to [facility] ED for evaluation of AMS. CT head did not show any acute intracranial abnormalities, CT chest revealed new focal opacity within the right upper lobe suspicious for pneumonia.       SLP Plan  Continue with current plan of care      Recommendations for follow up therapy are one component of a multi-disciplinary discharge planning process, led by the attending physician.  Recommendations may be updated based on patient status, additional functional criteria and insurance authorization.    Recommendations  Diet recommendations: NPO;Other(comment) (when alert: allow water sips (green nosey cup) and floor stock liquids, meds crushed in puree) Medication Administration: Crushed with puree Supervision: Staff to assist with self feeding;Full supervision/cueing for compensatory strategies Compensations: Small sips/bites;Slow rate Postural Changes and/or Swallow Maneuvers: Seated upright 90 degrees                Oral Care Recommendations: Oral care BID;Staff/trained caregiver to provide oral care Follow Up Recommendations: Other (comment) (TBD pending progress (anticipate either SNF level or HH)) Assistance recommended at discharge: Frequent or constant Supervision/Assistance SLP Visit Diagnosis: Dysphagia, unspecified (R13.10) Plan: Continue with current plan of care         Sonia Baller, MA, CCC-SLP Speech Therapy

## 2021-03-24 NOTE — Progress Notes (Signed)
Speech Language Pathology Treatment: Dysphagia  Patient Details Name: Danielle Bryan MRN: [id number] DOB: Oct 10, 1923 Today's Date: 03/24/2021 Time: 1330-1350 SLP Time Calculation (min) (ACUTE ONLY): 20 min  Assessment / Plan / Recommendation Clinical Impression  SLP was contacted via secure Epic chat by palliative NPO and notified that patient was significantly more awake and alert early this PM. When SLP entered room, patient with eyes closed but she awoke easily to voice and tactile cues. Patient able to verbalize wants/needs but she does struggle with completing a thought. When given some choices, she chose yogurt and declined anything to drink. Patient was able to feed self yogurt, however often had difficulty fully lifting spoon into mouth. She seemed to have a loose grip on spoon (plastic). Mild anterior spillage observed however swallow initiation was timely and no significant residuals in oral cavity post swallows. SLP notified OT that patient was adequately alert and she came to see patient for OT evaluation. Patient continues to demonstrate steady progress and will continue to progress with ongoing SLP intervention. Recommendation for Dys 1 solids, thin liquids.    HPI HPI: Patient is a 88 y.o. female who is a resident at [facility] with PMH: HTN, acquired hypothyroidism,, multiple prior TIA's, recent COVID-19 infection, chronic diastolic heart failure,, who was admitted to [facility] on 1/27 with severe sepsis due community acquired PNA after presenting to [facility] ED for evaluation of AMS. CT head did not show any acute intracranial abnormalities, CT chest revealed new focal opacity within the right upper lobe suspicious for pneumonia.      SLP Plan  Continue with current plan of care      Recommendations for follow up therapy are one component of a multi-disciplinary discharge planning process, led by the attending physician.  Recommendations may be updated based on patient status, additional  functional criteria and insurance authorization.    Recommendations  Diet recommendations: Dysphagia 1 (puree);Thin liquid Liquids provided via: Cup Medication Administration: Crushed with puree Supervision: Staff to assist with self feeding;Full supervision/cueing for compensatory strategies Compensations: Small sips/bites;Slow rate Postural Changes and/or Swallow Maneuvers: Seated upright 90 degrees                Oral Care Recommendations: Oral care BID;Staff/trained caregiver to provide oral care Follow Up Recommendations: Home health SLP Assistance recommended at discharge: Frequent or constant Supervision/Assistance SLP Visit Diagnosis: Dysphagia, unspecified (R13.10) Plan: Continue with current plan of care         Sonia Baller, MA, CCC-SLP Speech Therapy

## 2021-03-25 DIAGNOSIS — Z7189 Other specified counseling: Secondary | ICD-10-CM | POA: Diagnosis not present

## 2021-03-25 DIAGNOSIS — G9341 Metabolic encephalopathy: Secondary | ICD-10-CM | POA: Diagnosis not present

## 2021-03-25 DIAGNOSIS — Z515 Encounter for palliative care: Secondary | ICD-10-CM | POA: Diagnosis not present

## 2021-03-25 LAB — METHYLMALONIC ACID, SERUM: Methylmalonic Acid, Quantitative: 81 nmol/L (ref 0–378)

## 2021-03-25 MED ORDER — DIVALPROEX SODIUM 125 MG PO CSDR
250.0000 mg | DELAYED_RELEASE_CAPSULE | Freq: Two times a day (BID) | ORAL | Status: DC
  Administered 2021-03-25 – 2021-03-28 (×8): 250 mg via ORAL
  Filled 2021-03-25 (×8): qty 2

## 2021-03-25 NOTE — Progress Notes (Signed)
° °  Palliative Medicine Inpatient Follow Up Note  HPI:  Per intake H&P --> Patient is a 88 year old female with history of hypertension, hypothyroidism, TIA, recent COVID infection, chronic diastolic congestive heart failure who presented here with altered mental status from assisted living facility.     Palliative care saw Danielle Bryan in November of 2022 she was made a DNR at that time and Palliative/Hospice services were discussed though Danielle Bryan (son) was nor ready for these at that time.    On this hospitalization Palliative care has been asked to get involved in the setting of pneumonia to further identify goals moving forward.  Today's Discussion (03/25/2021):  *Please note that this is a verbal dictation therefore any spelling or grammatical errors are due to the "[place]" system interpretation.  Chart reviewed inclusive of vital signs, progress notes, laboratory results, and diagnostic images.   I met at bedside this morning with Danielle Bryan she was alert and oriented to person and place. [name] was in good spirits and pleasant. Exemplifies no distress and does overall seem to be improving.   Reviewed with RN the plan.  Palliative Support Provided  Objective Assessment: Vital Signs Vitals:   03/25/21 0510 03/25/21 0729  BP: (!) 154/72 139/63  Pulse: 94 87  Resp: 18 16  Temp: 98.1 F (36.7 C) 98.6 F (37 C)  SpO2: 99% 100%    Intake/Output Summary (Last 24 hours) at 03/25/2021 1028 Last data filed at 03/25/2021 1011 Gross per 24 hour  Intake 2421.27 ml  Output 655 ml  Net 1766.27 ml    Last Weight  Most recent update: 03/24/2021  4:38 AM    Weight  56.1 kg (123 lb 10.9 oz)            Gen:  Frail elderly F in mild distress HEENT: moist mucous membranes CV: Irregular rate and rhythm  PULM: On RA, breathing Is even and non-labored ABD: soft/non-tender  EXT: No edema  Neuro: Pleasantly disoriented   SUMMARY OF RECOMMENDATIONS   DNAR/DNI   Continue current  treatments to see if improvements can be made --> So far appears to be doing well though if declined son would be open to comfort conversations   Goals: Patients son, Danielle Bryan is optimistic for improvements  Ongoing incremental palliative support during hospitalization  MDM - Moderate ______________________________________________________________________________________ [facility] Team Team Cell Phone: [phone] Please utilize secure chat with additional questions, if there is no response within 30 minutes please call the above phone number  Palliative Medicine Team providers are available by phone from 7am to 7pm daily and can be reached through the team cell phone.  Should this patient require assistance outside of these hours, please call the patient's attending physician.

## 2021-03-25 NOTE — Progress Notes (Signed)
Occupational Therapy Treatment Patient Details Name: Danielle Bryan MRN: [id number] DOB: [birth year] Today's Date: 03/25/2021   History of present illness Patient is a 88 year old female who presented here with altered mental status from assisted living facility, pneumonia; with history of hypertension, hypothyroidism, TIA, recent COVID infection, chronic diastolic congestive heart failure   OT comments  This 88 yo female admitted with above seen today to focus on bed mobility, sitting balance, transfers, and grooming. Pt with overall the same level of A as on eval for these activities and tearful once in chair (stating she is sad). NT made aware of transfer status and that she needs a purewick. We will continue to follow. She will need SNF if ALF cannot currently meet her needs.   Recommendations for follow up therapy are one component of a multi-disciplinary discharge planning process, led by the attending physician.  Recommendations may be updated based on patient status, additional functional criteria and insurance authorization.    Follow Up Recommendations  Skilled nursing-short term rehab (<3 hours/day) (unless ALF can provide Max-Mod A then could go back to ALF with HHOT)    Assistance Recommended at Discharge Frequent or constant Supervision/Assistance  Patient can return home with the following  A lot of help with bathing/dressing/bathroom;A lot of help with walking and/or transfers;Assistance with cooking/housework;Assistance with feeding;Help with stairs or ramp for entrance;Assist for transportation;Direct supervision/assist for financial management;Direct supervision/assist for medications management   Equipment Recommendations  Other (comment) (TBD next venue)       Precautions / Restrictions Precautions Precautions: Fall Restrictions Weight Bearing Restrictions: No       Mobility Bed Mobility Overal bed mobility: Needs Assistance Bed Mobility: Supine to Sit     Supine  to sit: Mod assist, HOB elevated     General bed mobility comments: increased time, able to get ~1/2 way up before needed A for legs and trunk    Transfers Overall transfer level: Needs assistance   Transfers: Sit to/from Stand, Bed to chair/wheelchair/BSC Sit to Stand: Max assist Stand pivot transfers: Max assist               Balance Overall balance assessment: Needs assistance Sitting-balance support: No upper extremity supported, Feet supported Sitting balance-Leahy Scale: Poor Sitting balance - Comments: Right lateral lean requiring cues and Mod phyiscal A to correct   Standing balance support: Bilateral upper extremity supported Standing balance-Leahy Scale: Zero                             ADL either performed or assessed with clinical judgement   ADL Overall ADL's : Needs assistance/impaired     Grooming: Set up;Wash/dry face;Sitting Grooming Details (indicate cue type and reason): in recliner                 Toilet Transfer: Maximal assistance;Stand-pivot Toilet Transfer Details (indicate cue type and reason): attempted use of RW, but she could not manipulate it and maintain balance; then did stand pivot with Max A                 Vision Baseline Vision/History: 1 Wears glasses Ability to See in Adequate Light: 0 Adequate Patient Visual Report: No change from baseline            Cognition Arousal/Alertness: Awake/alert Behavior During Therapy: Flat affect (overall) Overall Cognitive Status: No family/caregiver present to determine baseline cognitive functioning Area of Impairment: Following commands, Safety/judgement, Problem solving  Following Commands: Follows one step commands inconsistently Safety/Judgement: Decreased awareness of safety, Decreased awareness of deficits   Problem Solving: Requires verbal cues, Requires tactile cues, Difficulty sequencing, Slow processing General Comments: Pt  not verbalizing much. She became tearful with me once in recliner (said she was sad)--told her we are taking good care of her.                   Pertinent Vitals/ Pain       Pain Assessment Pain Assessment: No/denies pain         Frequency  Min 2X/week        Progress Toward Goals  OT Goals(current goals can now be found in the care plan section)  Progress towards OT goals: Not progressing toward goals - comment (same about of A for transfer as eval)  Acute Rehab OT Goals Patient Stated Goal: did not state, agreeable to get OOB OT Goal Formulation: With patient Time For Goal Achievement: 04/07/21 Potential to Achieve Goals: Good  Plan Discharge plan needs to be updated       AM-PAC OT "6 Clicks" Daily Activity     Outcome Measure   Help from another person eating meals?: A Little (per NT) Help from another person taking care of personal grooming?: A Little Help from another person toileting, which includes using toliet, bedpan, or urinal?: Total Help from another person bathing (including washing, rinsing, drying)?: Total Help from another person to put on and taking off regular upper body clothing?: Total Help from another person to put on and taking off regular lower body clothing?: Total 6 Click Score: 10    End of Session Equipment Utilized During Treatment: Gait belt  OT Visit Diagnosis: Unsteadiness on feet (R26.81);Other abnormalities of gait and mobility (R26.89);Muscle weakness (generalized) (M62.81)   Activity Tolerance Patient tolerated treatment well   Patient Left in chair;with call bell/phone within reach;with chair alarm set   Nurse Communication  (NT: mobility status (sara stedy +2 A), called front desk to let them know IV was finished and beeping (x2 while I was in room and it was still beeping when I left))        Time: 3329-5188 OT Time Calculation (min): 31 min  Charges: OT General Charges $OT Visit: 1 Visit OT Treatments $Self  Care/Home Management : 23-37 mins  [place], OTR/L Acute [employer] Pager [phone] Office [phone]    Almon Register 03/25/2021, 5:44 PM

## 2021-03-25 NOTE — Progress Notes (Signed)
PROGRESS NOTE    Danielle Bryan  XTG:[id number] DOB: 09/03/1923 DOA: 03/22/2021 PCP: Velna Hatchet, MD   Chief Complain: Altered mental status  Brief Narrative: Patient is a 88 year old female with history of hypertension, hypothyroidism, TIA, recent COVID infection, chronic diastolic congestive heart failure who presented here with altered mental status from assisted living facility.  COVID screen test came out to be positive again on admission, first positive on 03/05/2021.  She initially had symptoms of rhinorrhea when she was diagnosed with covid first which subsequently improved. Over 3 days before admission here, she developed new onset cough, confusion, patient was then brought to the emergency department.  She was recently admitted here from 01/12/2021 to 01/16/2021 for the management of nondisplaced nasal bone fracture.  On presentation, she was tachycardic but saturating fine on room air.  Lab work showed elevated leukocytes.  Chest x-ray showed interval development of focal opacity on the right upper lobe concerning for pneumonia.  CTA did not show any acute intracranial abnormalities.  Patient was admitted for the management of pneumonia on the background of recent COVID infection.  Started on antibiotics for CAP.  Overall status has improved.  Assessment & Plan:   Principal Problem:   Acute metabolic encephalopathy Active Problems:   Hypothyroidism   HTN (hypertension)   Severe sepsis ([facility])   Community acquired pneumonia   Chronic diastolic CHF (congestive heart failure) (HCC)   GERD (gastroesophageal reflux disease)   Depression   Suspected severe sepsis secondary to community-acquired pneumonia: Presented with new onset cough, altered mental status.  Chest x-ray showed right upper lobe opacity.  Met sepsis criteria with leukocytosis, tachycardia, tachypnea.  On 2 L of oxygen per minute Has been started on broad spectrum antibiotics with azithromycin, ceftriaxone, vancomycin.  If MRSA PCR is negative, so discontinued vancomycin.  Blood cultures no growth till date.  Currently she is afebrile, hemodynamically stable.  .Negative  strep pneumonia antigen in urine.  Continue flutter valve, spirometry, cough medications.  Leukocytosis has significantly improved .  Speech therapy recommended dys 1 diet,  Acute metabolic encephalopathy: Presented with 3 days history of confusion.  CT head did not show any acute intracranial abnormalities.  Monitor mental status.  Delirium precautions.  Mental status has significantly improved .  She is oriented to place  COVID infection: COVID screen test initially  came out to be positive on 03/05/21.  Still Positive.  Not on precautions  Hypertension: Currently BP stable.  Takes amlodipine which has been continued.  Hypothyroidism: Continue Synthyroid  History of TIA: On Plavix  Depression: Depakote, Zoloft.  Zoloft on hold  GERD: Continue Protonix  Chronic diastolic congestive heart failure: Last echo done in November 2022 showed EF of greater than 27%, grade 1 diastolic dysfunction.  Currently she is euvolemic.  Deconditioning/debility/generalized weakness: She lives in ALF.  PT/OT recommending ALF on discharge.   Patient is DNR.  Palliaitve care has been following.          DVT prophylaxis:SCD Code Status: DNR Family Communication: Discussed with Hulan Fess on phone on 1/28.Called again today,call not received Patient status:Inpatient  Dispo: The patient is from: ALF              Anticipated d/c is to: ALF               Anticipated d/c date is: Likely tomorrow  Consultants: None  Procedures:None  Antimicrobials:  Anti-infectives (From admission, onward)    Start     Dose/Rate Route Frequency Ordered  Stop   03/24/21 2130  vancomycin (VANCOREADY) IVPB 750 mg/150 mL  Status:  Discontinued        750 mg 150 mL/hr over 60 Minutes Intravenous Every 36 hours 03/22/21 2022 03/23/21 1513   03/24/21 1400  cefTRIAXone  (ROCEPHIN) 2 g in sodium chloride 0.9 % 100 mL IVPB        2 g 200 mL/hr over 30 Minutes Intravenous Every 24 hours 03/23/21 1528     03/23/21 1500  azithromycin (ZITHROMAX) 500 mg in sodium chloride 0.9 % 250 mL IVPB        500 mg 250 mL/hr over 60 Minutes Intravenous Every 24 hours 03/22/21 1957     03/23/21 1500  cefTRIAXone (ROCEPHIN) 1 g in sodium chloride 0.9 % 100 mL IVPB  Status:  Discontinued        1 g 200 mL/hr over 30 Minutes Intravenous Every 24 hours 03/22/21 1957 03/23/21 1528   03/22/21 2030  vancomycin (VANCOCIN) IVPB 1000 mg/200 mL premix        1,000 mg 200 mL/hr over 60 Minutes Intravenous  Once 03/22/21 2015 03/23/21 0934   03/22/21 1730  cefTRIAXone (ROCEPHIN) 1 g in sodium chloride 0.9 % 100 mL IVPB        1 g 200 mL/hr over 30 Minutes Intravenous  Once 03/22/21 1715 03/22/21 1857   03/22/21 1730  azithromycin (ZITHROMAX) 500 mg in sodium chloride 0.9 % 250 mL IVPB        500 mg 250 mL/hr over 60 Minutes Intravenous  Once 03/22/21 1715 03/22/21 1905       Subjective:  Patient seen and examined the bedside this morning.  Hemodynamically stable.  Not in any kind of distress.  Alert, awake, obeys commands, communicates, oriented to place.  Objective: Vitals:   03/24/21 1924 03/24/21 2325 03/25/21 0510 03/25/21 0729  BP: (!) 155/75 (!) 148/77 (!) 154/72 139/63  Pulse: (!) 106 97 94 87  Resp: (!) $RemoveB'21 20 18 16  'vOxYREOj$ Temp: 98.3 F (36.8 C) 100 F (37.8 C) 98.1 F (36.7 C) 98.6 F (37 C)  TempSrc: Oral Axillary Axillary Oral  SpO2: 95% 100% 99% 100%  Weight:      Height:        Intake/Output Summary (Last 24 hours) at 03/25/2021 1125 Last data filed at 03/25/2021 1011 Gross per 24 hour  Intake 2421.27 ml  Output 655 ml  Net 1766.27 ml   Filed Weights   03/22/21 1609 03/23/21 0500 03/24/21 0438  Weight: 53 kg 55.9 kg 56.1 kg    Examination:   General exam: Very deconditioned but overall comfortable, extremely elderly patient HEENT: PERRL Respiratory  system: Diminished air sounds bilaterally, no wheezes or crackles  Cardiovascular system: S1 & S2 heard, RRR.  Gastrointestinal system: Abdomen is nondistended, soft and nontender. Central nervous system: Alert and oriented Extremities: No edema, no clubbing ,no cyanosis Skin: No rashes, no ulcers,no icterus     Data Reviewed: I have personally reviewed following labs and imaging studies  CBC: Recent Labs  Lab 03/22/21 1751 03/23/21 0138 03/24/21 0126  WBC 20.2* 23.3* 10.9*  NEUTROABS 15.5* 19.1* 9.9*  HGB 12.7 12.0 10.4*  HCT 38.5 37.7 32.9*  MCV 88.7 89.5 90.4  PLT 161 138* 096*   Basic Metabolic Panel: Recent Labs  Lab 03/22/21 1751 03/23/21 0138 03/24/21 0126  NA 133* 131* 133*  K 3.8 4.1 3.8  CL 96* 96* 100  CO2 $Re'25 24 27  'CcO$ GLUCOSE 110* 159* 142*  BUN 11  12 15  CREATININE 0.64 0.61 0.58  CALCIUM 9.2 8.8* 8.2*  MG 1.8 1.8  --   PHOS  --  4.0  --    GFR: Estimated Creatinine Clearance: 31 mL/min (by C-G formula based on SCr of 0.58 mg/dL). Liver Function Tests: Recent Labs  Lab 03/22/21 1751 03/23/21 0138  AST 15 16  ALT 9 7  ALKPHOS 73 56  BILITOT 1.0 0.7  PROT 6.7 6.2*  ALBUMIN 3.5 3.2*   No results for input(s): LIPASE, AMYLASE in the last 168 hours. Recent Labs  Lab 03/23/21 0138  AMMONIA <10   Coagulation Profile: No results for input(s): INR, PROTIME in the last 168 hours. Cardiac Enzymes: Recent Labs  Lab 03/22/21 2000  CKTOTAL 34*   BNP (last 3 results) No results for input(s): PROBNP in the last 8760 hours. HbA1C: No results for input(s): HGBA1C in the last 72 hours. CBG: Recent Labs  Lab 03/22/21 1955  GLUCAP 123*   Lipid Profile: No results for input(s): CHOL, HDL, LDLCALC, TRIG, CHOLHDL, LDLDIRECT in the last 72 hours. Thyroid Function Tests: Recent Labs    03/22/21 1751  TSH 3.416   Anemia Panel: No results for input(s): VITAMINB12, FOLATE, FERRITIN, TIBC, IRON, RETICCTPCT in the last 72 hours. Sepsis Labs: Recent  Labs  Lab 03/22/21 2000 03/22/21 2035 03/22/21 2247  PROCALCITON 0.12  --   --   LATICACIDVEN  --  1.1 1.0    Recent Results (from the past 240 hour(s))  Blood culture (routine x 2)     Status: None (Preliminary result)   Collection Time: 03/22/21  5:52 PM   Specimen: BLOOD  Result Value Ref Range Status   Specimen Description BLOOD SITE NOT SPECIFIED  Final   Special Requests   Final    BOTTLES DRAWN AEROBIC AND ANAEROBIC Blood Culture results may not be optimal due to an excessive volume of blood received in culture bottles   Culture   Final    NO GROWTH 2 DAYS Performed at [facility], [place] [address] [place], [place] [address]    Report Status PENDING  Incomplete  Blood culture (routine x 2)     Status: None (Preliminary result)   Collection Time: 03/22/21  5:53 PM   Specimen: BLOOD  Result Value Ref Range Status   Specimen Description BLOOD SITE NOT SPECIFIED  Final   Special Requests   Final    BOTTLES DRAWN AEROBIC AND ANAEROBIC Blood Culture adequate volume   Culture   Final    NO GROWTH 2 DAYS Performed at [facility], [place] [address], [place], [place] [address]    Report Status PENDING  Incomplete  Resp Panel by RT-PCR (Flu A&B, Covid) Nasopharyngeal Swab     Status: Abnormal   Collection Time: 03/22/21  8:44 PM   Specimen: Nasopharyngeal Swab; Nasopharyngeal(NP) swabs in vial transport medium  Result Value Ref Range Status   SARS Coronavirus 2 by RT PCR POSITIVE (A) NEGATIVE Final    Comment: (NOTE) SARS-CoV-2 target nucleic acids are DETECTED.  The SARS-CoV-2 RNA is generally detectable in upper respiratory specimens during the acute phase of infection. Positive results are indicative of the presence of the identified virus, but do not rule out bacterial infection or co-infection with other pathogens not detected by the test. Clinical correlation with patient history and other diagnostic information is necessary to determine patient infection  status. The expected result is Negative.  Fact Sheet for Patients: EntrepreneurPulse.com.au  Fact Sheet for  Healthcare Providers: IncredibleEmployment.be  This test is not yet approved or cleared by the [place] and  has been authorized for detection and/or diagnosis of SARS-CoV-2 by FDA under an Emergency Use Authorization (EUA).  This EUA will remain in effect (meaning this test can be used) for the duration of  the COVID-19 declaration under Section 564(b)(1) of the A ct, 21 U.S.C. section 360bbb-3(b)(1), unless the authorization is terminated or revoked sooner.     Influenza A by PCR NEGATIVE NEGATIVE Final   Influenza B by PCR NEGATIVE NEGATIVE Final    Comment: (NOTE) The Xpert Xpress SARS-CoV-2/FLU/RSV plus assay is intended as an aid in the diagnosis of influenza from Nasopharyngeal swab specimens and should not be used as a sole basis for treatment. Nasal washings and aspirates are unacceptable for Xpert Xpress SARS-CoV-2/FLU/RSV testing.  Fact Sheet for Patients: EntrepreneurPulse.com.au  Fact Sheet for Healthcare Providers: IncredibleEmployment.be  This test is not yet approved or cleared by the [place] FDA and has been authorized for detection and/or diagnosis of SARS-CoV-2 by FDA under an Emergency Use Authorization (EUA). This EUA will remain in effect (meaning this test can be used) for the duration of the COVID-19 declaration under Section 564(b)(1) of the Act, 21 U.S.C. section 360bbb-3(b)(1), unless the authorization is terminated or revoked.  Performed at [facility], [place] [address], [place], [place] [address]   MRSA Next Gen by PCR, Nasal     Status: None   Collection Time: 03/23/21  9:02 AM   Specimen: Nasal Mucosa; Nasal Swab  Result Value Ref Range Status   MRSA by PCR Next Gen NOT DETECTED NOT DETECTED Final    Comment: (NOTE) The GeneXpert MRSA Assay  (FDA approved for NASAL specimens only), is one component of a comprehensive MRSA colonization surveillance program. It is not intended to diagnose MRSA infection nor to guide or monitor treatment for MRSA infections. Test performance is not FDA approved in patients less than 63 years old. Performed at [facility], [place] [address], [place], [place] [address]          Radiology Studies: No results found.      Scheduled Meds:  amLODipine  5 mg Oral q morning   clopidogrel  75 mg Oral q morning   divalproex  250 mg Oral Q12H   levothyroxine  50 mcg Oral Once per day on Mon Tue Wed Thu Fri Sat   pantoprazole sodium  40 mg Oral QPM   Continuous Infusions:  azithromycin 500 mg (03/25/21 1011)   cefTRIAXone (ROCEPHIN)  IV Stopped (03/24/21 1516)     LOS: 3 days       Shelly Coss, MD [facility] P1/30/2023, 11:25 AM

## 2021-03-25 NOTE — Plan of Care (Signed)
Pt is alert oriented x 1, pt has been turned and dried. Oral care completed. IV fluids continued per order. Pt perineal care completed. Pt had 384mls urine out put. PRN tylenol given for comfort.      Problem: Education: Goal: Knowledge of General Education information will improve Description: Including pain rating scale, medication(s)/side effects and non-pharmacologic comfort measures Outcome: Progressing   Problem: Education: Goal: Knowledge of General Education information will improve Description: Including pain rating scale, medication(s)/side effects and non-pharmacologic comfort measures Outcome: Progressing   Problem: Health Behavior/Discharge Planning: Goal: Ability to manage health-related needs will improve Outcome: Progressing   Problem: Clinical Measurements: Goal: Ability to maintain clinical measurements within normal limits will improve Outcome: Progressing Goal: Will remain free from infection Outcome: Progressing Goal: Diagnostic test results will improve Outcome: Progressing Goal: Respiratory complications will improve Outcome: Progressing Goal: Cardiovascular complication will be avoided Outcome: Progressing   Problem: Nutrition: Goal: Adequate nutrition will be maintained Outcome: Progressing   Problem: Coping: Goal: Level of anxiety will decrease Outcome: Progressing   Problem: Elimination: Goal: Will not experience complications related to bowel motility Outcome: Progressing Goal: Will not experience complications related to urinary retention Outcome: Progressing   Problem: Pain Managment: Goal: General experience of comfort will improve Outcome: Progressing   Problem: Safety: Goal: Ability to remain free from injury will improve Outcome: Progressing   Problem: Skin Integrity: Goal: Risk for impaired skin integrity will decrease Outcome: Progressing

## 2021-03-25 NOTE — Progress Notes (Signed)
Speech Language Pathology Treatment: Dysphagia  Patient Details Name: Danielle Bryan MRN: [id number] DOB: 08/08/1923 Today's Date: 03/25/2021 Time: 1130-1200 SLP Time Calculation (min) (ACUTE ONLY): 30 min  Assessment / Plan / Recommendation Clinical Impression  Pt seen for dysphagia treatment and diet check. Pt alert, cooperative and pleasant throughout the session. Pt asleep upon entry in the room, but pt awakened to her voice and with tactile cues from SLP. At baseline, pt presented with a cough prior to PO trials. Pt was presented with thin liquids via open cup and straw. Pt displayed immediate cough with open cup; Straw and minimal verbal cues to take small sips benefited the pt's performance and reduced coughing. Pt also consumed a graham cracker with no overt s/sx of aspiration, coughing or throat clearing noted. Mastication was [facility] and oral clearance adequate. Pt made formal request for more graham cracker during the session and is able to communicate wants/needs with team members. Pt.'s diet will be advanced to regular and thin liquids. No SLP to follow up. At this time, recommend pt d/c back to [facility]    HPI HPI: Patient is a 88 y.o. female who is a resident at [facility] with PMH: HTN, acquired hypothyroidism,, multiple prior TIA's, recent COVID-19 infection, chronic diastolic heart failure,, who was admitted to [facility] on 1/27 with severe sepsis due community acquired PNA after presenting to [facility] ED for evaluation of AMS. CT head did not show any acute intracranial abnormalities, CT chest revealed new focal opacity within the right upper lobe suspicious for pneumonia.      SLP Plan  Continue with current plan of care      Recommendations for follow up therapy are one component of a multi-disciplinary discharge planning process, led by the attending physician.  Recommendations may be updated based on patient status, additional functional criteria and insurance authorization.     Recommendations  Diet recommendations: Regular;Thin liquid Liquids provided via: Cup Medication Administration: Crushed with puree Supervision: Staff to assist with self feeding;Full supervision/cueing for compensatory strategies Compensations: Small sips/bites;Slow rate Postural Changes and/or Swallow Maneuvers: Seated upright 90 degrees;Upright 30-60 min after meal                Oral Care Recommendations: Oral care BID;Staff/trained caregiver to provide oral care Follow Up Recommendations: Home health SLP Assistance recommended at discharge: Frequent or constant Supervision/Assistance SLP Visit Diagnosis: Dysphagia, unspecified (R13.10) Plan: Continue with current plan of care           [facility]  03/25/2021, 12:24 PM

## 2021-03-25 NOTE — Care Management Important Message (Signed)
Important Message  Patient Details  Name: Danielle Bryan MRN: [id number] Date of Birth: 1924/01/13   Medicare Important Message Given:  Yes     [name] 03/25/2021, 1:51 PM

## 2021-03-25 NOTE — Plan of Care (Signed)

## 2021-03-26 DIAGNOSIS — G9341 Metabolic encephalopathy: Secondary | ICD-10-CM | POA: Diagnosis not present

## 2021-03-26 DIAGNOSIS — Z515 Encounter for palliative care: Secondary | ICD-10-CM | POA: Diagnosis not present

## 2021-03-26 LAB — RESP PANEL BY RT-PCR (FLU A&B, COVID) ARPGX2
Influenza A by PCR: NEGATIVE
Influenza B by PCR: NEGATIVE
SARS Coronavirus 2 by RT PCR: NEGATIVE

## 2021-03-26 MED ORDER — CEFDINIR 300 MG PO CAPS: 300.0000 mg | ORAL_CAPSULE | Freq: Two times a day (BID) | ORAL | 0 refills | Status: AC

## 2021-03-26 MED ORDER — CEFDINIR 300 MG PO CAPS
300.0000 mg | ORAL_CAPSULE | Freq: Two times a day (BID) | ORAL | Status: AC
  Administered 2021-03-26 – 2021-03-28 (×5): 300 mg via ORAL
  Filled 2021-03-26 (×6): qty 1

## 2021-03-26 MED ORDER — LEVOTHYROXINE SODIUM 100 MCG PO TABS: 100.0000 ug | ORAL_TABLET | ORAL | Status: AC

## 2021-03-26 MED ORDER — AMLODIPINE BESYLATE 10 MG PO TABS
10.0000 mg | ORAL_TABLET | Freq: Every day | ORAL | Status: DC
  Administered 2021-03-27 – 2021-03-28 (×2): 10 mg via ORAL
  Filled 2021-03-26 (×2): qty 1

## 2021-03-26 MED ORDER — BENZONATATE 200 MG PO CAPS
200.0000 mg | ORAL_CAPSULE | Freq: Three times a day (TID) | ORAL | 0 refills | Status: AC | PRN
Start: 2021-03-26 — End: ?

## 2021-03-26 MED ORDER — AMLODIPINE BESYLATE 10 MG PO TABS: 10.0000 mg | ORAL_TABLET | Freq: Every day | ORAL | Status: AC

## 2021-03-26 NOTE — Progress Notes (Signed)
° °  Palliative Medicine Inpatient Follow Up Note  HPI:  Per intake H&P --> Patient is a 88 year old female with history of hypertension, hypothyroidism, TIA, recent COVID infection, chronic diastolic congestive heart failure who presented here with altered mental status from assisted living facility.     Palliative care saw Danielle Bryan in November of 2022 she was made a DNR at that time and Palliative/Hospice services were discussed though Danielle Bryan (son) was nor ready for these at that time.    On this hospitalization Palliative care has been asked to get involved in the setting of pneumonia to further identify goals moving forward.  Today's Discussion (03/26/2021):  *Please note that this is a verbal dictation therefore any spelling or grammatical errors are due to the "[place] [employer]" system interpretation.  Chart reviewed inclusive of vital signs, progress notes, laboratory results, and diagnostic images.   I met at bedside this morning with Danielle Bryan she awake and alert. She denied pain, dyspnea, or nausea.   No Family at bedside this morning.   Palliative Support Provided  Objective Assessment: Vital Signs Vitals:   03/26/21 0849 03/26/21 1157  BP: (!) 157/74 (!) 155/64  Pulse: 96 100  Resp: 18 18  Temp: 98.1 F (36.7 C) 99.1 F (37.3 C)  SpO2: 96% 94%    Intake/Output Summary (Last 24 hours) at 03/26/2021 1321 Last data filed at 03/26/2021 0500 Gross per 24 hour  Intake 350 ml  Output 1100 ml  Net -750 ml    Last Weight  Most recent update: 03/24/2021  4:38 AM    Weight  56.1 kg (123 lb 10.9 oz)            Gen:  Frail elderly F in mild distress HEENT: moist mucous membranes CV: Irregular rate and rhythm  PULM: On RA, breathing Is even and non-labored ABD: soft/non-tender  EXT: No edema  Neuro: Pleasantly disoriented   SUMMARY OF RECOMMENDATIONS   DNAR/DNI   Plan for SNF placement at [facility] support during hospitalization  MDM -  Low ______________________________________________________________________________________ [place] Team Team Cell Phone: [phone] Please utilize secure chat with additional questions, if there is no response within 30 minutes please call the above phone number  Palliative Medicine Team providers are available by phone from 7am to 7pm daily and can be reached through the team cell phone.  Should this patient require assistance outside of these hours, please call the patient's attending physician.

## 2021-03-26 NOTE — TOC Initial Note (Addendum)
Transition of Care [facility]) - Initial/Assessment Note    Patient Details  Name: Danielle Bryan MRN: [id number] Date of Birth: [birth year]  Transition of Care [facility]) CM/SW Contact:    Geralynn Ochs, LCSW Phone Number: 03/26/2021, 10:48 AM  Clinical Narrative:        CSW sent therapy notes to [facility] yesterday for review, called back today to ask if patient could return to ALF. [facility] recommends SNF prior to return to ALF. CSW spoke with patient's son, [name], and he is in agreement. Patient has been to [facility] recently, would like to return if able. CSW sent referral and asked [facility] to review. CSW to follow.          UPDATE 4:30 PM: [facility] can offer a bed, they will initiate insurance authorization with [employer] Discharge pending insurance approval. CSW to follow.   Expected Discharge Plan: Skilled Nursing Facility Barriers to Discharge: Continued Medical Work up, Ship broker   Patient Goals and CMS Choice Patient states their goals for this hospitalization and ongoing recovery are:: patient unable to participate in goal setting, not fully oriented CMS Medicare.gov Compare Post Acute Care list provided to:: Patient Represenative (must comment) Choice offered to / list presented to : Adult Children  Expected Discharge Plan and Services Expected Discharge Plan: [place]   Discharge Planning Services: CM Consult Post Acute Care Choice: [place] Living arrangements for the past 2 months: [place]                                      Prior Living Arrangements/Services Living arrangements for the past 2 months: [place] Lives with:: Facility Resident Patient language and need for interpreter reviewed:: No Do you feel safe going back to the place where you live?: Yes      Need for Family Participation in Patient Care: Yes (Comment) Care giver support system in place?: No (comment)    Criminal Activity/Legal Involvement Pertinent to Current Situation/Hospitalization: No - Comment as needed  Activities of Daily Living Home Assistive Devices/Equipment: Wheelchair ADL Screening (condition at time of admission) Patient's cognitive ability adequate to safely complete daily activities?: No Is the patient deaf or have difficulty hearing?: Yes Does the patient have difficulty seeing, even when wearing glasses/contacts?: Yes Does the patient have difficulty concentrating, remembering, or making decisions?: Yes Patient able to express need for assistance with ADLs?: No Does the patient have difficulty dressing or bathing?: Yes Independently performs ADLs?: No Communication: Dependent Dressing (OT): Dependent Is this a change from baseline?: Change from baseline, expected to last <3days Grooming: Dependent Is this a change from baseline?: Change from baseline, expected to last <3 days Feeding: Dependent Bathing: Dependent Is this a change from baseline?: Change from baseline, expected to last <3 days Toileting: Dependent Is this a change from baseline?: Change from baseline, expected to last <3 days In/Out Bed: Dependent Is this a change from baseline?: Change from baseline, expected to last <3 days Walks in Home: Dependent Is this a change from baseline?: Change from baseline, expected to last <3 days Does the patient have difficulty walking or climbing stairs?: No Weakness of Legs: Both Weakness of Arms/Hands: Both  Permission Sought/Granted Permission sought to share information with : Facility Sport and exercise psychologist, Family Supports Permission granted to share information with : Yes, Verbal Permission Granted  Share Information with NAME: [name]  Permission granted  to share info w AGENCY: SNF  Permission granted to share info w Relationship: Son     Emotional Assessment   Attitude/Demeanor/Rapport: Unable to Assess Affect (typically observed): Unable to  Assess Orientation: : Oriented to Self, Oriented to Place, Oriented to Situation Alcohol / Substance Use: Not Applicable Psych Involvement: No (comment)  Admission diagnosis:  Acute metabolic encephalopathy [D62.22] Patient Active Problem List   Diagnosis Date Noted   Severe sepsis ([place]) 03/22/2021   Community acquired pneumonia 03/22/2021   Chronic diastolic CHF (congestive heart failure) ([place]) 03/22/2021   GERD (gastroesophageal reflux disease) 03/22/2021   Depression 03/22/2021   Pressure injury of skin 01/13/2021   Fall 01/12/2021   Nasal bone fractures 01/12/2021   Pain due to onychomycosis of toenails of both feet 05/04/2019   Coagulation defect ([place]) 05/04/2019   Vertigo 01/27/2019   CVA (cerebral vascular accident) ([place]) 01/27/2019   Aphasia 03/20/2016   HTN (hypertension) 03/20/2016   Weakness 03/18/2015   Acute kidney injury ([place]) 03/18/2015   Dehydration 03/18/2015   Nausea and vomiting 03/18/2015   Thrombocytopenia ([place]) 03/18/2015   Moderate nausea and vomiting 03/18/2015   Elevated lactic acid level 03/18/2015   Elevated troponin 97/98/9211   Acute metabolic encephalopathy 94/17/4081   UTI (urinary tract infection) 08/22/2014   TIA (transient ischemic attack) 03/19/2014   Benign hypertensive heart disease without heart failure 08/20/2010   Hypothyroidism    Osteoarthritis    Hypercholesterolemia    Reactive depression (situational)    PCP:  Velna Hatchet, MD Pharmacy:   [id number] [place], [place] [address] [place] [address] Phone: [phone] Fax: [phone]  [facility] [place], [place] [address] Phone: [phone] Fax: [phone]  [facility] [place], [place] [address] Phone: [phone] Fax:  [phone]     Social Determinants of Health (SDOH) Interventions    Readmission Risk Interventions No flowsheet data found.

## 2021-03-26 NOTE — Progress Notes (Signed)
Physical Therapy Treatment Patient Details Name: Danielle Bryan MRN: [id number] DOB: 1923/05/22 Today's Date: 03/26/2021   History of Present Illness Patient is a 88 year old female who presented here with altered mental status from assisted living facility, pneumonia; with history of hypertension, hypothyroidism, TIA, recent COVID infection, chronic diastolic congestive heart failure    PT Comments    Pt oriented to self and location today, intermittently weeping during session and could not name cause. Pt requiring mod-max assist for bed mobility and repeated transfer to semi-standing, cannot reach full upright today given fatigue and weakness. Per CSM, pt's family would like pt to d/c to SNF given ALF cannot accept her with current assist levels. PT to continue to follow.     Recommendations for follow up therapy are one component of a multi-disciplinary discharge planning process, led by the attending physician.  Recommendations may be updated based on patient status, additional functional criteria and insurance authorization.  Follow Up Recommendations  Skilled nursing-short term rehab (<3 hours/day)     Assistance Recommended at Discharge Frequent or constant Supervision/Assistance  Patient can return home with the following A lot of help with walking and/or transfers;A lot of help with bathing/dressing/bathroom;Assistance with feeding   Equipment Recommendations  Wheelchair (measurements PT);Wheelchair cushion (measurements PT);Hospital bed    Recommendations for Other Services       Precautions / Restrictions Precautions Precautions: Fall Restrictions Weight Bearing Restrictions: No     Mobility  Bed Mobility Overal bed mobility: Needs Assistance Bed Mobility: Supine to Sit, Rolling, Sit to Supine Rolling: Min assist, +2 for safety/equipment Sidelying to sit: Mod assist, HOB elevated   Sit to supine: Mod assist, +2 for physical assistance   General bed mobility  comments: min assist for roll bilat for completion of truncal translation, pt soiled in stool and urine requiring assist for clean up. Mod assist +1-2 for trunk elevation from supine>sit and LE lifting back into bed, boost up.    Transfers Overall transfer level: Needs assistance Equipment used: Rolling walker (2 wheels) Transfers: Sit to/from Stand Sit to Stand: Max assist           General transfer comment: max assist for power up, hip extension via posterior pelvic facilitation. Pt comes to partial-standing position x2, clears buttocks x8 inches only    Ambulation/Gait                   Stairs             Wheelchair Mobility    Modified Rankin (Stroke Patients Only)       Balance Overall balance assessment: Needs assistance Sitting-balance support: No upper extremity supported, Feet supported Sitting balance-Leahy Scale: Fair Sitting balance - Comments: able to sit EOB without PT support once feet are on floor   Standing balance support: Bilateral upper extremity supported Standing balance-Leahy Scale: Zero Standing balance comment: max assist to partially stand                            Cognition Arousal/Alertness: Awake/alert Behavior During Therapy: WFL for tasks assessed/performed Overall Cognitive Status: No family/caregiver present to determine baseline cognitive functioning Area of Impairment: Following commands, Safety/judgement, Problem solving                       Following Commands: Follows one step commands with increased time Safety/Judgement: Decreased awareness of safety, Decreased awareness of deficits   Problem Solving: Requires verbal  cues, Requires tactile cues, Difficulty sequencing, Slow processing General Comments: Pt with limited verbalizations, mostly responds back to PT with similar wordage PT used. Pt intermittently tearful during session, does not give reason why when asked.        Exercises  General Exercises - Lower Extremity Long Arc Quad: AROM, Both, 15 reps, Seated    General Comments        Pertinent Vitals/Pain Pain Assessment Pain Assessment: No/denies pain Pain Intervention(s): Monitored during session    Home Living                          Prior Function            PT Goals (current goals can now be found in the care plan section) Acute Rehab PT Goals Patient Stated Goal: Did not state, but agreeable to getting OOB PT Goal Formulation: Patient unable to participate in goal setting Time For Goal Achievement: 04/07/21 Potential to Achieve Goals: Fair Progress towards PT goals: Progressing toward goals    Frequency    Min 2X/week      PT Plan Discharge plan needs to be updated    Co-evaluation              AM-PAC PT "6 Clicks" Mobility   Outcome Measure  Help needed turning from your back to your side while in a flat bed without using bedrails?: A Lot Help needed moving from lying on your back to sitting on the side of a flat bed without using bedrails?: A Lot Help needed moving to and from a bed to a chair (including a wheelchair)?: Total Help needed standing up from a chair using your arms (e.g., wheelchair or bedside chair)?: Total Help needed to walk in hospital room?: Total Help needed climbing 3-5 steps with a railing? : Total 6 Click Score: 8    End of Session Equipment Utilized During Treatment: Gait belt Activity Tolerance: Patient limited by fatigue Patient left: in bed;with bed alarm set;with call bell/phone within reach;with SCD's reapplied Nurse Communication: Mobility status (spoke with NT - linen change, fecal and urinary incontinence, needs assist with lunch) PT Visit Diagnosis: Unsteadiness on feet (R26.81);Other abnormalities of gait and mobility (R26.89);Muscle weakness (generalized) (M62.81)     Time: 7858-8502 PT Time Calculation (min) (ACUTE ONLY): 21 min  Charges:  $Therapeutic Activity: 8-22  mins                     Stacie Glaze, PT DPT Acute Rehabilitation Services Pager [phone]  Office [phone]    Roxine Caddy E Ruffin Pyo 03/26/2021, 1:54 PM

## 2021-03-26 NOTE — Discharge Summary (Addendum)
Physician Discharge Summary  Danielle Bryan WNU:[id number] DOB: 06-Dec-1923 DOA: 03/22/2021  PCP: Velna Hatchet, MD  Admit date: 03/22/2021 Discharge date: 03/28/2021  Admitted From: ALF Disposition:  SNF  Discharge Condition:Stable CODE STATUS: DNR Diet recommendation: Regular  Brief/Interim Summary:  Patient is a 88 year old female with history of hypertension, hypothyroidism, TIA, recent COVID infection, chronic diastolic congestive heart failure who presented here with altered mental status from assisted living facility.  COVID screen test came out to be positive again on admission, first positive on 03/05/2021.  She initially had symptoms of rhinorrhea when she was diagnosed with covid first which subsequently improved. Over 3 days before admission here, she developed new onset cough, confusion, patient was then brought to the emergency department.  She was recently admitted here from 01/12/2021 to 01/16/2021 for the management of nondisplaced nasal bone fracture.  On presentation, she was tachycardic but saturating fine on room air.  Lab work showed elevated leukocytes.  Chest x-ray showed interval development of focal opacity on the right upper lobe concerning for pneumonia.  CTA did not show any acute intracranial abnormalities.  Patient was admitted for the management of pneumonia on the background of recent COVID infection.  Started on antibiotics for CAP.  Respiratory status, mental status have significantly improved.  Currently she is on room air.  She was seen by PT/OT and recommending skilled nursing facility on discharge.  Following problems were addressed during her hospitalization:  Suspected severe sepsis secondary to community-acquired pneumonia Presented with new onset cough, altered mental status.  Chest x-ray showed right upper lobe opacity Met sepsis criteria with leukocytosis, tachycardia, tachypnea Currently afebrile, with resolved leukocytosis Required 2 L of oxygen per  minute, currently on room air BC x2 NGTD Negative strep pneumo antigen urine S/P azithromycin, ceftriaxone, vancomycin, de-escalated to p.o. cefdinir Continue flutter valve, cough suppressants as needed  Speech therapy recommended regular diet   Acute metabolic encephalopathy CT head did not show any acute intracranial abnormalities Oriented to name Delirium precautions   COVID infection COVID screen test initially  came out to be positive on 03/05/21 and 03/22/2021 Currently negative on 03/26/2021 Currently on room air, no precautions   Hypertension Currently BP stable Continue amlodipine   Hypothyroidism Continue Synthyroid   History of TIA Continue Plavix   Depression Continue Depakote, Zoloft   GERD Continue Protonix   Chronic diastolic congestive heart failure Last echo done in November 2022 showed EF of greater than 03%, grade 1 diastolic dysfunction.  Currently she is euvolemic   Deconditioning/debility/generalized weakness GOC She lives in ALF.  PT/OT recommending SNF Patient is DNR.  Palliaitve care has been following       Discharge Diagnoses:  Principal Problem:   Acute metabolic encephalopathy Active Problems:   Hypothyroidism   HTN (hypertension)   Severe sepsis ([place])   Community acquired pneumonia   Chronic diastolic CHF (congestive heart failure) (HCC)   GERD (gastroesophageal reflux disease)   Depression    Discharge Instructions  Discharge Instructions     Diet general   Complete by: As directed    Discharge instructions   Complete by: As directed    1)Please take prescribed medications as instructed 2)Do  CBC and BMP tests in a week 3)Follow up with your PCP in a week   Increase activity slowly   Complete by: As directed       Allergies as of 03/28/2021       Reactions   Escitalopram Oxalate Nausea Only   Celexa [citalopram]  Other (See Comments)   Unknown   Escitalopram Other (See Comments)   unknown   Lisinopril Other (See  Comments)   Very decreased blood pressure    Statins Other (See Comments)   myalgias   Sulfa Antibiotics Hives, Other (See Comments)        Medication List     TAKE these medications    acetaminophen 500 MG tablet Commonly known as: TYLENOL Take 1,000 mg by mouth See admin instructions. Take 2 tablets (1000 mg) by mouth every morning, may also take 2 tablets (1000 mg) in the evening as needed for headache/pain   amLODipine 10 MG tablet Commonly known as: NORVASC Take 1 tablet (10 mg total) by mouth daily. What changed:  medication strength how much to take when to take this   benzonatate 200 MG capsule Commonly known as: TESSALON Take 1 capsule (200 mg total) by mouth 3 (three) times daily as needed for cough.   cefdinir 300 MG capsule Commonly known as: OMNICEF Take 1 capsule (300 mg total) by mouth every 12 (twelve) hours for 2 days.   clopidogrel 75 MG tablet Commonly known as: PLAVIX Take 1 tablet (75 mg total) by mouth daily. What changed: when to take this   divalproex 500 MG 24 hr tablet Commonly known as: DEPAKOTE ER TAKE 1 TABLET BY MOUTH EVERY DAY What changed:  how to take this when to take this   levothyroxine 100 MCG tablet Commonly known as: SYNTHROID Take 1 tablet (100 mcg total) by mouth See admin instructions. Take one tablet (50 mcg) by mouth 6 days weekly in the morning, skip Sundays What changed: See the new instructions.   pantoprazole 40 MG tablet Commonly known as: PROTONIX Take 40 mg by mouth every evening.   rosuvastatin 10 MG tablet Commonly known as: CRESTOR Take 10 mg by mouth at bedtime.   sertraline 50 MG tablet Commonly known as: ZOLOFT Take 1 tablet (50 mg total) by mouth daily.   vitamin E 180 MG (400 UNITS) capsule Take 400 Units by mouth at bedtime.        Contact information for follow-up providers     Velna Hatchet, MD. Schedule an appointment as soon as possible for a visit in 1 week(s).   Specialty:  Internal Medicine Contact information: [place] [address] [phone]              Contact information for after-discharge care     Destination     [facility] Preferred SNF .   Service: Skilled Nursing Contact information: [place] [address] [phone]                    Allergies  Allergen Reactions   Escitalopram Oxalate Nausea Only   Celexa [Citalopram] Other (See Comments)    Unknown   Escitalopram Other (See Comments)    unknown   Lisinopril Other (See Comments)    Very decreased blood pressure    Statins Other (See Comments)    myalgias   Sulfa Antibiotics Hives and Other (See Comments)    Consultations: Palliative care   Procedures/Studies: CT HEAD WO CONTRAST (5MM)  Result Date: 03/22/2021 CLINICAL DATA:  Altered mental status. EXAM: CT HEAD WITHOUT CONTRAST TECHNIQUE: Contiguous axial images were obtained from the base of the skull through the vertex without intravenous contrast. RADIATION DOSE REDUCTION: This exam was performed according to the departmental dose-optimization program which includes automated exposure control, adjustment  of the mA and/or kV according to patient size and/or use of iterative reconstruction technique. COMPARISON:  January 12, 2021. FINDINGS: Brain: Mild chronic ischemic white matter disease is noted. No mass effect or midline shift is noted. Ventricular size is within normal limits. There is no evidence of mass lesion, hemorrhage or acute infarction. Vascular: No hyperdense vessel or unexpected calcification. Skull: Normal. Negative for fracture or focal lesion. Sinuses/Orbits: No acute finding. Other: None. IMPRESSION: No acute intracranial abnormality seen. Electronically Signed   By: Marijo Conception M.D.   On: 03/22/2021 17:19   DG Chest Portable 1 View  Result Date: 03/22/2021 CLINICAL DATA:  concern for pna after recent Covid19 infection  EXAM: PORTABLE CHEST 1 VIEW COMPARISON:  01/12/2021 FINDINGS: Heart size within normal limits. Aortic atherosclerosis. New focal opacity within the right upper lobe. Left lung appears clear. No definite pleural fluid collection. No pneumothorax. Exaggerated thoracic kyphosis. IMPRESSION: New focal opacity within the right upper lobe suspicious for pneumonia. Radiographic follow-up to resolution is recommended. Electronically Signed   By: Davina Poke D.O.   On: 03/22/2021 16:38      Subjective: Patient seen and examined at bedside, denies any new complaints.  Noted to be deconditioned  Discharge Exam: Vitals:   03/28/21 0329 03/28/21 0720  BP: (!) 152/65 (!) 150/68  Pulse: 95 93  Resp: 17 16  Temp: 99.6 F (37.6 C) 98.8 F (37.1 C)  SpO2: 95% 97%   Vitals:   03/27/21 2304 03/28/21 0329 03/28/21 0500 03/28/21 0720  BP: 131/61 (!) 152/65  (!) 150/68  Pulse: 92 95  93  Resp: _0 Temp: 98.9 F (37.2 C) 99.6 F (37.6 C)  98.8 F (37.1 C)  TempSrc: Oral Oral  Oral  SpO2: 95% 95%  97%  Weight:   54.4 kg   Height:        General: NAD, frail, deconditioned Cardiovascular: S1, S2 present Respiratory: Diminished breath sounds bilaterally  Abdomen: Soft, nontender, nondistended, bowel sounds present Musculoskeletal: No bilateral pedal edema noted Skin: Normal Psychiatry: Normal mood     The results of significant diagnostics from this hospitalization (including imaging, microbiology, ancillary and laboratory) are listed below for reference.     Microbiology: Recent Results (from the past 240 hour(s))  Blood culture (routine x 2)     Status: None   Collection Time: 03/22/21  5:52 PM   Specimen: BLOOD  Result Value Ref Range Status   Specimen Description BLOOD SITE NOT SPECIFIED  Final   Special Requests   Final    BOTTLES DRAWN AEROBIC AND ANAEROBIC Blood Culture results may not be optimal due to an excessive volume of blood received in culture bottles   Culture    Final    NO GROWTH 5 DAYS Performed at [facility], [place] [address], [place], [place] [address]    Report Status 03/27/2021 FINAL  Final  Blood culture (routine x 2)     Status: None   Collection Time: 03/22/21  5:53 PM   Specimen: BLOOD  Result Value Ref Range Status   Specimen Description BLOOD SITE NOT SPECIFIED  Final   Special Requests   Final    BOTTLES DRAWN AEROBIC AND ANAEROBIC Blood Culture adequate volume   Culture   Final    NO GROWTH 5 DAYS Performed at [facility], [place] [address] [place], [place] [address]    Report Status 03/27/2021 FINAL  Final  Resp Panel by RT-PCR (Flu  A&B, Covid) Nasopharyngeal Swab     Status: Abnormal   Collection Time: 03/22/21  8:44 PM   Specimen: Nasopharyngeal Swab; Nasopharyngeal(NP) swabs in vial transport medium  Result Value Ref Range Status   SARS Coronavirus 2 by RT PCR POSITIVE (A) NEGATIVE Final    Comment: (NOTE) SARS-CoV-2 target nucleic acids are DETECTED.  The SARS-CoV-2 RNA is generally detectable in upper respiratory specimens during the acute phase of infection. Positive results are indicative of the presence of the identified virus, but do not rule out bacterial infection or co-infection with other pathogens not detected by the test. Clinical correlation with patient history and other diagnostic information is necessary to determine patient infection status. The expected result is Negative.  Fact Sheet for Patients: EntrepreneurPulse.com.au  Fact Sheet for Healthcare Providers: IncredibleEmployment.be  This test is not yet approved or cleared by the [place] FDA and  has been authorized for detection and/or diagnosis of SARS-CoV-2 by FDA under an Emergency Use Authorization (EUA).  This EUA will remain in effect (meaning this test can be used) for the duration of  the COVID-19 declaration under Section 564(b)(1) of the A ct, 21 U.S.C. section 360bbb-3(b)(1),  unless the authorization is terminated or revoked sooner.     Influenza A by PCR NEGATIVE NEGATIVE Final   Influenza B by PCR NEGATIVE NEGATIVE Final    Comment: (NOTE) The Xpert Xpress SARS-CoV-2/FLU/RSV plus assay is intended as an aid in the diagnosis of influenza from Nasopharyngeal swab specimens and should not be used as a sole basis for treatment. Nasal washings and aspirates are unacceptable for Xpert Xpress SARS-CoV-2/FLU/RSV testing.  Fact Sheet for Patients: EntrepreneurPulse.com.au  Fact Sheet for Healthcare Providers: IncredibleEmployment.be  This test is not yet approved or cleared by the [place] FDA and has been authorized for detection and/or diagnosis of SARS-CoV-2 by FDA under an Emergency Use Authorization (EUA). This EUA will remain in effect (meaning this test can be used) for the duration of the COVID-19 declaration under Section 564(b)(1) of the Act, 21 U.S.C. section 360bbb-3(b)(1), unless the authorization is terminated or revoked.  Performed at [facility], [place] [address] [place], [place] [address]   MRSA Next Gen by PCR, Nasal     Status: None   Collection Time: 03/23/21  9:02 AM   Specimen: Nasal Mucosa; Nasal Swab  Result Value Ref Range Status   MRSA by PCR Next Gen NOT DETECTED NOT DETECTED Final    Comment: (NOTE) The GeneXpert MRSA Assay (FDA approved for NASAL specimens only), is one component of a comprehensive MRSA colonization surveillance program. It is not intended to diagnose MRSA infection nor to guide or monitor treatment for MRSA infections. Test performance is not FDA approved in patients less than 93 years old. Performed at [facility], [place] [address], [place], [place] [address]   Resp Panel by RT-PCR (Flu A&B, Covid) Nasopharyngeal Swab     Status: None   Collection Time: 03/26/21  4:38 PM   Specimen: Nasopharyngeal Swab; Nasopharyngeal(NP) swabs in vial transport medium   Result Value Ref Range Status   SARS Coronavirus 2 by RT PCR NEGATIVE NEGATIVE Final    Comment: (NOTE) SARS-CoV-2 target nucleic acids are NOT DETECTED.  The SARS-CoV-2 RNA is generally detectable in upper respiratory specimens during the acute phase of infection. The lowest concentration of SARS-CoV-2 viral copies this assay can detect is 138 copies/mL. A negative result does not preclude SARS-Cov-2 infection and should not be used as the  sole basis for treatment or other patient management decisions. A negative result may occur with  improper specimen collection/handling, submission of specimen other than nasopharyngeal swab, presence of viral mutation(s) within the areas targeted by this assay, and inadequate number of viral copies(<138 copies/mL). A negative result must be combined with clinical observations, patient history, and epidemiological information. The expected result is Negative.  Fact Sheet for Patients:  EntrepreneurPulse.com.au  Fact Sheet for Healthcare Providers:  IncredibleEmployment.be  This test is no t yet approved or cleared by the [place] FDA and  has been authorized for detection and/or diagnosis of SARS-CoV-2 by FDA under an Emergency Use Authorization (EUA). This EUA will remain  in effect (meaning this test can be used) for the duration of the COVID-19 declaration under Section 564(b)(1) of the Act, 21 U.S.C.section 360bbb-3(b)(1), unless the authorization is terminated  or revoked sooner.       Influenza A by PCR NEGATIVE NEGATIVE Final   Influenza B by PCR NEGATIVE NEGATIVE Final    Comment: (NOTE) The Xpert Xpress SARS-CoV-2/FLU/RSV plus assay is intended as an aid in the diagnosis of influenza from Nasopharyngeal swab specimens and should not be used as a sole basis for treatment. Nasal washings and aspirates are unacceptable for Xpert Xpress SARS-CoV-2/FLU/RSV testing.  Fact Sheet for  Patients: EntrepreneurPulse.com.au  Fact Sheet for Healthcare Providers: IncredibleEmployment.be  This test is not yet approved or cleared by the [place] FDA and has been authorized for detection and/or diagnosis of SARS-CoV-2 by FDA under an Emergency Use Authorization (EUA). This EUA will remain in effect (meaning this test can be used) for the duration of the COVID-19 declaration under Section 564(b)(1) of the Act, 21 U.S.C. section 360bbb-3(b)(1), unless the authorization is terminated or revoked.  Performed at [facility], [place] [address] [place], [place] [address]      Labs: BNP (last 3 results) Recent Labs    03/22/21 2247  BNP 24.4   Basic Metabolic Panel: Recent Labs  Lab 03/22/21 1751 03/23/21 0138 03/24/21 0126 03/28/21 0232  NA 133* 131* 133* 135  K 3.8 4.1 3.8 3.6  CL 96* 96* 100 97*  CO2 _0 GLUCOSE 110* 159* 142* 101*  BUN _1 CREATININE 0.64 0.61 0.58 0.53  CALCIUM 9.2 8.8* 8.2* 8.7*  MG 1.8 1.8  --   --   PHOS  --  4.0  --   --    Liver Function Tests: Recent Labs  Lab 03/22/21 1751 03/23/21 0138  AST 15 16  ALT 9 7  ALKPHOS 73 56  BILITOT 1.0 0.7  PROT 6.7 6.2*  ALBUMIN 3.5 3.2*   No results for input(s): LIPASE, AMYLASE in the last 168 hours. Recent Labs  Lab 03/23/21 0138  AMMONIA <10   CBC: Recent Labs  Lab 03/22/21 1751 03/23/21 0138 03/24/21 0126 03/28/21 0232  WBC 20.2* 23.3* 10.9* 6.7  NEUTROABS 15.5* 19.1* 9.9* 3.5  HGB 12.7 12.0 10.4* 10.4*  HCT 38.5 37.7 32.9* 31.8*  MCV 88.7 89.5 90.4 88.8  PLT 161 138* 117* 153   Cardiac Enzymes: Recent Labs  Lab 03/22/21 2000  CKTOTAL 34*   BNP: Invalid input(s): POCBNP CBG: Recent Labs  Lab 03/22/21 1955  GLUCAP 123*   D-Dimer No results for input(s): DDIMER in the last 72 hours. Hgb A1c No results for input(s): HGBA1C in the last 72 hours. Lipid Profile No results for input(s): CHOL, HDL,  LDLCALC, TRIG, CHOLHDL, LDLDIRECT in  the last 72 hours. Thyroid function studies No results for input(s): TSH, T4TOTAL, T3FREE, THYROIDAB in the last 72 hours.  Invalid input(s): FREET3 Anemia work up No results for input(s): VITAMINB12, FOLATE, FERRITIN, TIBC, IRON, RETICCTPCT in the last 72 hours. Urinalysis    Component Value Date/Time   COLORURINE YELLOW 03/23/2021 1131   APPEARANCEUR CLEAR 03/23/2021 1131   LABSPEC 1.024 03/23/2021 1131   PHURINE 5.0 03/23/2021 1131   GLUCOSEU NEGATIVE 03/23/2021 1131   HGBUR SMALL (A) 03/23/2021 1131   BILIRUBINUR NEGATIVE 03/23/2021 1131   KETONESUR 20 (A) 03/23/2021 1131   PROTEINUR 30 (A) 03/23/2021 1131   UROBILINOGEN 0.2 03/21/2014 1209   NITRITE NEGATIVE 03/23/2021 1131   LEUKOCYTESUR NEGATIVE 03/23/2021 1131   Sepsis Labs Invalid input(s): PROCALCITONIN,  WBC,  LACTICIDVEN Microbiology Recent Results (from the past 240 hour(s))  Blood culture (routine x 2)     Status: None   Collection Time: 03/22/21  5:52 PM   Specimen: BLOOD  Result Value Ref Range Status   Specimen Description BLOOD SITE NOT SPECIFIED  Final   Special Requests   Final    BOTTLES DRAWN AEROBIC AND ANAEROBIC Blood Culture results may not be optimal due to an excessive volume of blood received in culture bottles   Culture   Final    NO GROWTH 5 DAYS Performed at [facility], [place] [address], [place], [place] [address]    Report Status 03/27/2021 FINAL  Final  Blood culture (routine x 2)     Status: None   Collection Time: 03/22/21  5:53 PM   Specimen: BLOOD  Result Value Ref Range Status   Specimen Description BLOOD SITE NOT SPECIFIED  Final   Special Requests   Final    BOTTLES DRAWN AEROBIC AND ANAEROBIC Blood Culture adequate volume   Culture   Final    NO GROWTH 5 DAYS Performed at [facility], [place] [address], [place], [place] [address]    Report Status 03/27/2021 FINAL  Final  Resp Panel by RT-PCR (Flu A&B, Covid) Nasopharyngeal Swab      Status: Abnormal   Collection Time: 03/22/21  8:44 PM   Specimen: Nasopharyngeal Swab; Nasopharyngeal(NP) swabs in vial transport medium  Result Value Ref Range Status   SARS Coronavirus 2 by RT PCR POSITIVE (A) NEGATIVE Final    Comment: (NOTE) SARS-CoV-2 target nucleic acids are DETECTED.  The SARS-CoV-2 RNA is generally detectable in upper respiratory specimens during the acute phase of infection. Positive results are indicative of the presence of the identified virus, but do not rule out bacterial infection or co-infection with other pathogens not detected by the test. Clinical correlation with patient history and other diagnostic information is necessary to determine patient infection status. The expected result is Negative.  Fact Sheet for Patients: EntrepreneurPulse.com.au  Fact Sheet for Healthcare Providers: IncredibleEmployment.be  This test is not yet approved or cleared by the [place] FDA and  has been authorized for detection and/or diagnosis of SARS-CoV-2 by FDA under an Emergency Use Authorization (EUA).  This EUA will remain in effect (meaning this test can be used) for the duration of  the COVID-19 declaration under Section 564(b)(1) of the A ct, 21 U.S.C. section 360bbb-3(b)(1), unless the authorization is terminated or revoked sooner.     Influenza A by PCR NEGATIVE NEGATIVE Final   Influenza B by PCR NEGATIVE NEGATIVE Final    Comment: (NOTE) The Xpert Xpress SARS-CoV-2/FLU/RSV plus assay is intended as an aid in the  diagnosis of influenza from Nasopharyngeal swab specimens and should not be used as a sole basis for treatment. Nasal washings and aspirates are unacceptable for Xpert Xpress SARS-CoV-2/FLU/RSV testing.  Fact Sheet for Patients: EntrepreneurPulse.com.au  Fact Sheet for Healthcare Providers: IncredibleEmployment.be  This test is not yet approved or cleared by the  [place] FDA and has been authorized for detection and/or diagnosis of SARS-CoV-2 by FDA under an Emergency Use Authorization (EUA). This EUA will remain in effect (meaning this test can be used) for the duration of the COVID-19 declaration under Section 564(b)(1) of the Act, 21 U.S.C. section 360bbb-3(b)(1), unless the authorization is terminated or revoked.  Performed at [facility], [place] [address], [place], [place] [address]   MRSA Next Gen by PCR, Nasal     Status: None   Collection Time: 03/23/21  9:02 AM   Specimen: Nasal Mucosa; Nasal Swab  Result Value Ref Range Status   MRSA by PCR Next Gen NOT DETECTED NOT DETECTED Final    Comment: (NOTE) The GeneXpert MRSA Assay (FDA approved for NASAL specimens only), is one component of a comprehensive MRSA colonization surveillance program. It is not intended to diagnose MRSA infection nor to guide or monitor treatment for MRSA infections. Test performance is not FDA approved in patients less than 89 years old. Performed at [facility], [place] [address], [place], [place] [address]   Resp Panel by RT-PCR (Flu A&B, Covid) Nasopharyngeal Swab     Status: None   Collection Time: 03/26/21  4:38 PM   Specimen: Nasopharyngeal Swab; Nasopharyngeal(NP) swabs in vial transport medium  Result Value Ref Range Status   SARS Coronavirus 2 by RT PCR NEGATIVE NEGATIVE Final    Comment: (NOTE) SARS-CoV-2 target nucleic acids are NOT DETECTED.  The SARS-CoV-2 RNA is generally detectable in upper respiratory specimens during the acute phase of infection. The lowest concentration of SARS-CoV-2 viral copies this assay can detect is 138 copies/mL. A negative result does not preclude SARS-Cov-2 infection and should not be used as the sole basis for treatment or other patient management decisions. A negative result may occur with  improper specimen collection/handling, submission of specimen other than nasopharyngeal swab, presence of  viral mutation(s) within the areas targeted by this assay, and inadequate number of viral copies(<138 copies/mL). A negative result must be combined with clinical observations, patient history, and epidemiological information. The expected result is Negative.  Fact Sheet for Patients:  EntrepreneurPulse.com.au  Fact Sheet for Healthcare Providers:  IncredibleEmployment.be  This test is no t yet approved or cleared by the [place] FDA and  has been authorized for detection and/or diagnosis of SARS-CoV-2 by FDA under an Emergency Use Authorization (EUA). This EUA will remain  in effect (meaning this test can be used) for the duration of the COVID-19 declaration under Section 564(b)(1) of the Act, 21 U.S.C.section 360bbb-3(b)(1), unless the authorization is terminated  or revoked sooner.       Influenza A by PCR NEGATIVE NEGATIVE Final   Influenza B by PCR NEGATIVE NEGATIVE Final    Comment: (NOTE) The Xpert Xpress SARS-CoV-2/FLU/RSV plus assay is intended as an aid in the diagnosis of influenza from Nasopharyngeal swab specimens and should not be used as a sole basis for treatment. Nasal washings and aspirates are unacceptable for Xpert Xpress SARS-CoV-2/FLU/RSV testing.  Fact Sheet for Patients: EntrepreneurPulse.com.au  Fact Sheet for Healthcare Providers: IncredibleEmployment.be  This test is not yet approved or cleared by the [place] FDA and has  been authorized for detection and/or diagnosis of SARS-CoV-2 by FDA under an Emergency Use Authorization (EUA). This EUA will remain in effect (meaning this test can be used) for the duration of the COVID-19 declaration under Section 564(b)(1) of the Act, 21 U.S.C. section 360bbb-3(b)(1), unless the authorization is terminated or revoked.  Performed at [facility], [place] [address] [place], [place] [address]     Please note: You were  cared for by a hospitalist during your hospital stay. Once you are discharged, your primary care physician will handle any further medical issues. Please note that NO REFILLS for any discharge medications will be authorized once you are discharged, as it is imperative that you return to your primary care physician (or establish a relationship with a primary care physician if you do not have one) for your post hospital discharge needs so that they can reassess your need for medications and monitor your lab values.    Time coordinating discharge: 40 minutes  SIGNED:   Alma Friendly, MD  [facility] 03/28/2021, 11:08 AM   If 7PM-7AM, please contact night-coverage [email or web address]

## 2021-03-26 NOTE — NC FL2 (Signed)
[place] LEVEL OF CARE SCREENING TOOL     IDENTIFICATION  Patient Name: Danielle Bryan Birthdate: 04-Apr-1923 Sex: female Admission Date (Current Location): 03/22/2021  [facility] and [place] Number:  Herbalist and Address:  [facility] [place], [place] [address] [place], [place] [address]      Provider Number: [id number]  Attending Physician Name and Address:  Shelly Coss, MD  Relative Name and Phone Number:       Current Level of Care: Hospital Recommended Level of Care: [place] Prior Approval Number:    Date Approved/Denied:   PASRR Number: [id number]  Discharge Plan: SNF    Current Diagnoses: Patient Active Problem List   Diagnosis Date Noted   Severe sepsis ([place]) 03/22/2021   Community acquired pneumonia 03/22/2021   Chronic diastolic CHF (congestive heart failure) ([place]) 03/22/2021   GERD (gastroesophageal reflux disease) 03/22/2021   Depression 03/22/2021   Pressure injury of skin 01/13/2021   Fall 01/12/2021   Nasal bone fractures 01/12/2021   Pain due to onychomycosis of toenails of both feet 05/04/2019   Coagulation defect ([place]) 05/04/2019   Vertigo 01/27/2019   CVA (cerebral vascular accident) ([place]) 01/27/2019   Aphasia 03/20/2016   HTN (hypertension) 03/20/2016   Weakness 03/18/2015   Acute kidney injury ([place]) 03/18/2015   Dehydration 03/18/2015   Nausea and vomiting 03/18/2015   Thrombocytopenia ([place]) 03/18/2015   Moderate nausea and vomiting 03/18/2015   Elevated lactic acid level 03/18/2015   Elevated troponin 74/09/1446   Acute metabolic encephalopathy 18/56/3149   UTI (urinary tract infection) 08/22/2014   TIA (transient ischemic attack) 03/19/2014   Benign hypertensive heart disease without heart failure 08/20/2010   Hypothyroidism    Osteoarthritis    Hypercholesterolemia    Reactive depression (situational)     Orientation RESPIRATION BLADDER Height & Weight     Self,  Situation, Place  Normal Incontinent Weight: 123 lb 10.9 oz (56.1 kg) Height:  5\' 1"  (154.9 cm)  BEHAVIORAL SYMPTOMS/MOOD NEUROLOGICAL BOWEL NUTRITION STATUS      Continent Diet (regular)  AMBULATORY STATUS COMMUNICATION OF NEEDS Skin   Extensive Assist Verbally Normal                       Personal Care Assistance Level of Assistance  Bathing, Dressing, Feeding Bathing Assistance: Limited assistance Feeding assistance: Limited assistance Dressing Assistance: Limited assistance     Functional Limitations Info  Hearing, Sight Sight Info: Impaired Hearing Info: Impaired (hearing aids)      [place]  PT (By licensed PT), OT (By licensed OT)     PT Frequency: 5x/wk OT Frequency: 5x/wk            Contractures Contractures Info: Not present    Additional Factors Info  Code Status, Allergies, Psychotropic Code Status Info: DNR Allergies Info: Escitalopram Oxalate, Celexa (Citalopram), Escitalopram, Lisinopril, Statins, Sulfa Antibiotics Psychotropic Info: Depakote sprinkle 250mg  every 12 hours         Current Medications (03/26/2021):  This is the current hospital active medication list Current Facility-Administered Medications  Medication Dose Route Frequency Provider Last Rate Last Admin   acetaminophen (TYLENOL) tablet 650 mg  650 mg Per Tube Q6H PRN Shelly Coss, MD   650 mg at 03/25/21 0519   Or   acetaminophen (TYLENOL) suppository 650 mg  650 mg Rectal Q6H PRN Shelly Coss, MD       amLODipine (NORVASC) tablet 5 mg  5  mg Oral q morning Shelly Coss, MD   5 mg at 03/25/21 1003   azithromycin (ZITHROMAX) 500 mg in sodium chloride 0.9 % 250 mL IVPB  500 mg Intravenous Q24H Howerter, Justin B, DO   Stopped at 03/25/21 1111   benzonatate (TESSALON) capsule 200 mg  200 mg Oral TID PRN Howerter, Justin B, DO   200 mg at 03/24/21 2318   cefTRIAXone (ROCEPHIN) 2 g in sodium chloride 0.9 % 100 mL IVPB  2 g Intravenous Q24H Shelly Coss,  MD   Stopped at 03/25/21 1429   clopidogrel (PLAVIX) tablet 75 mg  75 mg Oral q morning Shelly Coss, MD   75 mg at 03/25/21 1004   divalproex (DEPAKOTE SPRINKLE) capsule 250 mg  250 mg Oral Q12H Ardyth Harps B, RPH   250 mg at 03/25/21 2147   levothyroxine (SYNTHROID) tablet 50 mcg  50 mcg Oral Once per day on Mon Tue Wed Thu Fri Sat Shelly Coss, MD   50 mcg at 03/26/21 0506   pantoprazole sodium (PROTONIX) 40 mg/20 mL oral suspension 40 mg  40 mg Oral QPM Shelly Coss, MD   40 mg at 03/25/21 1823     Discharge Medications: Please see discharge summary for a list of discharge medications.  Relevant Imaging Results:  Relevant Lab Results:   Additional Information SS#: [id number]  Geralynn Ochs, LCSW

## 2021-03-27 LAB — CULTURE, BLOOD (ROUTINE X 2)
Culture: NO GROWTH
Culture: NO GROWTH
Special Requests: ADEQUATE

## 2021-03-27 NOTE — Progress Notes (Signed)
PROGRESS NOTE    Danielle Bryan  AQT:[id number] DOB: 09-26-1923 DOA: 03/22/2021 PCP: Velna Hatchet, MD   Chief Complain: Altered mental status  Brief Narrative: Patient is a 88 year old female with history of hypertension, hypothyroidism, TIA, recent COVID infection, chronic diastolic congestive heart failure who presented here with altered mental status from assisted living facility.  COVID screen test came out to be positive again on admission, first positive on 03/05/2021.  She initially had symptoms of rhinorrhea when she was diagnosed with covid first which subsequently improved. Over 3 days before admission here, she developed new onset cough, confusion, patient was then brought to the emergency department.  She was recently admitted here from 01/12/2021 to 01/16/2021 for the management of nondisplaced nasal bone fracture.  On presentation, she was tachycardic but saturating fine on room air.  Lab work showed elevated leukocytes.  Chest x-ray showed interval development of focal opacity on the right upper lobe concerning for pneumonia.  CTA did not show any acute intracranial abnormalities.  Patient was admitted for the management of pneumonia on the background of recent COVID infection.  Started on antibiotics for CAP.  Overall status has improved.  Assessment & Plan:   Principal Problem:   Acute metabolic encephalopathy Active Problems:   Hypothyroidism   HTN (hypertension)   Severe sepsis ([place])   Community acquired pneumonia   Chronic diastolic CHF (congestive heart failure) ([facility])   GERD (gastroesophageal reflux disease)   Depression   Suspected severe sepsis secondary to community-acquired pneumonia Presented with new onset cough, altered mental status.  Chest x-ray showed right upper lobe opacity Met sepsis criteria with leukocytosis, tachycardia, tachypnea Currently afebrile, with resolving leukocytosis Required 2 L of oxygen per minute, currently on room air BC x2  NGTD Negative strep pneumo antigen urine S/P azithromycin, ceftriaxone, vancomycin, de-escalated to p.o. cefdinir Continue flutter valve, cough suppressants as needed  Speech therapy recommended dys 1 diet  Acute metabolic encephalopathy CT head did not show any acute intracranial abnormalities Oriented to name Delirium precautions  COVID infection COVID screen test initially  came out to be positive on 03/05/21 and 03/22/2021 Currently negative on 03/26/2021 Currently on room air, no precautions  Hypertension Currently BP stable Continue amlodipine  Hypothyroidism Continue Synthyroid  History of TIA Continue Plavix  Depression Continue Depakote, Zoloft on hold  GERD Continue Protonix  Chronic diastolic congestive heart failure Last echo done in November 2022 showed EF of greater than 56%, grade 1 diastolic dysfunction.  Currently she is euvolemic  Deconditioning/debility/generalized weakness GOC She lives in ALF.  PT/OT recommending SNF on discharge Patient is DNR.  Palliaitve care has been following.     DVT prophylaxis:SCD Code Status: DNR Family Communication: None at bedside  Patient status:Inpatient  Dispo: The patient is from: ALF              Anticipated d/c is to: SNF              Anticipated d/c date is: Pending insurance approval    Consultants: None  Procedures:None  Antimicrobials:  Anti-infectives (From admission, onward)    Start     Dose/Rate Route Frequency Ordered Stop   03/27/21 0000  cefdinir (OMNICEF) 300 MG capsule        300 mg Oral Every 12 hours 03/26/21 1350 03/29/21 2359   03/26/21 1345  cefdinir (OMNICEF) capsule 300 mg        300 mg Oral Every 12 hours 03/26/21 1257 03/29/21 0959   03/24/21 2130  vancomycin (VANCOREADY) IVPB 750 mg/150 mL  Status:  Discontinued        750 mg 150 mL/hr over 60 Minutes Intravenous Every 36 hours 03/22/21 2022 03/23/21 1513   03/24/21 1400  cefTRIAXone (ROCEPHIN) 2 g in sodium chloride 0.9 %  100 mL IVPB  Status:  Discontinued        2 g 200 mL/hr over 30 Minutes Intravenous Every 24 hours 03/23/21 1528 03/26/21 1257   03/23/21 1500  azithromycin (ZITHROMAX) 500 mg in sodium chloride 0.9 % 250 mL IVPB  Status:  Discontinued        500 mg 250 mL/hr over 60 Minutes Intravenous Every 24 hours 03/22/21 1957 03/26/21 1257   03/23/21 1500  cefTRIAXone (ROCEPHIN) 1 g in sodium chloride 0.9 % 100 mL IVPB  Status:  Discontinued        1 g 200 mL/hr over 30 Minutes Intravenous Every 24 hours 03/22/21 1957 03/23/21 1528   03/22/21 2030  vancomycin (VANCOCIN) IVPB 1000 mg/200 mL premix        1,000 mg 200 mL/hr over 60 Minutes Intravenous  Once 03/22/21 2015 03/23/21 0934   03/22/21 1730  cefTRIAXone (ROCEPHIN) 1 g in sodium chloride 0.9 % 100 mL IVPB        1 g 200 mL/hr over 30 Minutes Intravenous  Once 03/22/21 1715 03/22/21 1857   03/22/21 1730  azithromycin (ZITHROMAX) 500 mg in sodium chloride 0.9 % 250 mL IVPB        500 mg 250 mL/hr over 60 Minutes Intravenous  Once 03/22/21 1715 03/22/21 1905       Subjective:  Patient seen and examined at bedside.  Oriented to self.  Looked comfortable, denied any new complaints.  Objective: Vitals:   03/27/21 0511 03/27/21 0733 03/27/21 1141 03/27/21 1540  BP: 140/71 (!) 147/57 (!) 156/75 123/62  Pulse: 98 90 97 100  Resp: $Remo'14 16 16 18  'nfDmE$ Temp: 99 F (37.2 C) 98.2 F (36.8 C) 98.8 F (37.1 C) 98.2 F (36.8 C)  TempSrc: Oral Oral Oral Oral  SpO2: 92% 94% 96% 94%  Weight: 55.3 kg     Height:        Intake/Output Summary (Last 24 hours) at 03/27/2021 1922 Last data filed at 03/27/2021 1408 Gross per 24 hour  Intake 500 ml  Output 1450 ml  Net -950 ml   Filed Weights   03/23/21 0500 03/24/21 0438 03/27/21 0511  Weight: 55.9 kg 56.1 kg 55.3 kg    Examination: General: NAD, very deconditioned Cardiovascular: S1, S2 present Respiratory: Diminished breath sounds bilaterally Abdomen: Soft, nontender, nondistended, bowel sounds  present Musculoskeletal: No bilateral pedal edema noted Skin: Normal Psychiatry: Normal mood      Data Reviewed: I have personally reviewed following labs and imaging studies  CBC: Recent Labs  Lab 03/22/21 1751 03/23/21 0138 03/24/21 0126  WBC 20.2* 23.3* 10.9*  NEUTROABS 15.5* 19.1* 9.9*  HGB 12.7 12.0 10.4*  HCT 38.5 37.7 32.9*  MCV 88.7 89.5 90.4  PLT 161 138* 492*   Basic Metabolic Panel: Recent Labs  Lab 03/22/21 1751 03/23/21 0138 03/24/21 0126  NA 133* 131* 133*  K 3.8 4.1 3.8  CL 96* 96* 100  CO2 $Re'25 24 27  'PZH$ GLUCOSE 110* 159* 142*  BUN $Re'11 12 15  'iCX$ CREATININE 0.64 0.61 0.58  CALCIUM 9.2 8.8* 8.2*  MG 1.8 1.8  --   PHOS  --  4.0  --    GFR: Estimated Creatinine Clearance: 31 mL/min (by C-G  formula based on SCr of 0.58 mg/dL). Liver Function Tests: Recent Labs  Lab 03/22/21 1751 03/23/21 0138  AST 15 16  ALT 9 7  ALKPHOS 73 56  BILITOT 1.0 0.7  PROT 6.7 6.2*  ALBUMIN 3.5 3.2*   No results for input(s): LIPASE, AMYLASE in the last 168 hours. Recent Labs  Lab 03/23/21 0138  AMMONIA <10   Coagulation Profile: No results for input(s): INR, PROTIME in the last 168 hours. Cardiac Enzymes: Recent Labs  Lab 03/22/21 2000  CKTOTAL 34*   BNP (last 3 results) No results for input(s): PROBNP in the last 8760 hours. HbA1C: No results for input(s): HGBA1C in the last 72 hours. CBG: Recent Labs  Lab 03/22/21 1955  GLUCAP 123*   Lipid Profile: No results for input(s): CHOL, HDL, LDLCALC, TRIG, CHOLHDL, LDLDIRECT in the last 72 hours. Thyroid Function Tests: No results for input(s): TSH, T4TOTAL, FREET4, T3FREE, THYROIDAB in the last 72 hours.  Anemia Panel: No results for input(s): VITAMINB12, FOLATE, FERRITIN, TIBC, IRON, RETICCTPCT in the last 72 hours. Sepsis Labs: Recent Labs  Lab 03/22/21 2000 03/22/21 2035 03/22/21 2247  PROCALCITON 0.12  --   --   LATICACIDVEN  --  1.1 1.0    Recent Results (from the past 240 hour(s))  Blood  culture (routine x 2)     Status: None   Collection Time: 03/22/21  5:52 PM   Specimen: BLOOD  Result Value Ref Range Status   Specimen Description BLOOD SITE NOT SPECIFIED  Final   Special Requests   Final    BOTTLES DRAWN AEROBIC AND ANAEROBIC Blood Culture results may not be optimal due to an excessive volume of blood received in culture bottles   Culture   Final    NO GROWTH 5 DAYS Performed at [facility], [address], [place], [place] [address]    Report Status 03/27/2021 FINAL  Final  Blood culture (routine x 2)     Status: None   Collection Time: 03/22/21  5:53 PM   Specimen: BLOOD  Result Value Ref Range Status   Specimen Description BLOOD SITE NOT SPECIFIED  Final   Special Requests   Final    BOTTLES DRAWN AEROBIC AND ANAEROBIC Blood Culture adequate volume   Culture   Final    NO GROWTH 5 DAYS Performed at [facility], [address] [place], [place] [address]    Report Status 03/27/2021 FINAL  Final  Resp Panel by RT-PCR (Flu A&B, Covid) Nasopharyngeal Swab     Status: Abnormal   Collection Time: 03/22/21  8:44 PM   Specimen: Nasopharyngeal Swab; Nasopharyngeal(NP) swabs in vial transport medium  Result Value Ref Range Status   SARS Coronavirus 2 by RT PCR POSITIVE (A) NEGATIVE Final    Comment: (NOTE) SARS-CoV-2 target nucleic acids are DETECTED.  The SARS-CoV-2 RNA is generally detectable in upper respiratory specimens during the acute phase of infection. Positive results are indicative of the presence of the identified virus, but do not rule out bacterial infection or co-infection with other pathogens not detected by the test. Clinical correlation with patient history and other diagnostic information is necessary to determine patient infection status. The expected result is Negative.  Fact Sheet for Patients: BloggerCourse.com  Fact Sheet for Healthcare Providers: SeriousBroker.it  This  test is not yet approved or cleared by the [place] FDA and  has been authorized for detection and/or diagnosis of SARS-CoV-2 by FDA under an Emergency Use Authorization (EUA).  This EUA will remain in effect (meaning this test can be used) for the duration of  the COVID-19 declaration under Section 564(b)(1) of the A ct, 21 U.S.C. section 360bbb-3(b)(1), unless the authorization is terminated or revoked sooner.     Influenza A by PCR NEGATIVE NEGATIVE Final   Influenza B by PCR NEGATIVE NEGATIVE Final    Comment: (NOTE) The Xpert Xpress SARS-CoV-2/FLU/RSV plus assay is intended as an aid in the diagnosis of influenza from Nasopharyngeal swab specimens and should not be used as a sole basis for treatment. Nasal washings and aspirates are unacceptable for Xpert Xpress SARS-CoV-2/FLU/RSV testing.  Fact Sheet for Patients: EntrepreneurPulse.com.au  Fact Sheet for Healthcare Providers: IncredibleEmployment.be  This test is not yet approved or cleared by the [place] FDA and has been authorized for detection and/or diagnosis of SARS-CoV-2 by FDA under an Emergency Use Authorization (EUA). This EUA will remain in effect (meaning this test can be used) for the duration of the COVID-19 declaration under Section 564(b)(1) of the Act, 21 U.S.C. section 360bbb-3(b)(1), unless the authorization is terminated or revoked.  Performed at [facility], [place] [address], [place], [place] [address]   MRSA Next Gen by PCR, Nasal     Status: None   Collection Time: 03/23/21  9:02 AM   Specimen: Nasal Mucosa; Nasal Swab  Result Value Ref Range Status   MRSA by PCR Next Gen NOT DETECTED NOT DETECTED Final    Comment: (NOTE) The GeneXpert MRSA Assay (FDA approved for NASAL specimens only), is one component of a comprehensive MRSA colonization surveillance program. It is not intended to diagnose MRSA infection nor to guide or monitor treatment for  MRSA infections. Test performance is not FDA approved in patients less than 34 years old. Performed at [facility], [place] [address] [place], [place] [address]   Resp Panel by RT-PCR (Flu A&B, Covid) Nasopharyngeal Swab     Status: None   Collection Time: 03/26/21  4:38 PM   Specimen: Nasopharyngeal Swab; Nasopharyngeal(NP) swabs in vial transport medium  Result Value Ref Range Status   SARS Coronavirus 2 by RT PCR NEGATIVE NEGATIVE Final    Comment: (NOTE) SARS-CoV-2 target nucleic acids are NOT DETECTED.  The SARS-CoV-2 RNA is generally detectable in upper respiratory specimens during the acute phase of infection. The lowest concentration of SARS-CoV-2 viral copies this assay can detect is 138 copies/mL. A negative result does not preclude SARS-Cov-2 infection and should not be used as the sole basis for treatment or other patient management decisions. A negative result may occur with  improper specimen collection/handling, submission of specimen other than nasopharyngeal swab, presence of viral mutation(s) within the areas targeted by this assay, and inadequate number of viral copies(<138 copies/mL). A negative result must be combined with clinical observations, patient history, and epidemiological information. The expected result is Negative.  Fact Sheet for Patients:  EntrepreneurPulse.com.au  Fact Sheet for Healthcare Providers:  IncredibleEmployment.be  This test is no t yet approved or cleared by the [place] FDA and  has been authorized for detection and/or diagnosis of SARS-CoV-2 by FDA under an Emergency Use Authorization (EUA). This EUA will remain  in effect (meaning this test can be used) for the duration of the COVID-19 declaration under Section 564(b)(1) of the Act, 21 U.S.C.section 360bbb-3(b)(1), unless the authorization is terminated  or revoked sooner.       Influenza A by PCR NEGATIVE NEGATIVE Final    Influenza B by PCR NEGATIVE NEGATIVE  Final    Comment: (NOTE) The Xpert Xpress SARS-CoV-2/FLU/RSV plus assay is intended as an aid in the diagnosis of influenza from Nasopharyngeal swab specimens and should not be used as a sole basis for treatment. Nasal washings and aspirates are unacceptable for Xpert Xpress SARS-CoV-2/FLU/RSV testing.  Fact Sheet for Patients: EntrepreneurPulse.com.au  Fact Sheet for Healthcare Providers: IncredibleEmployment.be  This test is not yet approved or cleared by the [place] FDA and has been authorized for detection and/or diagnosis of SARS-CoV-2 by FDA under an Emergency Use Authorization (EUA). This EUA will remain in effect (meaning this test can be used) for the duration of the COVID-19 declaration under Section 564(b)(1) of the Act, 21 U.S.C. section 360bbb-3(b)(1), unless the authorization is terminated or revoked.  Performed at [facility], [place] [address], [place], [place] [address]          Radiology Studies: No results found.      Scheduled Meds:  amLODipine  10 mg Oral Daily   cefdinir  300 mg Oral Q12H   clopidogrel  75 mg Oral q morning   divalproex  250 mg Oral Q12H   levothyroxine  50 mcg Oral Once per day on Mon Tue Wed Thu Fri Sat   pantoprazole sodium  40 mg Oral QPM   Continuous Infusions:     LOS: 5 days       Alma Friendly, MD [facility] P2/02/2021, 7:22 PM

## 2021-03-28 LAB — CBC WITH DIFFERENTIAL/PLATELET
Abs Immature Granulocytes: 0.05 10*3/uL (ref 0.00–0.07)
Basophils Absolute: 0 10*3/uL (ref 0.0–0.1)
Basophils Relative: 0 %
Eosinophils Absolute: 0.1 10*3/uL (ref 0.0–0.5)
Eosinophils Relative: 2 %
HCT: 31.8 % — ABNORMAL LOW (ref 36.0–46.0)
Hemoglobin: 10.4 g/dL — ABNORMAL LOW (ref 12.0–15.0)
Immature Granulocytes: 1 %
Lymphocytes Relative: 29 %
Lymphs Abs: 2 10*3/uL (ref 0.7–4.0)
MCH: 29.1 pg (ref 26.0–34.0)
MCHC: 32.7 g/dL (ref 30.0–36.0)
MCV: 88.8 fL (ref 80.0–100.0)
Monocytes Absolute: 1.1 10*3/uL — ABNORMAL HIGH (ref 0.1–1.0)
Monocytes Relative: 16 %
Neutro Abs: 3.5 10*3/uL (ref 1.7–7.7)
Neutrophils Relative %: 52 %
Platelets: 153 10*3/uL (ref 150–400)
RBC: 3.58 MIL/uL — ABNORMAL LOW (ref 3.87–5.11)
RDW: 13.7 % (ref 11.5–15.5)
WBC: 6.7 10*3/uL (ref 4.0–10.5)
nRBC: 0 % (ref 0.0–0.2)

## 2021-03-28 LAB — BASIC METABOLIC PANEL
Anion gap: 9 (ref 5–15)
BUN: 10 mg/dL (ref 8–23)
CO2: 29 mmol/L (ref 22–32)
Calcium: 8.7 mg/dL — ABNORMAL LOW (ref 8.9–10.3)
Chloride: 97 mmol/L — ABNORMAL LOW (ref 98–111)
Creatinine, Ser: 0.53 mg/dL (ref 0.44–1.00)
GFR, Estimated: >60 mL/min (ref >60)
Glucose, Bld: 101 mg/dL — ABNORMAL HIGH (ref 70–99)
Potassium: 3.6 mmol/L (ref 3.5–5.1)
Sodium: 135 mmol/L (ref 135–145)

## 2021-03-28 NOTE — Progress Notes (Signed)
Occupational Therapy Treatment Patient Details Name: Danielle Bryan MRN: [id number] DOB: [birth year] Today's Date: 03/28/2021   History of present illness Patient is a 88 year old female who presented here with altered mental status from assisted living facility, pneumonia; with history of hypertension, hypothyroidism, TIA, recent COVID infection, chronic diastolic congestive heart failure   OT comments  Patient received in supine and agreeable to OT session. Patient required max assist to get to EOB and demonstrated posterior leaning at first and progresses to min guard to min assist for sitting balance. Patient performed grooming and lateral leaning on EOB to address balance. Patient stood from EOB with max assist with face to face technique and attempted to side step towards HOB. Patient was max assist to return to supine and mod assist to roll to adjust pad. Patient was setup for self feeding using nose cup for drinks.  Patient required meats cut up and initially required min assist to load utensil but progressed to performing on her own with setup. Patient is expected to discharge to SNF.    Recommendations for follow up therapy are one component of a multi-disciplinary discharge planning process, led by the attending physician.  Recommendations may be updated based on patient status, additional functional criteria and insurance authorization.    Follow Up Recommendations  Skilled nursing-short term rehab (<3 hours/day)    Assistance Recommended at Discharge Frequent or constant Supervision/Assistance  Patient can return home with the following  A lot of help with bathing/dressing/bathroom;A lot of help with walking and/or transfers;Assistance with cooking/housework;Assistance with feeding;Help with stairs or ramp for entrance;Assist for transportation;Direct supervision/assist for financial management;Direct supervision/assist for medications management   Equipment Recommendations  Other  (comment) (TBD)    Recommendations for Other Services      Precautions / Restrictions Precautions Precautions: Fall Restrictions Weight Bearing Restrictions: No       Mobility Bed Mobility Overal bed mobility: Needs Assistance Bed Mobility: Supine to Sit, Sit to Supine Rolling: Mod assist   Supine to sit: Max assist Sit to supine: Max assist   General bed mobility comments: verbal cues to get to EOB and required max assist due to assistance with trunk and BLEs    Transfers Overall transfer level: Needs assistance Equipment used: None Transfers: Sit to/from Stand Sit to Stand: Max assist           General transfer comment: stood from EOB with max assist to power up and attempting to side step towards HOB     Balance Overall balance assessment: Needs assistance Sitting-balance support: No upper extremity supported, Feet supported Sitting balance-Leahy Scale: Poor Sitting balance - Comments: intially requied assistance for upright posture. Performed lateral leaning on elbows which resulted in better balance     Standing balance-Leahy Scale: Zero Standing balance comment: max assist                           ADL either performed or assessed with clinical judgement   ADL Overall ADL's : Needs assistance/impaired Eating/Feeding: Minimal assistance;Bed level;With adaptive utensils Eating/Feeding Details (indicate cue type and reason): nosey cup for drink and occasional assist to load utensil Grooming: Set up;Wash/dry face;Sitting Grooming Details (indicate cue type and reason): sitting on EOB                               General ADL Comments: patient able to feed self with meat cut up  an duse of nosey cup    Extremity/Trunk Assessment Upper Extremity Assessment RUE Coordination: decreased gross motor LUE Deficits / Details: ROM is WFL, globally weak LUE Coordination: decreased gross motor            Vision       Perception      Praxis      Cognition Arousal/Alertness: Awake/alert Behavior During Therapy: WFL for tasks assessed/performed Overall Cognitive Status: No family/caregiver present to determine baseline cognitive functioning Area of Impairment: Following commands, Safety/judgement, Problem solving                 Orientation Level: Disoriented to, Time, Situation   Memory: Decreased short-term memory Following Commands: Follows one step commands with increased time Safety/Judgement: Decreased awareness of safety, Decreased awareness of deficits   Problem Solving: Requires verbal cues, Requires tactile cues, Difficulty sequencing, Slow processing General Comments: limited verbalizations but not tearful during OT session        Exercises      Shoulder Instructions       General Comments bilat knee flexion contractures ~10 degrees    Pertinent Vitals/ Pain       Pain Assessment Pain Assessment: Faces Faces Pain Scale: Hurts little more Pain Location: R hand Pain Descriptors / Indicators: Guarding, Grimacing Pain Intervention(s): Monitored during session, Repositioned  Home Living                                          Prior Functioning/Environment              Frequency  Min 2X/week        Progress Toward Goals  OT Goals(current goals can now be found in the care plan section)  Progress towards OT goals: Progressing toward goals  Acute Rehab OT Goals Patient Stated Goal: none stated OT Goal Formulation: With patient Time For Goal Achievement: 04/07/21 Potential to Achieve Goals: Good ADL Goals Pt Will Perform Grooming: with set-up;sitting Pt Will Perform Upper Body Bathing: with set-up;sitting Pt Will Perform Upper Body Dressing: with set-up;sitting Pt Will Transfer to Toilet: with min assist;ambulating  Plan Discharge plan needs to be updated    Co-evaluation                 AM-PAC OT "6 Clicks" Daily Activity     Outcome  Measure   Help from another person eating meals?: A Little Help from another person taking care of personal grooming?: A Little Help from another person toileting, which includes using toliet, bedpan, or urinal?: Total Help from another person bathing (including washing, rinsing, drying)?: Total Help from another person to put on and taking off regular upper body clothing?: Total Help from another person to put on and taking off regular lower body clothing?: Total 6 Click Score: 10    End of Session Equipment Utilized During Treatment: Gait belt  OT Visit Diagnosis: Unsteadiness on feet (R26.81);Other abnormalities of gait and mobility (R26.89);Muscle weakness (generalized) (M62.81)   Activity Tolerance Patient tolerated treatment well   Patient Left in bed;with call bell/phone within reach;with bed alarm set   Nurse Communication Mobility status        Time: 0092-3300 OT Time Calculation (min): 27 min  Charges: OT General Charges $OT Visit: 1 Visit OT Treatments $Self Care/Home Management : 8-22 mins  Lodema Hong, [place]  Pager [phone] Office [phone]  Chandon Lazcano Alexis Goodell 03/28/2021, 2:54 PM

## 2021-03-28 NOTE — TOC Transition Note (Signed)
Transition of Care [facility]) - CM/SW Discharge Note   Patient Details  Name: ERANDI LEMMA MRN: [id number] Date of Birth: Oct 15, 1923  [facility]) CM/SW Contact:  Geralynn Ochs, LCSW Phone Number: 03/28/2021, 11:49 AM   Clinical Narrative:   CSW alerted at the end of the day yesterday about insurance approval. CSW confirmed bed availability for today at [facility], sent discharge information. CSW spoke with [name] to update on discharge, he is agreeable to transfer. Transport scheduled with [facility].  Nurse to call report to [phone].    Final next level of care: Skilled Nursing Facility Barriers to Discharge: Barriers Resolved   Patient Goals and CMS Choice Patient states their goals for this hospitalization and ongoing recovery are:: patient unable to participate in goal setting, not fully oriented CMS Medicare.gov Compare Post Acute Care list provided to:: Patient Represenative (must comment) Choice offered to / list presented to : Adult Children  Discharge Placement              Patient [facility] bed at[facility] Patient to be transferred to facility by: [facility] Name of family member notified: [name] Patient and family notified of of transfer: 03/28/21  Discharge Plan and Services   Discharge Planning Services: CM Consult Post Acute Care Choice: [facility]                               Social Determinants of Health (SDOH) Interventions     Readmission Risk Interventions No flowsheet data found.

## 2021-03-28 NOTE — Progress Notes (Signed)
Physical Therapy Treatment Patient Details Name: Danielle Bryan MRN: [id number] DOB: [birth year] Today's Date: 03/28/2021   History of Present Illness Patient is a 88 year old female who presented here with altered mental status from assisted living facility, pneumonia; with history of hypertension, hypothyroidism, TIA, recent COVID infection, chronic diastolic congestive heart failure    PT Comments    Pt less responsive today vs previous session, still tearful and when PT goes to leave pt tearfully states "don't leave me". Pt requiring mod-max assist for all mobility tasks today, demonstrates sitting tolerance ~5 minutes and standing tolerance <1 minute given fatigue. Pt not progressing today, will continue to follow and progress as pt tolerates.     Recommendations for follow up therapy are one component of a multi-disciplinary discharge planning process, led by the attending physician.  Recommendations may be updated based on patient status, additional functional criteria and insurance authorization.  Follow Up Recommendations  Skilled nursing-short term rehab (<3 hours/day)     Assistance Recommended at Discharge Frequent or constant Supervision/Assistance  Patient can return home with the following A lot of help with walking and/or transfers;A lot of help with bathing/dressing/bathroom;Assistance with feeding   Equipment Recommendations  Wheelchair (measurements PT);Wheelchair cushion (measurements PT);Hospital bed    Recommendations for Other Services       Precautions / Restrictions Precautions Precautions: Fall Restrictions Weight Bearing Restrictions: No     Mobility  Bed Mobility Overal bed mobility: Needs Assistance Bed Mobility: Supine to Sit, Sit to Supine Rolling: Mod assist   Supine to sit: Max assist Sit to supine: Max assist   General bed mobility comments: max assist for supine<>sit all aspects including boost up in bed, cues for sequencing     Transfers Overall transfer level: Needs assistance Equipment used: Rolling walker (2 wheels) Transfers: Sit to/from Stand Sit to Stand: Max assist           General transfer comment: max assist for power up, rise, steadying upon standing with PT supporting pt under ischial tuberosities. Standing tolerance x45 seconds while new pad being placed    Ambulation/Gait               General Gait Details: nt   Marine scientist Rankin (Stroke Patients Only)       Balance Overall balance assessment: Needs assistance Sitting-balance support: No upper extremity supported, Feet supported Sitting balance-Leahy Scale: Poor Sitting balance - Comments: periods of fair balance, mostly poor requiring truncal support     Standing balance-Leahy Scale: Zero Standing balance comment: max assist                            Cognition Arousal/Alertness: Awake/alert Behavior During Therapy: WFL for tasks assessed/performed Overall Cognitive Status: No family/caregiver present to determine baseline cognitive functioning Area of Impairment: Following commands, Safety/judgement, Problem solving                       Following Commands: Follows one step commands with increased time Safety/Judgement: Decreased awareness of safety, Decreased awareness of deficits   Problem Solving: Requires verbal cues, Requires tactile cues, Difficulty sequencing, Slow processing General Comments: Pt with limited verbalizations, tearful during session and states at end "don't leave me!" through tears        Exercises      General Comments General comments (skin integrity, edema,  etc.): bilat knee flexion contractures ~10 degrees      Pertinent Vitals/Pain Pain Assessment Pain Assessment: Faces Faces Pain Scale: Hurts little more Pain Location: R hand Pain Descriptors / Indicators: Guarding, Grimacing Pain Intervention(s): Limited  activity within patient's tolerance, Monitored during session    Home Living                          Prior Function            PT Goals (current goals can now be found in the care plan section) Acute Rehab PT Goals Patient Stated Goal: Did not state, but agreeable to getting OOB PT Goal Formulation: Patient unable to participate in goal setting Time For Goal Achievement: 04/07/21 Potential to Achieve Goals: Fair Progress towards PT goals: Not progressing toward goals - comment    Frequency    Min 2X/week      PT Plan Current plan remains appropriate    Co-evaluation              AM-PAC PT "6 Clicks" Mobility   Outcome Measure  Help needed turning from your back to your side while in a flat bed without using bedrails?: A Lot Help needed moving from lying on your back to sitting on the side of a flat bed without using bedrails?: A Lot Help needed moving to and from a bed to a chair (including a wheelchair)?: Total Help needed standing up from a chair using your arms (e.g., wheelchair or bedside chair)?: Total Help needed to walk in hospital room?: Total Help needed climbing 3-5 steps with a railing? : Total 6 Click Score: 8    End of Session   Activity Tolerance: Patient limited by fatigue Patient left: in bed;with bed alarm set;with call bell/phone within reach Nurse Communication: Mobility status PT Visit Diagnosis: Unsteadiness on feet (R26.81);Other abnormalities of gait and mobility (R26.89);Muscle weakness (generalized) (M62.81)     Time: 7948-0165 PT Time Calculation (min) (ACUTE ONLY): 20 min  Charges:  $Therapeutic Activity: 8-22 mins                     Stacie Glaze, PT DPT Acute Rehabilitation Services Pager [phone]  Office [phone]    Benson E Ruffin Pyo 03/28/2021, 2:00 PM

## 2021-03-29 DIAGNOSIS — Z8673 Personal history of transient ischemic attack (TIA), and cerebral infarction without residual deficits: Secondary | ICD-10-CM | POA: Diagnosis not present

## 2021-03-29 DIAGNOSIS — R41841 Cognitive communication deficit: Secondary | ICD-10-CM | POA: Diagnosis not present

## 2021-03-29 DIAGNOSIS — A419 Sepsis, unspecified organism: Secondary | ICD-10-CM | POA: Diagnosis not present

## 2021-03-29 DIAGNOSIS — Z7401 Bed confinement status: Secondary | ICD-10-CM | POA: Diagnosis not present

## 2021-03-29 DIAGNOSIS — J1282 Pneumonia due to coronavirus disease 2019: Secondary | ICD-10-CM | POA: Diagnosis not present

## 2021-03-29 DIAGNOSIS — I69398 Other sequelae of cerebral infarction: Secondary | ICD-10-CM | POA: Diagnosis not present

## 2021-03-29 DIAGNOSIS — I130 Hypertensive heart and chronic kidney disease with heart failure and stage 1 through stage 4 chronic kidney disease, or unspecified chronic kidney disease: Secondary | ICD-10-CM | POA: Diagnosis not present

## 2021-03-29 DIAGNOSIS — E559 Vitamin D deficiency, unspecified: Secondary | ICD-10-CM | POA: Diagnosis not present

## 2021-03-29 DIAGNOSIS — M2550 Pain in unspecified joint: Secondary | ICD-10-CM | POA: Diagnosis not present

## 2021-03-29 DIAGNOSIS — Z8616 Personal history of COVID-19: Secondary | ICD-10-CM | POA: Diagnosis not present

## 2021-03-29 DIAGNOSIS — J189 Pneumonia, unspecified organism: Secondary | ICD-10-CM | POA: Diagnosis not present

## 2021-03-29 DIAGNOSIS — U071 COVID-19: Secondary | ICD-10-CM | POA: Diagnosis not present

## 2021-03-29 DIAGNOSIS — F339 Major depressive disorder, recurrent, unspecified: Secondary | ICD-10-CM | POA: Diagnosis not present

## 2021-03-29 DIAGNOSIS — E039 Hypothyroidism, unspecified: Secondary | ICD-10-CM | POA: Diagnosis not present

## 2021-03-29 DIAGNOSIS — E119 Type 2 diabetes mellitus without complications: Secondary | ICD-10-CM | POA: Diagnosis not present

## 2021-03-29 DIAGNOSIS — G9341 Metabolic encephalopathy: Secondary | ICD-10-CM | POA: Diagnosis not present

## 2021-03-29 DIAGNOSIS — Z8701 Personal history of pneumonia (recurrent): Secondary | ICD-10-CM | POA: Diagnosis not present

## 2021-03-29 DIAGNOSIS — E785 Hyperlipidemia, unspecified: Secondary | ICD-10-CM | POA: Diagnosis not present

## 2021-03-29 DIAGNOSIS — I69891 Dysphagia following other cerebrovascular disease: Secondary | ICD-10-CM | POA: Diagnosis not present

## 2021-03-29 DIAGNOSIS — I10 Essential (primary) hypertension: Secondary | ICD-10-CM | POA: Diagnosis not present

## 2021-03-29 DIAGNOSIS — Z515 Encounter for palliative care: Secondary | ICD-10-CM | POA: Diagnosis not present

## 2021-03-29 DIAGNOSIS — R2681 Unsteadiness on feet: Secondary | ICD-10-CM | POA: Diagnosis not present

## 2021-03-29 DIAGNOSIS — R5381 Other malaise: Secondary | ICD-10-CM | POA: Diagnosis not present

## 2021-03-29 DIAGNOSIS — Z9181 History of falling: Secondary | ICD-10-CM | POA: Diagnosis not present

## 2021-03-29 DIAGNOSIS — I69828 Other speech and language deficits following other cerebrovascular disease: Secondary | ICD-10-CM | POA: Diagnosis not present

## 2021-03-29 DIAGNOSIS — R1312 Dysphagia, oropharyngeal phase: Secondary | ICD-10-CM | POA: Diagnosis not present

## 2021-03-29 DIAGNOSIS — M6281 Muscle weakness (generalized): Secondary | ICD-10-CM | POA: Diagnosis not present

## 2021-03-29 DIAGNOSIS — I5032 Chronic diastolic (congestive) heart failure: Secondary | ICD-10-CM | POA: Diagnosis not present

## 2021-03-29 LAB — BASIC METABOLIC PANEL
Anion gap: 9 (ref 5–15)
BUN: 10 mg/dL (ref 8–23)
CO2: 29 mmol/L (ref 22–32)
Calcium: 8.8 mg/dL — ABNORMAL LOW (ref 8.9–10.3)
Chloride: 97 mmol/L — ABNORMAL LOW (ref 98–111)
Creatinine, Ser: 0.57 mg/dL (ref 0.44–1.00)
GFR, Estimated: >60 mL/min (ref >60)
Glucose, Bld: 101 mg/dL — ABNORMAL HIGH (ref 70–99)
Potassium: 3.8 mmol/L (ref 3.5–5.1)
Sodium: 135 mmol/L (ref 135–145)

## 2021-03-29 LAB — CBC WITH DIFFERENTIAL/PLATELET
Abs Immature Granulocytes: 0.06 10*3/uL (ref 0.00–0.07)
Basophils Absolute: 0 10*3/uL (ref 0.0–0.1)
Basophils Relative: 0 %
Eosinophils Absolute: 0.1 10*3/uL (ref 0.0–0.5)
Eosinophils Relative: 2 %
HCT: 32.4 % — ABNORMAL LOW (ref 36.0–46.0)
Hemoglobin: 10.3 g/dL — ABNORMAL LOW (ref 12.0–15.0)
Immature Granulocytes: 1 %
Lymphocytes Relative: 33 %
Lymphs Abs: 2.1 10*3/uL (ref 0.7–4.0)
MCH: 28.2 pg (ref 26.0–34.0)
MCHC: 31.8 g/dL (ref 30.0–36.0)
MCV: 88.8 fL (ref 80.0–100.0)
Monocytes Absolute: 1 10*3/uL (ref 0.1–1.0)
Monocytes Relative: 15 %
Neutro Abs: 3.1 10*3/uL (ref 1.7–7.7)
Neutrophils Relative %: 49 %
Platelets: 182 10*3/uL (ref 150–400)
RBC: 3.65 MIL/uL — ABNORMAL LOW (ref 3.87–5.11)
RDW: 13.7 % (ref 11.5–15.5)
WBC: 6.3 10*3/uL (ref 4.0–10.5)
nRBC: 0 % (ref 0.0–0.2)

## 2021-04-04 DIAGNOSIS — Z8701 Personal history of pneumonia (recurrent): Secondary | ICD-10-CM | POA: Diagnosis not present

## 2021-04-04 DIAGNOSIS — Z8673 Personal history of transient ischemic attack (TIA), and cerebral infarction without residual deficits: Secondary | ICD-10-CM | POA: Diagnosis not present

## 2021-04-04 DIAGNOSIS — I5032 Chronic diastolic (congestive) heart failure: Secondary | ICD-10-CM | POA: Diagnosis not present

## 2021-04-04 DIAGNOSIS — Z8616 Personal history of COVID-19: Secondary | ICD-10-CM | POA: Diagnosis not present

## 2021-04-04 DIAGNOSIS — R5381 Other malaise: Secondary | ICD-10-CM | POA: Diagnosis not present

## 2021-04-04 DIAGNOSIS — Z515 Encounter for palliative care: Secondary | ICD-10-CM | POA: Diagnosis not present

## 2021-04-09 DIAGNOSIS — J189 Pneumonia, unspecified organism: Secondary | ICD-10-CM | POA: Diagnosis not present

## 2021-04-09 DIAGNOSIS — I130 Hypertensive heart and chronic kidney disease with heart failure and stage 1 through stage 4 chronic kidney disease, or unspecified chronic kidney disease: Secondary | ICD-10-CM | POA: Diagnosis not present

## 2021-04-09 DIAGNOSIS — J1282 Pneumonia due to coronavirus disease 2019: Secondary | ICD-10-CM | POA: Diagnosis not present

## 2021-04-09 DIAGNOSIS — G9341 Metabolic encephalopathy: Secondary | ICD-10-CM | POA: Diagnosis not present

## 2021-04-09 DIAGNOSIS — I69891 Dysphagia following other cerebrovascular disease: Secondary | ICD-10-CM | POA: Diagnosis not present

## 2021-04-09 DIAGNOSIS — M6281 Muscle weakness (generalized): Secondary | ICD-10-CM | POA: Diagnosis not present

## 2021-04-09 DIAGNOSIS — U071 COVID-19: Secondary | ICD-10-CM | POA: Diagnosis not present

## 2021-04-09 DIAGNOSIS — I69398 Other sequelae of cerebral infarction: Secondary | ICD-10-CM | POA: Diagnosis not present

## 2021-04-09 DIAGNOSIS — E039 Hypothyroidism, unspecified: Secondary | ICD-10-CM | POA: Diagnosis not present

## 2021-04-09 DIAGNOSIS — A419 Sepsis, unspecified organism: Secondary | ICD-10-CM | POA: Diagnosis not present

## 2021-04-11 DIAGNOSIS — J189 Pneumonia, unspecified organism: Secondary | ICD-10-CM | POA: Diagnosis not present

## 2021-04-11 DIAGNOSIS — I69398 Other sequelae of cerebral infarction: Secondary | ICD-10-CM | POA: Diagnosis not present

## 2021-04-11 DIAGNOSIS — I69891 Dysphagia following other cerebrovascular disease: Secondary | ICD-10-CM | POA: Diagnosis not present

## 2021-04-11 DIAGNOSIS — M6281 Muscle weakness (generalized): Secondary | ICD-10-CM | POA: Diagnosis not present

## 2021-04-11 DIAGNOSIS — G9341 Metabolic encephalopathy: Secondary | ICD-10-CM | POA: Diagnosis not present

## 2021-04-17 DIAGNOSIS — J189 Pneumonia, unspecified organism: Secondary | ICD-10-CM | POA: Diagnosis not present

## 2021-04-17 DIAGNOSIS — M6281 Muscle weakness (generalized): Secondary | ICD-10-CM | POA: Diagnosis not present

## 2021-04-17 DIAGNOSIS — G9341 Metabolic encephalopathy: Secondary | ICD-10-CM | POA: Diagnosis not present

## 2021-04-17 DIAGNOSIS — I69891 Dysphagia following other cerebrovascular disease: Secondary | ICD-10-CM | POA: Diagnosis not present

## 2021-04-17 DIAGNOSIS — I69398 Other sequelae of cerebral infarction: Secondary | ICD-10-CM | POA: Diagnosis not present

## 2021-04-18 ENCOUNTER — Encounter (HOSPITAL_COMMUNITY): Payer: Self-pay | Admitting: Radiology

## 2021-04-19 DIAGNOSIS — J189 Pneumonia, unspecified organism: Secondary | ICD-10-CM | POA: Diagnosis not present

## 2021-04-19 DIAGNOSIS — G9341 Metabolic encephalopathy: Secondary | ICD-10-CM | POA: Diagnosis not present

## 2021-04-19 DIAGNOSIS — I69398 Other sequelae of cerebral infarction: Secondary | ICD-10-CM | POA: Diagnosis not present

## 2021-04-19 DIAGNOSIS — I69891 Dysphagia following other cerebrovascular disease: Secondary | ICD-10-CM | POA: Diagnosis not present

## 2021-04-19 DIAGNOSIS — M6281 Muscle weakness (generalized): Secondary | ICD-10-CM | POA: Diagnosis not present

## 2021-04-23 DIAGNOSIS — E785 Hyperlipidemia, unspecified: Secondary | ICD-10-CM | POA: Diagnosis not present

## 2021-04-23 DIAGNOSIS — E559 Vitamin D deficiency, unspecified: Secondary | ICD-10-CM | POA: Diagnosis not present

## 2021-04-30 DIAGNOSIS — M6281 Muscle weakness (generalized): Secondary | ICD-10-CM | POA: Diagnosis not present

## 2021-04-30 DIAGNOSIS — E785 Hyperlipidemia, unspecified: Secondary | ICD-10-CM | POA: Diagnosis not present

## 2021-04-30 DIAGNOSIS — J189 Pneumonia, unspecified organism: Secondary | ICD-10-CM | POA: Diagnosis not present

## 2021-04-30 DIAGNOSIS — A419 Sepsis, unspecified organism: Secondary | ICD-10-CM | POA: Diagnosis not present

## 2021-04-30 DIAGNOSIS — I69398 Other sequelae of cerebral infarction: Secondary | ICD-10-CM | POA: Diagnosis not present

## 2021-04-30 DIAGNOSIS — E559 Vitamin D deficiency, unspecified: Secondary | ICD-10-CM | POA: Diagnosis not present

## 2021-04-30 DIAGNOSIS — G9341 Metabolic encephalopathy: Secondary | ICD-10-CM | POA: Diagnosis not present

## 2021-04-30 DIAGNOSIS — I130 Hypertensive heart and chronic kidney disease with heart failure and stage 1 through stage 4 chronic kidney disease, or unspecified chronic kidney disease: Secondary | ICD-10-CM | POA: Diagnosis not present

## 2021-04-30 DIAGNOSIS — I69891 Dysphagia following other cerebrovascular disease: Secondary | ICD-10-CM | POA: Diagnosis not present

## 2021-05-01 DIAGNOSIS — E785 Hyperlipidemia, unspecified: Secondary | ICD-10-CM | POA: Diagnosis not present

## 2021-05-01 DIAGNOSIS — F339 Major depressive disorder, recurrent, unspecified: Secondary | ICD-10-CM | POA: Diagnosis not present

## 2021-05-01 DIAGNOSIS — E559 Vitamin D deficiency, unspecified: Secondary | ICD-10-CM | POA: Diagnosis not present

## 2021-05-01 DIAGNOSIS — Z8673 Personal history of transient ischemic attack (TIA), and cerebral infarction without residual deficits: Secondary | ICD-10-CM | POA: Diagnosis not present

## 2021-05-03 DIAGNOSIS — G9340 Encephalopathy, unspecified: Secondary | ICD-10-CM | POA: Diagnosis not present

## 2021-05-22 DIAGNOSIS — I10 Essential (primary) hypertension: Secondary | ICD-10-CM | POA: Diagnosis not present

## 2021-05-22 DIAGNOSIS — M1990 Unspecified osteoarthritis, unspecified site: Secondary | ICD-10-CM | POA: Diagnosis not present

## 2021-05-28 DIAGNOSIS — U071 COVID-19: Secondary | ICD-10-CM | POA: Diagnosis not present

## 2021-05-28 DIAGNOSIS — J1282 Pneumonia due to coronavirus disease 2019: Secondary | ICD-10-CM | POA: Diagnosis not present

## 2021-05-28 DIAGNOSIS — G9341 Metabolic encephalopathy: Secondary | ICD-10-CM | POA: Diagnosis not present

## 2021-06-21 DIAGNOSIS — M1990 Unspecified osteoarthritis, unspecified site: Secondary | ICD-10-CM | POA: Diagnosis not present

## 2021-06-21 DIAGNOSIS — I10 Essential (primary) hypertension: Secondary | ICD-10-CM | POA: Diagnosis not present

## 2021-07-19 DIAGNOSIS — I10 Essential (primary) hypertension: Secondary | ICD-10-CM | POA: Diagnosis not present

## 2021-07-19 DIAGNOSIS — M1990 Unspecified osteoarthritis, unspecified site: Secondary | ICD-10-CM | POA: Diagnosis not present

## 2021-09-14 DIAGNOSIS — M19011 Primary osteoarthritis, right shoulder: Secondary | ICD-10-CM | POA: Diagnosis not present

## 2021-09-14 DIAGNOSIS — M19012 Primary osteoarthritis, left shoulder: Secondary | ICD-10-CM | POA: Diagnosis not present

## 2021-09-17 DIAGNOSIS — E038 Other specified hypothyroidism: Secondary | ICD-10-CM | POA: Diagnosis not present

## 2021-09-17 DIAGNOSIS — M1712 Unilateral primary osteoarthritis, left knee: Secondary | ICD-10-CM | POA: Diagnosis not present

## 2021-10-16 DIAGNOSIS — E038 Other specified hypothyroidism: Secondary | ICD-10-CM | POA: Diagnosis not present

## 2021-10-16 DIAGNOSIS — M1712 Unilateral primary osteoarthritis, left knee: Secondary | ICD-10-CM | POA: Diagnosis not present

## 2021-12-11 DIAGNOSIS — E038 Other specified hypothyroidism: Secondary | ICD-10-CM | POA: Diagnosis not present

## 2021-12-11 DIAGNOSIS — R54 Age-related physical debility: Secondary | ICD-10-CM | POA: Diagnosis not present

## 2021-12-17 DIAGNOSIS — Z79899 Other long term (current) drug therapy: Secondary | ICD-10-CM | POA: Diagnosis not present

## 2021-12-17 DIAGNOSIS — E559 Vitamin D deficiency, unspecified: Secondary | ICD-10-CM | POA: Diagnosis not present

## 2021-12-17 DIAGNOSIS — E038 Other specified hypothyroidism: Secondary | ICD-10-CM | POA: Diagnosis not present

## 2021-12-17 DIAGNOSIS — E782 Mixed hyperlipidemia: Secondary | ICD-10-CM | POA: Diagnosis not present

## 2021-12-22 ENCOUNTER — Emergency Department (HOSPITAL_COMMUNITY): Payer: Medicare HMO

## 2021-12-22 ENCOUNTER — Encounter (HOSPITAL_COMMUNITY): Payer: Self-pay

## 2021-12-22 ENCOUNTER — Observation Stay (HOSPITAL_COMMUNITY)
Admission: EM | Admit: 2021-12-22 | Discharge: 2021-12-24 | Disposition: A | Discharge status: Home Health | Payer: Medicare HMO | Attending: Internal Medicine | Admitting: Internal Medicine

## 2021-12-22 DIAGNOSIS — R4182 Altered mental status, unspecified: Secondary | ICD-10-CM | POA: Insufficient documentation

## 2021-12-22 DIAGNOSIS — Z8673 Personal history of transient ischemic attack (TIA), and cerebral infarction without residual deficits: Secondary | ICD-10-CM

## 2021-12-22 DIAGNOSIS — E039 Hypothyroidism, unspecified: Secondary | ICD-10-CM | POA: Diagnosis present

## 2021-12-22 DIAGNOSIS — Z87898 Personal history of other specified conditions: Secondary | ICD-10-CM

## 2021-12-22 DIAGNOSIS — Z66 Do not resuscitate: Secondary | ICD-10-CM | POA: Insufficient documentation

## 2021-12-22 DIAGNOSIS — Z79899 Other long term (current) drug therapy: Secondary | ICD-10-CM | POA: Insufficient documentation

## 2021-12-22 DIAGNOSIS — R7989 Other specified abnormal findings of blood chemistry: Secondary | ICD-10-CM

## 2021-12-22 DIAGNOSIS — Z7902 Long term (current) use of antithrombotics/antiplatelets: Secondary | ICD-10-CM | POA: Diagnosis not present

## 2021-12-22 DIAGNOSIS — N390 Urinary tract infection, site not specified: Secondary | ICD-10-CM | POA: Diagnosis present

## 2021-12-22 DIAGNOSIS — N3000 Acute cystitis without hematuria: Secondary | ICD-10-CM | POA: Insufficient documentation

## 2021-12-22 DIAGNOSIS — G9341 Metabolic encephalopathy: Secondary | ICD-10-CM | POA: Diagnosis not present

## 2021-12-22 DIAGNOSIS — I10 Essential (primary) hypertension: Secondary | ICD-10-CM | POA: Diagnosis not present

## 2021-12-22 DIAGNOSIS — R946 Abnormal results of thyroid function studies: Secondary | ICD-10-CM | POA: Insufficient documentation

## 2021-12-22 DIAGNOSIS — Z1152 Encounter for screening for COVID-19: Secondary | ICD-10-CM | POA: Insufficient documentation

## 2021-12-22 DIAGNOSIS — E7800 Pure hypercholesterolemia, unspecified: Secondary | ICD-10-CM | POA: Diagnosis not present

## 2021-12-22 DIAGNOSIS — R110 Nausea: Secondary | ICD-10-CM | POA: Diagnosis not present

## 2021-12-22 DIAGNOSIS — R000 Tachycardia, unspecified: Secondary | ICD-10-CM | POA: Diagnosis not present

## 2021-12-22 LAB — CBC WITH DIFFERENTIAL/PLATELET
Abs Immature Granulocytes: 0.03 10*3/uL (ref 0.00–0.07)
Basophils Absolute: 0 10*3/uL (ref 0.0–0.1)
Basophils Relative: 0 %
Eosinophils Absolute: 0 10*3/uL (ref 0.0–0.5)
Eosinophils Relative: 0 %
HCT: 41.6 % (ref 36.0–46.0)
Hemoglobin: 13.7 g/dL (ref 12.0–15.0)
Immature Granulocytes: 1 %
Lymphocytes Relative: 23 %
Lymphs Abs: 1.4 10*3/uL (ref 0.7–4.0)
MCH: 30.2 pg (ref 26.0–34.0)
MCHC: 32.9 g/dL (ref 30.0–36.0)
MCV: 91.8 fL (ref 80.0–100.0)
Monocytes Absolute: 0.6 10*3/uL (ref 0.1–1.0)
Monocytes Relative: 10 %
Neutro Abs: 4 10*3/uL (ref 1.7–7.7)
Neutrophils Relative %: 66 %
Platelets: 151 10*3/uL (ref 150–400)
RBC: 4.53 MIL/uL (ref 3.87–5.11)
RDW: 14.2 % (ref 11.5–15.5)
WBC: 6.1 10*3/uL (ref 4.0–10.5)
nRBC: 0 % (ref 0.0–0.2)

## 2021-12-22 LAB — COMPREHENSIVE METABOLIC PANEL
ALT: 6 U/L (ref 0–44)
AST: 16 U/L (ref 15–41)
Albumin: 3.5 g/dL (ref 3.5–5.0)
Alkaline Phosphatase: 44 U/L (ref 38–126)
Anion gap: 11 (ref 5–15)
BUN: 10 mg/dL (ref 8–23)
CO2: 27 mmol/L (ref 22–32)
Calcium: 9.4 mg/dL (ref 8.9–10.3)
Chloride: 98 mmol/L (ref 98–111)
Creatinine, Ser: 0.61 mg/dL (ref 0.44–1.00)
GFR, Estimated: >60 mL/min (ref >60)
Glucose, Bld: 114 mg/dL — ABNORMAL HIGH (ref 70–99)
Potassium: 3.7 mmol/L (ref 3.5–5.1)
Sodium: 136 mmol/L (ref 135–145)
Total Bilirubin: 0.5 mg/dL (ref 0.3–1.2)
Total Protein: 6.2 g/dL — ABNORMAL LOW (ref 6.5–8.1)

## 2021-12-22 LAB — RESP PANEL BY RT-PCR (RSV, FLU A&B, COVID)  RVPGX2
Influenza A by PCR: NEGATIVE
Influenza B by PCR: NEGATIVE
Resp Syncytial Virus by PCR: NEGATIVE
SARS Coronavirus 2 by RT PCR: NEGATIVE

## 2021-12-22 LAB — URINALYSIS, ROUTINE W REFLEX MICROSCOPIC
Bilirubin Urine: NEGATIVE
Glucose, UA: NEGATIVE mg/dL
Hgb urine dipstick: NEGATIVE
Ketones, ur: 5 mg/dL — AB
Nitrite: NEGATIVE
Protein, ur: 100 mg/dL — AB
Specific Gravity, Urine: 1.027 (ref 1.005–1.030)
WBC, UA: >50 WBC/hpf — ABNORMAL HIGH (ref 0–5)
pH: 5 (ref 5.0–8.0)

## 2021-12-22 LAB — MAGNESIUM: Magnesium: 1.8 mg/dL (ref 1.7–2.4)

## 2021-12-22 LAB — TROPONIN I (HIGH SENSITIVITY)
Troponin I (High Sensitivity): 8 ng/L (ref <18)
Troponin I (High Sensitivity): 8 ng/L (ref <18)

## 2021-12-22 LAB — T4, FREE: Free T4: 1.4 ng/dL — ABNORMAL HIGH (ref 0.61–1.12)

## 2021-12-22 LAB — TSH: TSH: 7.842 u[IU]/mL — ABNORMAL HIGH (ref 0.350–4.500)

## 2021-12-22 MED ORDER — AMLODIPINE BESYLATE 10 MG PO TABS
10.0000 mg | ORAL_TABLET | Freq: Every day | ORAL | Status: DC
  Administered 2021-12-24: 10 mg via ORAL
  Filled 2021-12-22: qty 1

## 2021-12-22 MED ORDER — ACETAMINOPHEN 650 MG RE SUPP: 650.0000 mg | Freq: Four times a day (QID) | RECTAL | Status: DC | PRN

## 2021-12-22 MED ORDER — ONDANSETRON HCL 4 MG/2ML IJ SOLN: 4.0000 mg | Freq: Four times a day (QID) | INTRAMUSCULAR | Status: DC | PRN

## 2021-12-22 MED ORDER — CLOPIDOGREL BISULFATE 75 MG PO TABS
75.0000 mg | ORAL_TABLET | Freq: Every morning | ORAL | Status: DC
  Administered 2021-12-24: 75 mg via ORAL
  Filled 2021-12-22: qty 1

## 2021-12-22 MED ORDER — ROSUVASTATIN CALCIUM 5 MG PO TABS
10.0000 mg | ORAL_TABLET | Freq: Every day | ORAL | Status: DC
  Administered 2021-12-23: 10 mg via ORAL
  Filled 2021-12-22: qty 2

## 2021-12-22 MED ORDER — ONDANSETRON HCL 4 MG PO TABS: 4.0000 mg | ORAL_TABLET | Freq: Four times a day (QID) | ORAL | Status: DC | PRN

## 2021-12-22 MED ORDER — LACTATED RINGERS IV SOLN: INTRAVENOUS | Status: AC

## 2021-12-22 MED ORDER — ACETAMINOPHEN 325 MG PO TABS
650.0000 mg | ORAL_TABLET | Freq: Four times a day (QID) | ORAL | Status: DC | PRN
  Filled 2021-12-22: qty 2

## 2021-12-22 MED ORDER — SENNOSIDES-DOCUSATE SODIUM 8.6-50 MG PO TABS: 1.0000 | ORAL_TABLET | Freq: Every evening | ORAL | Status: DC | PRN

## 2021-12-22 MED ORDER — SODIUM CHLORIDE 0.9 % IV SOLN
1.0000 g | INTRAVENOUS | Status: DC
  Administered 2021-12-23 – 2021-12-24 (×2): 1 g via INTRAVENOUS
  Filled 2021-12-22 (×2): qty 10

## 2021-12-22 MED ORDER — DIVALPROEX SODIUM ER 500 MG PO TB24
500.0000 mg | ORAL_TABLET | Freq: Every morning | ORAL | Status: DC
  Administered 2021-12-24: 500 mg via ORAL
  Filled 2021-12-22 (×2): qty 1

## 2021-12-22 MED ORDER — SERTRALINE HCL 25 MG PO TABS
50.0000 mg | ORAL_TABLET | Freq: Every day | ORAL | Status: DC
  Administered 2021-12-24: 50 mg via ORAL
  Filled 2021-12-22: qty 2

## 2021-12-22 MED ORDER — ENOXAPARIN SODIUM 30 MG/0.3ML IJ SOSY
30.0000 mg | PREFILLED_SYRINGE | INTRAMUSCULAR | Status: DC
  Administered 2021-12-23: 30 mg via SUBCUTANEOUS
  Filled 2021-12-22: qty 0.3

## 2021-12-22 MED ORDER — SODIUM CHLORIDE 0.9 % IV SOLN
1.0000 g | Freq: Once | INTRAVENOUS | Status: AC
  Administered 2021-12-22: 1 g via INTRAVENOUS
  Filled 2021-12-22: qty 10

## 2021-12-22 NOTE — Assessment & Plan Note (Signed)
-   Continue amlodipine ?

## 2021-12-22 NOTE — Assessment & Plan Note (Addendum)
UTI Acute metabolic encephalopathy suspect due to UTI and dehydration.  CT head negative.  Mental status improving after receiving antibiotics. -Continue IV ceftriaxone -Add on urine culture -Gentle IV fluid hydration overnight

## 2021-12-22 NOTE — Assessment & Plan Note (Signed)
Continue Depakote.

## 2021-12-22 NOTE — H&P (Signed)
History and Physical    AIRICA Bryan CHY:[id number] DOB: January 19, 1924 DOA: 12/22/2021  PCP: Velna Hatchet, MD  Patient coming from: [facility]  I have personally briefly reviewed patient's old medical records in [place]  Chief Complaint: Altered mental status  HPI: Danielle Bryan is a 88 y.o. female with medical history significant for history of CVA, HTN, HLD, hypothyroidism, seizure-like activity on Depakote who presented from her nursing facility for evaluation of altered mental status.  History is supplemented by son at bedside.  Per son, patient witnessed at her usual baseline day prior to admission (10/28).  She is normally AOx4 and requires a wheelchair at baseline.  When her son went to visit her today he noticed she was less interactive and clearly confused compared to her baseline.  She was complaining of nausea but did not have any emesis.  EMS were called.  She was able to ambulate with assistance.  She was however alert and oriented to self only.  She was brought to the ED for further evaluation.  ED Course  Labs/Imaging on admission: I have personally reviewed following labs and imaging studies.  Initial vitals showed BP 142/87, pulse 106, RR 20, temp 97.3 F, SPO2 99% on room air.  Labs show WBC 6.1, hemoglobin 13.7, platelets 151,000, sodium 136, potassium 3.7, bicarb 27, BUN 10, creatinine 0.61, serum glucose 114, LFTs within normal limits, magnesium 1.8, TSH 7.842, free T4 in process.  SARS-CoV-2 and influenza PCR negative.  Urinalysis shows negative nitrates, moderate leukocytes, 11-20 RBC/hpf, >50 WBC/hpf, few bacteria on microscopy.  Troponin negative x2.  Portable chest x-ray negative for focal consolidation, edema, effusion.  CT head without contrast negative for acute intracranial normalities.  Patient was given IV ceftriaxone in the hospitalist service was consulted to admit for further evaluation and management.  Review of Systems: All  systems reviewed and are negative except as documented in history of present illness above.   Past Medical History:  Diagnosis Date   Hypercholesterolemia    Hypertension    Hypothyroidism    Osteoarthritis    Reactive depression (situational)    Stroke ([place])    TIA (transient ischemic attack)    on plavix    Past Surgical History:  Procedure Laterality Date   CATARACT EXTRACTION Left 1992   CATARACT EXTRACTION Right 1995   DEBRIDEMENT AND CLOSURE WOUND      Social History:  reports that she has never smoked. She has never used smokeless tobacco. She reports that she does not drink alcohol and does not use drugs.  Allergies  Allergen Reactions   Escitalopram Oxalate Nausea Only   Celexa [Citalopram] Other (See Comments)    Unknown   Escitalopram Other (See Comments)    unknown   Lisinopril Other (See Comments)    Very decreased blood pressure    Statins Other (See Comments)    myalgias   Sulfa Antibiotics Hives and Other (See Comments)    Family History  Problem Relation Age of Onset   Heart failure Mother    Pneumonia Mother    Heart failure Father      Prior to Admission medications   Medication Sig Start Date End Date Taking? Authorizing Provider  acetaminophen (TYLENOL) 500 MG tablet Take 1,000 mg by mouth See admin instructions. Take 2 tablets (1000 mg) by mouth every morning, may also take 2 tablets (1000 mg) in the evening as needed for headache/pain    [provider]  amLODipine (Nixa)  10 MG tablet Take 1 tablet (10 mg total) by mouth daily. 03/27/21   Shelly Coss, MD  benzonatate (TESSALON) 200 MG capsule Take 1 capsule (200 mg total) by mouth 3 (three) times daily as needed for cough. 03/26/21   Shelly Coss, MD  clopidogrel (PLAVIX) 75 MG tablet Take 1 tablet (75 mg total) by mouth daily. Patient taking differently: Take 75 mg by mouth every morning. 03/21/14   Velna Hatchet, MD  divalproex (DEPAKOTE ER) 500 MG 24 hr tablet TAKE 1  TABLET BY MOUTH EVERY DAY Patient taking differently: 500 mg every morning. 04/19/20   Frann Rider, NP  levothyroxine (SYNTHROID) 100 MCG tablet Take 1 tablet (100 mcg total) by mouth See admin instructions. Take one tablet (50 mcg) by mouth 6 days weekly in the morning, skip Sundays 03/26/21   Shelly Coss, MD  pantoprazole (PROTONIX) 40 MG tablet Take 40 mg by mouth every evening.    [provider]  rosuvastatin (CRESTOR) 10 MG tablet Take 10 mg by mouth at bedtime.    [provider]  sertraline (ZOLOFT) 50 MG tablet Take 1 tablet (50 mg total) by mouth daily. 06/28/20   Frann Rider, NP  vitamin E 180 MG (400 UNITS) capsule Take 400 Units by mouth at bedtime.    [provider]    Physical Exam: Vitals:   12/22/21 1736 12/22/21 1814 12/22/21 2003 12/22/21 2200  BP: 127/83 132/65  123/71  Pulse: 90 90  92  Resp: '14 17  20  '$ Temp:   (!) 97.4 F (36.3 C)   TempSrc:      SpO2: 94% 99%  99%   Constitutional: Thin, frail elderly woman resting in bed.  NAD, calm Eyes: EOMI, lids and conjunctivae normal ENMT: Mucous membranes are dry. Posterior pharynx clear of any exudate or lesions.Normal dentition.  Neck: normal, supple, no masses. Respiratory: clear to auscultation bilaterally, no wheezing, no crackles. Normal respiratory effort. No accessory muscle use.  Cardiovascular: Regular rate and rhythm, no murmurs / rubs / gallops. No extremity edema. 2+ pedal pulses. Abdomen: no tenderness, no masses palpated. Musculoskeletal: Thin extremities.  No clubbing / cyanosis. No joint deformity upper and lower extremities. Normal muscle tone.  Skin: no rashes, lesions, ulcers. No induration Neurologic: Speech is slow but otherwise fluent.  Sensation intact. Strength equal bilaterally. Psychiatric: Awake, alert, and oriented to self, place, situation but not year.  Recognizes her son at bedside.  EKG: Personally reviewed. Sinus rhythm, artifact  throughout.  Assessment/Plan Principal Problem:   Acute metabolic encephalopathy Active Problems:   Urinary tract infection   Hypothyroidism   Hypercholesterolemia   HTN (hypertension)   History of CVA (cerebrovascular accident)   History of seizure-like activity   JACKOLYN GERON is a 88 y.o. female with medical history significant for history of CVA, HTN, HLD, hypothyroidism, seizure-like activity on Depakote who is admitted with acute metabolic encephalopathy due to UTI.  Assessment and Plan: * Acute metabolic encephalopathy UTI Acute metabolic encephalopathy suspect due to UTI and dehydration.  CT head negative.  Mental status improving after receiving antibiotics. -Continue IV ceftriaxone -Add on urine culture -Gentle IV fluid hydration overnight  History of seizure-like activity Continue Depakote.  History of CVA (cerebrovascular accident) Continue Plavix.  HTN (hypertension) Continue amlodipine.  Hypercholesterolemia Continue rosuvastatin.  Hypothyroidism TSH mildly elevated at 7.842 in setting of acute illness.  Free T4 in process.  Resume home Synthroid dose once med rec completed and recommend repeat labs as an outpatient.  DVT  prophylaxis: enoxaparin (LOVENOX) injection 30 mg Start: 12/23/21 2200 Code Status: DNR, confirmed with son on admission Family Communication: Son at bedside Disposition Plan: From ALF, dispo pending clinical progress Consults called: None Severity of Illness: The appropriate patient status for this patient is OBSERVATION. Observation status is judged to be reasonable and necessary in order to provide the required intensity of service to ensure the patient's safety. The patient's presenting symptoms, physical exam findings, and initial radiographic and laboratory data in the context of their medical condition is felt to place them at decreased risk for further clinical deterioration. Furthermore, it is anticipated that the patient will be  medically stable for discharge from the hospital within 2 midnights of admission.   Zada Finders MD [facility]  If 7PM-7AM, please contact night-coverage [email or web address]  12/22/2021, 11:48 PM

## 2021-12-22 NOTE — ED Provider Notes (Signed)
[facility] EMERGENCY DEPARTMENT Provider Note   CSN: [id number] Arrival date & time: 12/22/21  1506     History  Chief Complaint  Patient presents with   Altered Mental Status    Danielle Bryan is a 88 y.o. female.  88 year old female with past medical history significant for CVA, hyperlipidemia, hypothyroidism, hypertension who presents today for evaluation of altered mental status.  Patient unable to provide history due to mental status change.  Most of history obtained by EMS staff, & son.  Son states he visited patient this afternoon and noted that she was confused compared to baseline.  Currently she is alert and oriented to self.  She is unsure of current year, or place.  At baseline she is alert and oriented x4 according to son.  She is able to ambulate with assistance.  Which she was able to do with EMS today.  Son states that while they were outside she complained of having an upset stomach but no vomiting, no recent fever, no chest pain or other complaints.  Patient was her usual self yesterday when son was with her.  The history is provided by the patient. No language interpreter was used.       Home Medications Prior to Admission medications   Medication Sig Start Date End Date Taking? Authorizing Provider  acetaminophen (TYLENOL) 500 MG tablet Take 1,000 mg by mouth See admin instructions. Take 2 tablets (1000 mg) by mouth every morning, may also take 2 tablets (1000 mg) in the evening as needed for headache/pain    [provider]  amLODipine (NORVASC) 10 MG tablet Take 1 tablet (10 mg total) by mouth daily. 03/27/21   Shelly Coss, MD  benzonatate (TESSALON) 200 MG capsule Take 1 capsule (200 mg total) by mouth 3 (three) times daily as needed for cough. 03/26/21   Shelly Coss, MD  clopidogrel (PLAVIX) 75 MG tablet Take 1 tablet (75 mg total) by mouth daily. Patient taking differently: Take 75 mg by mouth every morning. 03/21/14   Velna Hatchet, MD  divalproex (DEPAKOTE ER) 500 MG 24 hr tablet TAKE 1 TABLET BY MOUTH EVERY DAY Patient taking differently: 500 mg every morning. 04/19/20   Frann Rider, NP  levothyroxine (SYNTHROID) 100 MCG tablet Take 1 tablet (100 mcg total) by mouth See admin instructions. Take one tablet (50 mcg) by mouth 6 days weekly in the morning, skip Sundays 03/26/21   Shelly Coss, MD  pantoprazole (PROTONIX) 40 MG tablet Take 40 mg by mouth every evening.    [provider]  rosuvastatin (CRESTOR) 10 MG tablet Take 10 mg by mouth at bedtime.    [provider]  sertraline (ZOLOFT) 50 MG tablet Take 1 tablet (50 mg total) by mouth daily. 06/28/20   Frann Rider, NP  vitamin E 180 MG (400 UNITS) capsule Take 400 Units by mouth at bedtime.    [provider]      Allergies    Escitalopram oxalate, Celexa [citalopram], Escitalopram, Lisinopril, Statins, and Sulfa antibiotics    Review of Systems   Review of Systems  Unable to perform ROS: Mental status change    Physical Exam Updated Vital Signs BP (!) 142/87 (BP Location: Right Arm)   Pulse (!) 106   Temp (!) 97.3 F (36.3 C) (Oral)   Resp 20   SpO2 99%  Physical Exam Vitals and nursing note reviewed.  Constitutional:      General: She is not in acute distress.  Appearance: Normal appearance. She is ill-appearing (chronically).  HENT:     Head: Normocephalic and atraumatic.     Nose: Nose normal.  Eyes:     General: No scleral icterus.    Extraocular Movements: Extraocular movements intact.     Conjunctiva/sclera: Conjunctivae normal.  Cardiovascular:     Rate and Rhythm: Regular rhythm. Tachycardia present.     Pulses: Normal pulses.  Pulmonary:     Effort: Pulmonary effort is normal. No respiratory distress.     Breath sounds: Normal breath sounds. No wheezing.  Abdominal:     General: There is no distension.     Palpations: Abdomen is soft.     Tenderness: There is no abdominal tenderness. There is  no guarding.  Musculoskeletal:        General: Normal range of motion.     Cervical back: Normal range of motion.     Right lower leg: No edema.     Left lower leg: No edema.  Skin:    General: Skin is warm and dry.  Neurological:     Mental Status: She is alert.     ED Results / Procedures / Treatments   Labs (all labs ordered are listed, but only abnormal results are displayed) Labs Reviewed - No data to display  EKG None  Radiology No results found.  Procedures Procedures    Medications Ordered in ED Medications - No data to display  ED Course/ Medical Decision Making/ A&P                           Medical Decision Making Amount and/or Complexity of Data Reviewed Labs: ordered. Radiology: ordered.  Risk Decision regarding hospitalization.   Medical Decision Making / ED Course   This patient presents to the ED for concern of altered mental status, this involves an extensive number of treatment options, and is a complaint that carries with it a high risk of complications and morbidity.  The differential diagnosis includes pneumonia, UTI, metabolic encephalopathy, CVA  MDM: 88 year old female with past medical history as noted above presents today for evaluation of altered mental status.  Patient was noted to be at her baseline by her son yesterday.  When he visited today she was altered.  Neurological exam without focal deficits.  She is alert and oriented to self only.  Will evaluate with blood work, chest x-ray, UA, CT head. CBC without leukocytosis or anemia.  CMP with preserved renal function, normal electrolytes.  Troponin within normal limits.  COVID, flu, RSV negative.  UA significant for UTI.  TSH elevated.  Free T4 ordered.  CT head without acute intracranial finding.  Chest x-ray without evidence of pneumonia.  EKG without acute ischemic changes.  Discussed with hospitalist who will evaluate patient for admission given altered mental status in setting of  UTI.  Plan discussed with son who is in agreement.  Dose of Rocephin given in the emergency room.    Lab Tests: -I ordered, reviewed, and interpreted labs.   The pertinent results include:   Labs Reviewed  URINALYSIS, ROUTINE W REFLEX MICROSCOPIC - Abnormal; Notable for the following components:      Result Value   Color, Urine AMBER (*)    APPearance CLOUDY (*)    Ketones, ur 5 (*)    Protein, ur 100 (*)    Leukocytes,Ua MODERATE (*)    WBC, UA >50 (*)    Bacteria, UA FEW (*)  All other components within normal limits  TSH - Abnormal; Notable for the following components:   TSH 7.842 (*)    All other components within normal limits  COMPREHENSIVE METABOLIC PANEL - Abnormal; Notable for the following components:   Glucose, Bld 114 (*)    Total Protein 6.2 (*)    All other components within normal limits  T4, FREE - Abnormal; Notable for the following components:   Free T4 1.40 (*)    All other components within normal limits  RESP PANEL BY RT-PCR (RSV, FLU A&B, COVID)  RVPGX2  URINE CULTURE  CBC WITH DIFFERENTIAL/PLATELET  COMPREHENSIVE METABOLIC PANEL  MAGNESIUM  CBC  BASIC METABOLIC PANEL  TROPONIN I (HIGH SENSITIVITY)  TROPONIN I (HIGH SENSITIVITY)      EKG  EKG Interpretation  Date/Time:  Sunday December 22 2021 15:31:47 EDT Ventricular Rate:  107 PR Interval:    QRS Duration: 91 QT Interval:  332 QTC Calculation: 441 R Axis:   117 Text Interpretation: sinus rhythm with artifact Anterior infarct, old Artifact in lead(s) I II III aVR aVL aVF V1 V2 V6 Interpretation limited secondary to artifact Confirmed by Sherwood Gambler [phone]) on 12/22/2021 3:33:37 PM         Imaging Studies ordered: I ordered imaging studies including CXR I independently visualized and interpreted imaging. I agree with the radiologist interpretation   Medicines ordered and prescription drug management: Meds ordered this encounter  Medications   cefTRIAXone (ROCEPHIN) 1 g in  sodium chloride 0.9 % 100 mL IVPB    Order Specific Question:   Antibiotic Indication:    Answer:   UTI   enoxaparin (LOVENOX) injection 30 mg   OR Linked Order Group    acetaminophen (TYLENOL) tablet 650 mg    acetaminophen (TYLENOL) suppository 650 mg   lactated ringers infusion   OR Linked Order Group    ondansetron (ZOFRAN) tablet 4 mg    ondansetron (ZOFRAN) injection 4 mg   senna-docusate (Senokot-S) tablet 1 tablet   cefTRIAXone (ROCEPHIN) 1 g in sodium chloride 0.9 % 100 mL IVPB    Order Specific Question:   Antibiotic Indication:    Answer:   UTI    -I have reviewed the patients home medicines and have made adjustments as needed  Reevaluation: After the interventions noted above, I reevaluated the patient and found that they have :stayed the same  Co morbidities that complicate the patient evaluation  Past Medical History:  Diagnosis Date   Hypercholesterolemia    Hypertension    Hypothyroidism    Osteoarthritis    Reactive depression (situational)    Stroke ([place])    TIA (transient ischemic attack)    on plavix      Dispostion: Patient discussed with hospitalist will evaluate patient for admission.    Final Clinical Impression(s) / ED Diagnoses Final diagnoses:  Altered mental status, unspecified altered mental status type  Acute cystitis without hematuria  Elevated TSH    Rx / DC Orders ED Discharge Orders     None         Evlyn Courier, PA-C 12/22/21 1945    Sherwood Gambler, MD 12/23/21 [phone]

## 2021-12-22 NOTE — Assessment & Plan Note (Addendum)
TSH mildly elevated at 7.842 in setting of acute illness.  Free T4 in process.  Resume home Synthroid dose once med rec completed and recommend repeat labs as an outpatient.

## 2021-12-22 NOTE — Hospital Course (Signed)
Danielle Bryan is a 88 y.o. female with medical history significant for history of CVA, HTN, HLD, hypothyroidism, seizure-like activity on Depakote who is admitted with acute metabolic encephalopathy due to UTI.

## 2021-12-22 NOTE — Assessment & Plan Note (Signed)
Continue rosuvastatin.  

## 2021-12-22 NOTE — ED Triage Notes (Signed)
Pt BIBA from [place] from [facility]. Pt is reportedly on hospice, however hospice has NO paperwork, no records, no DNR. Pt is not acting like herself per facility- no fever nor hypotension. A&O x 2- person, place.  CBG 149

## 2021-12-22 NOTE — Assessment & Plan Note (Signed)
Continue Plavix.  ?

## 2021-12-22 NOTE — ED Notes (Signed)
ED TO INPATIENT HANDOFF REPORT  ED Nurse Name and Phone #: Philip Aspen Name/Age/Gender Danielle Bryan 88 y.o. female Room/Bed: H021C/H021C  Code Status   Code Status: DNR  Home/SNF/Other Nursing Home Patient oriented to: self Is this baseline? Yes   Triage Complete: Triage complete  Chief Complaint Acute metabolic encephalopathy [C16.38]  Triage Note Pt BIBA from [place] from [facility]. Pt is reportedly on hospice, however hospice has NO paperwork, no records, no DNR. Pt is not acting like herself per facility- no fever nor hypotension. A&O x 2- person, place.  CBG 149   Allergies Allergies  Allergen Reactions   Escitalopram Oxalate Nausea Only   Celexa [Citalopram] Other (See Comments)    Unknown   Escitalopram Other (See Comments)    unknown   Lisinopril Other (See Comments)    Very decreased blood pressure    Statins Other (See Comments)    myalgias   Sulfa Antibiotics Hives and Other (See Comments)    Level of Care/Admitting Diagnosis ED Disposition     ED Disposition  Admit   Condition  --   [place]: [facility] [[id number]]  Level of Care: Med-Surg [16]  May place patient in observation at [facility] or [facility] [place] if equivalent level of care is available:: No  Covid Evaluation: Confirmed COVID Negative  Diagnosis: Acute metabolic encephalopathy [4536468]  Admitting Physician: Lenore Cordia [[id number]]  Attending Physician: Lenore Cordia [[id number]]          B Medical/Surgery History Past Medical History:  Diagnosis Date   Hypercholesterolemia    Hypertension    Hypothyroidism    Osteoarthritis    Reactive depression (situational)    Stroke ([place])    TIA (transient ischemic attack)    on plavix   Past Surgical History:  Procedure Laterality Date   CATARACT EXTRACTION Left 1992   CATARACT EXTRACTION Right 1995   [place]       A IV Location/Drains/Wounds Patient  Lines/Drains/Airways Status     Active Line/Drains/Airways     Name Placement date Placement time Site Days   Peripheral IV 12/22/21 20 G Anterior;Left Forearm 12/22/21  1500  Forearm  less than 1   Pressure Injury 01/13/21 Heel Left;Right Stage 1 -  Intact skin with non-blanchable redness of a localized area usually over a bony prominence. red, non blanchable 01/13/21  1508  -- 343            Intake/Output Last 24 hours  Intake/Output Summary (Last 24 hours) at 12/22/2021 2212 Last data filed at 12/22/2021 1921 Gross per 24 hour  Intake 100 ml  Output --  Net 100 ml    Labs/Imaging Results for orders placed or performed during the hospital encounter of 12/22/21 (from the past 48 hour(s))  Urinalysis, Routine w reflex microscopic Urine, In & Out Cath     Status: Abnormal   Collection Time: 12/22/21  3:27 PM  Result Value Ref Range   Color, Urine AMBER (A) YELLOW    Comment: BIOCHEMICALS MAY BE AFFECTED BY COLOR   APPearance CLOUDY (A) CLEAR   Specific Gravity, Urine 1.027 1.005 - 1.030   pH 5.0 5.0 - 8.0   Glucose, UA NEGATIVE NEGATIVE mg/dL   Hgb urine dipstick NEGATIVE NEGATIVE   Bilirubin Urine NEGATIVE NEGATIVE   Ketones, ur 5 (A) NEGATIVE mg/dL   Protein, ur 100 (A) NEGATIVE mg/dL   Nitrite NEGATIVE NEGATIVE   Leukocytes,Ua MODERATE (A) NEGATIVE  RBC / HPF 11-20 0 - 5 RBC/hpf   WBC, UA >50 (H) 0 - 5 WBC/hpf   Bacteria, UA FEW (A) NONE SEEN   WBC Clumps PRESENT    Mucus PRESENT    Hyaline Casts, UA PRESENT     Comment: Performed at [facility] [address] [place], [place] [address]  Resp panel by RT-PCR (RSV, Flu A&B, Covid) Anterior Nasal Swab     Status: None   Collection Time: 12/22/21  3:31 PM   Specimen: Anterior Nasal Swab  Result Value Ref Range   SARS Coronavirus 2 by RT PCR NEGATIVE NEGATIVE    Comment: (NOTE) SARS-CoV-2 target nucleic acids are NOT DETECTED.  The SARS-CoV-2 RNA is generally detectable in upper respiratory specimens  during the acute phase of infection. The lowest concentration of SARS-CoV-2 viral copies this assay can detect is 138 copies/mL. A negative result does not preclude SARS-Cov-2 infection and should not be used as the sole basis for treatment or other patient management decisions. A negative result may occur with  improper specimen collection/handling, submission of specimen other than nasopharyngeal swab, presence of viral mutation(s) within the areas targeted by this assay, and inadequate number of viral copies(<138 copies/mL). A negative result must be combined with clinical observations, patient history, and epidemiological information. The expected result is Negative.  Fact Sheet for Patients:  EntrepreneurPulse.com.au  Fact Sheet for Healthcare Providers:  IncredibleEmployment.be  This test is no t yet approved or cleared by the [place] FDA and  has been authorized for detection and/or diagnosis of SARS-CoV-2 by FDA under an Emergency Use Authorization (EUA). This EUA will remain  in effect (meaning this test can be used) for the duration of the COVID-19 declaration under Section 564(b)(1) of the Act, 21 U.S.C.section 360bbb-3(b)(1), unless the authorization is terminated  or revoked sooner.       Influenza A by PCR NEGATIVE NEGATIVE   Influenza B by PCR NEGATIVE NEGATIVE    Comment: (NOTE) The Xpert Xpress SARS-CoV-2/FLU/RSV plus assay is intended as an aid in the diagnosis of influenza from Nasopharyngeal swab specimens and should not be used as a sole basis for treatment. Nasal washings and aspirates are unacceptable for Xpert Xpress SARS-CoV-2/FLU/RSV testing.  Fact Sheet for Patients: EntrepreneurPulse.com.au  Fact Sheet for Healthcare Providers: IncredibleEmployment.be  This test is not yet approved or cleared by the [place] FDA and has been authorized for detection and/or diagnosis  of SARS-CoV-2 by FDA under an Emergency Use Authorization (EUA). This EUA will remain in effect (meaning this test can be used) for the duration of the COVID-19 declaration under Section 564(b)(1) of the Act, 21 U.S.C. section 360bbb-3(b)(1), unless the authorization is terminated or revoked.     Resp Syncytial Virus by PCR NEGATIVE NEGATIVE    Comment: (NOTE) Fact Sheet for Patients: EntrepreneurPulse.com.au  Fact Sheet for Healthcare Providers: IncredibleEmployment.be  This test is not yet approved or cleared by the [place] FDA and has been authorized for detection and/or diagnosis of SARS-CoV-2 by FDA under an Emergency Use Authorization (EUA). This EUA will remain in effect (meaning this test can be used) for the duration of the COVID-19 declaration under Section 564(b)(1) of the Act, 21 U.S.C. section 360bbb-3(b)(1), unless the authorization is terminated or revoked.  Performed at [facility], [place] [address], [place], [place] [address]   CBC with Differential     Status: None   Collection Time: 12/22/21  4:06 PM  Result Value Ref Range  WBC 6.1 4.0 - 10.5 K/uL   RBC 4.53 3.87 - 5.11 MIL/uL   Hemoglobin 13.7 12.0 - 15.0 g/dL   HCT 41.6 36.0 - 46.0 %   MCV 91.8 80.0 - 100.0 fL   MCH 30.2 26.0 - 34.0 pg   MCHC 32.9 30.0 - 36.0 g/dL   RDW 14.2 11.5 - 15.5 %   Platelets 151 150 - 400 K/uL   nRBC 0.0 0.0 - 0.2 %   Neutrophils Relative % 66 %   Neutro Abs 4.0 1.7 - 7.7 K/uL   Lymphocytes Relative 23 %   Lymphs Abs 1.4 0.7 - 4.0 K/uL   Monocytes Relative 10 %   Monocytes Absolute 0.6 0.1 - 1.0 K/uL   Eosinophils Relative 0 %   Eosinophils Absolute 0.0 0.0 - 0.5 K/uL   Basophils Relative 0 %   Basophils Absolute 0.0 0.0 - 0.1 K/uL   Immature Granulocytes 1 %   Abs Immature Granulocytes 0.03 0.00 - 0.07 K/uL    Comment: Performed at [facility] [address] [place], [place] [address]  Comprehensive metabolic  panel     Status: None   Collection Time: 12/22/21  4:06 PM  Result Value Ref Range   Sodium HEMOLYZED SPECIMEN - SUGGEST RECOLLECT 135 - 145 mmol/L   Potassium HEMOLYZED SPECIMEN - SUGGEST RECOLLECT 3.5 - 5.1 mmol/L   Chloride HEMOLYZED SPECIMEN - SUGGEST RECOLLECT 98 - 111 mmol/L   CO2 HEMOLYZED SPECIMEN - SUGGEST RECOLLECT 22 - 32 mmol/L   Glucose, Bld HEMOLYZED SPECIMEN - SUGGEST RECOLLECT 70 - 99 mg/dL    Comment: Glucose reference range applies only to samples taken after fasting for at least 8 hours.   BUN HEMOLYZED SPECIMEN - SUGGEST RECOLLECT 8 - 23 mg/dL   Creatinine, Ser HEMOLYZED SPECIMEN - SUGGEST RECOLLECT 0.44 - 1.00 mg/dL   Calcium HEMOLYZED SPECIMEN - SUGGEST RECOLLECT 8.9 - 10.3 mg/dL   Total Protein HEMOLYZED SPECIMEN - SUGGEST RECOLLECT 6.5 - 8.1 g/dL   Albumin HEMOLYZED SPECIMEN - SUGGEST RECOLLECT 3.5 - 5.0 g/dL   AST HEMOLYZED SPECIMEN - SUGGEST RECOLLECT 15 - 41 U/L   ALT HEMOLYZED SPECIMEN - SUGGEST RECOLLECT 0 - 44 U/L   Alkaline Phosphatase HEMOLYZED SPECIMEN - SUGGEST RECOLLECT 38 - 126 U/L   Total Bilirubin HEMOLYZED SPECIMEN - SUGGEST RECOLLECT 0.3 - 1.2 mg/dL   GFR, Estimated HEMOLYZED SPECIMEN - SUGGEST RECOLLECT >60 mL/min   Anion gap HEMOLYZED SPECIMEN - SUGGEST RECOLLECT 5 - 15    Comment: Performed at [facility] [address], [place], [place] [address]  Troponin I (High Sensitivity)     Status: None   Collection Time: 12/22/21  4:06 PM  Result Value Ref Range   Troponin I (High Sensitivity) 8 <18 ng/L    Comment: (NOTE) Elevated high sensitivity troponin I (hsTnI) values and significant  changes across serial measurements may suggest ACS but many other  chronic and acute conditions are known to elevate hsTnI results.  Refer to the Links section for chest pain algorithms and additional  guidance. Performed at [facility], [place] [address] [place], [place] [address]   TSH     Status: Abnormal   Collection Time: 12/22/21  4:06 PM   Result Value Ref Range   TSH 7.842 (H) 0.350 - 4.500 uIU/mL    Comment: Performed by a 3rd Generation assay with a functional sensitivity of <=0.01 uIU/mL. Performed at [facility], [place] [address], [place], [place]  [address]   T4, free     Status: Abnormal   Collection Time: 12/22/21  4:06 PM  Result Value Ref Range   Free T4 1.40 (H) 0.61 - 1.12 ng/dL    Comment: (NOTE) Biotin ingestion may interfere with free T4 tests. If the results are inconsistent with the TSH level, previous test results, or the clinical presentation, then consider biotin interference. If needed, order repeat testing after stopping biotin. Performed at [facility], [place] [address] [place], [place] [address]   Troponin I (High Sensitivity)     Status: None   Collection Time: 12/22/21  5:38 PM  Result Value Ref Range   Troponin I (High Sensitivity) 8 <18 ng/L    Comment: (NOTE) Elevated high sensitivity troponin I (hsTnI) values and significant  changes across serial measurements may suggest ACS but many other  chronic and acute conditions are known to elevate hsTnI results.  Refer to the "Links" section for chest pain algorithms and additional  guidance. Performed at [facility], [place] [address] [place], [place] [address]   Comprehensive metabolic panel     Status: Abnormal   Collection Time: 12/22/21  5:38 PM  Result Value Ref Range   Sodium 136 135 - 145 mmol/L   Potassium 3.7 3.5 - 5.1 mmol/L   Chloride 98 98 - 111 mmol/L   CO2 27 22 - 32 mmol/L   Glucose, Bld 114 (H) 70 - 99 mg/dL    Comment: Glucose reference range applies only to samples taken after fasting for at least 8 hours.   BUN 10 8 - 23 mg/dL   Creatinine, Ser 0.61 0.44 - 1.00 mg/dL   Calcium 9.4 8.9 - 10.3 mg/dL   Total Protein 6.2 (L) 6.5 - 8.1 g/dL   Albumin 3.5 3.5 - 5.0 g/dL   AST 16 15 - 41 U/L   ALT 6 0 - 44 U/L   Alkaline Phosphatase 44 38 - 126 U/L   Total Bilirubin 0.5 0.3 - 1.2 mg/dL   GFR, Estimated  >60 >60 mL/min    Comment: (NOTE) Calculated using the CKD-EPI Creatinine Equation (2021)    Anion gap 11 5 - 15    Comment: Performed at [facility] [address], [place], [place] [address]  Magnesium     Status: None   Collection Time: 12/22/21  5:38 PM  Result Value Ref Range   Magnesium 1.8 1.7 - 2.4 mg/dL    Comment: Performed at [facility], [place] [address], [place], [place] [address]   CT Head Wo Contrast  Result Date: 12/22/2021 CLINICAL DATA:  Mental status change of unknown cause. EXAM: CT HEAD WITHOUT CONTRAST TECHNIQUE: Contiguous axial images were obtained from the base of the skull through the vertex without intravenous contrast. RADIATION DOSE REDUCTION: This exam was performed according to the departmental dose-optimization program which includes automated exposure control, adjustment of the mA and/or kV according to patient size and/or use of iterative reconstruction technique. COMPARISON:  03/22/2021. FINDINGS: Brain: No evidence of acute infarction, hemorrhage, hydrocephalus, extra-axial collection or mass lesion/mass effect. There is ventricular and sulcal enlargement reflecting age related volume loss. Patchy bilateral white matter hypoattenuation is present consistent with moderate chronic microvascular ischemic change. These findings are stable. Vascular: No hyperdense vessel or unexpected calcification. Skull: Normal. Negative for fracture or focal lesion. Sinuses/Orbits: Globes and orbits are unremarkable. The visualized sinuses are clear. Other: None. IMPRESSION: 1. No acute intracranial abnormalities. Stable appearance from the  prior study. Electronically Signed   By: Lajean Manes M.D.   On: 12/22/2021 17:42   DG Chest Portable 1 View  Result Date: 12/22/2021 CLINICAL DATA:  Altered mental status EXAM: PORTABLE CHEST 1 VIEW COMPARISON:  March 22, 2021 chest x-ray FINDINGS: The heart, hila, mediastinum, lungs, and pleura are unchanged with no acute  abnormality. IMPRESSION: No active disease. Electronically Signed   By: Dorise Bullion III M.D.   On: 12/22/2021 15:50    Pending Labs Unresulted Labs (From admission, onward)     Start     Ordered   12/23/21 0500  CBC  Tomorrow morning,   R        12/22/21 1925   12/23/21 4239  Basic metabolic panel  Tomorrow morning,   R        12/22/21 1925   12/22/21 1848  Urine Culture  (Urine Culture)  Add-on,   AD       Question:  Indication  Answer:  Altered mental status (if no other cause identified)   12/22/21 1847            Vitals/Pain Today's Vitals   12/22/21 1630 12/22/21 1736 12/22/21 1814 12/22/21 2003  BP: 139/73 127/83 132/65   Pulse: 94 90 90   Resp: '19 14 17   '$ Temp:    (!) 97.4 F (36.3 C)  TempSrc:      SpO2: 100% 94% 99%     Isolation Precautions No active isolations  Medications Medications  enoxaparin (LOVENOX) injection 30 mg (has no administration in time range)  acetaminophen (TYLENOL) tablet 650 mg (has no administration in time range)    Or  acetaminophen (TYLENOL) suppository 650 mg (has no administration in time range)  lactated ringers infusion ( Intravenous New Bag/Given 12/22/21 2030)  ondansetron (ZOFRAN) tablet 4 mg (has no administration in time range)    Or  ondansetron (ZOFRAN) injection 4 mg (has no administration in time range)  senna-docusate (Senokot-S) tablet 1 tablet (has no administration in time range)  cefTRIAXone (ROCEPHIN) 1 g in sodium chloride 0.9 % 100 mL IVPB (has no administration in time range)  cefTRIAXone (ROCEPHIN) 1 g in sodium chloride 0.9 % 100 mL IVPB (0 g Intravenous Stopped 12/22/21 1921)    Mobility non-ambulatory High fall risk   Focused Assessments Neuro Assessment Handoff:           Neuro Assessment:   Neuro Checks:      Last Documented NIHSS Modified Score:   Has TPA been given? No If patient is a Neuro Trauma and patient is going to OR before floor call report to [place] nurse: [phone]  or [phone]   R Recommendations: See Admitting Provider Note  Report given to:   Additional Notes:

## 2021-12-23 DIAGNOSIS — G9341 Metabolic encephalopathy: Secondary | ICD-10-CM | POA: Diagnosis not present

## 2021-12-23 LAB — CBC
HCT: 32.1 % — ABNORMAL LOW (ref 36.0–46.0)
Hemoglobin: 10.3 g/dL — ABNORMAL LOW (ref 12.0–15.0)
MCH: 29.6 pg (ref 26.0–34.0)
MCHC: 32.1 g/dL (ref 30.0–36.0)
MCV: 92.2 fL (ref 80.0–100.0)
Platelets: 129 10*3/uL — ABNORMAL LOW (ref 150–400)
RBC: 3.48 MIL/uL — ABNORMAL LOW (ref 3.87–5.11)
RDW: 14.2 % (ref 11.5–15.5)
WBC: 5.2 10*3/uL (ref 4.0–10.5)
nRBC: 0 % (ref 0.0–0.2)

## 2021-12-23 LAB — BASIC METABOLIC PANEL
Anion gap: 8 (ref 5–15)
BUN: 7 mg/dL — ABNORMAL LOW (ref 8–23)
CO2: 27 mmol/L (ref 22–32)
Calcium: 9 mg/dL (ref 8.9–10.3)
Chloride: 102 mmol/L (ref 98–111)
Creatinine, Ser: 0.42 mg/dL — ABNORMAL LOW (ref 0.44–1.00)
GFR, Estimated: >60 mL/min (ref >60)
Glucose, Bld: 91 mg/dL (ref 70–99)
Potassium: 3.5 mmol/L (ref 3.5–5.1)
Sodium: 137 mmol/L (ref 135–145)

## 2021-12-23 NOTE — TOC Initial Note (Signed)
Transition of Care [facility]) - Initial/Assessment Note    Patient Details  Name: Danielle Bryan MRN: [id number] Date of Birth: 1923-07-14  [facility] CM/SW Contact:    Curlene Labrum, RN Phone Number: 12/23/2021, 3:22 PM  Clinical Narrative:                 CM met with the patient at the bedside - no family are present in the room at this time.  I called Dara Hoyer, son and was unable to leave a voicemail message since the voicemail was full.  I called and spoke with the patient's other son, Tyaisha Cullom by phone and updated that the patient would return for care at [facility] tomorrow.  PT/OT home health orders were placed and attending physician is aware. The ALF requested PT/OT eval and I asked for placement of order.  Nanine Means will receive home health orders and discharge summary when she is discharged home tomorrow by [facility].  The patient's son, Berdene Askari was updated regarding plans and information was provided that the patient was under Medicare Observation and documentation will be left with the patient at the bedside.  The patient is wheelchair bound and the ALF requested that the patient return home by [facility] tomorrow - no wheelchair is present in the room.  DNR is located on the chart and will be sent back with the patient to the facility.  Expected Discharge Plan: [place] Barriers to Discharge: Continued Medical Work up   Patient Goals and CMS Choice Patient states their goals for this hospitalization and ongoing recovery are:: To return to [facility] in [place], [place] CMS Medicare.gov Compare Post Acute Care list provided to:: Patient Choice offered to / list presented to : Adult Children  Expected Discharge Plan and Services Expected Discharge Plan: [place]   Discharge Planning Services: CM Consult Post Acute Care Choice: [place] arrangements for the past 2 months: Assisted Living Facility                            HH Arranged: PT, OT [place] Agency:  Nanine Means ALF to order home health provider) Date HH Agency Contacted: 12/23/21 Time [place]: 1520 Representative spoke with at [place]: spoke with Genella Mech, [place] at [place] and she will fax home health orders to their provider  Prior Living Arrangements/Services Living arrangements for the past 2 months: [place] Lives with:: Facility Resident Patient language and need for interpreter reviewed:: Yes Do you feel safe going back to the place where you live?: Yes      Need for Family Participation in Patient Care: Yes (Comment) Care giver support system in place?: Yes (comment) Current home services: DME (WC at the ALF) Criminal Activity/Legal Involvement Pertinent to Current Situation/Hospitalization: No - Comment as needed  Activities of Daily Living   ADL Screening (condition at time of admission) Patient's cognitive ability adequate to safely complete daily activities?: No Is the patient deaf or have difficulty hearing?: Yes Does the patient have difficulty concentrating, remembering, or making decisions?: Yes Patient able to express need for assistance with ADLs?: No Does the patient have difficulty dressing or bathing?: Yes Independently performs ADLs?: No Walks in Home: Independent Does the patient have difficulty walking or climbing stairs?: Yes Weakness of Legs: Both Weakness of Arms/Hands: Both  Permission Sought/Granted Permission sought to share information with : Case Manager,  Family Supports, Chartered certified accountant granted to share information with : Yes, Verbal Permission Granted     Permission granted to share info w AGENCY: [facility] in [place], [place]  Permission granted to share info w Relationship: son, Timara Loma - [phone]     Emotional Assessment Appearance:: Appears stated age Attitude/Demeanor/Rapport: Inconsistent,  Engaged Affect (typically observed): Pleasant Orientation: : Oriented to Self Alcohol / Substance Use: Not Applicable Psych Involvement: No (comment)  Admission diagnosis:  Elevated TSH [R79.89] Acute cystitis without hematuria [N30.00] Altered mental status, unspecified altered mental status type [K32.76] Acute metabolic encephalopathy [D47.09] Patient Active Problem List   Diagnosis Date Noted   History of CVA (cerebrovascular accident) 12/22/2021   History of seizure-like activity 12/22/2021   Severe sepsis ([place]) 03/22/2021   Chronic diastolic CHF (congestive heart failure) ([place]) 03/22/2021   GERD (gastroesophageal reflux disease) 03/22/2021   Depression 03/22/2021   Pressure injury of skin 01/13/2021   Fall 01/12/2021   Nasal bone fractures 01/12/2021   Pain due to onychomycosis of toenails of both feet 05/04/2019   Coagulation defect ([place]) 05/04/2019   Vertigo 01/27/2019   CVA (cerebral vascular accident) ([place]) 01/27/2019   Aphasia 03/20/2016   HTN (hypertension) 03/20/2016   Weakness 03/18/2015   Dehydration 03/18/2015   Nausea and vomiting 03/18/2015   Thrombocytopenia ([place]) 03/18/2015   Moderate nausea and vomiting 03/18/2015   Elevated lactic acid level 03/18/2015   Elevated troponin 29/57/4734   Acute metabolic encephalopathy 03/70/9643   Urinary tract infection 08/22/2014   TIA (transient ischemic attack) 03/19/2014   Benign hypertensive heart disease without heart failure 08/20/2010   Hypothyroidism    Osteoarthritis    Hypercholesterolemia    Reactive depression (situational)    PCP:  Velna Hatchet, MD Pharmacy:   [id number] [place]Phone: [phone]: [phone] [facility]) [place] [address]Phone: [phone]:  [phone] [facility] [place]Phone: [phone]: [phone]    Social Determinants of Health (SDOH) Interventions    Readmission Risk Interventions     No data to display

## 2021-12-23 NOTE — Progress Notes (Addendum)
Received patient this AM at 0700 from [place]. Hassan Rowan states she could not complete assessment due to patients lethargy. Patient lethargic this AM. Patient opens her eyes briefly. Patient AM medications held. Unable to wake for breakfast this am. Mouth care provided.

## 2021-12-23 NOTE — Care Management Obs Status (Signed)
[facility] NOTIFICATION   Patient Details  Name: Danielle Bryan MRN: [id number] Date of Birth: 10-Mar-1923   Medicare Observation Status Notification Given:  Yes    Curlene Labrum, RN 12/23/2021, 2:59 PM

## 2021-12-23 NOTE — Progress Notes (Signed)
PROGRESS NOTE  MAUI AHART  DOB: 09-03-1923  PCP: Velna Hatchet, MD ZSW:[id number]  DOA: 12/22/2021  LOS: 0 days  Hospital Day: 2  Brief narrative: Danielle Bryan is a 88 y.o. female with PMH significant for HTN, HLD, CVA on Plavix, hypothyroidism, osteoarthritis, depression, seizure-like activity on Depakote who is currently under hospice care at [facility] assisted living at [facility] 10/29, patient was sent to the ED for evaluation of altered mental status.  At baseline, patient is alert, awake, oriented x3, requires a wheelchair.  Per son, patient was at her usual baseline day prior to admission (10/28).  On 10/21, he noted that she was less interactive and clearly confused compared to her baseline.  She was complaining of nausea. EMS were called and she was brought to the ED for further evaluation.  In the ED, patient was hemodynamically stable Labs mostly unremarkable Flu, COVID PCR negative Urinalysis showed cloudy amber color urine, negative nitrates, moderate leukocytes Troponin negative x2 Portable chest x-ray negative for focal consolidation, edema, effusion. CT head without contrast negative for acute intracranial normalities. Given IV Rocephin. Admitted to [facility].  Subjective: Patient was seen and examined this afternoon. Propped up in bed.  Being fed by nursing staff.  Alert, awake.  Demented at baseline.  Slow to respond.  Able to tell me her date of birth.  Unable to answer other questions.  No family at bedside. Chart reviewed.   This morning, patient was hard to wake up by the nursing staff.  Much more awake at the time of my evaluation this afternoon.   No fever, remains hemodynamically stable, breathing on room air Labs with WBC count normal at 5.2, hemoglobin of 10.3, platelet low at 129.  Assessment and plan: UTI Brought in for altered mentation. Urinalysis showed moderate leukocytes.  Suspected UTI.  Currently on IV Rocephin Pending urine culture WBC count  normal. Recent Labs  Lab 12/22/21 1606 12/23/21 0422  WBC 6.1 5.2   Acute metabolic encephalopathy Suspected to be secondary to UTI.  CT head negative. Continue to monitor mental status change PTA on Depakote 500 mg daily, sertraline 50 mg daily.  Continue the same   History of seizure-like activity Continue Depakote.   History of CVA (cerebrovascular accident) HLD Continue Plavix,, Crestor   HTN  Continue amlodipine   Hypercholesterolemia Continue rosuvastatin.   Hypothyroidism TSH mildly elevated at 7.842 in setting of acute illness.  Free T4 likely up at 1.4. Continue Synthroid.  Goals of care   Code Status: DNR    Mobility: Unclear baseline mobility.  Skin assessment:     Nutritional status:  There is no height or weight on file to calculate BMI.          Diet:  Diet Order             Diet regular Room service appropriate? Yes; Fluid consistency: Thin  Diet effective now                   DVT prophylaxis:  enoxaparin (LOVENOX) injection 30 mg Start: 12/23/21 2200   Antimicrobials: IV Rocephin Fluid: None Consultants: None Family Communication: Called and updated patient's son [name] this afternoon  Status is: Observation  Continue in-hospital care because: Improving on IV antibiotics, pending urine culture report Level of care: Med-Surg   Dispo: The patient is from: ALF              Anticipated d/c is to: Plan to discharge back to ALF tomorrow  Patient currently is not medically stable to d/c.   Difficult to place patient No     Infusions:   cefTRIAXone (ROCEPHIN)  IV 1 g (12/23/21 0948)    Scheduled Meds:  amLODipine  10 mg Oral Daily   clopidogrel  75 mg Oral q morning   divalproex  500 mg Oral q morning   enoxaparin (LOVENOX) injection  30 mg Subcutaneous Q24H   rosuvastatin  10 mg Oral QHS   sertraline  50 mg Oral Daily    PRN meds: acetaminophen **OR** acetaminophen, ondansetron **OR** ondansetron  (ZOFRAN) IV, senna-docusate   Antimicrobials: Anti-infectives (From admission, onward)    Start     Dose/Rate Route Frequency Ordered Stop   12/23/21 1000  cefTRIAXone (ROCEPHIN) 1 g in sodium chloride 0.9 % 100 mL IVPB        1 g 200 mL/hr over 30 Minutes Intravenous Every 24 hours 12/22/21 1928     12/22/21 1815  cefTRIAXone (ROCEPHIN) 1 g in sodium chloride 0.9 % 100 mL IVPB        1 g 200 mL/hr over 30 Minutes Intravenous  Once 12/22/21 1803 12/22/21 1921       Objective: Vitals:   12/23/21 0740 12/23/21 1151  BP: (!) 143/77 138/72  Pulse: 84 80  Resp: 18 18  Temp: 98.4 F (36.9 C) 98.6 F (37 C)  SpO2: 97% 97%    Intake/Output Summary (Last 24 hours) at 12/23/2021 1444 Last data filed at 12/23/2021 1434 Gross per 24 hour  Intake 532.5 ml  Output 700 ml  Net -167.5 ml   There were no vitals filed for this visit. Weight change:  There is no height or weight on file to calculate BMI.   Physical Exam: General exam: Pleasant elderly Caucasian female.  Not in physical distress Skin: No rashes, lesions or ulcers. HEENT: Atraumatic, normocephalic, no obvious bleeding Lungs: Clear to auscultation bilaterally CVS: Regular rate and rhythm, no murmur GI/Abd soft, nontender, nondistended, bowel sound present CNS: Alert, awake, due to self only Psychiatry: Mood appropriate Extremities: No pedal edema, no calf tenderness  Data Review: I have personally reviewed the laboratory data and studies available.  F/u labs ordered Unresulted Labs (From admission, onward)     Start     Ordered   12/22/21 1848  Urine Culture  (Urine Culture)  Add-on,   AD       Question:  Indication  Answer:  Altered mental status (if no other cause identified)   12/22/21 1847            Signed, Terrilee Croak, MD [facility] 12/23/2021

## 2021-12-23 NOTE — Plan of Care (Signed)
  Problem: Clinical Measurements: Goal: Diagnostic test results will improve Outcome: Progressing   

## 2021-12-24 DIAGNOSIS — Z7401 Bed confinement status: Secondary | ICD-10-CM | POA: Diagnosis not present

## 2021-12-24 DIAGNOSIS — R531 Weakness: Secondary | ICD-10-CM | POA: Diagnosis not present

## 2021-12-24 DIAGNOSIS — G9341 Metabolic encephalopathy: Secondary | ICD-10-CM | POA: Diagnosis not present

## 2021-12-24 MED ORDER — CEFDINIR 300 MG PO CAPS: 300.0000 mg | ORAL_CAPSULE | Freq: Two times a day (BID) | ORAL | 0 refills | Status: AC

## 2021-12-24 MED ORDER — SACCHAROMYCES BOULARDII 250 MG PO CAPS: 250.0000 mg | ORAL_CAPSULE | Freq: Two times a day (BID) | ORAL | 0 refills | Status: AC

## 2021-12-24 NOTE — Discharge Summary (Signed)
Physician Discharge Summary  Danielle Bryan EHM:[id number] DOB: 12-Oct-1923 DOA: 12/22/2021  PCP: Velna Hatchet, MD  Admit date: 12/22/2021 Discharge date: 12/24/2021  Admitted From: ALF Discharge disposition: Back to ALF  Recommendations at discharge:  Complete 5-day course of oral Omnicef with probiotics   Brief narrative: Danielle Bryan is a 88 y.o. female with PMH significant for HTN, HLD, CVA on Plavix, hypothyroidism, osteoarthritis, depression, seizure-like activity on Depakote who is currently under hospice care at [facility] assisted living at [facility] 10/29, Danielle Bryan was sent to the ED for evaluation of altered mental status.  At baseline, Danielle Bryan is alert, awake, oriented x3, requires a wheelchair.  Per son, Danielle Bryan was at her usual baseline day prior to admission (10/28).  On 10/21, he noted that she was less interactive and clearly confused compared to her baseline.  She was complaining of nausea. EMS were called and she was brought to the ED for further evaluation.  In the ED, Danielle Bryan was hemodynamically stable Labs mostly unremarkable Flu, COVID PCR negative Urinalysis showed cloudy amber color urine, negative nitrates, moderate leukocytes Troponin negative x2 Portable chest x-ray negative for focal consolidation, edema, effusion. CT head without contrast negative for acute intracranial normalities. Given IV Rocephin. Admitted to [facility].  Subjective: Danielle Bryan was seen and examined this morning.. Propped up in bed.  Being fed by nursing staff.  Alert, awake.  Demented at baseline.  Slow to respond.  Able to tell me her date of birth.  Unable to answer other questions.  No family at bedside.  Hospital course: UTI Brought in for altered mentation. Urinalysis showed moderate leukocytes.   Urine culture is showing more than 100,000 CFU per mL of gram-negative rods.  Currently improving on IV Rocephin.  We will discharge her on 5 more days of oral Omnicef with  probiotics. WBC count normal. Recent Labs  Lab 12/22/21 1606 12/23/21 0422  WBC 6.1 5.2   Acute metabolic encephalopathy Suspected to be secondary to UTI.  CT head negative. Continue to monitor mental status change PTA on Depakote 500 mg daily, sertraline 50 mg daily.  Continue the same   History of seizure-like activity Continue Depakote.   History of CVA (cerebrovascular accident) HLD Continue Plavix,, Crestor   HTN  Continue amlodipine   Hypercholesterolemia Continue rosuvastatin.   Hypothyroidism TSH mildly elevated at 7.842 in setting of acute illness.  Free T4 likely up at 1.4. Continue Synthroid.  Wounds:  - Pressure Injury 01/13/21 Heel Left;Right Stage 1 -  Intact skin with non-blanchable redness of a localized area usually over a bony prominence. red, non blanchable (Active)  Date First Assessed/Time First Assessed: 01/13/21 1508   Location: Heel  Location Orientation: Left;Right  Staging: Stage 1 -  Intact skin with non-blanchable redness of a localized area usually over a bony prominence.  Wound Description (Comments): r...    Assessments 01/13/2021  3:08 PM 01/15/2021  8:00 PM  Dressing Type Foam - Lift dressing to assess site every shift Foam - Lift dressing to assess site every shift  Dressing Clean;Dry;Intact Clean;Dry;Intact  Dressing Change Frequency -- PRN  Site / Wound Assessment Red --  Wound Length (cm) 3 cm --  Wound Width (cm) 3 cm --  Wound Surface Area (cm^2) 9 cm^2 --  Treatment Cleansed --     No associated orders.    Discharge Exam:   Vitals:   12/23/21 2037 12/23/21 2352 12/24/21 0327 12/24/21 0744  BP: 124/63 129/72 131/69 131/65  Pulse: 82 73 82  81  Resp: '16 17 16 18  '$ Temp: 98.2 F (36.8 C) 97.9 F (36.6 C) 98.3 F (36.8 C) 97.7 F (36.5 C)  TempSrc: Oral Oral  Oral  SpO2: 96% 99% 99% 98%    There is no height or weight on file to calculate BMI.  General exam: Pleasant elderly Caucasian female.  Not in physical  distress Skin: No rashes, lesions or ulcers. HEENT: Atraumatic, normocephalic, no obvious bleeding Lungs: Clear to auscultation bilaterally CVS: Regular rate and rhythm, no murmur GI/Abd soft, nontender, nondistended, bowel sound present CNS: Alert, awake, due to self only Psychiatry: Mood appropriate Extremities: No pedal edema, no calf tenderness  Follow ups:    Follow-up Information     Velna Hatchet, MD Follow up.   Specialty: Internal Medicine Contact information: [address] [place] [address] [phone]                 Discharge Instructions:   Discharge Instructions     Call MD for:  difficulty breathing, headache or visual disturbances   Complete by: As directed    Call MD for:  extreme fatigue   Complete by: As directed    Call MD for:  hives   Complete by: As directed    Call MD for:  persistant dizziness or light-headedness   Complete by: As directed    Call MD for:  persistant nausea and vomiting   Complete by: As directed    Call MD for:  severe uncontrolled pain   Complete by: As directed    Call MD for:  temperature >100.4   Complete by: As directed    Diet general   Complete by: As directed    Discharge instructions   Complete by: As directed    Recommendations at discharge:   Complete 5-day course of oral Omnicef with probiotics  General discharge instructions: Follow with Primary MD Velna Hatchet, MD in 7 days  Please request your PCP  to go over your hospital tests, procedures, radiology results at the follow up. Please get your medicines reviewed and adjusted.  Your PCP may decide to repeat certain labs or tests as needed. Do not drive, operate heavy machinery, perform activities at heights, swimming or participation in water activities or provide baby sitting services if your were admitted for syncope or siezures until you have seen by Primary MD or a Neurologist and advised to do so again. [place] Controlled  Substance Reporting System database was reviewed. Do not drive, operate heavy machinery, perform activities at heights, swim, participate in water activities or provide baby-sitting services while on medications for pain, sleep and mood until your outpatient physician has reevaluated you and advised to do so again.  You are strongly recommended to comply with the dose, frequency and duration of prescribed medications. Activity: As tolerated with Full fall precautions use walker/cane & assistance as needed Avoid using any recreational substances like cigarette, tobacco, alcohol, or non-prescribed drug. If you experience worsening of your admission symptoms, develop shortness of breath, life threatening emergency, suicidal or homicidal thoughts you must seek medical attention immediately by calling 911 or calling your MD immediately  if symptoms less severe. You must read complete instructions/literature along with all the possible adverse reactions/side effects for all the medicines you take and that have been prescribed to you. Take any new medicine only after you have completely understood and accepted all the possible adverse reactions/side effects.  Wear Seat belts while driving. You were cared for by a  hospitalist during your hospital stay. If you have any questions about your discharge medications or the care you received while you were in the hospital after you are discharged, you can call the unit and ask to speak with the hospitalist or the covering physician. Once you are discharged, your primary care physician will handle any further medical issues. Please note that NO REFILLS for any discharge medications will be authorized once you are discharged, as it is imperative that you return to your primary care physician (or establish a relationship with a primary care physician if you do not have one).   Increase activity slowly   Complete by: As directed        Discharge Medications:   Allergies  as of 12/24/2021       Reactions   Escitalopram Oxalate Nausea Only   Celexa [citalopram] Other (See Comments)   Unknown   Lisinopril Other (See Comments)   Very decreased blood pressure    Statins Other (See Comments)   myalgias   Sulfa Antibiotics Hives, Other (See Comments)        Medication List     TAKE these medications    acetaminophen 500 MG tablet Commonly known as: TYLENOL Take 1,000 mg by mouth in the morning, at noon, and at bedtime.   amLODipine 10 MG tablet Commonly known as: NORVASC Take 1 tablet (10 mg total) by mouth daily.   benzonatate 200 MG capsule Commonly known as: TESSALON Take 1 capsule (200 mg total) by mouth 3 (three) times daily as needed for cough. What changed: when to take this   Biofreeze 4 % Gel Generic drug: Menthol (Topical Analgesic) Apply 1 Application topically every 12 (twelve) hours as needed (shoulder pain).   cefdinir 300 MG capsule Commonly known as: OMNICEF Take 1 capsule (300 mg total) by mouth 2 (two) times daily for 5 days.   clopidogrel 75 MG tablet Commonly known as: PLAVIX Take 1 tablet (75 mg total) by mouth daily. What changed: when to take this   divalproex 500 MG 24 hr tablet Commonly known as: DEPAKOTE ER TAKE 1 TABLET BY MOUTH EVERY DAY   levothyroxine 112 MCG tablet Commonly known as: SYNTHROID Take 112 mcg by mouth daily before breakfast.   levothyroxine 100 MCG tablet Commonly known as: SYNTHROID Take 1 tablet (100 mcg total) by mouth See admin instructions. Take one tablet (50 mcg) by mouth 6 days weekly in the morning, skip Sundays   pantoprazole 40 MG tablet Commonly known as: PROTONIX Take 40 mg by mouth at bedtime.   rosuvastatin 10 MG tablet Commonly known as: CRESTOR Take 10 mg by mouth at bedtime.   saccharomyces boulardii 250 MG capsule Commonly known as: FLORASTOR Take 1 capsule (250 mg total) by mouth 2 (two) times daily for 5 days.   sertraline 50 MG tablet Commonly known as:  ZOLOFT Take 1 tablet (50 mg total) by mouth daily.   vitamin E 180 MG (400 UNITS) capsule Take 400 Units by mouth at bedtime.         The results of significant diagnostics from this hospitalization (including imaging, microbiology, ancillary and laboratory) are listed below for reference.    Procedures and Diagnostic Studies:   CT Head Wo Contrast  Result Date: 12/22/2021 CLINICAL DATA:  Mental status change of unknown cause. EXAM: CT HEAD WITHOUT CONTRAST TECHNIQUE: Contiguous axial images were obtained from the base of the skull through the vertex without intravenous contrast. RADIATION DOSE REDUCTION: This exam was performed according  to the departmental dose-optimization program which includes automated exposure control, adjustment of the mA and/or kV according to Danielle Bryan size and/or use of iterative reconstruction technique. COMPARISON:  03/22/2021. FINDINGS: Brain: No evidence of acute infarction, hemorrhage, hydrocephalus, extra-axial collection or mass lesion/mass effect. There is ventricular and sulcal enlargement reflecting age related volume loss. Patchy bilateral white matter hypoattenuation is present consistent with moderate chronic microvascular ischemic change. These findings are stable. Vascular: No hyperdense vessel or unexpected calcification. Skull: Normal. Negative for fracture or focal lesion. Sinuses/Orbits: Globes and orbits are unremarkable. The visualized sinuses are clear. Other: None. IMPRESSION: 1. No acute intracranial abnormalities. Stable appearance from the prior study. Electronically Signed   By: Lajean Manes M.D.   On: 12/22/2021 17:42   DG Chest Portable 1 View  Result Date: 12/22/2021 CLINICAL DATA:  Altered mental status EXAM: PORTABLE CHEST 1 VIEW COMPARISON:  March 22, 2021 chest x-ray FINDINGS: The heart, hila, mediastinum, lungs, and pleura are unchanged with no acute abnormality. IMPRESSION: No active disease. Electronically Signed   By: Dorise Bullion III M.D.   On: 12/22/2021 15:50     Labs:   Basic Metabolic Panel: Recent Labs  Lab 12/22/21 1606 12/22/21 1738 12/23/21 0422  NA HEMOLYZED SPECIMEN - SUGGEST RECOLLECT 136 137  K HEMOLYZED SPECIMEN - SUGGEST RECOLLECT 3.7 3.5  CL HEMOLYZED SPECIMEN - SUGGEST RECOLLECT 98 102  CO2 HEMOLYZED SPECIMEN - SUGGEST RECOLLECT 27 27  GLUCOSE HEMOLYZED SPECIMEN - SUGGEST RECOLLECT 114* 91  BUN HEMOLYZED SPECIMEN - SUGGEST RECOLLECT 10 7*  CREATININE HEMOLYZED SPECIMEN - SUGGEST RECOLLECT 0.61 0.42*  CALCIUM HEMOLYZED SPECIMEN - SUGGEST RECOLLECT 9.4 9.0  MG  --  1.8  --    GFR CrCl cannot be calculated (Unknown ideal weight.). Liver Function Tests: Recent Labs  Lab 12/22/21 1606 12/22/21 1738  AST HEMOLYZED SPECIMEN - SUGGEST RECOLLECT 16  ALT HEMOLYZED SPECIMEN - SUGGEST RECOLLECT 6  ALKPHOS HEMOLYZED SPECIMEN - SUGGEST RECOLLECT 44  BILITOT HEMOLYZED SPECIMEN - SUGGEST RECOLLECT 0.5  PROT HEMOLYZED SPECIMEN - SUGGEST RECOLLECT 6.2*  ALBUMIN HEMOLYZED SPECIMEN - SUGGEST RECOLLECT 3.5   No results for input(s): "LIPASE", "AMYLASE" in the last 168 hours. No results for input(s): "AMMONIA" in the last 168 hours. Coagulation profile No results for input(s): "INR", "PROTIME" in the last 168 hours.  CBC: Recent Labs  Lab 12/22/21 1606 12/23/21 0422  WBC 6.1 5.2  NEUTROABS 4.0  --   HGB 13.7 10.3*  HCT 41.6 32.1*  MCV 91.8 92.2  PLT 151 129*   Cardiac Enzymes: No results for input(s): "CKTOTAL", "CKMB", "CKMBINDEX", "TROPONINI" in the last 168 hours. BNP: Invalid input(s): "POCBNP" CBG: No results for input(s): "GLUCAP" in the last 168 hours. D-Dimer No results for input(s): "DDIMER" in the last 72 hours. Hgb A1c No results for input(s): "HGBA1C" in the last 72 hours. Lipid Profile No results for input(s): "CHOL", "HDL", "LDLCALC", "TRIG", "CHOLHDL", "LDLDIRECT" in the last 72 hours. Thyroid function studies Recent Labs    12/22/21 1606  TSH 7.842*    Anemia work up No results for input(s): "VITAMINB12", "FOLATE", "FERRITIN", "TIBC", "IRON", "RETICCTPCT" in the last 72 hours. Microbiology Recent Results (from the past 240 hour(s))  Urine Culture     Status: Abnormal (Preliminary result)   Collection Time: 12/22/21  3:27 PM   Specimen: Urine, Clean Catch  Result Value Ref Range Status   Specimen Description URINE, CLEAN CATCH  Final   Special Requests NONE  Final   Culture (A)  Final    >=  100,000 COLONIES/mL GRAM NEGATIVE RODS SUSCEPTIBILITIES TO FOLLOW Performed at [facility], [place] [address], [place], [place] [address]    Report Status PENDING  Incomplete  Resp panel by RT-PCR (RSV, Flu A&B, Covid) Anterior Nasal Swab     Status: None   Collection Time: 12/22/21  3:31 PM   Specimen: Anterior Nasal Swab  Result Value Ref Range Status   SARS Coronavirus 2 by RT PCR NEGATIVE NEGATIVE Final    Comment: (NOTE) SARS-CoV-2 target nucleic acids are NOT DETECTED.  The SARS-CoV-2 RNA is generally detectable in upper respiratory specimens during the acute phase of infection. The lowest concentration of SARS-CoV-2 viral copies this assay can detect is 138 copies/mL. A negative result does not preclude SARS-Cov-2 infection and should not be used as the sole basis for treatment or other Danielle Bryan management decisions. A negative result may occur with  improper specimen collection/handling, submission of specimen other than nasopharyngeal swab, presence of viral mutation(s) within the areas targeted by this assay, and inadequate number of viral copies(<138 copies/mL). A negative result must be combined with clinical observations, Danielle Bryan history, and epidemiological information. The expected result is Negative.  Fact Sheet for Patients:  EntrepreneurPulse.com.au  Fact Sheet for Healthcare Providers:  IncredibleEmployment.be  This test is no t yet approved or cleared by the [place] FDA  and  has been authorized for detection and/or diagnosis of SARS-CoV-2 by FDA under an Emergency Use Authorization (EUA). This EUA will remain  in effect (meaning this test can be used) for the duration of the COVID-19 declaration under Section 564(b)(1) of the Act, 21 U.S.C.section 360bbb-3(b)(1), unless the authorization is terminated  or revoked sooner.       Influenza A by PCR NEGATIVE NEGATIVE Final   Influenza B by PCR NEGATIVE NEGATIVE Final    Comment: (NOTE) The Xpert Xpress SARS-CoV-2/FLU/RSV plus assay is intended as an aid in the diagnosis of influenza from Nasopharyngeal swab specimens and should not be used as a sole basis for treatment. Nasal washings and aspirates are unacceptable for Xpert Xpress SARS-CoV-2/FLU/RSV testing.  Fact Sheet for Patients: EntrepreneurPulse.com.au  Fact Sheet for Healthcare Providers: IncredibleEmployment.be  This test is not yet approved or cleared by the [place] FDA and has been authorized for detection and/or diagnosis of SARS-CoV-2 by FDA under an Emergency Use Authorization (EUA). This EUA will remain in effect (meaning this test can be used) for the duration of the COVID-19 declaration under Section 564(b)(1) of the Act, 21 U.S.C. section 360bbb-3(b)(1), unless the authorization is terminated or revoked.     Resp Syncytial Virus by PCR NEGATIVE NEGATIVE Final    Comment: (NOTE) Fact Sheet for Patients: EntrepreneurPulse.com.au  Fact Sheet for Healthcare Providers: IncredibleEmployment.be  This test is not yet approved or cleared by the [place] FDA and has been authorized for detection and/or diagnosis of SARS-CoV-2 by FDA under an Emergency Use Authorization (EUA). This EUA will remain in effect (meaning this test can be used) for the duration of the COVID-19 declaration under Section 564(b)(1) of the Act, 21 U.S.C. section 360bbb-3(b)(1),  unless the authorization is terminated or revoked.  Performed at [facility], [place] [address], [place], [place] [address]     Time coordinating discharge: 35 minutes  Signed: Yeimi Debnam  [facility] 12/24/2021, 10:07 AM

## 2021-12-24 NOTE — NC FL2 (Signed)
[place] LEVEL OF CARE SCREENING TOOL     IDENTIFICATION  Patient Name: Danielle Bryan Birthdate: 09/12/1923 Sex: female Admission Date (Current Location): 12/22/2021  [facility] and [place] Number:  Herbalist and Address:  [facility], [place] [address] [place], [place] [address]      Provider Number: [id number]  Attending Physician Name and Address:  Terrilee Croak, MD  Relative Name and Phone Number:  Lavida Patch, son - [phone]    Current Level of Care: Hospital Recommended Level of Care: [place] Prior Approval Number:    Date Approved/Denied:   PASRR Number:    Discharge Plan: Other (Comment) ([place])    Current Diagnoses: Patient Active Problem List   Diagnosis Date Noted   History of CVA (cerebrovascular accident) 12/22/2021   History of seizure-like activity 12/22/2021   Severe sepsis ([place]) 03/22/2021   Chronic diastolic CHF (congestive heart failure) ([place]) 03/22/2021   GERD (gastroesophageal reflux disease) 03/22/2021   Depression 03/22/2021   Pressure injury of skin 01/13/2021   Fall 01/12/2021   Nasal bone fractures 01/12/2021   Pain due to onychomycosis of toenails of both feet 05/04/2019   Coagulation defect ([place]) 05/04/2019   Vertigo 01/27/2019   CVA (cerebral vascular accident) ([place]) 01/27/2019   Aphasia 03/20/2016   HTN (hypertension) 03/20/2016   Weakness 03/18/2015   Dehydration 03/18/2015   Nausea and vomiting 03/18/2015   Thrombocytopenia ([place]) 03/18/2015   Moderate nausea and vomiting 03/18/2015   Elevated lactic acid level 03/18/2015   Elevated troponin 71/07/2692   Acute metabolic encephalopathy 85/46/2703   Urinary tract infection 08/22/2014   TIA (transient ischemic attack) 03/19/2014   Benign hypertensive heart disease without heart failure 08/20/2010   Hypothyroidism    Osteoarthritis    Hypercholesterolemia    Reactive depression (situational)      Orientation RESPIRATION BLADDER Height & Weight        Normal External catheter Weight:   Height:     BEHAVIORAL SYMPTOMS/MOOD NEUROLOGICAL BOWEL NUTRITION STATUS      Continent Diet (See discharge summary)  AMBULATORY STATUS COMMUNICATION OF NEEDS Skin   Extensive Assist Verbally Other (Comment) (redness to buttocks)                       Personal Care Assistance Level of Assistance  Bathing, Feeding, Dressing Bathing Assistance: Maximum assistance Feeding assistance: Maximum assistance Dressing Assistance: Maximum assistance     Functional Limitations Info  Sight, Hearing, Speech Sight Info: Impaired Hearing Info: Impaired Speech Info: Adequate    SPECIAL CARE FACTORS FREQUENCY  PT (By licensed PT), OT (By licensed OT)     PT Frequency: Home Health ordered OT Frequency: Home Health ordered            Contractures Contractures Info: Not present    Additional Factors Info  Code Status, Allergies, Psychotropic Code Status Info: DNR Allergies Info: Escitalopram, Celexa, Lisinopril, Statin, Sulfa Psychotropic Info: Depakote, Zoloft         Current Medications (12/24/2021):  This is the current hospital active medication list Current Facility-Administered Medications  Medication Dose Route Frequency Provider Last Rate Last Admin   acetaminophen (TYLENOL) tablet 650 mg  650 mg Oral Q6H PRN Lenore Cordia, MD       Or   acetaminophen (TYLENOL) suppository 650 mg  650 mg Rectal Q6H PRN Lenore Cordia, MD       amLODipine (NORVASC) tablet 10  mg  10 mg Oral Daily Zada Finders R, MD   10 mg at 12/24/21 0904   cefTRIAXone (ROCEPHIN) 1 g in sodium chloride 0.9 % 100 mL IVPB  1 g Intravenous Q24H Zada Finders R, MD 200 mL/hr at 12/24/21 0902 1 g at 12/24/21 0902   clopidogrel (PLAVIX) tablet 75 mg  75 mg Oral q morning Zada Finders R, MD   75 mg at 12/24/21 0903   divalproex (DEPAKOTE ER) 24 hr tablet 500 mg  500 mg Oral q morning Zada Finders R, MD   500 mg  at 12/24/21 0903   enoxaparin (LOVENOX) injection 30 mg  30 mg Subcutaneous Q24H Zada Finders R, MD   30 mg at 12/23/21 2039   ondansetron (ZOFRAN) tablet 4 mg  4 mg Oral Q6H PRN Lenore Cordia, MD       Or   ondansetron (ZOFRAN) injection 4 mg  4 mg Intravenous Q6H PRN Zada Finders R, MD       rosuvastatin (CRESTOR) tablet 10 mg  10 mg Oral QHS Zada Finders R, MD   10 mg at 12/23/21 2038   senna-docusate (Senokot-S) tablet 1 tablet  1 tablet Oral QHS PRN Lenore Cordia, MD       sertraline (ZOLOFT) tablet 50 mg  50 mg Oral Daily Lenore Cordia, MD   50 mg at 12/24/21 3582     Discharge Medications: Please see discharge summary for a list of discharge medications.  Relevant Imaging Results:  Relevant Lab Results:   Additional Information SS#: [id number]  Curlene Labrum, RN

## 2021-12-24 NOTE — Progress Notes (Signed)
Called [facility] at [phone], spoke to desire' e. Nurse has no questions or concerns about patient returning. Patient is educated that she was returning to [place] this afternoon. Patient is A&O x1 with poor communication. No SS of pain or distress at this time.

## 2021-12-24 NOTE — TOC Transition Note (Addendum)
Transition of Care [facility]) - CM/SW Discharge Note   Patient Details  Name: Danielle Bryan MRN: [id number] Date of Birth: [birth year]  [facility] CM/SW Contact:  Curlene Labrum, RN Phone Number: 12/24/2021, 10:40 AM   Clinical Narrative:    CM called and spoke with York Cerise, DON at [facility] in [place] - [phone] and FL2 was requested to be completed and sent back to the facility at fax # [phone].  Discharge summary, home health orders and updated FL2 were faxed to the facility.  CM will follow up with the facility to be sure updated discharge clinicals were received.  PTAR will be coordinated for transportation back to the facility.  Bedside nursing - please call [facility] at [phone] to provide nursing report prior to patient's return to the facility.  12/24/2021 1155 - I called Desiree, DON at [facility] and she received clinicals and gave permission to send patient back to the facility.  PTAR was called and the bedside nurse will call report to the facility.  I called the patient's son and he is aware that patient will discharge back to the facility.   Final next level of care: [place] Barriers to Discharge: Continued Medical Work up   Patient Goals and CMS Choice Patient states their goals for this hospitalization and ongoing recovery are:: To return to [facility] in [place], [place] CMS Medicare.gov Compare Post Acute Care list provided to:: Patient Choice offered to / list presented to : Adult Children  Discharge Placement                       Discharge Plan and Services   Discharge Planning Services: CM Consult Post Acute Care Choice: Home Health                    HH Arranged: PT, OT [place] Agency:  [facility] to order home health provider) Date [facility] Agency Contacted: 12/23/21 Time [place]: 1520 Representative spoke with at [place]: spoke with Genella Mech, [place] at  [place] and she will fax home health orders to their provider  Social Determinants of Health (SDOH) Interventions     Readmission Risk Interventions     No data to display

## 2021-12-25 LAB — URINE CULTURE: Culture: >=100000 — AB

## 2022-01-19 ENCOUNTER — Encounter (HOSPITAL_COMMUNITY): Payer: Self-pay

## 2022-01-19 ENCOUNTER — Emergency Department (HOSPITAL_COMMUNITY): Payer: Medicare HMO

## 2022-01-19 ENCOUNTER — Other Ambulatory Visit: Payer: Self-pay

## 2022-01-19 ENCOUNTER — Emergency Department (HOSPITAL_COMMUNITY)
Admission: EM | Admit: 2022-01-19 | Discharge: 2022-01-19 | Disposition: A | Discharge status: Skilled Nursing Facility | Payer: Medicare HMO | Attending: Emergency Medicine | Admitting: Emergency Medicine

## 2022-01-19 DIAGNOSIS — Z7902 Long term (current) use of antithrombotics/antiplatelets: Secondary | ICD-10-CM | POA: Insufficient documentation

## 2022-01-19 DIAGNOSIS — S50311A Abrasion of right elbow, initial encounter: Secondary | ICD-10-CM | POA: Diagnosis not present

## 2022-01-19 DIAGNOSIS — Z7401 Bed confinement status: Secondary | ICD-10-CM | POA: Diagnosis not present

## 2022-01-19 DIAGNOSIS — Z043 Encounter for examination and observation following other accident: Secondary | ICD-10-CM | POA: Diagnosis not present

## 2022-01-19 DIAGNOSIS — I10 Essential (primary) hypertension: Secondary | ICD-10-CM | POA: Insufficient documentation

## 2022-01-19 DIAGNOSIS — W19XXXA Unspecified fall, initial encounter: Secondary | ICD-10-CM | POA: Diagnosis not present

## 2022-01-19 DIAGNOSIS — T68XXXA Hypothermia, initial encounter: Secondary | ICD-10-CM | POA: Diagnosis not present

## 2022-01-19 DIAGNOSIS — E039 Hypothyroidism, unspecified: Secondary | ICD-10-CM | POA: Insufficient documentation

## 2022-01-19 DIAGNOSIS — I959 Hypotension, unspecified: Secondary | ICD-10-CM | POA: Diagnosis not present

## 2022-01-19 DIAGNOSIS — R531 Weakness: Secondary | ICD-10-CM | POA: Diagnosis not present

## 2022-01-19 DIAGNOSIS — W01198A Fall on same level from slipping, tripping and stumbling with subsequent striking against other object, initial encounter: Secondary | ICD-10-CM | POA: Insufficient documentation

## 2022-01-19 DIAGNOSIS — Z79899 Other long term (current) drug therapy: Secondary | ICD-10-CM | POA: Diagnosis not present

## 2022-01-19 DIAGNOSIS — S0003XA Contusion of scalp, initial encounter: Secondary | ICD-10-CM | POA: Diagnosis not present

## 2022-01-19 DIAGNOSIS — R9082 White matter disease, unspecified: Secondary | ICD-10-CM | POA: Diagnosis not present

## 2022-01-19 DIAGNOSIS — S0990XA Unspecified injury of head, initial encounter: Secondary | ICD-10-CM | POA: Diagnosis not present

## 2022-01-19 DIAGNOSIS — M7989 Other specified soft tissue disorders: Secondary | ICD-10-CM | POA: Diagnosis not present

## 2022-01-19 MED ORDER — BACITRACIN ZINC 500 UNIT/GM EX OINT
TOPICAL_OINTMENT | Freq: Two times a day (BID) | CUTANEOUS | Status: DC
  Administered 2022-01-19: 1 via TOPICAL
  Filled 2022-01-19: qty 0.9

## 2022-01-19 MED ORDER — ACETAMINOPHEN 325 MG PO TABS
650.0000 mg | ORAL_TABLET | Freq: Once | ORAL | Status: AC
  Administered 2022-01-19: 650 mg via ORAL
  Filled 2022-01-19: qty 2

## 2022-01-19 NOTE — ED Notes (Signed)
PTAR at bedside 

## 2022-01-19 NOTE — ED Notes (Signed)
Call facility four times for report, but no one came to the floor

## 2022-01-19 NOTE — ED Notes (Signed)
Patient left with PTAR in stable condition with her son present at bedside.

## 2022-01-19 NOTE — ED Triage Notes (Signed)
Pt was in bed and rolled out - hit head - on Plavix - has hematoma and skin tear on right forehead and cheek.  Pt denies any pain - A&O with EMS.  Pt is from [place] @ [facility].

## 2022-01-19 NOTE — ED Provider Notes (Signed)
  Physical Exam  BP 128/85   Pulse 90   Temp 97.8 F (36.6 C) (Oral)   Resp 18   Ht '5\' 1"'$  (1.549 m)   Wt 54.4 kg   SpO2 98%   BMI 22.66 kg/m   Physical Exam Constitutional:      Comments: NAD  HENT:     Head:     Comments: Right frontal scalp abrasion and contusion Cardiovascular:     Rate and Rhythm: Normal rate.  Pulmonary:     Effort: Pulmonary effort is normal.  Musculoskeletal:        General: Normal range of motion.     Comments: Right elbow abrasion  Neurological:     Mental Status: She is alert.     Procedures  Procedures  ED Course / MDM   Clinical Course as of 01/19/22 0905  Sun Jan 19, 2022  0705 88 yo F who is on Plavix fell out of bed and hit head. Follow up on CTH and CT Cspine. [VB]  [id number] Signed out to Dr. Nechama Guard at shift change to follow-up on CT imaging [DW]  0900 Reassessed patient who is at neurologic baseline per family.  Her x-ray of elbow showed no acute traumatic injury and CT head and C-spine which I personally reviewed showed no acute traumatic injury. Local wound care to scalp abrasion and right elbow abrasion.  She is safe to discharge home with follow-up with PCP and strict return precautions discussed.  Family in agreement with plan. [VB]    Clinical Course User Index [DW] Ripley Fraise, MD [VB] Elgie Congo, MD   Medical Decision Making Amount and/or Complexity of Data Reviewed Radiology: ordered.  Risk OTC drugs.          Elgie Congo, MD 01/19/22 [phone]

## 2022-01-19 NOTE — Discharge Instructions (Addendum)
You have been seen in the Emergency Department (ED) today following a fall.  Your workup today did not reveal any injuries that require you to stay in the hospital. You can expect to be stiff and sore for the next several days.  Please take Tylenol or Motrin as needed for pain, but only as written on the box.  Please follow up with your primary care doctor as soon as possible regarding today's ED visit and your recent accident.   Call your doctor or return to the ED if you develop a sudden or severe headache, confusion, slurred speech, facial droop, weakness or numbness in any arm or leg,  extreme fatigue, vomiting more than two times, severe abdominal pain, difficulty breathing or any other concerning signs or symptoms.

## 2022-01-19 NOTE — ED Provider Notes (Signed)
[facility] EMERGENCY DEPARTMENT Provider Note   CSN: [id number] Arrival date & time: 01/19/22  5009     History  Chief Complaint  Patient presents with   Danielle Bryan is a 88 y.o. female.   Fall Associated symptoms include headaches.   Patient with history of previous CVA, hypertension presents after fall.  Patient lives in a local nursing facility.  She reports she rolled out of bed and hit her head.  She takes Plavix  Patient reports mild headache but no other acute complaints.  Denies neck or back pain.  No chest or abdominal pain.  She has a skin tear to her right arm, but no other extremity trauma   Past Medical History:  Diagnosis Date   Hypercholesterolemia    Hypertension    Hypothyroidism    Osteoarthritis    Reactive depression (situational)    Stroke ([place])    TIA (transient ischemic attack)    on plavix    Home Medications Prior to Admission medications   Medication Sig Start Date End Date Taking? Authorizing Provider  acetaminophen (TYLENOL) 500 MG tablet Take 1,000 mg by mouth in the morning, at noon, and at bedtime.    [provider]  amLODipine (NORVASC) 10 MG tablet Take 1 tablet (10 mg total) by mouth daily. 03/27/21   Shelly Coss, MD  benzonatate (TESSALON) 200 MG capsule Take 1 capsule (200 mg total) by mouth 3 (three) times daily as needed for cough. Patient taking differently: Take 200 mg by mouth every 8 (eight) hours as needed for cough. 03/26/21   Shelly Coss, MD  clopidogrel (PLAVIX) 75 MG tablet Take 1 tablet (75 mg total) by mouth daily. Patient taking differently: Take 75 mg by mouth every morning. 03/21/14   Velna Hatchet, MD  divalproex (DEPAKOTE ER) 500 MG 24 hr tablet TAKE 1 TABLET BY MOUTH EVERY DAY Patient taking differently: Take 500 mg by mouth daily. 04/19/20   Frann Rider, NP  levothyroxine (SYNTHROID) 100 MCG tablet Take 1 tablet (100 mcg total) by mouth See admin instructions. Take  one tablet (50 mcg) by mouth 6 days weekly in the morning, skip Sundays Patient not taking: Reported on 12/23/2021 03/26/21   Shelly Coss, MD  levothyroxine (SYNTHROID) 112 MCG tablet Take 112 mcg by mouth daily before breakfast.    [provider]  Menthol, Topical Analgesic, (BIOFREEZE) 4 % GEL Apply 1 Application topically every 12 (twelve) hours as needed (shoulder pain).    [provider]  pantoprazole (PROTONIX) 40 MG tablet Take 40 mg by mouth at bedtime.    [provider]  rosuvastatin (CRESTOR) 10 MG tablet Take 10 mg by mouth at bedtime.    [provider]  sertraline (ZOLOFT) 50 MG tablet Take 1 tablet (50 mg total) by mouth daily. 06/28/20   Frann Rider, NP  vitamin E 180 MG (400 UNITS) capsule Take 400 Units by mouth at bedtime.    [provider]      Allergies    Escitalopram oxalate, Celexa [citalopram], Lisinopril, Statins, and Sulfa antibiotics    Review of Systems   Review of Systems  Skin:  Positive for wound.  Neurological:  Positive for headaches.    Physical Exam Updated Vital Signs SpO2 98%  Physical Exam CONSTITUTIONAL: Elderly and frail HEAD: Hematoma noted to right forehead, no other signs of trauma EYES: EOMI ENMT: Mucous membranes moist, no visible facial trauma NECK: supple no meningeal signs SPINE/BACK:entire spine  nontender Kyphotic spine No bruising/crepitance/stepoffs noted to spine CV: S1/S2 noted LUNGS: Lungs are clear to auscultation bilaterally, no apparent distress Chest-no bruising or crepitus ABDOMEN: soft, nontender NEURO: Pt is awake/alert/appropriate, moves all extremitiesx4.  No facial droop.   EXTREMITIES: pulses normal/equal, full ROM Skin tear to right arm. All other extremities/joints palpated/ranged and nontender SKIN: warm, color normal PSYCH: no abnormalities of mood noted, alert and oriented to situation  ED Results / Procedures / Treatments   Labs (all labs ordered are  listed, but only abnormal results are displayed) Labs Reviewed - No data to display  EKG None  Radiology No results found.  Procedures Procedures    Medications Ordered in ED Medications - No data to display  ED Course/ Medical Decision Making/ A&P Clinical Course as of 01/19/22 7591  Sun Jan 19, 2022  0705 88 yo F who is on Plavix fell out of bed and hit head. Follow up on CTH and CT Cspine. [VB]  0709 Signed out to Dr. Nechama Guard at shift change to follow-up on CT imaging [DW]    Clinical Course User Index [DW] Ripley Fraise, MD [VB] Elgie Congo, MD                           Medical Decision Making Amount and/or Complexity of Data Reviewed Radiology: ordered.   This patient presents to the ED for concern of head injury, this involves an extensive number of treatment options, and is a complaint that carries with it a high risk of complications and morbidity.  The differential diagnosis includes but is not limited to subdural hematoma, subarachnoid hemorrhage, skull fracture  Comorbidities that complicate the patient evaluation: Patient's presentation is complicated by their history of CVA  Social Determinants of Health: Patient's  frailty and limited mobility   increases the complexity of managing their presentation  Additional history obtained: Additional history obtained from EMS  Records reviewed previous admission documents   Imaging Studies ordered: I ordered imaging studies including CT scan head and C-spine      Complexity of problems addressed: Patient's presentation is most consistent with  acute presentation with potential threat to life or bodily function           Final Clinical Impression(s) / ED Diagnoses Final diagnoses:  None    Rx / DC Orders ED Discharge Orders     None         Ripley Fraise, MD 01/19/22 0710

## 2022-01-19 NOTE — Progress Notes (Signed)
   01/19/22 0635  Clinical Encounter Type  Visited With Patient  Visit Type Initial;Trauma  Referral From Nurse  Consult/Referral To Chaplain   Chaplain responded to a level two trauma. The patient, Danielle Bryan, was alert and spoke clearly as we interacted. She is expecting her son to arrive soon. [name] said she was fine otherwise.  The medical team prepared to provide care for her wound as I departed. If a chaplain is requested someone will respond.   [name] [facility]  [phone]

## 2022-01-19 NOTE — ED Notes (Signed)
Final got intact contact with the the facility and gave report to [place] Tech/CNA

## 2022-01-19 NOTE — ED Notes (Signed)
Patient awake and alert, no s/s of distress, family at bedside, PO fluids given,will continue to monitor.

## 2022-01-20 DIAGNOSIS — N390 Urinary tract infection, site not specified: Secondary | ICD-10-CM | POA: Diagnosis not present

## 2022-01-20 DIAGNOSIS — G9341 Metabolic encephalopathy: Secondary | ICD-10-CM | POA: Diagnosis not present

## 2022-01-20 DIAGNOSIS — I110 Hypertensive heart disease with heart failure: Secondary | ICD-10-CM | POA: Diagnosis not present

## 2022-01-20 DIAGNOSIS — R54 Age-related physical debility: Secondary | ICD-10-CM | POA: Diagnosis not present

## 2022-02-04 ENCOUNTER — Emergency Department (HOSPITAL_COMMUNITY)
Admission: EM | Admit: 2022-02-04 | Discharge: 2022-02-05 | Disposition: A | Discharge status: Home / Self Care | Payer: Medicare HMO | Attending: Emergency Medicine | Admitting: Emergency Medicine

## 2022-02-04 ENCOUNTER — Emergency Department (HOSPITAL_COMMUNITY): Payer: Medicare HMO

## 2022-02-04 ENCOUNTER — Encounter (HOSPITAL_COMMUNITY): Payer: Self-pay

## 2022-02-04 ENCOUNTER — Other Ambulatory Visit: Payer: Self-pay

## 2022-02-04 DIAGNOSIS — I6782 Cerebral ischemia: Secondary | ICD-10-CM | POA: Diagnosis not present

## 2022-02-04 DIAGNOSIS — Z79890 Hormone replacement therapy (postmenopausal): Secondary | ICD-10-CM | POA: Diagnosis not present

## 2022-02-04 DIAGNOSIS — E039 Hypothyroidism, unspecified: Secondary | ICD-10-CM | POA: Diagnosis not present

## 2022-02-04 DIAGNOSIS — I10 Essential (primary) hypertension: Secondary | ICD-10-CM | POA: Diagnosis not present

## 2022-02-04 DIAGNOSIS — R509 Fever, unspecified: Secondary | ICD-10-CM | POA: Insufficient documentation

## 2022-02-04 DIAGNOSIS — Z1152 Encounter for screening for COVID-19: Secondary | ICD-10-CM | POA: Diagnosis not present

## 2022-02-04 DIAGNOSIS — Z79899 Other long term (current) drug therapy: Secondary | ICD-10-CM | POA: Diagnosis not present

## 2022-02-04 LAB — CBC WITH DIFFERENTIAL/PLATELET
Abs Immature Granulocytes: 0.02 10*3/uL (ref 0.00–0.07)
Basophils Absolute: 0 10*3/uL (ref 0.0–0.1)
Basophils Relative: 0 %
Eosinophils Absolute: 0.1 10*3/uL (ref 0.0–0.5)
Eosinophils Relative: 1 %
HCT: 36.5 % (ref 36.0–46.0)
Hemoglobin: 11.5 g/dL — ABNORMAL LOW (ref 12.0–15.0)
Immature Granulocytes: 0 %
Lymphocytes Relative: 32 %
Lymphs Abs: 2 10*3/uL (ref 0.7–4.0)
MCH: 29 pg (ref 26.0–34.0)
MCHC: 31.5 g/dL (ref 30.0–36.0)
MCV: 92.2 fL (ref 80.0–100.0)
Monocytes Absolute: 0.6 10*3/uL (ref 0.1–1.0)
Monocytes Relative: 10 %
Neutro Abs: 3.4 10*3/uL (ref 1.7–7.7)
Neutrophils Relative %: 57 %
Platelets: 187 10*3/uL (ref 150–400)
RBC: 3.96 MIL/uL (ref 3.87–5.11)
RDW: 13.7 % (ref 11.5–15.5)
WBC: 6.1 10*3/uL (ref 4.0–10.5)
nRBC: 0 % (ref 0.0–0.2)

## 2022-02-04 LAB — COMPREHENSIVE METABOLIC PANEL
ALT: <5 U/L (ref 0–44)
AST: 14 U/L — ABNORMAL LOW (ref 15–41)
Albumin: 3.2 g/dL — ABNORMAL LOW (ref 3.5–5.0)
Alkaline Phosphatase: 45 U/L (ref 38–126)
Anion gap: 9 (ref 5–15)
BUN: 13 mg/dL (ref 8–23)
CO2: 29 mmol/L (ref 22–32)
Calcium: 9.1 mg/dL (ref 8.9–10.3)
Chloride: 100 mmol/L (ref 98–111)
Creatinine, Ser: 0.48 mg/dL (ref 0.44–1.00)
GFR, Estimated: >60 mL/min (ref >60)
Glucose, Bld: 92 mg/dL (ref 70–99)
Potassium: 3.8 mmol/L (ref 3.5–5.1)
Sodium: 138 mmol/L (ref 135–145)
Total Bilirubin: 0.8 mg/dL (ref 0.3–1.2)
Total Protein: 6.9 g/dL (ref 6.5–8.1)

## 2022-02-04 LAB — LACTIC ACID, PLASMA: Lactic Acid, Venous: 0.9 mmol/L (ref 0.5–1.9)

## 2022-02-04 MED ORDER — LACTATED RINGERS IV BOLUS
1000.0000 mL | Freq: Once | INTRAVENOUS | Status: AC
  Administered 2022-02-04: 1000 mL via INTRAVENOUS

## 2022-02-04 NOTE — ED Provider Notes (Signed)
[facility] DEPT Provider Note  CSN: [id number] Arrival date & time: 02/04/22 2056  Chief Complaint(s) Fever and possible UTI  HPI Danielle Bryan is a 88 y.o. female with history of hypertension, hypothyroidism, stroke, hyperlipidemia, dementia presenting to the hospital with fever.  Per her son, patient had a fever at her nursing home.  Possibly more lethargic although patient's son reports that she seems at her currently.  Patient denies any symptoms such as cough, sore throat, runny nose, abdominal pain, painful urination, headaches, nausea, vomiting. Does not think she had any of the symptoms at the nursing facility.  History limited due to dementia   Past Medical History Past Medical History:  Diagnosis Date   Hypercholesterolemia    Hypertension    Hypothyroidism    Osteoarthritis    Reactive depression (situational)    Stroke ([place])    TIA (transient ischemic attack)    on plavix   Patient Active Problem List   Diagnosis Date Noted   History of CVA (cerebrovascular accident) 12/22/2021   History of seizure-like activity 12/22/2021   Severe sepsis ([place]) 03/22/2021   Chronic diastolic CHF (congestive heart failure) ([place]) 03/22/2021   GERD (gastroesophageal reflux disease) 03/22/2021   Depression 03/22/2021   Pressure injury of skin 01/13/2021   Fall 01/12/2021   Nasal bone fractures 01/12/2021   Pain due to onychomycosis of toenails of both feet 05/04/2019   Coagulation defect ([place]) 05/04/2019   Vertigo 01/27/2019   CVA (cerebral vascular accident) ([place]) 01/27/2019   Aphasia 03/20/2016   HTN (hypertension) 03/20/2016   Weakness 03/18/2015   Dehydration 03/18/2015   Nausea and vomiting 03/18/2015   Thrombocytopenia ([place]) 03/18/2015   Moderate nausea and vomiting 03/18/2015   Elevated lactic acid level 03/18/2015   Elevated troponin 57/32/2025   Acute metabolic encephalopathy 42/70/6237   Urinary tract infection 08/22/2014   TIA  (transient ischemic attack) 03/19/2014   Benign hypertensive heart disease without heart failure 08/20/2010   Hypothyroidism    Osteoarthritis    Hypercholesterolemia    Reactive depression (situational)    Home Medication(s) Prior to Admission medications   Medication Sig Start Date End Date Taking? Authorizing Provider  acetaminophen (TYLENOL) 500 MG tablet Take 1,000 mg by mouth in the morning, at noon, and at bedtime.    [provider]  amLODipine (NORVASC) 10 MG tablet Take 1 tablet (10 mg total) by mouth daily. 03/27/21   Shelly Coss, MD  benzonatate (TESSALON) 200 MG capsule Take 1 capsule (200 mg total) by mouth 3 (three) times daily as needed for cough. Patient taking differently: Take 200 mg by mouth every 8 (eight) hours as needed for cough. 03/26/21   Shelly Coss, MD  clopidogrel (PLAVIX) 75 MG tablet Take 1 tablet (75 mg total) by mouth daily. Patient taking differently: Take 75 mg by mouth every morning. 03/21/14   Velna Hatchet, MD  divalproex (DEPAKOTE ER) 500 MG 24 hr tablet TAKE 1 TABLET BY MOUTH EVERY DAY Patient taking differently: Take 500 mg by mouth daily. 04/19/20   Frann Rider, NP  levothyroxine (SYNTHROID) 100 MCG tablet Take 1 tablet (100 mcg total) by mouth See admin instructions. Take one tablet (50 mcg) by mouth 6 days weekly in the morning, skip Sundays Patient not taking: Reported on 12/23/2021 03/26/21   Shelly Coss, MD  levothyroxine (SYNTHROID) 112 MCG tablet Take 112 mcg by mouth daily before breakfast.    [provider]  Menthol, Topical Analgesic, (BIOFREEZE) 4 % GEL Apply 1  Application topically every 12 (twelve) hours as needed (shoulder pain).    [provider]  pantoprazole (PROTONIX) 40 MG tablet Take 40 mg by mouth at bedtime.    [provider]  rosuvastatin (CRESTOR) 10 MG tablet Take 10 mg by mouth at bedtime.    [provider]  sertraline (ZOLOFT) 50 MG tablet Take 1 tablet (50 mg  total) by mouth daily. 06/28/20   Frann Rider, NP  vitamin E 180 MG (400 UNITS) capsule Take 400 Units by mouth at bedtime.    [provider]                                                                                                                                    Past Surgical History Past Surgical History:  Procedure Laterality Date   CATARACT EXTRACTION Left 1992   CATARACT EXTRACTION Right 1995   DEBRIDEMENT AND CLOSURE WOUND     Family History Family History  Problem Relation Age of Onset   Heart failure Mother    Pneumonia Mother    Heart failure Father     Social History Social History   Tobacco Use   Smoking status: Never   Smokeless tobacco: Never  Substance Use Topics   Alcohol use: No    Alcohol/week: 0.0 standard drinks of alcohol   Drug use: No   Allergies Escitalopram oxalate, Celexa [citalopram], Lisinopril, Statins, and Sulfa antibiotics  Review of Systems Review of Systems  Unable to perform ROS: Dementia  All other systems reviewed and are negative.   Physical Exam Vital Signs  I have reviewed the triage vital signs BP (!) 143/69   Pulse 85   Temp 98.5 F (36.9 C) (Oral)   Resp 19   Ht '5\' 1"'$  (1.549 m)   Wt 54.4 kg   SpO2 100%   BMI 22.66 kg/m  Physical Exam Vitals and nursing note reviewed.  Constitutional:      General: She is not in acute distress.    Appearance: She is well-developed.  HENT:     Head: Normocephalic and atraumatic.     Mouth/Throat:     Mouth: Mucous membranes are moist.  Eyes:     Pupils: Pupils are equal, round, and reactive to light.  Cardiovascular:     Rate and Rhythm: Normal rate and regular rhythm.     Heart sounds: No murmur heard. Pulmonary:     Effort: Pulmonary effort is normal. No respiratory distress.     Breath sounds: Normal breath sounds.  Abdominal:     General: Abdomen is flat.     Palpations: Abdomen is soft.     Tenderness: There is no abdominal tenderness.   Musculoskeletal:        General: No tenderness.     Right lower leg: No edema.     Left lower leg: No edema.  Skin:    General: Skin is warm and  dry.  Neurological:     General: No focal deficit present.     Mental Status: She is alert. Mental status is at baseline.     Comments: Oriented to self, place.  Not to year or situation.  Moving all 4 extremities equally.  Cranial nerves grossly intact.  Psychiatric:        Mood and Affect: Mood normal.        Behavior: Behavior normal.     ED Results and Treatments Labs (all labs ordered are listed, but only abnormal results are displayed) Labs Reviewed  CBC WITH DIFFERENTIAL/PLATELET - Abnormal; Notable for the following components:      Result Value   Hemoglobin 11.5 (*)    All other components within normal limits  COMPREHENSIVE METABOLIC PANEL - Abnormal; Notable for the following components:   Albumin 3.2 (*)    AST 14 (*)    All other components within normal limits  CULTURE, BLOOD (ROUTINE X 2)  CULTURE, BLOOD (ROUTINE X 2)  RESP PANEL BY RT-PCR (RSV, FLU A&B, COVID)  RVPGX2  LACTIC ACID, PLASMA  LACTIC ACID, PLASMA  URINALYSIS, ROUTINE W REFLEX MICROSCOPIC                                                                                                                          Radiology CT Head Wo Contrast  Result Date: 02/04/2022 CLINICAL DATA:  Altered mental status, initial encounter EXAM: CT HEAD WITHOUT CONTRAST TECHNIQUE: Contiguous axial images were obtained from the base of the skull through the vertex without intravenous contrast. RADIATION DOSE REDUCTION: This exam was performed according to the departmental dose-optimization program which includes automated exposure control, adjustment of the mA and/or kV according to patient size and/or use of iterative reconstruction technique. COMPARISON:  01/19/2022 FINDINGS: Brain: No evidence of acute infarction, hemorrhage, hydrocephalus, extra-axial collection or mass  lesion/mass effect. Chronic atrophic and ischemic changes are noted stable from the prior exam. Vascular: No hyperdense vessel or unexpected calcification. Skull: Normal. Negative for fracture or focal lesion. Sinuses/Orbits: No acute finding. Other: None. IMPRESSION: Chronic atrophic and ischemic changes.  No acute abnormality noted. Electronically Signed   By: Inez Catalina M.D.   On: 02/04/2022 21:52   DG Chest Portable 1 View  Result Date: 02/04/2022 CLINICAL DATA:  Fever, urinary tract infection, lethargy EXAM: PORTABLE CHEST 1 VIEW COMPARISON:  12/22/2021 FINDINGS: Single frontal view of the chest demonstrates an unremarkable cardiac silhouette. Chronic interstitial scarring without airspace disease, effusion, or pneumothorax. No displaced fractures. IMPRESSION: 1. Chronic scarring, no acute airspace disease. Electronically Signed   By: Randa Ngo M.D.   On: 02/04/2022 21:29    Pertinent labs & imaging results that were available during my care of the patient were reviewed by me and considered in my medical decision making (see MDM for details).  Medications Ordered in ED Medications  lactated ringers bolus 1,000 mL (1,000 mLs Intravenous New Bag/Given 02/04/22 2315)  Procedures .1-3 Lead EKG Interpretation  Performed by: Cristie Hem, MD Authorized by: Cristie Hem, MD     Interpretation: normal     ECG rate:  86   ECG rate assessment: normal     Rhythm: sinus rhythm     Ectopy: none     Conduction: normal     (including critical care time)  Medical Decision Making / ED Course   MDM:  88 year old female presenting with reported fever.  In the emergency department, patient afebrile.  She is well-appearing.  She has no focal exam findings.  She is oriented x 2.  Son at bedside reports she is at her baseline.  Given fever, will  check for urinary infection.  Chest x-ray without evidence of pneumonia.  No abdominal tenderness, nausea, vomiting, diarrhea to suggest intra-abdominal process.  No physical exam findings concerning for skin or soft tissue infection.  Although reportedly lethargic patient at baseline per son, CT head unremarkable.  Labs so far reassuring.  Will check COVID swab given seasonal illness.  Discussed with oncoming provider Dr. Ralene Bathe who is taking over care pending urinalysis..     Additional history obtained: -Additional history obtained from family and ems -External records from outside source obtained and reviewed including: Chart review including previous notes, labs, imaging, consultation notes including hospitalization 12/22/21   Lab Tests: -I ordered, reviewed, and interpreted labs.   The pertinent results include:   Labs Reviewed  CBC WITH DIFFERENTIAL/PLATELET - Abnormal; Notable for the following components:      Result Value   Hemoglobin 11.5 (*)    All other components within normal limits  COMPREHENSIVE METABOLIC PANEL - Abnormal; Notable for the following components:   Albumin 3.2 (*)    AST 14 (*)    All other components within normal limits  CULTURE, BLOOD (ROUTINE X 2)  CULTURE, BLOOD (ROUTINE X 2)  RESP PANEL BY RT-PCR (RSV, FLU A&B, COVID)  RVPGX2  LACTIC ACID, PLASMA  LACTIC ACID, PLASMA  URINALYSIS, ROUTINE W REFLEX MICROSCOPIC    Notable for very mild anemia  EKG   EKG Interpretation  Date/Time:  Tuesday February 04 2022 21:24:40 EST Ventricular Rate:  92 PR Interval:  176 QRS Duration: 84 QT Interval:  372 QTC Calculation: 461 R Axis:   126 Text Interpretation: Sinus rhythm Right axis deviation Probable anteroseptal infarct, old Confirmed by Garnette Gunner [phone]) on 02/04/2022 10:47:51 PM         Imaging Studies ordered: I ordered imaging studies including CXR, CT head On my interpretation imaging demonstrates no acute process I independently  visualized and interpreted imaging. I agree with the radiologist interpretation   Medicines ordered and prescription drug management: Meds ordered this encounter  Medications   lactated ringers bolus 1,000 mL    -I have reviewed the patients home medicines and have made adjustments as needed   Cardiac Monitoring: The patient was maintained on a cardiac monitor.  I personally viewed and interpreted the cardiac monitored which showed an underlying rhythm of: NSR  Social Determinants of Health:  Diagnosis or treatment significantly limited by social determinants of health: dementia   Reevaluation: After the interventions noted above, I reevaluated the patient and found that they have improved  Co morbidities that complicate the patient evaluation  Past Medical History:  Diagnosis Date   Hypercholesterolemia    Hypertension    Hypothyroidism    Osteoarthritis    Reactive depression (situational)    Stroke ([place])    TIA (  transient ischemic attack)    on plavix      Dispostion: Disposition decision including need for hospitalization was considered, and patient disposition pending at time of sign out    Final Clinical Impression(s) / ED Diagnoses Final diagnoses:  Fever, unspecified fever cause     This chart was dictated using voice recognition software.  Despite best efforts to proofread,  errors can occur which can change the documentation meaning.    Cristie Hem, MD 02/04/22 [phone]

## 2022-02-04 NOTE — ED Triage Notes (Addendum)
Pt BIB EMS with reports of fever (100.4 F) and possible UTI. Pt is more lethargic than usual. Pt is altered at baseline.

## 2022-02-05 LAB — URINALYSIS, ROUTINE W REFLEX MICROSCOPIC
Bilirubin Urine: NEGATIVE
Glucose, UA: NEGATIVE mg/dL
Hgb urine dipstick: NEGATIVE
Ketones, ur: 5 mg/dL — AB
Leukocytes,Ua: NEGATIVE
Nitrite: NEGATIVE
Protein, ur: NEGATIVE mg/dL
Specific Gravity, Urine: 1.014 (ref 1.005–1.030)
pH: 6 (ref 5.0–8.0)

## 2022-02-05 LAB — RESP PANEL BY RT-PCR (RSV, FLU A&B, COVID)  RVPGX2
Influenza A by PCR: NEGATIVE
Influenza B by PCR: NEGATIVE
Resp Syncytial Virus by PCR: NEGATIVE
SARS Coronavirus 2 by RT PCR: NEGATIVE

## 2022-02-05 LAB — LACTIC ACID, PLASMA: Lactic Acid, Venous: 1.1 mmol/L (ref 0.5–1.9)

## 2022-02-05 NOTE — ED Provider Notes (Signed)
Care assumed at 2330.  Patient here for evaluation of low-grade temperature, no complaints at this time.  Care assumed pending urinalysis and respiratory viral panel.  UA not consistent with UTI, viral panel is negative for acute abnormality.  Patient is afebrile during her ED stay.  On assessment at the bedside patient is asymptomatic.  Discussed with patient's son over the phone unclear source of her fever at her facility but she appears to be asymptomatic at this time.  Feel that she is stable for discharge home with outpatient resources and return precautions.   Quintella Reichert, MD 02/05/22 [phone]

## 2022-02-06 LAB — URINE CULTURE: Culture: NO GROWTH

## 2022-02-09 LAB — CULTURE, BLOOD (ROUTINE X 2): Culture: NO GROWTH

## 2022-02-09 IMAGING — CT CT ANGIO CHEST
2 of 7 series · 17 of 46 positions shown · IV contrast (iopamidol)
Comparison: No prior chest CT for comparison. Last abdomen and
pelvis CT is available for comparison, dated 01/11/2005.

CLINICAL DATA: Suspected pulmonary embolism. Recent fall injury as
well.

EXAM:
CT ANGIOGRAPHY CHEST WITH CONTRAST
TECHNIQUE: Multidetector CT imaging of the chest was performed using the
standard protocol during bolus administration of intravenous
contrast. Multiplanar CT image reconstructions and MIPs were
obtained to evaluate the vascular anatomy.
CONTRAST:  60mL DW4E02-M2A IOPAMIDOL (DW4E02-M2A) INJECTION 76%

[Series 7: thins · axial · 0.66mm/px · z∈[+1126,+1344]mm · 14 of 351 slices shown]
[im 20/351  lung]
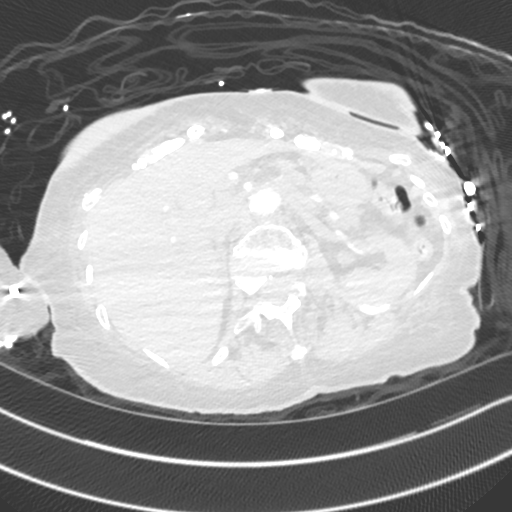
[im 39/351  soft-tissue]
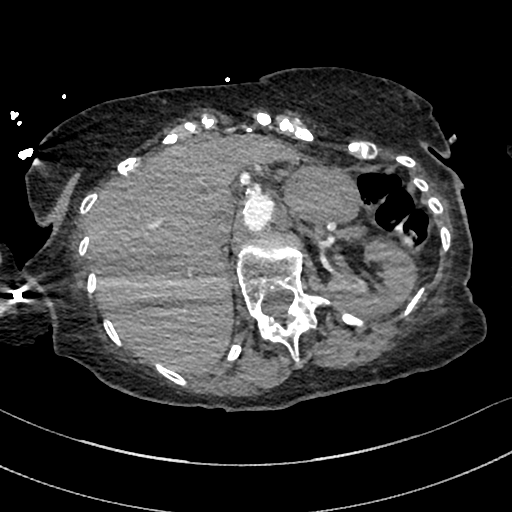
[im 78/351  lung]
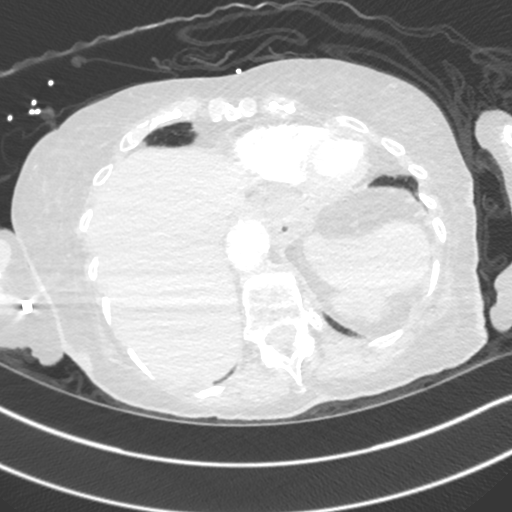
[im 98/351  soft-tissue]
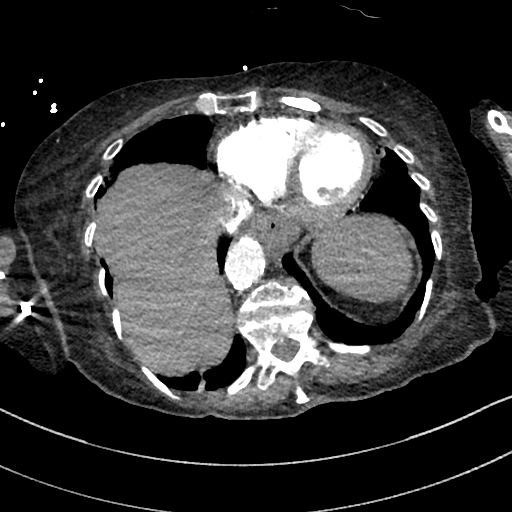
[im 117/351  lung]
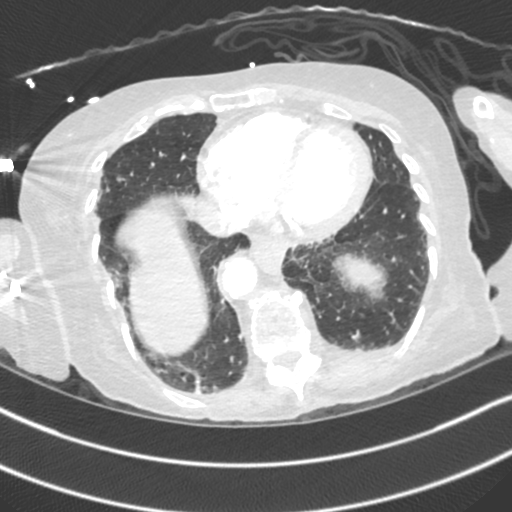
[im 137/351  soft-tissue]
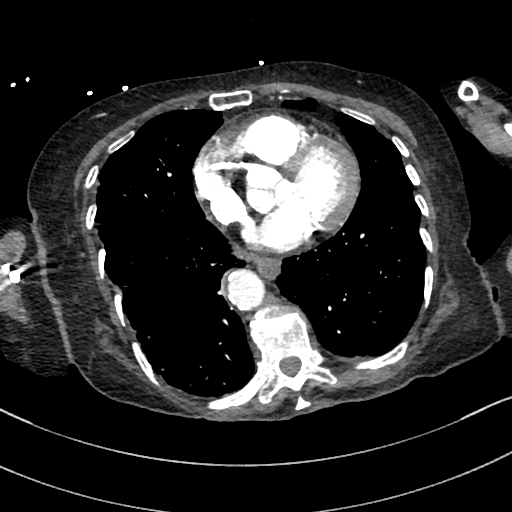
[im 156/351  lung]
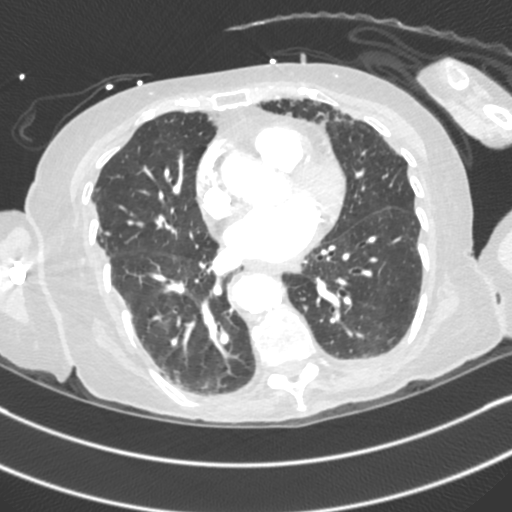
[im 195/351  soft-tissue]
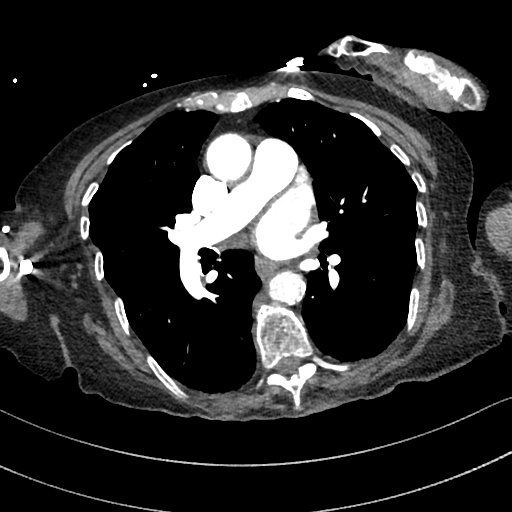
[im 214/351  lung]
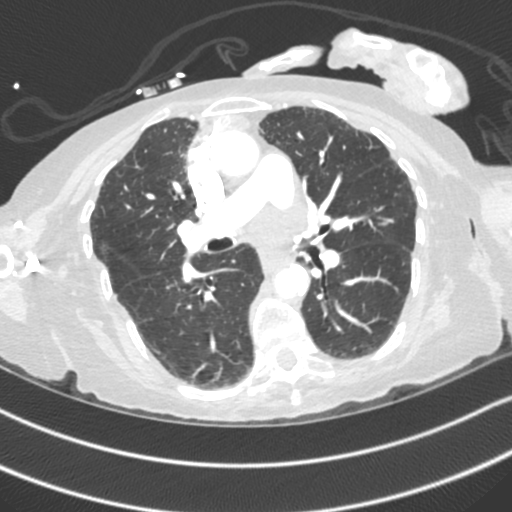
[im 234/351  soft-tissue]
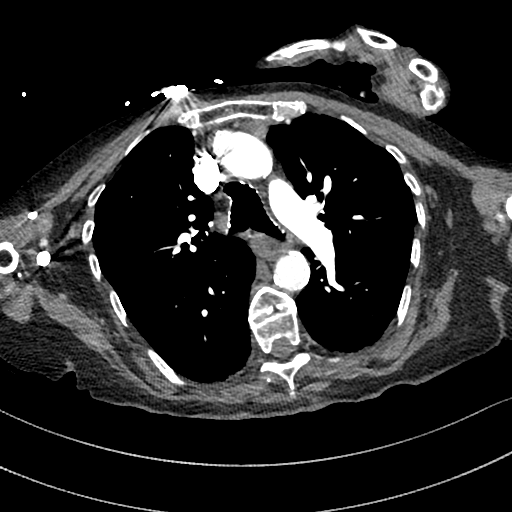
[im 253/351  lung]
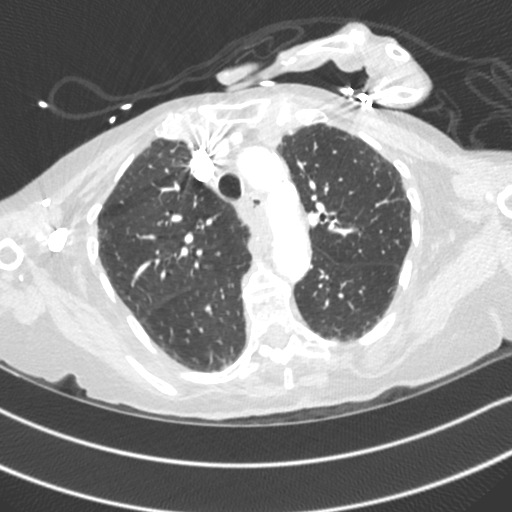
[im 273/351  soft-tissue]
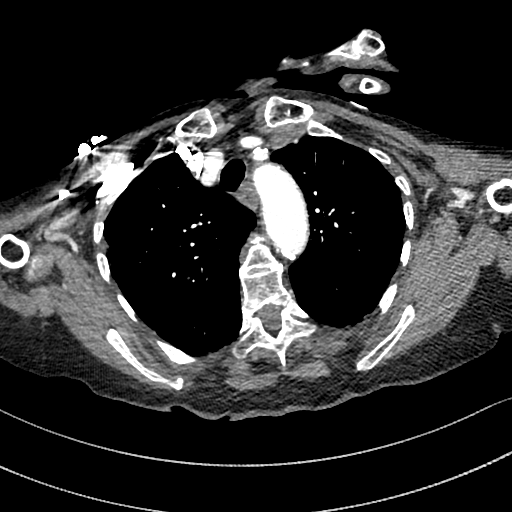
[im 312/351  lung]
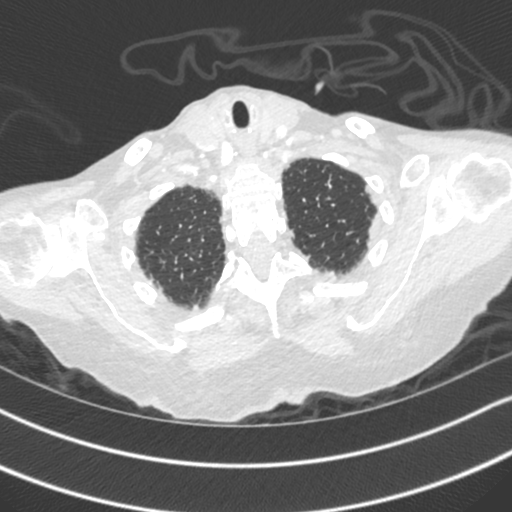
[im 331/351  soft-tissue]
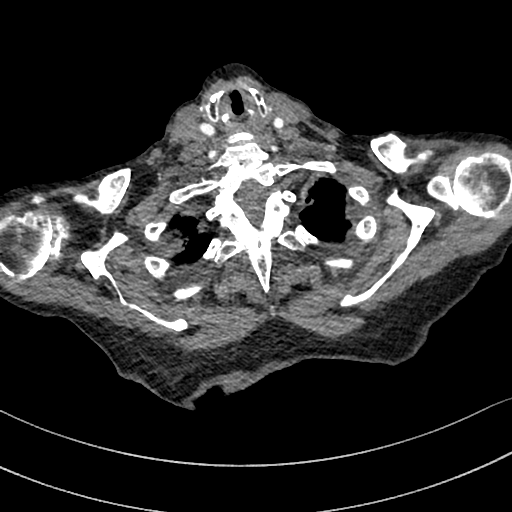

[Series 8: cor · coronal · 0.57mm/px · 3 of 129 slices shown]
[im 33/129  soft-tissue]
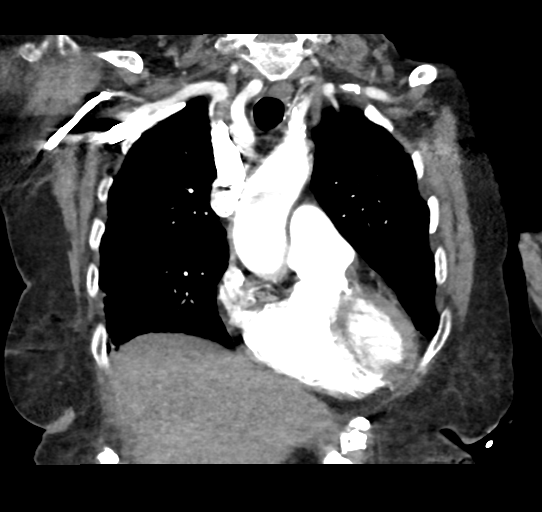
[im 65/129  soft-tissue]
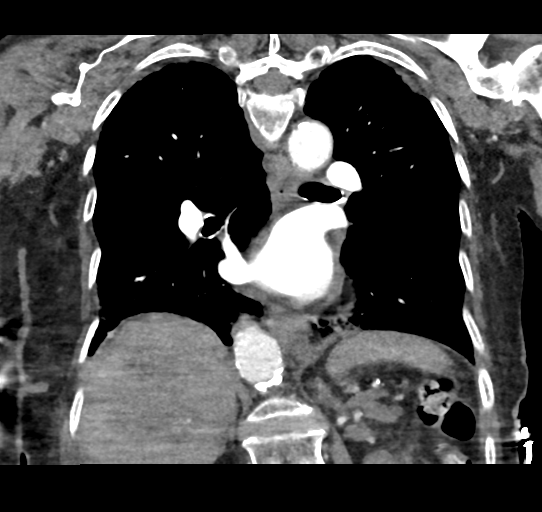
[im 97/129  soft-tissue]
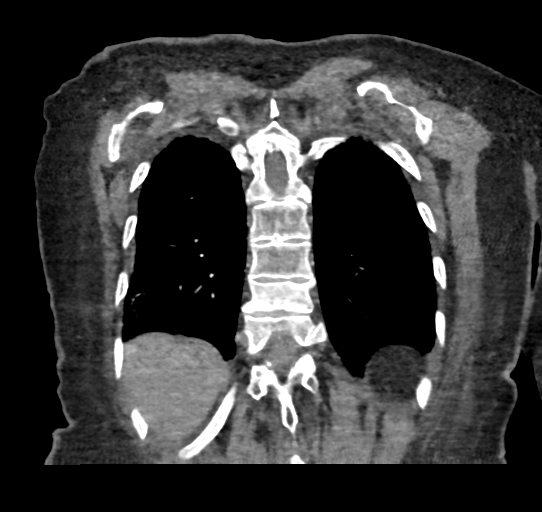

[17 of 46 positions shown; findings below may reference images not displayed]

FINDINGS: Cardiovascular: The heart is slightly enlarged and there is a small
pericardial effusion in the superior recess. The coronary arteries
are increasingly heavily calcified and there is calcification in the
posterior mitral ring.

Pulmonary arteries are normal in caliber without evidence of
thromboemboli. There are mildly distended superior pulmonary veins.
There is no aortic aneurysm or dissection, there is moderate to
heavy patchy mixed plaque in the arch and descending segments with
moderate descending segment tortuosity and with scattered ulcerative
plaque in the descending segment.

Mediastinum/Nodes: Heterogeneous 1.2 cm nodule in the left lobe of
the thyroid gland and subcentimeter hypodense nodule in the right
lobe. There are mildly prominent lymph nodes in the subcarinal
mediastinum to the right up to 1.3 cm short axis. There is a small
hiatal hernia and a moderately thickened distal thoracic esophagus,
mild patulous appearance of the upper to the midthoracic esophagus.

Lungs/Pleura: There are bilateral minimal layering pleural
effusions. This was seen on prior study as well. There are
subpleural chronic reticulated interstitial changes of lungs at all
levels but with a basal predominance, consistent with UIP fibrosis
pattern without honeycombing.

Lungs are hyperinflated without evidence of nodule or consolidated
infiltrate but there may be slight interstitial edema in the extreme
lung bases. The trachea and central airways are clear.

Upper Abdomen: No acute abnormality. Visualized abdominal aorta,
proximal celiac artery and proximal SMA are heavily calcified.

Musculoskeletal: There is pronounced thoracic kyphosis. There is
osteopenia. There is a severe anterior wedge compression fracture of
the T10 vertebral body with chronic appearance, mild wedging of the
T8, T9, and T11 vertebrae, without retropulsion and with
degenerative changes of the spine and shoulders. There is no
displaced rib or sternal fracture or chest wall hematoma.

Review of the MIP images confirms the above findings.
IMPRESSION: 1. Cardiomegaly with mildly distended superior pulmonary veins and
questionable slight interstitial edema in the base of the lungs with
minimal pleural effusions.
2. No evidence of pulmonary arterial embolism or arterial
dilatation.
3. Three-vessel calcific CAD, small pericardial effusion, and
moderate to heavy aortic mixed plaques with ulcerative plaque of the
descending segment.
4. Basal predominant subpleural interstitial reticulation.
5. 1.2 cm left thyroid nodule.  No imaging follow-up is required.
6. Small hiatal hernia with increasingly thickened distal thoracic
esophagus. Consider endoscopic follow-up.
7. Mildly prominent subcarinal lymph nodes the right but no bulky or
encasing adenopathy or further significant lymph nodes.
8. Severe thoracic kyphosis, osteopenia and degenerative change, and
chronic appearing compression fractures in lower thoracic spine
greatest at T10. This was not seen on the prior abdomen pelvis CT.

## 2022-02-10 LAB — CULTURE, BLOOD (ROUTINE X 2)
Culture: NO GROWTH
Special Requests: ADEQUATE

## 2022-02-12 ENCOUNTER — Other Ambulatory Visit: Payer: Self-pay

## 2022-02-12 ENCOUNTER — Emergency Department (HOSPITAL_COMMUNITY)
Admission: EM | Admit: 2022-02-12 | Discharge: 2022-02-12 | Disposition: A | Discharge status: Skilled Nursing Facility | Payer: Medicare HMO | Attending: Emergency Medicine | Admitting: Emergency Medicine

## 2022-02-12 ENCOUNTER — Emergency Department (HOSPITAL_COMMUNITY): Payer: Medicare HMO

## 2022-02-12 DIAGNOSIS — Z7902 Long term (current) use of antithrombotics/antiplatelets: Secondary | ICD-10-CM | POA: Insufficient documentation

## 2022-02-12 DIAGNOSIS — R0989 Other specified symptoms and signs involving the circulatory and respiratory systems: Secondary | ICD-10-CM | POA: Diagnosis not present

## 2022-02-12 DIAGNOSIS — Z79899 Other long term (current) drug therapy: Secondary | ICD-10-CM | POA: Diagnosis not present

## 2022-02-12 DIAGNOSIS — Z1152 Encounter for screening for COVID-19: Secondary | ICD-10-CM | POA: Diagnosis not present

## 2022-02-12 DIAGNOSIS — T17308A Unspecified foreign body in larynx causing other injury, initial encounter: Secondary | ICD-10-CM

## 2022-02-12 DIAGNOSIS — R058 Other specified cough: Secondary | ICD-10-CM | POA: Diagnosis not present

## 2022-02-12 LAB — RESP PANEL BY RT-PCR (RSV, FLU A&B, COVID)  RVPGX2
Influenza A by PCR: NEGATIVE
Influenza B by PCR: NEGATIVE
Resp Syncytial Virus by PCR: NEGATIVE
SARS Coronavirus 2 by RT PCR: NEGATIVE

## 2022-02-12 NOTE — Discharge Instructions (Addendum)
Call her primary doctor to help set up a speech therapy consultation and swallow study at her facility.  Otherwise, return to the ER or see her doctor if she develops fever, coughing up blood, trouble breathing, recurrent difficulty swallowing/choking, or any other new/concerning symptoms.

## 2022-02-12 NOTE — ED Notes (Signed)
[facility] notified of need for transport. 

## 2022-02-12 NOTE — ED Triage Notes (Signed)
Pt BIB [facility] from [facility] for choking/aspiration. Pt took AM meds at 8AM, was found 2 hours later coughing and drooling. Pt reportedly coughed up one pill. 110 HR 97% r/a 122/61 BP

## 2022-02-12 NOTE — ED Provider Notes (Signed)
[facility] DEPT Provider Note   CSN: [id number] Arrival date & time: 02/12/22  1119     History  Chief Complaint  Patient presents with   Choking    Danielle Bryan is a 88 y.o. female.  HPI 88 year old female present from her facility after choking.  History is from EMS.  Patient was given her pills this morning and when staff checked on her again she had some drool coming out of her mouth and coughed up at least one of her pills.  She seems like she has a raspy or voice than normal according to EMS.  O2 sats were okay and patient had no complaints or increased work of breathing.  I discussed with Tax inspector, [place].  States that the med tech went into the room and got her because the patient was coughing up phlegm.  She questions whether or not the medicine was crushed up enough as normally it has to be crushed for her to take it.  She seemed to be gargling and was hard to understand/could not speak.  Normally she can speak as long as she can understand what people are saying, has a chronic hard time hearing.  Normally her pills are crushed and her food is chopped/cut up but otherwise she is able to tolerate oral food and liquids at baseline.  Previous today she has been healthy and not showing any signs of an acute illness.  Home Medications Prior to Admission medications   Medication Sig Start Date End Date Taking? Authorizing Provider  acetaminophen (TYLENOL) 500 MG tablet Take 1,000 mg by mouth in the morning, at noon, and at bedtime.    [provider]  amLODipine (NORVASC) 10 MG tablet Take 1 tablet (10 mg total) by mouth daily. 03/27/21   Shelly Coss, MD  benzonatate (TESSALON) 200 MG capsule Take 1 capsule (200 mg total) by mouth 3 (three) times daily as needed for cough. Patient taking differently: Take 200 mg by mouth every 8 (eight) hours as needed for cough. 03/26/21   Shelly Coss, MD  clopidogrel (PLAVIX) 75 MG tablet Take 1  tablet (75 mg total) by mouth daily. Patient taking differently: Take 75 mg by mouth every morning. 03/21/14   Velna Hatchet, MD  divalproex (DEPAKOTE ER) 500 MG 24 hr tablet TAKE 1 TABLET BY MOUTH EVERY DAY Patient taking differently: Take 500 mg by mouth daily. 04/19/20   Frann Rider, NP  levothyroxine (SYNTHROID) 100 MCG tablet Take 1 tablet (100 mcg total) by mouth See admin instructions. Take one tablet (50 mcg) by mouth 6 days weekly in the morning, skip Sundays Patient not taking: Reported on 12/23/2021 03/26/21   Shelly Coss, MD  levothyroxine (SYNTHROID) 112 MCG tablet Take 112 mcg by mouth daily before breakfast.    [provider]  Menthol, Topical Analgesic, (BIOFREEZE) 4 % GEL Apply 1 Application topically every 12 (twelve) hours as needed (shoulder pain).    [provider]  pantoprazole (PROTONIX) 40 MG tablet Take 40 mg by mouth at bedtime.    [provider]  rosuvastatin (CRESTOR) 10 MG tablet Take 10 mg by mouth at bedtime.    [provider]  sertraline (ZOLOFT) 50 MG tablet Take 1 tablet (50 mg total) by mouth daily. 06/28/20   Frann Rider, NP  vitamin E 180 MG (400 UNITS) capsule Take 400 Units by mouth at bedtime.    [provider]      Allergies    Escitalopram oxalate,  Celexa [citalopram], Lisinopril, Statins, and Sulfa antibiotics    Review of Systems   Review of Systems  Unable to perform ROS: Other (very hard of hearing)    Physical Exam Updated Vital Signs BP 126/66   Pulse 100   Temp 98 F (36.7 C) (Oral)   Resp 16   SpO2 93%  Physical Exam Vitals and nursing note reviewed.  Constitutional:      Appearance: She is well-developed. She is not diaphoretic.  HENT:     Head: Normocephalic and atraumatic.     Mouth/Throat:     Comments: There is some mucous in posterior oropharynx Cardiovascular:     Rate and Rhythm: Normal rate and regular rhythm.     Heart sounds: Normal heart sounds.  Pulmonary:      Effort: Pulmonary effort is normal.     Breath sounds: Normal breath sounds. No stridor.  Abdominal:     Palpations: Abdomen is soft.     Tenderness: There is no abdominal tenderness.  Skin:    General: Skin is warm and dry.  Neurological:     Mental Status: She is alert.     ED Results / Procedures / Treatments   Labs (all labs ordered are listed, but only abnormal results are displayed) Labs Reviewed  RESP PANEL BY RT-PCR (RSV, FLU A&B, COVID)  [id number]    EKG None  Radiology DG Chest Portable 1 View  Result Date: 02/12/2022 CLINICAL DATA:  Choking EXAM: PORTABLE CHEST 1 VIEW COMPARISON:  02/04/2022 FINDINGS: The heart size and mediastinal contours are within normal limits. Unchanged mild, diffuse bilateral interstitial pulmonary opacity. The visualized skeletal structures are unremarkable. IMPRESSION: Unchanged mild, diffuse bilateral interstitial pulmonary opacity, which may reflect edema and/or chronic interstitial change. No new or focal airspace opacity. Electronically Signed   By: Delanna Ahmadi M.D.   On: 02/12/2022 11:51    Procedures Procedures    Medications Ordered in ED Medications - No data to display  ED Course/ Medical Decision Making/ A&P                           Medical Decision Making Amount and/or Complexity of Data Reviewed Independent Historian: caregiver and EMS External Data Reviewed: notes. Labs: ordered.    Details: COVID/flu/RSV testing negative Radiology: ordered and independent interpretation performed.    Details: No pneumonia/infiltrate   Patient had a good amount of secretions suctioned out by nursing staff.  After this, patient's airway has remained patent and no difficulty breathing.  No hypoxia.  Sats are in the 96 range when I was most recently examining her and discussing with son at the bedside.  He is concerned because he feels like she has been having some trouble swallowing for a while.  He would like her to get a swallow  study.  I discussed this is really an ED test and I do not think she needs admission for this as she was able to tolerate p.o. fluids here.  However I will try and order this as an outpatient encouraged him to discuss with the facility doctor about getting a speech therapist consultation.  Otherwise, no infiltrate on x-ray and I do not think after 1 episode that she needs to be put on immediate antibiotics though may need antibiotics if she spikes a fever or clinically gets worse.  Otherwise, she appears stable for discharge back to her facility.        Final Clinical Impression(s) /  ED Diagnoses Final diagnoses:  Choking, initial encounter    Rx / DC Orders ED Discharge Orders          Ordered    SLP eval and treat        02/12/22 1307    DG SWALLOW FUNC OP MEDICARE SPEECH PATH        02/12/22 1307              Sherwood Gambler, MD 02/12/22 1328

## 2022-02-12 NOTE — ED Notes (Signed)
Pt took 2 sips of water done well

## 2022-02-13 ENCOUNTER — Telehealth (HOSPITAL_COMMUNITY): Payer: Self-pay | Admitting: Emergency Medicine

## 2022-02-13 NOTE — Telephone Encounter (Signed)
I was asked by radiology to modify the swallow study order.

## 2022-02-14 ENCOUNTER — Telehealth (HOSPITAL_COMMUNITY): Payer: Self-pay

## 2022-02-14 NOTE — Telephone Encounter (Signed)
Attempted to contact patient to schedule Modified Barium Swallow - left voicemail. 

## 2022-03-14 ENCOUNTER — Encounter (HOSPITAL_COMMUNITY): Payer: Medicare HMO

## 2022-03-14 ENCOUNTER — Ambulatory Visit (HOSPITAL_COMMUNITY): Payer: Medicare HMO

## 2022-03-27 DEATH — deceased

## 2022-04-18 IMAGING — DX DG CHEST 1V PORT
1 series · 1 of 1 positions shown · non-contrast
Comparison: 01/12/2021

CLINICAL DATA: concern for pna after recent [id number] infection

EXAM:
PORTABLE CHEST 1 VIEW

[chest ap]
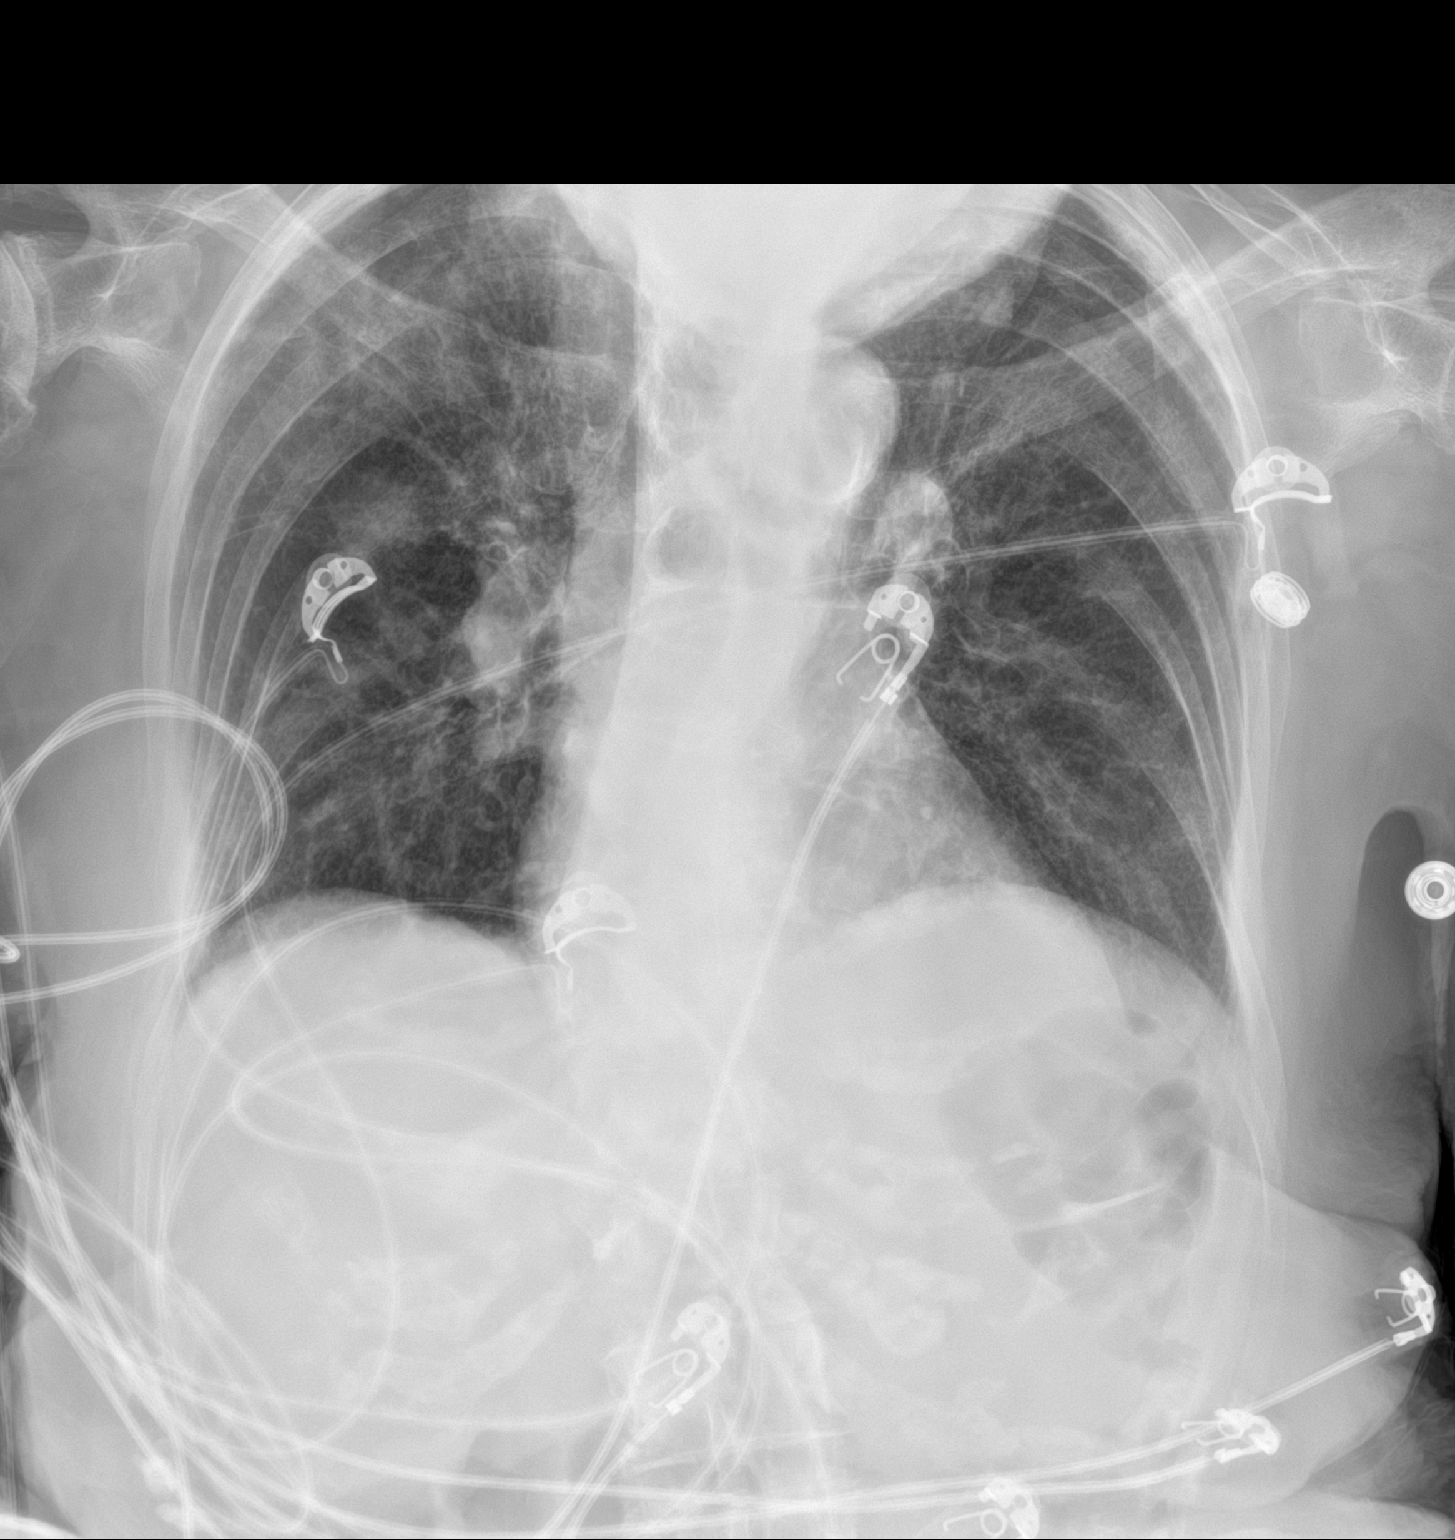

[1 of 1 positions shown; findings below may reference images not displayed]

FINDINGS: Heart size within normal limits. Aortic atherosclerosis. New focal
opacity within the right upper lobe. Left lung appears clear. No
definite pleural fluid collection. No pneumothorax. Exaggerated
thoracic kyphosis.
IMPRESSION: New focal opacity within the right upper lobe suspicious for
pneumonia. Radiographic follow-up to resolution is recommended.

## 2022-04-18 IMAGING — CT CT HEAD W/O CM
3 series · 16 of 37 positions shown, 18 images · non-contrast
Comparison: January 12, 2021.

CLINICAL DATA: Altered mental status.



[Series 3: head wo · axial · 0.41mm/px · z∈[-376,-246]mm · 7 of 36 slices shown, 9 images]
[im 5/36  brain]
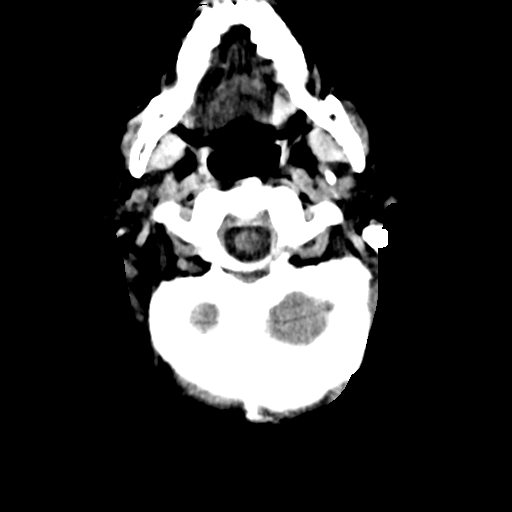
[im 5/36  bone]
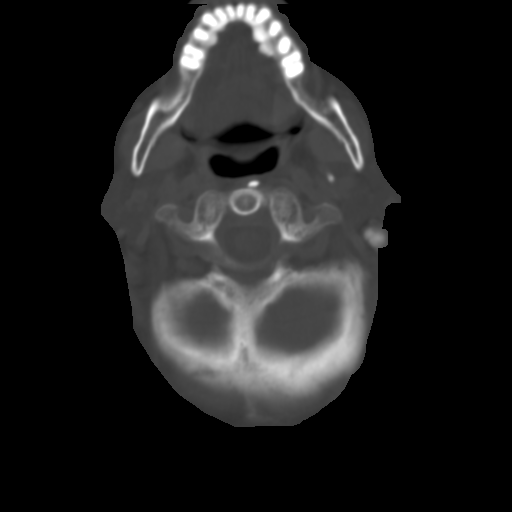
[im 9/36  brain]
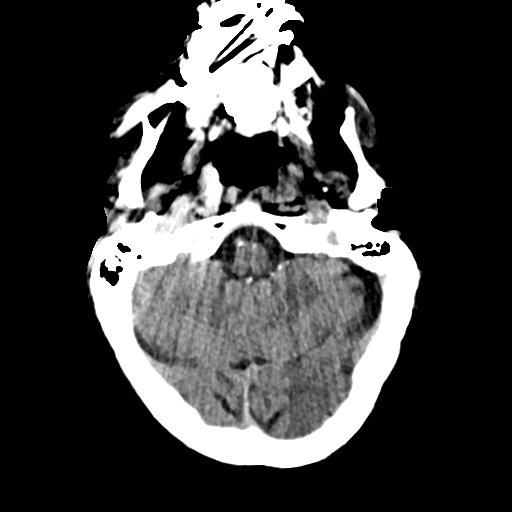
[im 14/36  brain]
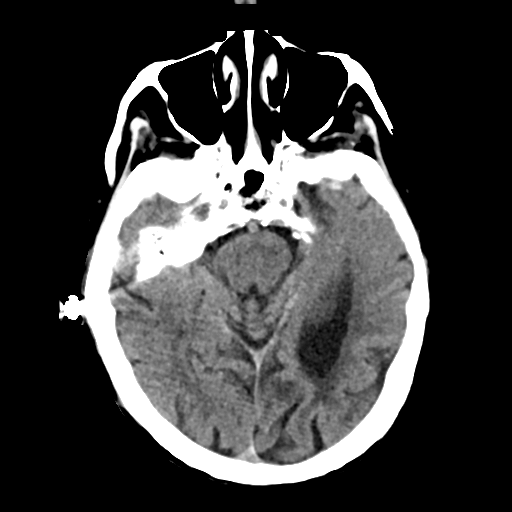
[im 18/36  brain]
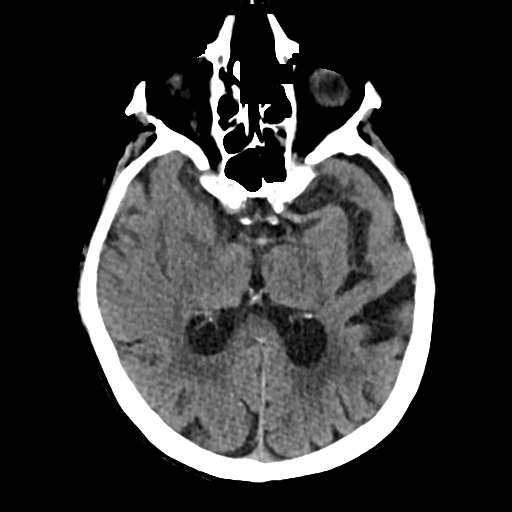
[im 22/36  brain]
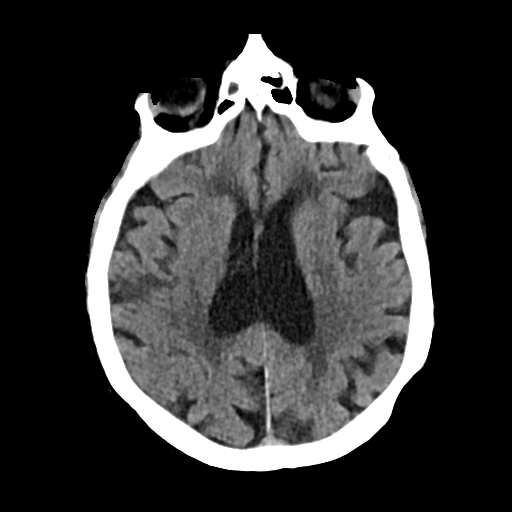
[im 22/36  bone]
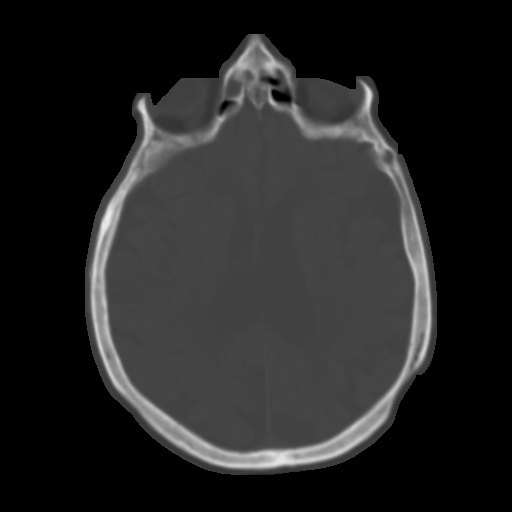
[im 27/36  brain]
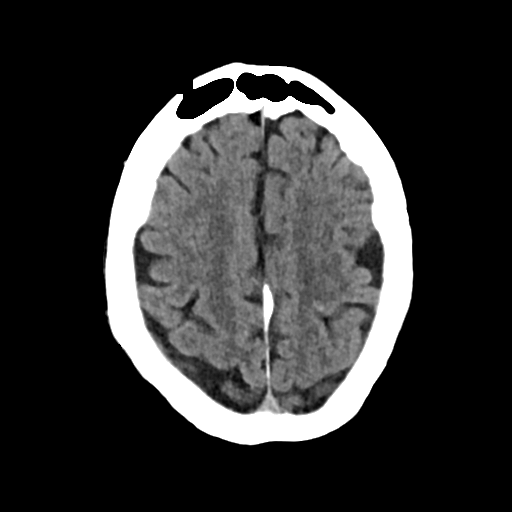
[im 31/36  brain]
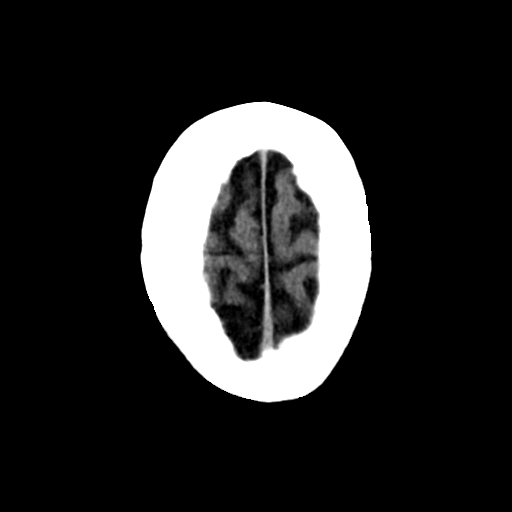

[Series 4: head bone · axial · 0.41mm/px · z∈[-380,-274]mm · 6 of 89 slices shown]
[im 9/89  bone]
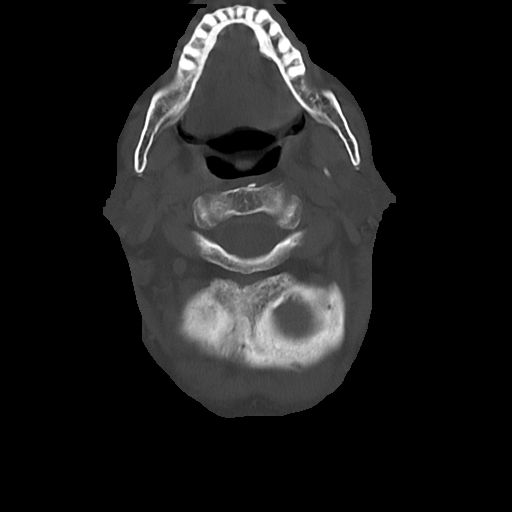
[im 18/89  bone]
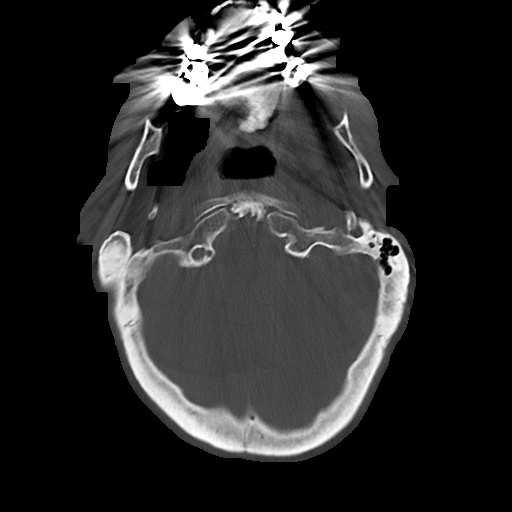
[im 27/89  bone]
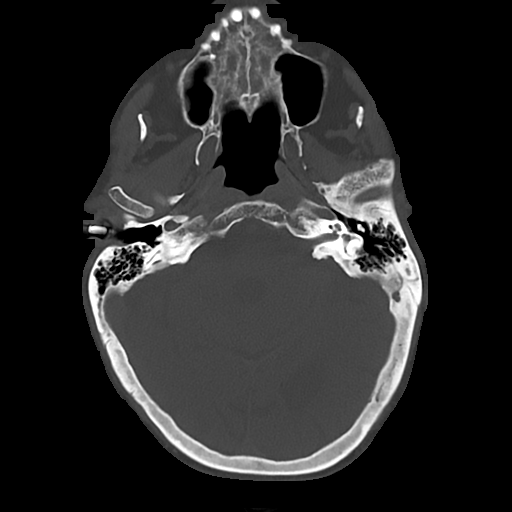
[im 40/89  bone]
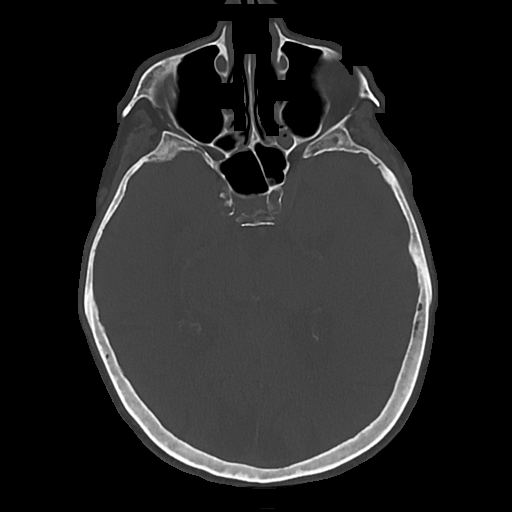
[im 49/89  bone]
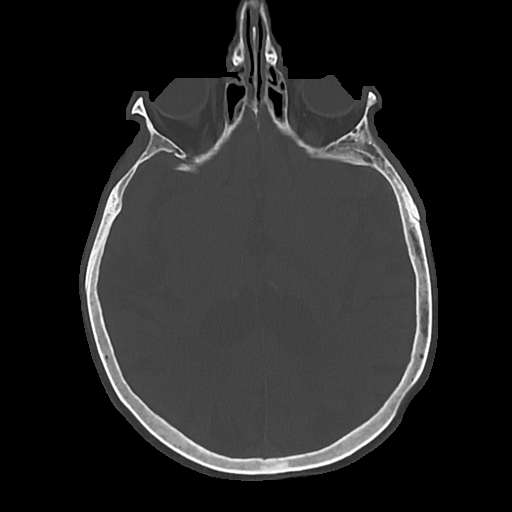
[im 62/89  bone]
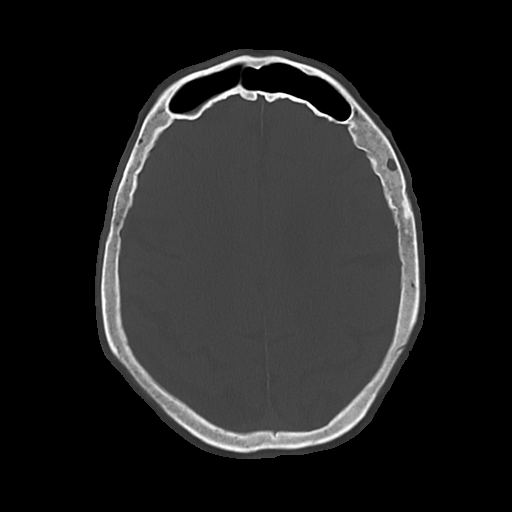

[Series 6: sag soft · sagittal · 0.28mm/px · 3 of 59 slices shown]
[im 20/59  brain]
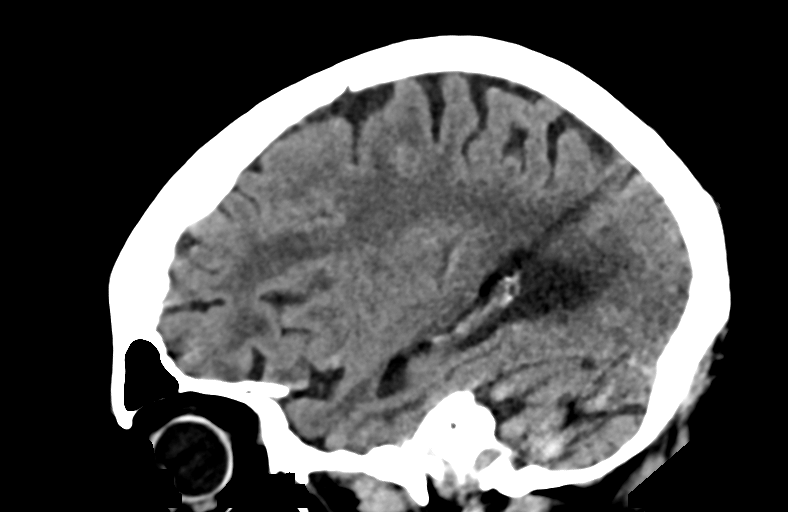
[im 30/59  brain]
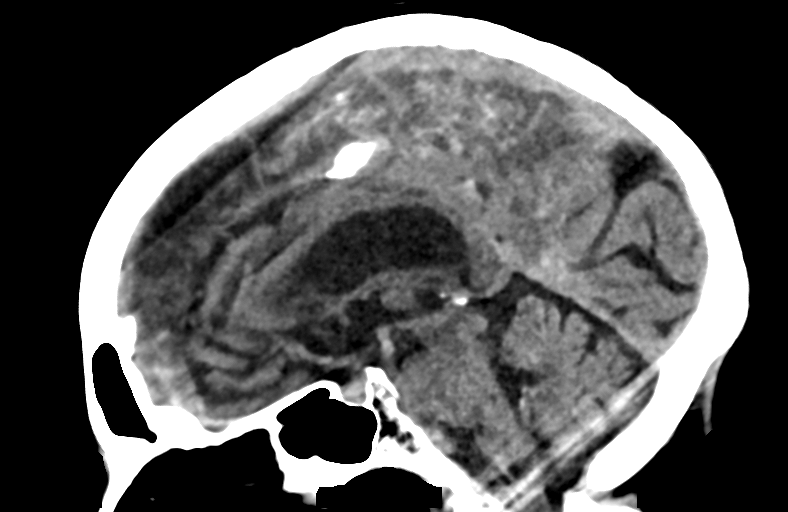
[im 39/59  brain]
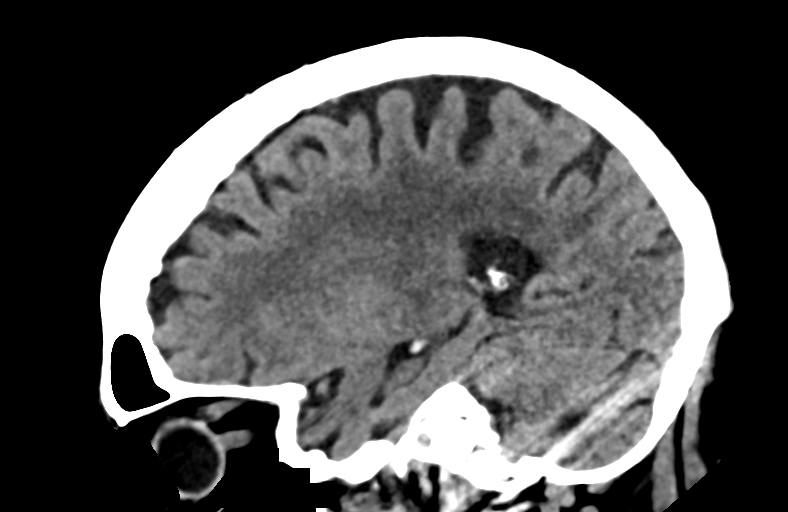

[16 of 37 positions shown; findings below may reference images not displayed]

FINDINGS: Brain: Mild chronic ischemic white matter disease is noted. No mass
effect or midline shift is noted. Ventricular size is within normal
limits. There is no evidence of mass lesion, hemorrhage or acute
infarction.

Vascular: No hyperdense vessel or unexpected calcification.

Skull: Normal. Negative for fracture or focal lesion.

Sinuses/Orbits: No acute finding.

Other: None.
IMPRESSION: No acute intracranial abnormality seen.
